# Patient Record
Sex: Male | Born: 1950 | Race: White | Hispanic: No | Marital: Single | Smoking: Never smoker
Health system: Southern US, Community
[De-identification: ages and names within clinical notes are randomized; demographics above are authoritative.]

## PROBLEM LIST (undated history)

## (undated) DIAGNOSIS — E7800 Pure hypercholesterolemia, unspecified: Secondary | ICD-10-CM

## (undated) DIAGNOSIS — K645 Perianal venous thrombosis: Secondary | ICD-10-CM

## (undated) DIAGNOSIS — Z8781 Personal history of (healed) traumatic fracture: Secondary | ICD-10-CM

## (undated) DIAGNOSIS — M810 Age-related osteoporosis without current pathological fracture: Secondary | ICD-10-CM

## (undated) DIAGNOSIS — D126 Benign neoplasm of colon, unspecified: Secondary | ICD-10-CM

## (undated) DIAGNOSIS — N200 Calculus of kidney: Secondary | ICD-10-CM

## (undated) DIAGNOSIS — R011 Cardiac murmur, unspecified: Secondary | ICD-10-CM

## (undated) DIAGNOSIS — H269 Unspecified cataract: Secondary | ICD-10-CM

## (undated) DIAGNOSIS — I509 Heart failure, unspecified: Secondary | ICD-10-CM

## (undated) DIAGNOSIS — I350 Nonrheumatic aortic (valve) stenosis: Secondary | ICD-10-CM

## (undated) DIAGNOSIS — I4510 Unspecified right bundle-branch block: Secondary | ICD-10-CM

## (undated) DIAGNOSIS — E785 Hyperlipidemia, unspecified: Secondary | ICD-10-CM

## (undated) DIAGNOSIS — J40 Bronchitis, not specified as acute or chronic: Secondary | ICD-10-CM

## (undated) DIAGNOSIS — N400 Enlarged prostate without lower urinary tract symptoms: Secondary | ICD-10-CM

## (undated) DIAGNOSIS — K635 Polyp of colon: Secondary | ICD-10-CM

## (undated) DIAGNOSIS — H903 Sensorineural hearing loss, bilateral: Secondary | ICD-10-CM

## (undated) DIAGNOSIS — E559 Vitamin D deficiency, unspecified: Secondary | ICD-10-CM

## (undated) HISTORY — DX: Unspecified cataract: H26.9

## (undated) HISTORY — PX: POLYPECTOMY: SHX149

## (undated) HISTORY — DX: Perianal venous thrombosis: K64.5

## (undated) HISTORY — DX: Nonrheumatic aortic (valve) stenosis: I35.0

## (undated) HISTORY — DX: Pure hypercholesterolemia, unspecified: E78.00

## (undated) HISTORY — DX: Benign neoplasm of colon, unspecified: D12.6

## (undated) HISTORY — DX: Vitamin D deficiency, unspecified: E55.9

## (undated) HISTORY — DX: Hyperlipidemia, unspecified: E78.5

## (undated) HISTORY — PX: TESTICLE SURGERY: SHX794

## (undated) HISTORY — DX: Benign prostatic hyperplasia without lower urinary tract symptoms: N40.0

## (undated) HISTORY — PX: HERNIA REPAIR: SHX51

## (undated) HISTORY — DX: Unspecified right bundle-branch block: I45.10

## (undated) HISTORY — DX: Sensorineural hearing loss, bilateral: H90.3

## (undated) HISTORY — DX: Polyp of colon: K63.5

## (undated) HISTORY — PX: CARDIAC CATHETERIZATION: SHX172

## (undated) HISTORY — DX: Personal history of (healed) traumatic fracture: Z87.81

## (undated) HISTORY — PX: COLONOSCOPY: SHX174

---

## 1989-01-24 DIAGNOSIS — M8580 Other specified disorders of bone density and structure, unspecified site: Secondary | ICD-10-CM | POA: Insufficient documentation

## 2004-03-25 HISTORY — PX: COLON SURGERY: SHX602

## 2012-01-28 ENCOUNTER — Emergency Department (HOSPITAL_BASED_OUTPATIENT_CLINIC_OR_DEPARTMENT_OTHER)
Admission: EM | Admit: 2012-01-28 | Discharge: 2012-01-28 | Disposition: A | Discharge status: Home / Self Care | Payer: PRIVATE HEALTH INSURANCE | Attending: Emergency Medicine | Admitting: Emergency Medicine

## 2012-01-28 ENCOUNTER — Emergency Department (HOSPITAL_BASED_OUTPATIENT_CLINIC_OR_DEPARTMENT_OTHER): Payer: PRIVATE HEALTH INSURANCE

## 2012-01-28 ENCOUNTER — Encounter (HOSPITAL_BASED_OUTPATIENT_CLINIC_OR_DEPARTMENT_OTHER): Payer: Self-pay | Admitting: Emergency Medicine

## 2012-01-28 DIAGNOSIS — Y9389 Activity, other specified: Secondary | ICD-10-CM | POA: Insufficient documentation

## 2012-01-28 DIAGNOSIS — Z87442 Personal history of urinary calculi: Secondary | ICD-10-CM | POA: Insufficient documentation

## 2012-01-28 DIAGNOSIS — Z79899 Other long term (current) drug therapy: Secondary | ICD-10-CM | POA: Insufficient documentation

## 2012-01-28 DIAGNOSIS — Y929 Unspecified place or not applicable: Secondary | ICD-10-CM | POA: Insufficient documentation

## 2012-01-28 DIAGNOSIS — X500XXA Overexertion from strenuous movement or load, initial encounter: Secondary | ICD-10-CM | POA: Insufficient documentation

## 2012-01-28 DIAGNOSIS — IMO0001 Reserved for inherently not codable concepts without codable children: Secondary | ICD-10-CM | POA: Insufficient documentation

## 2012-01-28 DIAGNOSIS — M7918 Myalgia, other site: Secondary | ICD-10-CM

## 2012-01-28 HISTORY — DX: Age-related osteoporosis without current pathological fracture: M81.0

## 2012-01-28 HISTORY — DX: Bronchitis, not specified as acute or chronic: J40

## 2012-01-28 HISTORY — DX: Calculus of kidney: N20.0

## 2012-01-28 LAB — BASIC METABOLIC PANEL
BUN: 16 mg/dL (ref 6–23)
CO2: 30 mEq/L (ref 19–32)
Calcium: 9.4 mg/dL (ref 8.4–10.5)
Chloride: 100 mEq/L (ref 96–112)
Creatinine, Ser: 0.8 mg/dL (ref 0.50–1.35)
GFR calc Af Amer: >90 mL/min (ref >90)
GFR calc non Af Amer: >90 mL/min (ref >90)
Glucose, Bld: 108 mg/dL — ABNORMAL HIGH (ref 70–99)
Potassium: 3.9 mEq/L (ref 3.5–5.1)
Sodium: 138 mEq/L (ref 135–145)

## 2012-01-28 LAB — URINALYSIS, ROUTINE W REFLEX MICROSCOPIC
Bilirubin Urine: NEGATIVE
Glucose, UA: NEGATIVE mg/dL
Hgb urine dipstick: NEGATIVE
Ketones, ur: NEGATIVE mg/dL
Leukocytes, UA: NEGATIVE
Nitrite: NEGATIVE
Protein, ur: NEGATIVE mg/dL
Specific Gravity, Urine: 1.009 (ref 1.005–1.030)
Urobilinogen, UA: 0.2 mg/dL (ref 0.0–1.0)
pH: 6.5 (ref 5.0–8.0)

## 2012-01-28 LAB — CBC WITH DIFFERENTIAL/PLATELET
Basophils Absolute: 0 10*3/uL (ref 0.0–0.1)
Basophils Relative: 0 % (ref 0–1)
Eosinophils Absolute: 0.2 10*3/uL (ref 0.0–0.7)
Eosinophils Relative: 3 % (ref 0–5)
HCT: 38.9 % — ABNORMAL LOW (ref 39.0–52.0)
Hemoglobin: 13.6 g/dL (ref 13.0–17.0)
Lymphocytes Relative: 21 % (ref 12–46)
Lymphs Abs: 1.8 10*3/uL (ref 0.7–4.0)
MCH: 31.3 pg (ref 26.0–34.0)
MCHC: 35 g/dL (ref 30.0–36.0)
MCV: 89.6 fL (ref 78.0–100.0)
Monocytes Absolute: 0.8 10*3/uL (ref 0.1–1.0)
Monocytes Relative: 9 % (ref 3–12)
Neutro Abs: 5.6 10*3/uL (ref 1.7–7.7)
Neutrophils Relative %: 67 % (ref 43–77)
Platelets: 152 10*3/uL (ref 150–400)
RBC: 4.34 MIL/uL (ref 4.22–5.81)
RDW: 13 % (ref 11.5–15.5)
WBC: 8.4 10*3/uL (ref 4.0–10.5)

## 2012-01-28 MED ORDER — HYDROMORPHONE HCL PF 1 MG/ML IJ SOLN
1.0000 mg | Freq: Once | INTRAMUSCULAR | Status: AC
  Administered 2012-01-28: 1 mg via INTRAVENOUS
  Filled 2012-01-28: qty 1

## 2012-01-28 MED ORDER — ONDANSETRON HCL 4 MG/2ML IJ SOLN
4.0000 mg | Freq: Once | INTRAMUSCULAR | Status: AC
  Administered 2012-01-28: 4 mg via INTRAVENOUS
  Filled 2012-01-28: qty 2

## 2012-01-28 MED ORDER — SODIUM CHLORIDE 0.9 % IV BOLUS (SEPSIS)
1000.0000 mL | Freq: Once | INTRAVENOUS | Status: AC
  Administered 2012-01-28: 1000 mL via INTRAVENOUS

## 2012-01-28 MED ORDER — HYDROCODONE-ACETAMINOPHEN 5-500 MG PO TABS: 1.0000 | ORAL_TABLET | Freq: Four times a day (QID) | ORAL | Status: DC | PRN

## 2012-01-28 MED ORDER — CYCLOBENZAPRINE HCL 10 MG PO TABS: 10.0000 mg | ORAL_TABLET | Freq: Two times a day (BID) | ORAL | Status: DC | PRN

## 2012-01-28 NOTE — ED Notes (Signed)
Patient transported to CT 

## 2012-01-28 NOTE — ED Notes (Signed)
MD at bedside. 

## 2012-01-28 NOTE — ED Notes (Signed)
Right flank pain since last night.  Got a little better for a while and then has flared up again.  Hx of kidney stones.  Sts this feels the same.  Has urology appt tomorrow but could not wait.

## 2012-01-28 NOTE — ED Provider Notes (Addendum)
History     CSN: [id number]  Arrival date & time 01/28/12  1625   First MD Initiated Contact with Patient 01/28/12 1640      Chief Complaint  Patient presents with  . Flank Pain    (Consider location/radiation/quality/duration/timing/severity/associated sxs/prior treatment) HPI Comments: Pain is a 7/10  Patient is a 61 y.o. male presenting with flank pain. The history is provided by the patient.  Flank Pain This is a recurrent problem. Episode onset: 10 days ago got some pain when picking up some logs but then severe pain in the right flank last nigth. Episode frequency: intermittent. The problem has not changed since onset.Pertinent negatives include no abdominal pain and no shortness of breath. Associated symptoms comments: No fever, dysuria, change in urine color, or N/V. Nothing aggravates the symptoms. Nothing relieves the symptoms. He has tried nothing for the symptoms. The treatment provided no relief.    Past Medical History  Diagnosis Date  . Kidney stone   . Osteoporosis   . Bronchitis     Past Surgical History  Procedure Date  . Colon surgery   . Hernia repair   . Testicle surgery     No family history on file.  History  Substance Use Topics  . Smoking status: Never Smoker   . Smokeless tobacco: Never Used  . Alcohol Use: Yes     Comment: Moderately      Review of Systems  Constitutional: Negative for fever.  Respiratory: Negative for shortness of breath.   Gastrointestinal: Negative for nausea, vomiting, abdominal pain, diarrhea and constipation.  Genitourinary: Positive for flank pain. Negative for dysuria and testicular pain.  All other systems reviewed and are negative.    Allergies  Review of patient's allergies indicates no known allergies.  Home Medications   Current Outpatient Rx  Name  Route  Sig  Dispense  Refill  . FINASTERIDE 5 MG PO TABS   Oral   Take 1.25 mg by mouth daily.         [name] HYDROCHLOROTHIAZIDE 25 MG PO TABS  Oral   Take 25 mg by mouth daily.           BP 129/73  Pulse 64  Temp 98.1 F (36.7 C) (Oral)  Resp 20  Ht 5\' 9"  (1.753 m)  Wt 156 lb (70.761 kg)  BMI 23.04 kg/m2  SpO2 100%  Physical Exam  Nursing note and vitals reviewed. Constitutional: He is oriented to person, place, and time. He appears well-developed and well-nourished. No distress.  HENT:  Head: Normocephalic and atraumatic.  Mouth/Throat: Oropharynx is clear and moist.  Eyes: Conjunctivae normal and EOM are normal. Pupils are equal, round, and reactive to light.  Neck: Normal range of motion. Neck supple.  Cardiovascular: Normal rate, regular rhythm and intact distal pulses.   No murmur heard. Pulmonary/Chest: Effort normal and breath sounds normal. No respiratory distress. He has no wheezes. He has no rales.  Abdominal: Soft. He exhibits no distension. There is no tenderness. There is CVA tenderness. There is no rebound and no guarding.       Mild right flank pain  Musculoskeletal: Normal range of motion. He exhibits no edema and no tenderness.  Neurological: He is alert and oriented to person, place, and time.  Skin: Skin is warm and dry. No rash noted. No erythema.  Psychiatric: He has a normal mood and affect. His behavior is normal.    ED Course  Procedures (including critical care time)  Labs Reviewed  CBC WITH DIFFERENTIAL - Abnormal; Notable for the following:    HCT 38.9 (*)     All other components within normal limits  BASIC METABOLIC PANEL - Abnormal; Notable for the following:    Glucose, Bld 108 (*)     All other components within normal limits  URINALYSIS, ROUTINE W REFLEX MICROSCOPIC   Ct Abdomen Pelvis Wo Contrast  01/28/2012  *RADIOLOGY REPORT*  Clinical Data: The right sided flank pain.  History of kidney stones.  CT ABDOMEN AND PELVIS WITHOUT CONTRAST  Technique:  Multidetector CT imaging of the abdomen and pelvis was performed following the standard protocol without intravenous contrast.   Comparison: None.  Findings: The lung bases demonstrate bibasilar atelectasis and possible right basilar infiltrate.  No pulmonary nodules or pleural effusion.  The unenhanced appearance of the liver is grossly normal.  No biliary dilatation.  The spleen is normal in size.  No focal lesions.  The pancreas is grossly normal.  No common bile duct dilatation.  The adrenal glands are normal.  There is an upper pole left renal cyst and a lower pole renal calculus.  The right kidney appears normal.  No hydronephrosis.  No obstructing ureteral calculi or bladder calculi.  The stomach, duodenum, small bowel and colon are grossly normal without oral contrast.  There are surgical changes involving the cecum.  No acute inflammatory changes or obvious mass lesion.  The aorta is normal in caliber.  No obvious mesenteric or retroperitoneal mass or adenopathy.  The bladder is normal.  The midline prostate gland calcification is noted.  Cannot exclude the possibility of a calculus in the posterior/prostatic urethra.  Mass-like area in the left lower inguinal canal may represent a varicocele.  Clinical correlation suggested.  The bony structures are unremarkable.  IMPRESSION:  1.  Lower pole left renal calculi but no obstructing ureteral calculus. 2.  Simple appearing left renal cyst. 3.  Central prostate calcification.  Cannot exclude the possibility of a calculus in the prostatic urethra. 4.  Mass in the lower left inguinal canal may be a combination of varicocele and fluid around the spermatic cord   Original Report Authenticated By: Rudie Meyer, M.D.      1. Musculoskeletal pain       MDM   Pt with symptoms consistent with kidney stone however did state 10days ago he was helping lift some logs and has some pain in his right back at that time, but sharp stabbing pain in the right flank started yesterday.  Denies infectious sx, or GI symptoms.  Low concern for diverticulitis and no risk factors or history suggestive of  AAA.  No hx suggestive of GU source (discharge) and otherwise pt is healthy.  Prior hx of stones with retrieval in 95. Will hydrate, treat pain and ensure no infection with UA, CBC, CMP and will get stone study to further eval.   5:50 PM Labs are normal without signs of a kidney stone. On reevaluation of the patient he is feeling better and with further discussion he states that he was splitting wood with a sledgehammer the day prior to the pain starting. Feel this is most likely musculoskeletal and he was treated with supportive care.        Gwyneth Sprout, MD 01/28/12 1751  Gwyneth Sprout, MD 01/28/12 1753

## 2012-09-01 ENCOUNTER — Telehealth: Payer: Self-pay

## 2012-09-01 NOTE — Telephone Encounter (Signed)
Pt states he never received heart monitor in the mail. Please advise

## 2012-09-03 NOTE — Telephone Encounter (Signed)
This is the wrong patient for the monitor the brother needs the monitor. I spoke with the brother Jerry Foster and he is aware that he will receive the monitor in the mail.

## 2012-09-21 ENCOUNTER — Encounter: Payer: PRIVATE HEALTH INSURANCE | Admitting: Surgery

## 2014-06-15 DIAGNOSIS — I4510 Unspecified right bundle-branch block: Secondary | ICD-10-CM | POA: Insufficient documentation

## 2014-06-15 DIAGNOSIS — M810 Age-related osteoporosis without current pathological fracture: Secondary | ICD-10-CM | POA: Insufficient documentation

## 2014-06-15 DIAGNOSIS — E7800 Pure hypercholesterolemia, unspecified: Secondary | ICD-10-CM | POA: Insufficient documentation

## 2014-06-15 DIAGNOSIS — Z8781 Personal history of (healed) traumatic fracture: Secondary | ICD-10-CM | POA: Insufficient documentation

## 2014-06-15 DIAGNOSIS — K635 Polyp of colon: Secondary | ICD-10-CM | POA: Insufficient documentation

## 2014-06-15 DIAGNOSIS — N400 Enlarged prostate without lower urinary tract symptoms: Secondary | ICD-10-CM | POA: Insufficient documentation

## 2014-11-24 DIAGNOSIS — E559 Vitamin D deficiency, unspecified: Secondary | ICD-10-CM | POA: Insufficient documentation

## 2015-06-29 DIAGNOSIS — R011 Cardiac murmur, unspecified: Secondary | ICD-10-CM | POA: Insufficient documentation

## 2016-07-31 ENCOUNTER — Encounter: Payer: Self-pay | Admitting: Family Medicine

## 2017-02-04 ENCOUNTER — Telehealth: Payer: Self-pay | Admitting: Gastroenterology

## 2017-02-04 NOTE — Telephone Encounter (Signed)
Received referral for patient to have next colon at office. Patient last colon 2012 in [facility]. Patient wanting to switch due to moving to [place], and not requesting certain doctor. Previous colon report placed on DOD for 11.1.18; Dr.Armbruster's desk for review.

## 2017-02-06 ENCOUNTER — Encounter: Payer: Self-pay | Admitting: Gastroenterology

## 2017-02-06 NOTE — Telephone Encounter (Signed)
Dr. Havery Moros reviewed records and has accepted patient. Ok to schedule Direct Colon. Colonoscopy scheduled.

## 2017-03-12 ENCOUNTER — Other Ambulatory Visit: Payer: Self-pay

## 2017-03-12 ENCOUNTER — Ambulatory Visit (AMBULATORY_SURGERY_CENTER): Payer: Self-pay | Admitting: *Deleted

## 2017-03-12 VITALS — Ht 69.5 in | Wt 162.0 lb

## 2017-03-12 DIAGNOSIS — Z860100 Personal history of colon polyps, unspecified: Secondary | ICD-10-CM

## 2017-03-12 NOTE — Progress Notes (Signed)
No egg or soy allergy known to patient  No issues with past sedation with any surgeries  or procedures, no intubation problems  No diet pills per patient No home 02 use per patient  No blood thinners per patient  Pt denies issues with constipation  No A fib or A flutter  EMMI video sent to pt's e mail pt declined   

## 2017-03-31 ENCOUNTER — Other Ambulatory Visit: Payer: Self-pay

## 2017-03-31 ENCOUNTER — Encounter: Payer: Self-pay | Admitting: Gastroenterology

## 2017-03-31 ENCOUNTER — Ambulatory Visit (AMBULATORY_SURGERY_CENTER): Payer: Medicare Other | Admitting: Gastroenterology

## 2017-03-31 VITALS — BP 106/68 | HR 63 | Temp 97.3°F | Resp 11 | Ht 69.0 in | Wt 162.0 lb

## 2017-03-31 DIAGNOSIS — D123 Benign neoplasm of transverse colon: Secondary | ICD-10-CM | POA: Diagnosis not present

## 2017-03-31 DIAGNOSIS — Z86010 Personal history of colonic polyps: Secondary | ICD-10-CM | POA: Diagnosis not present

## 2017-03-31 DIAGNOSIS — D122 Benign neoplasm of ascending colon: Secondary | ICD-10-CM | POA: Diagnosis not present

## 2017-03-31 MED ORDER — SODIUM CHLORIDE 0.9 % IV SOLN: 500.0000 mL | Freq: Once | INTRAVENOUS | Status: DC

## 2017-03-31 NOTE — Op Note (Signed)
[facility] Patient Name: Jerry Foster Procedure Date: 03/31/2017 1:12 PM MRN: [id number] Endoscopist: Remo Lipps P. Armbruster MD, MD Age: 67 Referring MD:  Date of Birth: 1951-01-27 Gender: Male Account #: [id number] Procedure:                Colonoscopy Indications:              High risk colon cancer surveillance: Personal                            history of colonic polyps (large advanced right                            sided villous adenoma s/p surgical resection Medicines:                Monitored Anesthesia Care Procedure:                Pre-Anesthesia Assessment:                           - Prior to the procedure, a History and Physical                            was performed, and patient medications and                            allergies were reviewed. The patient's tolerance of                            previous anesthesia was also reviewed. The risks                            and benefits of the procedure and the sedation                            options and risks were discussed with the patient.                            All questions were answered, and informed consent                            was obtained. Prior Anticoagulants: The patient has                            taken no previous anticoagulant or antiplatelet                            agents. ASA Grade Assessment: II - A patient with                            mild systemic disease. After reviewing the risks                            and benefits, the patient was deemed in  satisfactory condition to undergo the procedure.                           After obtaining informed consent, the colonoscope                            was passed under direct vision. Throughout the                            procedure, the patient's blood pressure, pulse, and                            oxygen saturations were monitored continuously. The                            Colonoscope was  introduced through the anus and                            advanced to the the ileocolonic anastomosis. The                            colonoscopy was technically difficult and complex                            due to a tortuous colon. The patient tolerated the                            procedure well. The quality of the bowel                            preparation was adequate. The rectum, ileocolonic                            anastomosis were photographed. Scope In: 1:24:21 PM Scope Out: 1:57:12 PM Scope Withdrawal Time: 0 hours 26 minutes 52 seconds  Total Procedure Duration: 0 hours 32 minutes 51 seconds  Findings:                 The perianal and digital rectal examinations were                            normal.                           There was evidence of a prior end-to-side                            ileo-colonic anastomosis in the ascending colon.                            This was patent and was characterized by healthy                            appearing mucosa.  A 3 mm polyp was found in the ascending colon. The                            polyp was sessile. The polyp was removed with a                            cold snare. Resection and retrieval were complete.                           Two sessile polyps were found in the distal                            transverse colon. The polyps were 3 to 5 mm in                            size. The largest was located behind a fold and was                            technically challenging to grasp. These polyps were                            removed with a cold snare. Resection and retrieval                            were complete.                           The colon was tortuous.                           Internal hemorrhoids were found during                            retroflexion. The hemorrhoids were moderate.                           The exam was otherwise without abnormality. Complications:             No immediate complications. Estimated blood loss:                            Minimal. Estimated Blood Loss:     Estimated blood loss was minimal. Estimated blood                            loss was minimal. Impression:               - Patent end-to-side ileo-colonic anastomosis,                            characterized by healthy appearing mucosa.                           - One 3 mm polyp in the ascending colon, removed  with a cold snare. Resected and retrieved.                           - Two 3 to 5 mm polyps in the transverse colon,                            removed with a cold snare. Resected and retrieved.                           - Tortuous colon.                           - Internal hemorrhoids.                           - The examination was otherwise normal. Recommendation:           - Patient has a contact number available for                            emergencies. The signs and symptoms of potential                            delayed complications were discussed with the                            patient. Return to normal activities tomorrow.                            Written discharge instructions were provided to the                            patient.                           - Resume previous diet.                           - Continue present medications.                           - Await pathology results.                           - Repeat colonoscopy is recommended for                            surveillance. The colonoscopy date will be                            determined after pathology results from today's                            exam become available for review.                           - No ibuprofen, naproxen,  or other non-steroidal                            anti-inflammatory drugs for 2 weeks after polyp                            removal. Remo Lipps P. Armbruster MD, MD 03/31/2017 2:02:34 PM This report has been  signed electronically.

## 2017-03-31 NOTE — Progress Notes (Signed)
To PACU, VSS. Report to [email or web address] 

## 2017-03-31 NOTE — Progress Notes (Signed)
Pt's states no medical or surgical changes since previsit or office visit. 

## 2017-03-31 NOTE — Patient Instructions (Signed)
Information given on polyps and hemorrhoids.   YOU HAD AN ENDOSCOPIC PROCEDURE TODAY AT [facility]:   Refer to the procedure report that was given to you for any specific questions about what was found during the examination.  If the procedure report does not answer your questions, please call your gastroenterologist to clarify.  If you requested that your care partner not be given the details of your procedure findings, then the procedure report has been included in a sealed envelope for you to review at your convenience later.  YOU SHOULD EXPECT: Some feelings of bloating in the abdomen. Passage of more gas than usual.  Walking can help get rid of the air that was put into your GI tract during the procedure and reduce the bloating. If you had a lower endoscopy (such as a colonoscopy or flexible sigmoidoscopy) you may notice spotting of blood in your stool or on the toilet paper. If you underwent a bowel prep for your procedure, you may not have a normal bowel movement for a few days.  Please Note:  You might notice some irritation and congestion in your nose or some drainage.  This is from the oxygen used during your procedure.  There is no need for concern and it should clear up in a day or so.  SYMPTOMS TO REPORT IMMEDIATELY:   Following lower endoscopy (colonoscopy or flexible sigmoidoscopy):  Excessive amounts of blood in the stool  Significant tenderness or worsening of abdominal pains  Swelling of the abdomen that is new, acute  Fever of 100F or higher   Following upper endoscopy (EGD)  Vomiting of blood or coffee ground material  New chest pain or pain under the shoulder blades  Painful or persistently difficult swallowing  New shortness of breath  Fever of 100F or higher  Black, tarry-looking stools  For urgent or emergent issues, a gastroenterologist can be reached at any hour by calling [phone].   DIET:  We do recommend a small meal at first, but  then you may proceed to your regular diet.  Drink plenty of fluids but you should avoid alcoholic beverages for 24 hours.  ACTIVITY:  You should plan to take it easy for the rest of today and you should NOT DRIVE or use heavy machinery until tomorrow (because of the sedation medicines used during the test).    FOLLOW UP: Our staff will call the number listed on your records the next business day following your procedure to check on you and address any questions or concerns that you may have regarding the information given to you following your procedure. If we do not reach you, we will leave a message.  However, if you are feeling well and you are not experiencing any problems, there is no need to return our call.  We will assume that you have returned to your regular daily activities without incident.  If any biopsies were taken you will be contacted by phone or by letter within the next 1-3 weeks.  Please call us at [phone] if you have not heard about the biopsies in 3 weeks.    SIGNATURES/CONFIDENTIALITY: You and/or your care partner have signed paperwork which will be entered into your electronic medical record.  These signatures attest to the fact that that the information above on your After Visit Summary has been reviewed and is understood.  Full responsibility of the confidentiality of this discharge information lies with you and/or your care-partner.

## 2017-03-31 NOTE — Progress Notes (Signed)
Called to room to assist during endoscopic procedure.  Patient ID and intended procedure confirmed with present staff. Received instructions for my participation in the procedure from the performing physician.  

## 2017-04-01 ENCOUNTER — Telehealth: Payer: Self-pay | Admitting: *Deleted

## 2017-04-01 NOTE — Telephone Encounter (Signed)
  Follow up Call-  Call back number 03/31/2017  Post procedure Call Back phone  # [phone]  Permission to leave phone message Yes  Some recent data might be hidden     Patient questions:  Do you have a fever, pain , or abdominal swelling? No. Pain Score  0 *  Have you tolerated food without any problems? Yes.    Have you been able to return to your normal activities? Yes.    Do you have any questions about your discharge instructions: Diet   No. Medications  No. Follow up visit  No.  Do you have questions or concerns about your Care? No.  Actions: * If pain score is 4 or above: No action needed, pain <4.

## 2017-04-01 NOTE — Telephone Encounter (Signed)
  Follow up Call-  Call back number 03/31/2017  Post procedure Call Back phone  # [phone]  Permission to leave phone message Yes  Some recent data might be hidden     Patient questions:  Do you have a fever, pain , or abdominal swelling? No. Pain Score  0 *  Have you tolerated food without any problems? Yes.    Have you been able to return to your normal activities? No.  Do you have any questions about your discharge instructions: Diet   No. Medications  No. Follow up visit  No.  Do you have questions or concerns about your Care? No.  Actions: * If pain score is 4 or above: No action needed, pain <4.

## 2017-04-04 ENCOUNTER — Encounter: Payer: Self-pay | Admitting: Gastroenterology

## 2018-01-08 DIAGNOSIS — H903 Sensorineural hearing loss, bilateral: Secondary | ICD-10-CM | POA: Insufficient documentation

## 2018-07-28 ENCOUNTER — Other Ambulatory Visit: Payer: Self-pay

## 2018-07-28 ENCOUNTER — Emergency Department (HOSPITAL_COMMUNITY)
Admission: EM | Admit: 2018-07-28 | Discharge: 2018-07-28 | Disposition: A | Discharge status: Home / Self Care | Payer: Medicare Other | Attending: Emergency Medicine | Admitting: Emergency Medicine

## 2018-07-28 ENCOUNTER — Encounter (HOSPITAL_COMMUNITY): Payer: Self-pay

## 2018-07-28 DIAGNOSIS — S8992XA Unspecified injury of left lower leg, initial encounter: Secondary | ICD-10-CM | POA: Diagnosis present

## 2018-07-28 DIAGNOSIS — Y999 Unspecified external cause status: Secondary | ICD-10-CM | POA: Diagnosis not present

## 2018-07-28 DIAGNOSIS — S80211A Abrasion, right knee, initial encounter: Secondary | ICD-10-CM | POA: Insufficient documentation

## 2018-07-28 DIAGNOSIS — Z79899 Other long term (current) drug therapy: Secondary | ICD-10-CM | POA: Insufficient documentation

## 2018-07-28 DIAGNOSIS — Y929 Unspecified place or not applicable: Secondary | ICD-10-CM | POA: Diagnosis not present

## 2018-07-28 DIAGNOSIS — S81812A Laceration without foreign body, left lower leg, initial encounter: Secondary | ICD-10-CM | POA: Diagnosis not present

## 2018-07-28 DIAGNOSIS — Y9301 Activity, walking, marching and hiking: Secondary | ICD-10-CM | POA: Diagnosis not present

## 2018-07-28 DIAGNOSIS — S80212A Abrasion, left knee, initial encounter: Secondary | ICD-10-CM | POA: Insufficient documentation

## 2018-07-28 DIAGNOSIS — W010XXA Fall on same level from slipping, tripping and stumbling without subsequent striking against object, initial encounter: Secondary | ICD-10-CM | POA: Insufficient documentation

## 2018-07-28 DIAGNOSIS — T07XXXA Unspecified multiple injuries, initial encounter: Secondary | ICD-10-CM

## 2018-07-28 MED ORDER — LIDOCAINE HCL (PF) 1 % IJ SOLN
5.0000 mL | Freq: Once | INTRAMUSCULAR | Status: AC
  Administered 2018-07-28: 5 mL
  Filled 2018-07-28: qty 30

## 2018-07-28 MED ORDER — BACITRACIN ZINC 500 UNIT/GM EX OINT
TOPICAL_OINTMENT | CUTANEOUS | Status: AC
  Administered 2018-07-28: 1
  Filled 2018-07-28: qty 0.9

## 2018-07-28 NOTE — Discharge Instructions (Addendum)
Suture removal in 10 days

## 2018-07-28 NOTE — ED Provider Notes (Signed)
[facility] DEPT Provider Note   CSN: [id number] Arrival date & time: 07/28/18  1800    History   Chief Complaint Chief Complaint  Patient presents with  . Laceration    HPI Jerry Foster is a 68 y.o. male.     Patient was walking across the top of a retaining wall when he slipped. He has an abrasion to the right knee, along with an abrasion to the left fknee and shin. Laceration present  Mid shin. Tetanus up to date.  The history is provided by the patient.  Laceration  Location:  Leg Length:  4 cm Depth:  Cutaneous Quality: jagged   Bleeding: controlled   Time since incident:  3 hours Laceration mechanism:  Fall Pain details:    Quality:  Dull   Severity:  Mild Foreign body present:  No foreign bodies Tetanus status:  Up to date   Past Medical History:  Diagnosis Date  . Aortic stenosis   . Bronchitis   . Cataract    bilateral   . Hyperlipidemia   . Kidney stone   . Osteoporosis   . Thrombosed hemorrhoids    hsitory of  . Tubular adenoma of colon     There are no active problems to display for this patient.   Past Surgical History:  Procedure Laterality Date  . COLON SURGERY  2006   1 foot ascending colon removed   . COLONOSCOPY    . HERNIA REPAIR    . POLYPECTOMY    . TESTICLE SURGERY          Home Medications    Prior to Admission medications   Medication Sig Start Date End Date Taking? Authorizing Provider  calcium-vitamin D 250-100 MG-UNIT per tablet Take 2 tablets by mouth 2 (two) times daily.    [provider]  Cholecalciferol (VITAMIN D3) 1000 units CAPS Take by mouth.    [provider]  finasteride (PROSCAR) 5 MG tablet Take 1.25 mg by mouth daily.    [provider]  Multiple Vitamin (MULTIVITAMIN) tablet Take 1 tablet by mouth daily.    [provider]  pravastatin (PRAVACHOL) 40 MG tablet Take 40 mg by mouth. 06/18/16   [provider]    Family History  Family History  Problem Relation Age of Onset  . Colon cancer Neg Hx   . Colon polyps Neg Hx   . Esophageal cancer Neg Hx   . Rectal cancer Neg Hx   . Stomach cancer Neg Hx     Social History Social History   Tobacco Use  . Smoking status: Never Smoker  . Smokeless tobacco: Never Used  Substance Use Topics  . Alcohol use: Yes    Comment: Moderately  . Drug use: No     Allergies   Terbinafine hcl   Review of Systems Review of Systems  All other systems reviewed and are negative.    Physical Exam Updated Vital Signs BP 134/76 (BP Location: Left Arm)   Pulse 75   Temp 99 F (37.2 C) (Oral)   Resp 14   Ht 5' 9.5" (1.765 m)   Wt 73 kg   SpO2 98%   BMI 23.43 kg/m   Physical Exam Vitals signs and nursing note reviewed.  Constitutional:      General: He is not in acute distress. HENT:     Head: Atraumatic.  Eyes:     Conjunctiva/sclera: Conjunctivae normal.  Cardiovascular:     Rate and Rhythm:  Normal rate.  Pulmonary:     Effort: Pulmonary effort is normal.  Musculoskeletal: Normal range of motion.        General: Signs of injury present.  Skin:    General: Skin is warm and dry.  Neurological:     Mental Status: He is alert and oriented to person, place, and time.  Psychiatric:        Mood and Affect: Mood normal.      ED Treatments / Results  Labs (all labs ordered are listed, but only abnormal results are displayed) Labs Reviewed - No data to display  EKG None  Radiology No results found.  Procedures .Marland KitchenLaceration Repair Date/Time: 07/28/2018 8:17 PM Performed by: Etta Quill, NP Authorized by: Etta Quill, NP   Consent:    Consent obtained:  Verbal   Consent given by:  Patient   Risks discussed:  Infection and poor cosmetic result Anesthesia (see MAR for exact dosages):    Anesthesia method:  Local infiltration   Local anesthetic:  Lidocaine 1% w/o epi Laceration details:    Location:  Leg   Leg location:  L upper leg    Length (cm):  4 Repair type:    Repair type:  Simple Pre-procedure details:    Preparation:  Patient was prepped and draped in usual sterile fashion Exploration:    Hemostasis achieved with:  Direct pressure   Wound exploration: entire depth of wound probed and visualized     Contaminated: no   Treatment:    Area cleansed with:  Betadine and saline   Amount of cleaning:  Standard   Irrigation solution:  Sterile saline   Irrigation method:  Syringe Skin repair:    Repair method:  Sutures   Suture size:  4-0   Suture material:  Prolene   Suture technique:  Simple interrupted   Number of sutures:  5 Post-procedure details:    Dressing:  Non-adherent dressing   Patient tolerance of procedure:  Tolerated well, no immediate complications   (including critical care time)  Medications Ordered in ED Medications  lidocaine (PF) (XYLOCAINE) 1 % injection 5 mL (has no administration in time range)     Initial Impression / Assessment and Plan / ED Course  I have reviewed the triage vital signs and the nursing notes.  Pertinent labs & imaging results that were available during my care of the patient were reviewed by me and considered in my medical decision making (see chart for details).        Tetanus UTD. Laceration occurred < 12 hours prior to repair. Discussed laceration care with pt and answered questions. Pt to f-u for suture removal in 10 days and wound check sooner should there be signs of dehiscence or infection. Pt is hemodynamically stable with no complaints prior to dc.    Final Clinical Impressions(s) / ED Diagnoses   Final diagnoses:  Laceration of left lower extremity, initial encounter  Abrasions of multiple sites    ED Discharge Orders    None       Etta Quill, NP 07/28/18 [id number]    Pattricia Boss, MD 07/29/18 1221

## 2018-07-28 NOTE — ED Notes (Signed)
Patient has a laceration on left lower leg.

## 2018-07-28 NOTE — ED Triage Notes (Signed)
Pt states he was walking on top of retaining wall, which has steps. Pt states he wasn't paying attention and tripped, cutting his left lower leg. Pt wrapped leg PTA, but states there is approximately a 3 inch lac on left lower leg.

## 2018-07-28 NOTE — ED Notes (Signed)
Bed: WTR5 Expected date:  Expected time:  Means of arrival:  Comments: 

## 2018-12-07 ENCOUNTER — Other Ambulatory Visit: Payer: Self-pay

## 2018-12-07 DIAGNOSIS — Z20822 Contact with and (suspected) exposure to covid-19: Secondary | ICD-10-CM

## 2018-12-08 LAB — NOVEL CORONAVIRUS, NAA

## 2018-12-09 ENCOUNTER — Other Ambulatory Visit: Payer: Self-pay

## 2018-12-09 DIAGNOSIS — Z20822 Contact with and (suspected) exposure to covid-19: Secondary | ICD-10-CM

## 2018-12-10 LAB — NOVEL CORONAVIRUS, NAA: SARS-CoV-2, NAA: NOT DETECTED

## 2019-04-15 ENCOUNTER — Ambulatory Visit: Payer: Medicare Other | Attending: Internal Medicine

## 2019-04-15 DIAGNOSIS — Z23 Encounter for immunization: Secondary | ICD-10-CM

## 2019-04-15 NOTE — Progress Notes (Signed)
   Covid-19 Vaccination Clinic  Name:  Aaidan Render    MRN: [id number] DOB: 1950-11-15  04/15/2019  Mr. Berrien was observed post Covid-19 immunization for 15 minutes without incidence. He was provided with Vaccine Information Sheet and instruction to access the V-Safe system.   Mr. Aldridge was instructed to call 911 with any severe reactions post vaccine: [name] Difficulty breathing  . Swelling of your face and throat  . A fast heartbeat  . A bad rash all over your body  . Dizziness and weakness    Immunizations Administered    Name Date Dose VIS Date Route   Pfizer COVID-19 Vaccine 04/15/2019  4:32 PM 0.3 mL 03/05/2019 Intramuscular   Manufacturer: [place]   Lot: [id number]   [place]: [id number]

## 2019-05-07 ENCOUNTER — Ambulatory Visit: Payer: Medicare Other | Attending: Internal Medicine

## 2019-05-07 DIAGNOSIS — Z23 Encounter for immunization: Secondary | ICD-10-CM | POA: Insufficient documentation

## 2019-05-07 NOTE — Progress Notes (Signed)
   Covid-19 Vaccination Clinic  Name:  Jerry Foster    MRN: [id number] DOB: April 24, 1950  05/07/2019  [name] was observed post Covid-19 immunization for 15 minutes without incidence. He was provided with Vaccine Information Sheet and instruction to access the V-Safe system.   [name] was instructed to call 911 with any severe reactions post vaccine: [name] Difficulty breathing  . Swelling of your face and throat  . A fast heartbeat  . A bad rash all over your body  . Dizziness and weakness    Immunizations Administered    Name Date Dose VIS Date Route   Pfizer COVID-19 Vaccine 05/07/2019  8:33 AM 0.3 mL 03/05/2019 Intramuscular   Manufacturer: [place]   Lot: [id number]   [place]: [id number]

## 2019-05-14 ENCOUNTER — Telehealth: Payer: Self-pay

## 2019-05-14 NOTE — Telephone Encounter (Signed)
NOTES ON FILE FROM [place] [phone], SENT REFERRAL TO [facility]

## 2019-05-14 NOTE — Telephone Encounter (Signed)
NOTES ON [place] [phone], SENT REFERRAL TO SCHEDULING

## 2019-05-28 ENCOUNTER — Telehealth: Payer: Self-pay | Admitting: Cardiovascular Disease

## 2019-05-28 NOTE — Telephone Encounter (Signed)
Cd of echo dropped off by patient. Placed in Dr. Antionette Char box. 05/28/19 vlm

## 2019-06-01 ENCOUNTER — Other Ambulatory Visit: Payer: Self-pay

## 2019-06-01 ENCOUNTER — Ambulatory Visit
Admission: RE | Admit: 2019-06-01 | Discharge: 2019-06-01 | Disposition: A | Discharge status: Home / Self Care | Payer: Self-pay | Source: Ambulatory Visit | Attending: Cardiovascular Disease | Admitting: Cardiovascular Disease

## 2019-06-01 DIAGNOSIS — I350 Nonrheumatic aortic (valve) stenosis: Secondary | ICD-10-CM

## 2019-06-04 ENCOUNTER — Other Ambulatory Visit: Payer: Self-pay

## 2019-06-04 ENCOUNTER — Encounter: Payer: Self-pay | Admitting: Cardiovascular Disease

## 2019-06-04 ENCOUNTER — Ambulatory Visit: Payer: Medicare Other | Admitting: Cardiovascular Disease

## 2019-06-04 VITALS — BP 114/80 | HR 68 | Ht 69.5 in | Wt 156.2 lb

## 2019-06-04 DIAGNOSIS — I350 Nonrheumatic aortic (valve) stenosis: Secondary | ICD-10-CM

## 2019-06-04 NOTE — Progress Notes (Signed)
Cardiology Office Note:    Date:  06/04/2019   ID:  Jerry Foster, DOB [birth year], MRN [id number]  PCP:  Jefm Petty, MD  Cardiologist:  No primary care provider on file.  Electrophysiologist:  None   Referring MD: Jefm Petty, MD   Chief Complaint  Patient presents with  . Dizziness    History of Present Illness:    Jerry Foster is a 69 y.o. male referred by Dr Jeralene Huff for second opinion regarding aortic stenosis. He was recently evaluated in the [facility] system by Dr Andria Frames who recommended aortic valve surgery or TAVR over the next 6-12 months.  He follows regularly with Dr Jeralene Huff. He first remembers being told of a murmur about 10-12 years ago and has had periodic echo studies demonstrating aortic stenosis.  In 2018, an echocardiogram showed an LVEF of 65 to 70% with pseudonormal LV filling and mild LVH.  He was felt to have moderate aortic stenosis with peak and mean transvalvular gradients of 41 and 23 mmHg, respectively.  Aortic valve area calculated to 1.0 cm and his dimensionless index was 0.37.  He had a follow-up echocardiogram in 2020, again showing normal LV systolic function with mild LVH.  The LVEF was estimated at 55 to 60% and peak and mean transaortic gradients were 71 and 43 mmHg, respectively with a dimensionless index of 0.24.  The patient has previously been asymptomatic.  However, he recently had an episode of exertional near syncope when he went jogging with leg weights on.  Symptoms resolved slowly with rest.  He did not have any chest pain or pressure.  He did not have dyspnea with exertion.  However, he notes over the last 6 to 12 months he has developed some exercise intolerance and is not able to do his much hard work as he could in the past.  He denies heart palpitations, orthopnea, or PND.  Past Medical History:  Diagnosis Date  . Aortic stenosis   . Benign prostatic hyperplasia   . Bronchitis   . Cataract    bilateral   . Colon polyps     . History of fracture of vertebral column   . Hyperlipidemia   . Kidney stone   . Nonrheumatic aortic valve stenosis   . Osteoporosis   . Osteoporosis   . Pure hypercholesterolemia   . RBBB   . Sensorineural hearing loss of both ears   . Thrombosed hemorrhoids    hsitory of  . Tubular adenoma of colon   . Vitamin D deficiency     Past Surgical History:  Procedure Laterality Date  . COLON SURGERY  2006   1 foot ascending colon removed   . COLONOSCOPY    . HERNIA REPAIR    . POLYPECTOMY    . TESTICLE SURGERY      Current Medications: Current Meds  Medication Sig  . calcium-vitamin D 250-100 MG-UNIT per tablet Take 2 tablets by mouth 2 (two) times daily.  . Cholecalciferol (VITAMIN D3) 1000 units CAPS Take by mouth.  . Magnesium 200 MG TABS Take 1 tablet by mouth daily.  . Multiple Vitamin (MULTIVITAMIN) tablet Take 1 tablet by mouth daily.  . Omega-3 1000 MG CAPS Take 1 g by mouth daily.  . pravastatin (PRAVACHOL) 40 MG tablet Take 40 mg by mouth.   Current Facility-Administered Medications for the 06/04/19 encounter (Office Visit) with Sherren Mocha, MD  Medication  . 0.9 %  sodium chloride infusion     Allergies:   Terbinafine  hcl   Social History   Socioeconomic History  . Marital status: Single    Spouse name: Not on file  . Number of children: Not on file  . Years of education: Not on file  . Highest education level: Not on file  Occupational History  . Not on file  Tobacco Use  . Smoking status: Never Smoker  . Smokeless tobacco: Never Used  Substance and Sexual Activity  . Alcohol use: Yes    Comment: Moderately  . Drug use: No  . Sexual activity: Not on file  Other Topics Concern  . Not on file  Social History Narrative  . Not on file   Social Determinants of Health   Financial Resource Strain:   . Difficulty of Paying Living Expenses:   Food Insecurity:   . Worried About Charity fundraiser in the Last Year:   . Arboriculturist in the  Last Year:   Transportation Needs:   . Film/video editor (Medical):   [name] Lack of Transportation (Non-Medical):   Physical Activity:   . Days of Exercise per Week:   . Minutes of Exercise per Session:   Stress:   . Feeling of Stress :   Social Connections:   . Frequency of Communication with Friends and Family:   . Frequency of Social Gatherings with Friends and Family:   . Attends Religious Services:   . Active Member of Clubs or Organizations:   . Attends Archivist Meetings:   [name] Marital Status:      Family History: The patient's family history is negative for Colon cancer, Colon polyps, Esophageal cancer, Rectal cancer, and Stomach cancer.  His mother died at 9, no autopsy, chest pain prior to her death  ROS:   Please see the history of present illness.    All other systems reviewed and are negative.  EKGs/Labs/Other Studies Reviewed:    The following studies were reviewed today: 2D echocardiogram is reviewed with findings outlined in the HPI.  I personally reviewed the images which demonstrate a calcified restricted aortic valve.  I am not able to tell whether the valve is bicuspid or tricuspid.  EKG:  EKG is ordered today.  The ekg ordered today demonstrates normal sinus rhythm with right bundle branch block  Recent Labs: No results found for requested labs within last 8760 hours.  Recent Lipid Panel No results found for: CHOL, TRIG, HDL, CHOLHDL, VLDL, LDLCALC, LDLDIRECT  Physical Exam:    VS:  BP 114/80   Pulse 68   Ht 5' 9.5" (1.765 m)   Wt 156 lb 3.2 oz (70.9 kg)   SpO2 97%   BMI 22.74 kg/m     Wt Readings from Last 3 Encounters:  06/04/19 156 lb 3.2 oz (70.9 kg)  07/28/18 161 lb (73 kg)  03/31/17 162 lb (73.5 kg)     GEN:  Well nourished, well developed in no acute distress HEENT: Normal NECK: No JVD; No carotid bruits LYMPHATICS: No lymphadenopathy CARDIAC: RRR, 3/6 harsh late peaking systolic murmur with absent A2 at the right upper  sternal border, no diastolic murmur RESPIRATORY:  Clear to auscultation without rales, wheezing or rhonchi  ABDOMEN: Soft, non-tender, non-distended MUSCULOSKELETAL:  No edema; No deformity  SKIN: Warm and dry NEUROLOGIC:  Alert and oriented x 3 PSYCHIATRIC:  Normal affect   STS RIsk - Isolated AVR: Risk of Mortality: 0.790% Renal Failure: 0.884% Permanent Stroke: 1.284% Prolonged Ventilation: 2.531% DSW Infection: 0.047% Reoperation: 3.622% Morbidity  or Mortality: 5.431% Short Length of Stay: 61.839% Long Length of Stay: 1.999%  ASSESSMENT:    1. Nonrheumatic aortic valve stenosis    PLAN:    In order of problems listed above:  1. The patient has severe, stage D1 aortic stenosis with recent episode of exertional lightheadedness/near syncope. I have reviewed the natural history of aortic stenosis with the patient today. We have discussed the limitations of medical therapy and the poor prognosis associated with symptomatic aortic stenosis. We have reviewed potential treatment options, including palliative medical therapy, conventional surgical aortic valve replacement, and transcatheter aortic valve replacement. We discussed treatment options in the context of the patient's specific comorbid medical conditions.  His exam and echo are both consistent with severe aortic stenosis.  The presence of symptoms are clearly an indication to move forward with further evaluation and consideration of aortic valve replacement.  I have recommended right and left heart catheterization as well as CT angiography studies of both the heart and the chest, abdomen, and pelvis. I have reviewed the risks, indications, and alternatives to cardiac catheterization, possible angioplasty, and stenting with the patient. Risks include but are not limited to bleeding, infection, vascular injury, stroke, myocardial infection, arrhythmia, kidney injury, radiation-related injury in the case of prolonged fluoroscopy  use, emergency cardiac surgery, and death. The patient understands the risks of serious complication is 1-2 in [id number] with diagnostic cardiac cath and 1-2% or less with angioplasty/stenting.  After his studies are completed, I will refer him to cardiac surgery as part of a multidisciplinary approach to his care.  We had specific discussion about TAVR in a low risk population today.  I demonstrated the TAVR procedural animation and explained the typical benefit and expected recovery.  We contrasted this with conventional surgery.  Considering this patient's relatively young age and very good health, surgical aortic valve replacement might offer him benefit of lower pacemaker risk in the setting of his right bundle branch block and better valve longevity with lower risk of paravalvular regurgitation and valve degeneration.  The patient is specifically interested in a minimally invasive surgical approach if possible.   Medication Adjustments/Labs and Tests Ordered: Current medicines are reviewed at length with the patient today.  Concerns regarding medicines are outlined above.  Orders Placed This Encounter  Procedures  . Basic metabolic panel  . CBC with Differential/Platelet  . EKG 12-Lead   No orders of the defined types were placed in this encounter.   Patient Instructions  COVID SCREENING INFORMATION (3/15): You are scheduled for your COVID screening on: 06/07/2019 at 8:10AM. [facility] (old [facility]) [address] Stay in the RIGHT lane and proceed under the brick awning and tell them you are there for pre-procedure testing Do NOT bring any pets with you to the testing site  CATHETERIZATION INSTRUCTIONS (3/16): You are scheduled for a Cardiac Catheterization on: Tuesday, June 08, 2019.  1. Please arrive at the [facility] (Main Entrance A) at [facility]: [place], [place] [address] at: 9:30AM.  (This time is two hours before your  procedure to ensure your preparation). Free valet parking service is available. You are allowed ONE visitor in the waiting room during your procedure. Both you and your visitor must wear masks. Special note: Every effort is made to have your procedure done on time. Please understand that emergencies sometimes delay scheduled procedures.  2. Diet: Do not eat solid foods after midnight.  You may have clear liquids  until 5am upon the day of the procedure.  3. Labs: TODAY! CBC, BMET  4. Medication instructions in preparation for your procedure:  1) TAKE ASPIRIN 81 mg the morning of your procedure  2) Otherwise, you may take your medications as directed  5. Plan for one night stay--bring personal belongings. 6. Bring a current list of your medications and current insurance cards. 7. You MUST have a responsible person to drive you home. 8. Someone MUST be with you the first 24 hours after you arrive home or your discharge will be delayed. 9. Please wear clothes that are easy to get on and off and wear slip-on shoes.  Thank you for allowing [place] to care for you!   -- [facility] Invasive Cardiovascular services    Signed, Sherren Mocha, MD  06/04/2019 12:42 PM    [place]

## 2019-06-04 NOTE — H&P (View-Only) (Signed)
Cardiology Office Note:    Date:  06/04/2019   ID:  Jerry Foster, DOB [birth year], MRN [id number]  PCP:  Jefm Petty, MD  Cardiologist:  No primary care provider on file.  Electrophysiologist:  None   Referring MD: Jefm Petty, MD   Chief Complaint  Patient presents with  . Dizziness    History of Present Illness:    Jerry Foster is a 69 y.o. male referred by Dr Jeralene Huff for second opinion regarding aortic stenosis. He was recently evaluated in the [facility] system by Dr Andria Frames who recommended aortic valve surgery or TAVR over the next 6-12 months.  He follows regularly with Dr Jeralene Huff. He first remembers being told of a murmur about 10-12 years ago and has had periodic echo studies demonstrating aortic stenosis.  In 2018, an echocardiogram showed an LVEF of 65 to 70% with pseudonormal LV filling and mild LVH.  He was felt to have moderate aortic stenosis with peak and mean transvalvular gradients of 41 and 23 mmHg, respectively.  Aortic valve area calculated to 1.0 cm and his dimensionless index was 0.37.  He had a follow-up echocardiogram in 2020, again showing normal LV systolic function with mild LVH.  The LVEF was estimated at 55 to 60% and peak and mean transaortic gradients were 71 and 43 mmHg, respectively with a dimensionless index of 0.24.  The patient has previously been asymptomatic.  However, he recently had an episode of exertional near syncope when he went jogging with leg weights on.  Symptoms resolved slowly with rest.  He did not have any chest pain or pressure.  He did not have dyspnea with exertion.  However, he notes over the last 6 to 12 months he has developed some exercise intolerance and is not able to do his much hard work as he could in the past.  He denies heart palpitations, orthopnea, or PND.  Past Medical History:  Diagnosis Date  . Aortic stenosis   . Benign prostatic hyperplasia   . Bronchitis   . Cataract    bilateral   . Colon polyps     . History of fracture of vertebral column   . Hyperlipidemia   . Kidney stone   . Nonrheumatic aortic valve stenosis   . Osteoporosis   . Osteoporosis   . Pure hypercholesterolemia   . RBBB   . Sensorineural hearing loss of both ears   . Thrombosed hemorrhoids    hsitory of  . Tubular adenoma of colon   . Vitamin D deficiency     Past Surgical History:  Procedure Laterality Date  . COLON SURGERY  2006   1 foot ascending colon removed   . COLONOSCOPY    . HERNIA REPAIR    . POLYPECTOMY    . TESTICLE SURGERY      Current Medications: Current Meds  Medication Sig  . calcium-vitamin D 250-100 MG-UNIT per tablet Take 2 tablets by mouth 2 (two) times daily.  . Cholecalciferol (VITAMIN D3) 1000 units CAPS Take by mouth.  . Magnesium 200 MG TABS Take 1 tablet by mouth daily.  . Multiple Vitamin (MULTIVITAMIN) tablet Take 1 tablet by mouth daily.  . Omega-3 1000 MG CAPS Take 1 g by mouth daily.  . pravastatin (PRAVACHOL) 40 MG tablet Take 40 mg by mouth.   Current Facility-Administered Medications for the 06/04/19 encounter (Office Visit) with Sherren Mocha, MD  Medication  . 0.9 %  sodium chloride infusion     Allergies:   Terbinafine  hcl   Social History   Socioeconomic History  . Marital status: Single    Spouse name: Not on file  . Number of children: Not on file  . Years of education: Not on file  . Highest education level: Not on file  Occupational History  . Not on file  Tobacco Use  . Smoking status: Never Smoker  . Smokeless tobacco: Never Used  Substance and Sexual Activity  . Alcohol use: Yes    Comment: Moderately  . Drug use: No  . Sexual activity: Not on file  Other Topics Concern  . Not on file  Social History Narrative  . Not on file   Social Determinants of Health   Financial Resource Strain:   . Difficulty of Paying Living Expenses:   Food Insecurity:   . Worried About Charity fundraiser in the Last Year:   . Arboriculturist in the  Last Year:   Transportation Needs:   . Film/video editor (Medical):   [name] Lack of Transportation (Non-Medical):   Physical Activity:   . Days of Exercise per Week:   . Minutes of Exercise per Session:   Stress:   . Feeling of Stress :   Social Connections:   . Frequency of Communication with Friends and Family:   . Frequency of Social Gatherings with Friends and Family:   . Attends Religious Services:   . Active Member of Clubs or Organizations:   . Attends Archivist Meetings:   [name] Marital Status:      Family History: The patient's family history is negative for Colon cancer, Colon polyps, Esophageal cancer, Rectal cancer, and Stomach cancer.  His mother died at 3, no autopsy, chest pain prior to her death  ROS:   Please see the history of present illness.    All other systems reviewed and are negative.  EKGs/Labs/Other Studies Reviewed:    The following studies were reviewed today: 2D echocardiogram is reviewed with findings outlined in the HPI.  I personally reviewed the images which demonstrate a calcified restricted aortic valve.  I am not able to tell whether the valve is bicuspid or tricuspid.  EKG:  EKG is ordered today.  The ekg ordered today demonstrates normal sinus rhythm with right bundle branch block  Recent Labs: No results found for requested labs within last 8760 hours.  Recent Lipid Panel No results found for: CHOL, TRIG, HDL, CHOLHDL, VLDL, LDLCALC, LDLDIRECT  Physical Exam:    VS:  BP 114/80   Pulse 68   Ht 5' 9.5" (1.765 m)   Wt 156 lb 3.2 oz (70.9 kg)   SpO2 97%   BMI 22.74 kg/m     Wt Readings from Last 3 Encounters:  06/04/19 156 lb 3.2 oz (70.9 kg)  07/28/18 161 lb (73 kg)  03/31/17 162 lb (73.5 kg)     GEN:  Well nourished, well developed in no acute distress HEENT: Normal NECK: No JVD; No carotid bruits LYMPHATICS: No lymphadenopathy CARDIAC: RRR, 3/6 harsh late peaking systolic murmur with absent A2 at the right upper  sternal border, no diastolic murmur RESPIRATORY:  Clear to auscultation without rales, wheezing or rhonchi  ABDOMEN: Soft, non-tender, non-distended MUSCULOSKELETAL:  No edema; No deformity  SKIN: Warm and dry NEUROLOGIC:  Alert and oriented x 3 PSYCHIATRIC:  Normal affect   STS RIsk - Isolated AVR: Risk of Mortality: 0.790% Renal Failure: 0.884% Permanent Stroke: 1.284% Prolonged Ventilation: 2.531% DSW Infection: 0.047% Reoperation: 3.622% Morbidity  or Mortality: 5.431% Short Length of Stay: 61.839% Long Length of Stay: 1.999%  ASSESSMENT:    1. Nonrheumatic aortic valve stenosis    PLAN:    In order of problems listed above:  1. The patient has severe, stage D1 aortic stenosis with recent episode of exertional lightheadedness/near syncope. I have reviewed the natural history of aortic stenosis with the patient today. We have discussed the limitations of medical therapy and the poor prognosis associated with symptomatic aortic stenosis. We have reviewed potential treatment options, including palliative medical therapy, conventional surgical aortic valve replacement, and transcatheter aortic valve replacement. We discussed treatment options in the context of the patient's specific comorbid medical conditions.  His exam and echo are both consistent with severe aortic stenosis.  The presence of symptoms are clearly an indication to move forward with further evaluation and consideration of aortic valve replacement.  I have recommended right and left heart catheterization as well as CT angiography studies of both the heart and the chest, abdomen, and pelvis. I have reviewed the risks, indications, and alternatives to cardiac catheterization, possible angioplasty, and stenting with the patient. Risks include but are not limited to bleeding, infection, vascular injury, stroke, myocardial infection, arrhythmia, kidney injury, radiation-related injury in the case of prolonged fluoroscopy  use, emergency cardiac surgery, and death. The patient understands the risks of serious complication is 1-2 in [id number] with diagnostic cardiac cath and 1-2% or less with angioplasty/stenting.  After his studies are completed, I will refer him to cardiac surgery as part of a multidisciplinary approach to his care.  We had specific discussion about TAVR in a low risk population today.  I demonstrated the TAVR procedural animation and explained the typical benefit and expected recovery.  We contrasted this with conventional surgery.  Considering this patient's relatively young age and very good health, surgical aortic valve replacement might offer him benefit of lower pacemaker risk in the setting of his right bundle branch block and better valve longevity with lower risk of paravalvular regurgitation and valve degeneration.  The patient is specifically interested in a minimally invasive surgical approach if possible.   Medication Adjustments/Labs and Tests Ordered: Current medicines are reviewed at length with the patient today.  Concerns regarding medicines are outlined above.  Orders Placed This Encounter  Procedures  . Basic metabolic panel  . CBC with Differential/Platelet  . EKG 12-Lead   No orders of the defined types were placed in this encounter.   Patient Instructions  COVID SCREENING INFORMATION (3/15): You are scheduled for your COVID screening on: 06/07/2019 at 8:10AM. Rance Muir (old [facility]) [address] Stay in the RIGHT lane and proceed under the brick awning and tell them you are there for pre-procedure testing Do NOT bring any pets with you to the testing site  CATHETERIZATION INSTRUCTIONS (3/16): You are scheduled for a Cardiac Catheterization on: Tuesday, June 08, 2019.  1. Please arrive at the [facility] (Main Entrance A) at [facility]: [place], [place] [address] at: 9:30AM.  (This time is two hours before your  procedure to ensure your preparation). Free valet parking service is available. You are allowed ONE visitor in the waiting room during your procedure. Both you and your visitor must wear masks. Special note: Every effort is made to have your procedure done on time. Please understand that emergencies sometimes delay scheduled procedures.  2. Diet: Do not eat solid foods after midnight.  You may have clear liquids  until 5am upon the day of the procedure.  3. Labs: TODAY! CBC, BMET  4. Medication instructions in preparation for your procedure:  1) TAKE ASPIRIN 81 mg the morning of your procedure  2) Otherwise, you may take your medications as directed  5. Plan for one night stay--bring personal belongings. 6. Bring a current list of your medications and current insurance cards. 7. You MUST have a responsible person to drive you home. 8. Someone MUST be with you the first 24 hours after you arrive home or your discharge will be delayed. 9. Please wear clothes that are easy to get on and off and wear slip-on shoes.  Thank you for allowing [place] to care for you!   -- [facility] Invasive Cardiovascular services    Signed, Sherren Mocha, MD  06/04/2019 12:42 PM    [place]

## 2019-06-04 NOTE — Addendum Note (Signed)
Addended by: Harland German A on: 06/04/2019 01:27 PM   Modules accepted: Orders

## 2019-06-04 NOTE — Patient Instructions (Addendum)
COVID SCREENING INFORMATION (3/15): You are scheduled for your COVID screening on: 06/07/2019 at 8:10AM. Rance Muir (old [facility]) [address] Stay in the RIGHT lane and proceed under the brick awning and tell them you are there for pre-procedure testing Do NOT bring any pets with you to the testing site  CATHETERIZATION INSTRUCTIONS (3/16): You are scheduled for a Cardiac Catheterization on: Tuesday, June 08, 2019.  1. Please arrive at the [facility] (Main Entrance A) at [facility]: [place], [place] [address] at: 9:30AM.  (This time is two hours before your procedure to ensure your preparation). Free valet parking service is available. You are allowed ONE visitor in the waiting room during your procedure. Both you and your visitor must wear masks. Special note: Every effort is made to have your procedure done on time. Please understand that emergencies sometimes delay scheduled procedures.  2. Diet: Do not eat solid foods after midnight.  You may have clear liquids until 5am upon the day of the procedure.  3. Labs: TODAY! CBC, BMET  4. Medication instructions in preparation for your procedure:  1) TAKE ASPIRIN 81 mg the morning of your procedure  2) Otherwise, you may take your medications as directed  5. Plan for one night stay--bring personal belongings. 6. Bring a current list of your medications and current insurance cards. 7. You MUST have a responsible person to drive you home. 8. Someone MUST be with you the first 24 hours after you arrive home or your discharge will be delayed. 9. Please wear clothes that are easy to get on and off and wear slip-on shoes.  Thank you for allowing [place] to care for you!   -- [place] Invasive Cardiovascular services

## 2019-06-05 LAB — CBC WITH DIFFERENTIAL/PLATELET
Basophils Absolute: 0 10*3/uL (ref 0.0–0.2)
Basos: 1 %
EOS (ABSOLUTE): 0.1 10*3/uL (ref 0.0–0.4)
Eos: 1 %
Hematocrit: 45.7 % (ref 37.5–51.0)
Hemoglobin: 15.1 g/dL (ref 13.0–17.7)
Immature Grans (Abs): 0 10*3/uL (ref 0.0–0.1)
Immature Granulocytes: 0 %
Lymphocytes Absolute: 2.4 10*3/uL (ref 0.7–3.1)
Lymphs: 39 %
MCH: 30.5 pg (ref 26.6–33.0)
MCHC: 33 g/dL (ref 31.5–35.7)
MCV: 92 fL (ref 79–97)
Monocytes Absolute: 0.5 10*3/uL (ref 0.1–0.9)
Monocytes: 7 %
Neutrophils Absolute: 3.3 10*3/uL (ref 1.4–7.0)
Neutrophils: 52 %
Platelets: 135 10*3/uL — ABNORMAL LOW (ref 150–450)
RBC: 4.95 x10E6/uL (ref 4.14–5.80)
RDW: 13 % (ref 11.6–15.4)
WBC: 6.3 10*3/uL (ref 3.4–10.8)

## 2019-06-05 LAB — BASIC METABOLIC PANEL
BUN/Creatinine Ratio: 29 — ABNORMAL HIGH (ref 10–24)
BUN: 23 mg/dL (ref 8–27)
CO2: 23 mmol/L (ref 20–29)
Calcium: 9.4 mg/dL (ref 8.6–10.2)
Chloride: 102 mmol/L (ref 96–106)
Creatinine, Ser: 0.79 mg/dL (ref 0.76–1.27)
GFR calc Af Amer: 105 mL/min/{1.73_m2} (ref >59)
GFR calc non Af Amer: 91 mL/min/{1.73_m2} (ref >59)
Glucose: 94 mg/dL (ref 65–99)
Potassium: 4 mmol/L (ref 3.5–5.2)
Sodium: 139 mmol/L (ref 134–144)

## 2019-06-07 ENCOUNTER — Other Ambulatory Visit (HOSPITAL_COMMUNITY)
Admission: RE | Admit: 2019-06-07 | Discharge: 2019-06-07 | Disposition: A | Discharge status: Home / Self Care | Payer: Medicare Other | Source: Ambulatory Visit | Attending: Cardiovascular Disease | Admitting: Cardiovascular Disease

## 2019-06-07 ENCOUNTER — Telehealth: Payer: Self-pay | Admitting: *Deleted

## 2019-06-07 DIAGNOSIS — Z01812 Encounter for preprocedural laboratory examination: Secondary | ICD-10-CM | POA: Diagnosis present

## 2019-06-07 DIAGNOSIS — Z20822 Contact with and (suspected) exposure to covid-19: Secondary | ICD-10-CM | POA: Insufficient documentation

## 2019-06-07 LAB — SARS CORONAVIRUS 2 (TAT 6-24 HRS): SARS Coronavirus 2: NEGATIVE

## 2019-06-07 NOTE — Telephone Encounter (Addendum)
Pt contacted pre-catheterization scheduled at [facility] for: Tuesday June 08, 2019 11:30 AM Verified arrival time and place: [facility]) at: 9:30 AM   No solid food after midnight prior to cath, clear liquids until 5 AM day of procedure. Contrast allergy: no  AM meds can be  taken pre-cath with sip of water including: ASA 81 mg   Confirmed patient has responsible adult to drive home post procedure and observe 24 hours after arriving home: yes  Currently, due to Covid-19 pandemic, only one person will be allowed with patient. Must be the same person for patient's entire stay and will be required to wear a mask. They will be asked to wait in the waiting room for the duration of the patient's stay.  Patients are required to wear a mask when they enter the hospital.      COVID-19 Pre-Screening Questions:  . In the past 7 to 10 days have you had a cough,  shortness of breath, headache, congestion, fever (100 or greater) body aches, chills, sore throat, or sudden loss of taste or sense of smell? no . Have you been around anyone with known Covid 19 in the past 7-10 days? no . Have you been around anyone who is awaiting Covid 19 test results in the past 7 to 10 days? no . Have you been around anyone who has been exposed to Covid 19, or has mentioned symptoms of Covid 19 within the past 7 to 10 days? no   I reviewed procedure/mask/visitor instructions, COVID-19 screening questions, he verbalized understanding, thanked me for call.

## 2019-06-08 ENCOUNTER — Telehealth: Payer: Self-pay | Admitting: *Deleted

## 2019-06-08 ENCOUNTER — Encounter (HOSPITAL_COMMUNITY)
Admission: RE | Disposition: A | Discharge status: Home / Self Care | Payer: Self-pay | Source: Ambulatory Visit | Attending: Cardiovascular Disease

## 2019-06-08 ENCOUNTER — Ambulatory Visit (HOSPITAL_COMMUNITY)
Admission: RE | Admit: 2019-06-08 | Discharge: 2019-06-08 | Disposition: A | Discharge status: Home / Self Care | Payer: Medicare Other | Source: Ambulatory Visit | Attending: Cardiovascular Disease | Admitting: Cardiovascular Disease

## 2019-06-08 ENCOUNTER — Other Ambulatory Visit: Payer: Self-pay

## 2019-06-08 DIAGNOSIS — I2510 Atherosclerotic heart disease of native coronary artery without angina pectoris: Secondary | ICD-10-CM | POA: Diagnosis not present

## 2019-06-08 DIAGNOSIS — I350 Nonrheumatic aortic (valve) stenosis: Secondary | ICD-10-CM | POA: Diagnosis present

## 2019-06-08 DIAGNOSIS — N400 Enlarged prostate without lower urinary tract symptoms: Secondary | ICD-10-CM | POA: Insufficient documentation

## 2019-06-08 DIAGNOSIS — E785 Hyperlipidemia, unspecified: Secondary | ICD-10-CM | POA: Insufficient documentation

## 2019-06-08 DIAGNOSIS — Z79899 Other long term (current) drug therapy: Secondary | ICD-10-CM | POA: Insufficient documentation

## 2019-06-08 DIAGNOSIS — E7800 Pure hypercholesterolemia, unspecified: Secondary | ICD-10-CM | POA: Insufficient documentation

## 2019-06-08 HISTORY — PX: RIGHT HEART CATH AND CORONARY ANGIOGRAPHY: CATH118264

## 2019-06-08 SURGERY — RIGHT HEART CATH AND CORONARY ANGIOGRAPHY
Anesthesia: LOCAL

## 2019-06-08 MED ORDER — FENTANYL CITRATE (PF) 100 MCG/2ML IJ SOLN
INTRAMUSCULAR | Status: AC
  Filled 2019-06-08: qty 2

## 2019-06-08 MED ORDER — VERAPAMIL HCL 2.5 MG/ML IV SOLN
INTRAVENOUS | Status: AC
  Filled 2019-06-08: qty 2

## 2019-06-08 MED ORDER — LIDOCAINE HCL (PF) 1 % IJ SOLN
INTRAMUSCULAR | Status: AC
  Filled 2019-06-08: qty 30

## 2019-06-08 MED ORDER — HEPARIN (PORCINE) IN NACL 1000-0.9 UT/500ML-% IV SOLN
INTRAVENOUS | Status: AC
  Filled 2019-06-08: qty 1000

## 2019-06-08 MED ORDER — MIDAZOLAM HCL 2 MG/2ML IJ SOLN
INTRAMUSCULAR | Status: AC
  Filled 2019-06-08: qty 2

## 2019-06-08 MED ORDER — LIDOCAINE HCL (PF) 1 % IJ SOLN
INTRAMUSCULAR | Status: DC | PRN
  Administered 2019-06-08 (×2): 2 mL

## 2019-06-08 MED ORDER — SODIUM CHLORIDE 0.9% FLUSH: 3.0000 mL | Freq: Two times a day (BID) | INTRAVENOUS | Status: DC

## 2019-06-08 MED ORDER — SODIUM CHLORIDE 0.9 % WEIGHT BASED INFUSION
3.0000 mL/kg/h | INTRAVENOUS | Status: AC
  Administered 2019-06-08: 3 mL/kg/h via INTRAVENOUS

## 2019-06-08 MED ORDER — SODIUM CHLORIDE 0.9 % IV SOLN: 250.0000 mL | INTRAVENOUS | Status: DC | PRN

## 2019-06-08 MED ORDER — MIDAZOLAM HCL 2 MG/2ML IJ SOLN
INTRAMUSCULAR | Status: DC | PRN
  Administered 2019-06-08: 2 mg via INTRAVENOUS

## 2019-06-08 MED ORDER — HEPARIN SODIUM (PORCINE) 1000 UNIT/ML IJ SOLN
INTRAMUSCULAR | Status: AC
  Filled 2019-06-08: qty 1

## 2019-06-08 MED ORDER — ASPIRIN 81 MG PO CHEW: 81.0000 mg | CHEWABLE_TABLET | ORAL | Status: DC

## 2019-06-08 MED ORDER — FENTANYL CITRATE (PF) 100 MCG/2ML IJ SOLN
INTRAMUSCULAR | Status: DC | PRN
  Administered 2019-06-08: 25 ug via INTRAVENOUS

## 2019-06-08 MED ORDER — HEPARIN (PORCINE) IN NACL 1000-0.9 UT/500ML-% IV SOLN
INTRAVENOUS | Status: DC | PRN
  Administered 2019-06-08 (×2): 500 mL

## 2019-06-08 MED ORDER — VERAPAMIL HCL 2.5 MG/ML IV SOLN
INTRAVENOUS | Status: DC | PRN
  Administered 2019-06-08: 10 mL via INTRA_ARTERIAL

## 2019-06-08 MED ORDER — SODIUM CHLORIDE 0.9% FLUSH: 3.0000 mL | INTRAVENOUS | Status: DC | PRN

## 2019-06-08 MED ORDER — IOHEXOL 350 MG/ML SOLN
INTRAVENOUS | Status: DC | PRN
  Administered 2019-06-08: 30 mL

## 2019-06-08 MED ORDER — HEPARIN SODIUM (PORCINE) 1000 UNIT/ML IJ SOLN
INTRAMUSCULAR | Status: DC | PRN
  Administered 2019-06-08: 3500 [IU] via INTRAVENOUS

## 2019-06-08 MED ORDER — SODIUM CHLORIDE 0.9 % WEIGHT BASED INFUSION: 1.0000 mL/kg/h | INTRAVENOUS | Status: DC

## 2019-06-08 SURGICAL SUPPLY — 11 items

## 2019-06-08 NOTE — Research (Signed)
PHDE Informed Consent   Subject Name: Jerry Foster  Subject met inclusion and exclusion criteria.  The informed consent form, study requirements and expectations were reviewed with the subject and questions and concerns were addressed prior to the signing of the consent form.  The subject verbalized understanding of the trail requirements.  The subject agreed to participate in the PHDE trial and signed the informed consent.  The informed consent was obtained prior to performance of any protocol-specific procedures for the subject.  A copy of the signed informed consent was given to the subject and a copy was placed in the subject's medical record.  [name]. 06/08/2019, 10:57 AM

## 2019-06-08 NOTE — Progress Notes (Signed)
Discharge instructions given to friend Sonia Side. Sonia Side verbalized understanding of instruction.

## 2019-06-08 NOTE — Interval H&P Note (Signed)
History and Physical Interval Note:  06/08/2019 12:00 PM  Jerry Foster  has presented today for surgery, with the diagnosis of aortic stenosis.  The various methods of treatment have been discussed with the patient and family. After consideration of risks, benefits and other options for treatment, the patient has consented to  Procedure(s): RIGHT/LEFT HEART CATH AND CORONARY ANGIOGRAPHY (N/A) as a surgical intervention.  The patient's history has been reviewed, patient examined, no change in status, stable for surgery.  I have reviewed the patient's chart and labs.  Questions were answered to the patient's satisfaction.     Sherren Mocha

## 2019-06-08 NOTE — Telephone Encounter (Signed)
error 

## 2019-06-08 NOTE — Discharge Instructions (Signed)
Drink plenty of fluid for 48 hours and keep wrist elevated at heart level for 24 hours  Radial Site Care   This sheet gives you information about how to care for yourself after your procedure. Your health care provider may also give you more specific instructions. If you have problems or questions, contact your health care provider. What can I expect after the procedure? After the procedure, it is common to have:  Bruising and tenderness at the catheter insertion area. Follow these instructions at home: Medicines  Take over-the-counter and prescription medicines only as told by your health care provider. Insertion site care 1. Follow instructions from your health care provider about how to take care of your insertion site. Make sure you: ? Wash your hands with soap and water before you change your bandage (dressing). If soap and water are not available, use hand sanitizer. ? remove your dressing as told by your health care provider. In 24 hours 2. Check your insertion site every day for signs of infection. Check for: ? Redness, swelling, or pain. ? Fluid or blood. ? Pus or a bad smell. ? Warmth. 3. Do not take baths, swim, or use a hot tub until your health care provider approves. 4. You may shower 24-48 hours after the procedure, or as directed by your health care provider. ? Remove the dressing and gently wash the site with plain soap and water. ? Pat the area dry with a clean towel. ? Do not rub the site. That could cause bleeding. 5. Do not apply powder or lotion to the site. Activity   1. For 24 hours after the procedure, or as directed by your health care provider: ? Do not flex or bend the affected arm. ? Do not push or pull heavy objects with the affected arm. ? Do not drive yourself home from the hospital or clinic. You may drive 24 hours after the procedure unless your health care provider tells you not to. ? Do not operate machinery or power tools. 2. Do not lift  anything that is heavier than 10 lb (4.5 kg), or the limit that you are told, until your health care provider says that it is safe. For 4 days 3. Ask your health care provider when it is okay to: ? Return to work or school. ? Resume usual physical activities or sports. ? Resume sexual activity. General instructions  If the catheter site starts to bleed, raise your arm and put firm pressure on the site. If the bleeding does not stop, get help right away. This is a medical emergency.  If you went home on the same day as your procedure, a responsible adult should be with you for the first 24 hours after you arrive home.  Keep all follow-up visits as told by your health care provider. This is important. Contact a health care provider if:  You have a fever.  You have redness, swelling, or yellow drainage around your insertion site. Get help right away if:  You have unusual pain at the radial site.  The catheter insertion area swells very fast.  The insertion area is bleeding, and the bleeding does not stop when you hold steady pressure on the area.  Your arm or hand becomes pale, cool, tingly, or numb. These symptoms may represent a serious problem that is an emergency. Do not wait to see if the symptoms will go away. Get medical help right away. Call your local emergency services (911 in the [place] Do not   drive yourself to the hospital. Summary  After the procedure, it is common to have bruising and tenderness at the site.  Follow instructions from your health care provider about how to take care of your radial site wound. Check the wound every day for signs of infection.  Do not lift anything that is heavier than 10 lb (4.5 kg), or the limit that you are told, until your health care provider says that it is safe. This information is not intended to replace advice given to you by your health care provider. Make sure you discuss any questions you have with your health care  provider. Document Revised: 04/16/2017 Document Reviewed: 04/16/2017 Elsevier Patient Education  2020 [employer] Inc[employer]  

## 2019-06-09 LAB — POCT I-STAT 7, (LYTES, BLD GAS, ICA,H+H)
Bicarbonate: 25.8 mmol/L (ref 20.0–28.0)
Calcium, Ion: 1.28 mmol/L (ref 1.15–1.40)
HCT: 43 % (ref 39.0–52.0)
Hemoglobin: 14.6 g/dL (ref 13.0–17.0)
O2 Saturation: 99 %
Potassium: 3.6 mmol/L (ref 3.5–5.1)
Sodium: 140 mmol/L (ref 135–145)
TCO2: 27 mmol/L (ref 22–32)
pCO2 arterial: 45.2 mmHg (ref 32.0–48.0)
pH, Arterial: 7.365 (ref 7.350–7.450)
pO2, Arterial: 144 mmHg — ABNORMAL HIGH (ref 83.0–108.0)

## 2019-06-09 LAB — POCT I-STAT EG7
Bicarbonate: 26.6 mmol/L (ref 20.0–28.0)
Bicarbonate: 26.6 mmol/L (ref 20.0–28.0)
Calcium, Ion: 1.25 mmol/L (ref 1.15–1.40)
Calcium, Ion: 1.26 mmol/L (ref 1.15–1.40)
HCT: 43 % (ref 39.0–52.0)
HCT: 43 % (ref 39.0–52.0)
Hemoglobin: 14.6 g/dL (ref 13.0–17.0)
Hemoglobin: 14.6 g/dL (ref 13.0–17.0)
O2 Saturation: 82 %
O2 Saturation: 82 %
Potassium: 3.5 mmol/L (ref 3.5–5.1)
Potassium: 3.5 mmol/L (ref 3.5–5.1)
Sodium: 141 mmol/L (ref 135–145)
Sodium: 141 mmol/L (ref 135–145)
TCO2: 28 mmol/L (ref 22–32)
TCO2: 28 mmol/L (ref 22–32)
pCO2, Ven: 50.1 mmHg (ref 44.0–60.0)
pCO2, Ven: 50.6 mmHg (ref 44.0–60.0)
pH, Ven: 7.329 (ref 7.250–7.430)
pH, Ven: 7.333 (ref 7.250–7.430)
pO2, Ven: 50 mmHg — ABNORMAL HIGH (ref 32.0–45.0)
pO2, Ven: 50 mmHg — ABNORMAL HIGH (ref 32.0–45.0)

## 2019-06-11 ENCOUNTER — Ambulatory Visit (HOSPITAL_COMMUNITY)
Admission: RE | Admit: 2019-06-11 | Discharge: 2019-06-11 | Disposition: A | Discharge status: Home / Self Care | Payer: Medicare Other | Source: Ambulatory Visit | Attending: Cardiovascular Disease | Admitting: Cardiovascular Disease

## 2019-06-11 ENCOUNTER — Other Ambulatory Visit: Payer: Self-pay

## 2019-06-11 DIAGNOSIS — I350 Nonrheumatic aortic (valve) stenosis: Secondary | ICD-10-CM | POA: Insufficient documentation

## 2019-06-11 MED ORDER — IOHEXOL 350 MG/ML SOLN
100.0000 mL | Freq: Once | INTRAVENOUS | Status: AC | PRN
  Administered 2019-06-11: 100 mL via INTRAVENOUS

## 2019-06-21 ENCOUNTER — Institutional Professional Consult (permissible substitution): Payer: Medicare Other | Admitting: Thoracic Surgery (Cardiothoracic Vascular Surgery)

## 2019-06-21 ENCOUNTER — Other Ambulatory Visit: Payer: Self-pay | Admitting: *Deleted

## 2019-06-21 ENCOUNTER — Other Ambulatory Visit: Payer: Self-pay

## 2019-06-21 ENCOUNTER — Encounter: Payer: Self-pay | Admitting: Thoracic Surgery (Cardiothoracic Vascular Surgery)

## 2019-06-21 ENCOUNTER — Encounter: Payer: Self-pay | Admitting: *Deleted

## 2019-06-21 VITALS — BP 133/89 | HR 67 | Temp 97.7°F | Resp 16 | Ht 69.5 in | Wt 159.0 lb

## 2019-06-21 DIAGNOSIS — I350 Nonrheumatic aortic (valve) stenosis: Secondary | ICD-10-CM

## 2019-06-21 NOTE — Patient Instructions (Addendum)
Avoid strenuous exertion between now and the time of surgery  Continue taking all current medications without change through the day before surgery.  Make sure to bring all of your medications with you when you come for your Pre-Admission Testing appointment at [facility] Short-Stay Department.  Have nothing to eat or drink after midnight the night before surgery.  On the morning of surgery do not take any medications.  At your appointment for Pre-Admission Testing at the [facility] Short-Stay Department you will be asked to sign permission forms for your upcoming surgery.  By definition your signature on these forms implies that you and/or your designee provide full informed consent for your planned surgical procedure(s), that alternative treatment options have been discussed, that you understand and accept any and all potential risks, and that you have some understanding of what to expect for your post-operative convalescence.  For any major cardiac surgical procedure potential operative risks include but are not limited to at least some risk of death, stroke or other neurologic complication, myocardial infarction, congestive heart failure, respiratory failure, renal failure, bleeding requiring blood transfusion and/or reexploration, irregular heart rhythm, heart block or bradycardia requiring permanent pacemaker, pneumonia, pericardial effusion, pleural effusion, wound infection, pulmonary embolus or other thromboembolic complication, chronic pain, or other complications related to the specific procedure(s) performed.  Please call to schedule a follow-up appointment in our office prior to surgery if you have any unresolved questions about your planned surgical procedure, the associated risks, alternative treatment options, and/or expectations for your post-operative recovery.

## 2019-06-21 NOTE — Progress Notes (Signed)
HEART AND [place] SURGERY CONSULTATION REPORT  Referring Provider is Sherren Mocha, MD PCP is Jefm Petty, MD  Chief Complaint  Patient presents with  . Aortic Stenosis    TAVR EVAL and review all required studies and procedures     HPI:  Patient is a 69 year old previously healthy male who has been referred for surgical consultation to discuss treatment options for management of severe symptomatic aortic stenosis.  Patient states that he was first noted to have a murmur on physical exam more than 10 years ago.  He denies any known history of rheumatic fever during childhood.  He was diagnosed with aortic stenosis and has been followed by his primary care physician with intermittent echocardiograms.  Transthoracic echocardiogram performed in 2018 reportedly revealed normal left ventricular systolic function with ejection fraction estimated 65 to 70% and moderate aortic stenosis with peak and mean transvalvular gradient estimated 41 and 23 mmHg respectively, aortic valve area calculated 1.0 cm, and DVI reported 0.37.  Follow-up echocardiogram performed in 2020 revealed significant progression of disease with peak and mean transvalvular gradients estimated 71 and 43 mmHg respectively and DVI reported only 0.24.  Up until recently the patient has remained asymptomatic.  However, the patient recently developed a near syncopal episode while exercising.  He was initially evaluated at [facility] where he was told he would likely need surgery within the next 6 months to 2 years.  He was referred to Dr. Burt Knack for a second opinion and underwent diagnostic cardiac catheterization on June 08, 2019 which revealed mild nonobstructive coronary artery disease.  Transvalvular gradient across aortic valve not assessed.  Right heart pressures were normal.  Treatment options were discussed and the patient underwent CT angiography to  evaluate the feasibility of transcatheter aortic valve replacement.  The patient was referred for surgical consultation.  Patient is single and lives locally in [place].  He does not have children.  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  He exercises on a daily basis.  He has a brother who lives in [place] but no other family close by.  He is accompanied by a close friend for his office consultation visit today.  He states that up until recently he has remained very active physically and not experienced any sort of symptoms.  However, he now states that he has noted a tendency to fatigue more easily.  He suffered a near syncopal episode while exercising recently while jogging with light weights around his ankles.  He states that it took him quite a while to recover.  Since then he has had a couple other mild dizzy spells and atypical sharp pains across his chest that seem to come and go sporadically.  He denies any significant shortness of breath either with activity or rest.  He has not had PND, orthopnea, or lower extremity edema.  Past Medical History:  Diagnosis Date  . Aortic stenosis   . Benign prostatic hyperplasia   . Bronchitis   . Cataract    bilateral   . Colon polyps   . History of fracture of vertebral column   . Hyperlipidemia   . Kidney stone   . Nonrheumatic aortic valve stenosis   . Osteoporosis   . Osteoporosis   . Pure hypercholesterolemia   . RBBB   . Sensorineural hearing loss of both ears   . Thrombosed hemorrhoids    hsitory of  . Tubular adenoma of colon   .  Vitamin D deficiency     Past Surgical History:  Procedure Laterality Date  . COLON SURGERY  2006   1 foot ascending colon removed   . COLONOSCOPY    . HERNIA REPAIR    . POLYPECTOMY    . RIGHT HEART CATH AND CORONARY ANGIOGRAPHY N/A 06/08/2019   Procedure: RIGHT HEART CATH AND CORONARY ANGIOGRAPHY;  Surgeon: Sherren Mocha, MD;  Location: [facility];  Service:  Cardiovascular;  Laterality: N/A;  . TESTICLE SURGERY      Family History  Problem Relation Age of Onset  . Colon cancer Neg Hx   . Colon polyps Neg Hx   . Esophageal cancer Neg Hx   . Rectal cancer Neg Hx   . Stomach cancer Neg Hx     Social History   Socioeconomic History  . Marital status: Single    Spouse name: Not on file  . Number of children: Not on file  . Years of education: Not on file  . Highest education level: Not on file  Occupational History  . Not on file  Tobacco Use  . Smoking status: Never Smoker  . Smokeless tobacco: Never Used  Substance and Sexual Activity  . Alcohol use: Yes    Comment: Moderately  . Drug use: No  . Sexual activity: Not on file  Other Topics Concern  . Not on file  Social History Narrative  . Not on file   Social Determinants of Health   Financial Resource Strain:   . Difficulty of Paying Living Expenses:   Food Insecurity:   . Worried About Charity fundraiser in the Last Year:   . Arboriculturist in the Last Year:   Transportation Needs:   . Film/video editor (Medical):   [name] Lack of Transportation (Non-Medical):   Physical Activity:   . Days of Exercise per Week:   . Minutes of Exercise per Session:   Stress:   . Feeling of Stress :   Social Connections:   . Frequency of Communication with Friends and Family:   . Frequency of Social Gatherings with Friends and Family:   . Attends Religious Services:   . Active Member of Clubs or Organizations:   . Attends Archivist Meetings:   [name] Marital Status:   Intimate Partner Violence:   . Fear of Current or Ex-Partner:   . Emotionally Abused:   [name] Physically Abused:   . Sexually Abused:     Current Outpatient Medications  Medication Sig Dispense Refill  . acetaminophen (TYLENOL) 500 MG tablet Take 500 mg by mouth every 8 (eight) hours as needed for moderate pain or headache.    . Calcium Citrate-Vitamin D (CITRACAL + D PO) Take 2 tablets by mouth in the  morning and at bedtime.    . Cholecalciferol (DIALYVITE VITAMIN D 5000) 125 MCG (5000 UT) capsule Take 5,000 Units by mouth daily.    [name] ibuprofen (ADVIL) 200 MG tablet Take 400 mg by mouth every 6 (six) hours as needed for moderate pain.    . Magnesium 400 MG CAPS Take 400 mg by mouth daily.    . Omega-3 1400 MG CAPS Take 1,400 mg by mouth daily.    . pravastatin (PRAVACHOL) 40 MG tablet Take 40 mg by mouth.    . fluorouracil (EFUDEX) 5 % cream Apply 1 application topically 2 (two) times daily.     Current Facility-Administered Medications  Medication Dose Route Frequency Provider Last Rate Last Admin  .  0.9 %  sodium chloride infusion  500 mL Intravenous Once Armbruster, Carlota Raspberry, MD        Allergies  Allergen Reactions  . Terbinafine Hcl Other (See Comments)    Elevated LFT      Review of Systems:   General:  normal appetite, decreased energy, no weight gain, no weight loss, no fever  Cardiac:  no chest pain with exertion, no chest pain at rest, no SOB with exertion, no resting SOB, no PND, no orthopnea, + palpitations, no arrhythmia, no atrial fibrillation, no LE edema, + dizzy spells, near syncope  Respiratory:  no shortness of breath, no home oxygen, no productive cough, no dry cough, no bronchitis, no wheezing, no hemoptysis, no asthma, no pain with inspiration or cough, no sleep apnea, no CPAP at night  GI:   no difficulty swallowing, no reflux, no frequent heartburn, no hiatal hernia, no abdominal pain, no constipation, no diarrhea, no hematochezia, no hematemesis, no melena  GU:   no dysuria,  no frequency, no urinary tract infection, no hematuria, no enlarged prostate, no kidney stones, no kidney disease  Vascular:  no pain suggestive of claudication, no pain in feet, no leg cramps, no varicose veins, no DVT, no non-healing foot ulcer  Neuro:   no stroke, no TIA's, no seizures, no headaches, no temporary blindness one eye,  no slurred speech, no peripheral neuropathy, no  chronic pain, no instability of gait, no memory/cognitive dysfunction  Musculoskeletal: no arthritis, no joint swelling, no myalgias, no difficulty walking, normal mobility   Skin:   no rash, no itching, no skin infections, no pressure sores or ulcerations  Psych:   no anxiety, no depression, no nervousness, no unusual recent stress  Eyes:   no blurry vision, no floaters, no recent vision changes, does not wear glasses or contacts  ENT:   no hearing loss, no loose or painful teeth, no dentures, last saw dentist within the past year  Hematologic:  no easy bruising, no abnormal bleeding, no clotting disorder, no frequent epistaxis  Endocrine:  no diabetes, does not check CBG's at home           Physical Exam:   BP 133/89 (BP Location: Right Arm, Patient Position: Sitting, Cuff Size: Normal)   Pulse 67   Temp 97.7 F (36.5 C)   Resp 16   Ht 5' 9.5" (1.765 m)   Wt 159 lb (72.1 kg)   SpO2 98% Comment: REA  BMI 23.14 kg/m   General:  Thin,  well-appearing  HEENT:  Unremarkable   Neck:   no JVD, no bruits, no adenopathy   Chest:   clear to auscultation, symmetrical breath sounds, no wheezes, no rhonchi   CV:   RRR, grade III/VI crescendo/decrescendo murmur heard best at RSB,  no diastolic murmur  Abdomen:  soft, non-tender, no masses   Extremities:  warm, well-perfused, pulses palpabld, no LE edema  Rectal/GU  Deferred  Neuro:   Grossly non-focal and symmetrical throughout  Skin:   Clean and dry, no rashes, no breakdown   Diagnostic Tests:  TRANSTHORACIC ECHOCARDIOGRAM  Report from transthoracic echocardiogram performed at [facility] on January 28, 2019 is reviewed.  By report there was normal left ventricular systolic function with ejection fraction estimated 55 to 60%.  There was severe aortic stenosis with peak velocity across aortic valve measured 4.2 m/s corresponding to mean transvalvular gradient estimated 43 mmHg and aortic valve area calculated only  0.68 cm.  DVI was reported  0.24.     RIGHT HEART CATH AND CORONARY ANGIOGRAPHY  Conclusion  1. Mild nonobstructive CAD without any high-grade or flow-limiting stenoses 2. Bulky aortic valve calcification with restricted leaflet mobility 3. Normal right heart pressures/preserved cardiac output  Plan: CTA studies, cardiac surgical evaluation for consideration of aortic valve replacement  Indications  Severe aortic stenosis [I35.0 (ICD-10-CM)]  Procedural Details  Technical Details INDICATION: Severe symptomatic aortic stenosis (preoperative assessment)  PROCEDURAL DETAILS: There was an indwelling IV in a right antecubital vein. Using normal sterile technique, the IV was changed out for a 5 Fr brachial sheath over a 0.018 inch wire. The right wrist was then prepped, draped, and anesthetized with 1% lidocaine. Using the modified Seldinger technique a 5/6 French Slender sheath was placed in the right radial artery. Intra-arterial verapamil was administered through the radial artery sheath. IV heparin was administered after a JR4 catheter was advanced into the central aorta. A Swan-Ganz catheter was used for the right heart catheterization. Standard protocol was followed for recording of right heart pressures and sampling of oxygen saturations. Fick cardiac output was calculated. Standard Judkins catheters were used for selective coronary angiography. There were no immediate procedural complications. The patient was transferred to the post catheterization recovery area for further monitoring.    Estimated blood loss <50 mL.   During this procedure medications were administered to achieve and maintain moderate conscious sedation while the patient's heart rate, blood pressure, and oxygen saturation were continuously monitored and I was present face-to-face 100% of this time.  Medications (Filter: Administrations occurring from 06/08/19 1242 to 06/08/19 1331) Heparin (Porcine) in NaCl 1000-0.9  UT/500ML-% SOLN (mL) Total volume:  1,000 mL Date/Time  Rate/Dose/Volume Action  06/08/19 1248  500 mL Given  1248  500 mL Given    midazolam (VERSED) injection (mg) Total dose:  2 mg Date/Time  Rate/Dose/Volume Action  06/08/19 1305  2 mg Given    fentaNYL (SUBLIMAZE) injection (mcg) Total dose:  25 mcg Date/Time  Rate/Dose/Volume Action  06/08/19 1305  25 mcg Given    lidocaine (PF) (XYLOCAINE) 1 % injection (mL) Total volume:  4 mL Date/Time  Rate/Dose/Volume Action  06/08/19 1306  2 mL Given  1307  2 mL Given    Radial Cocktail/Verapamil only (mL) Total volume:  10 mL Date/Time  Rate/Dose/Volume Action  06/08/19 1307  10 mL Given    heparin injection (Units) Total dose:  3,500 Units Date/Time  Rate/Dose/Volume Action  06/08/19 1313  3,500 Units Given    iohexol (OMNIPAQUE) 350 MG/ML injection (mL) Total volume:  30 mL Date/Time  Rate/Dose/Volume Action  06/08/19 1323  30 mL Given    Sedation Time  Sedation Time Physician-1: 14 minutes 50 seconds  Contrast  Medication Name Total Dose  iohexol (OMNIPAQUE) 350 MG/ML injection 30 mL    Radiation/Fluoro  Fluoro time: 3 (min) DAP: 8114 (mGycm2) Cumulative Air Kerma: 148 (mGy)  Coronary Findings  Diagnostic Dominance: Right Left Anterior Descending  Mid LAD lesion 25% stenosed  Mid LAD lesion is 25% stenosed.  First Diagonal Branch  Large first diagonal without significant stenosis  Left Circumflex  The vessel exhibits minimal luminal irregularities. The circumflex supplies a large area of myocardium. The OM branches are all patent without significant stenoses.  Right Coronary Artery  Ost RCA lesion 30% stenosed  Ost RCA lesion is 30% stenosed. mild nonobstructive ostial RCA stenosis  Intervention  No interventions have been documented. Left Heart  Aortic Valve There is severe aortic valve stenosis. The  aortic valve is calcified. There is restricted aortic valve motion.  Coronary  Diagrams  Diagnostic Dominance: Right  Intervention  Implants   No implant documentation for this case.  Syngo Images  Show images for CARDIAC CATHETERIZATION  Images on Long Term Storage  Show images for Gatling, Jaamal "[name]"   Link to Procedure Log  Procedure Log    Hemo Data   Most Recent Value  Fick Cardiac Output 7.21 L/min  Fick Cardiac Output Index 3.84 (L/min)/BSA  RA A Wave 10 mmHg  RA V Wave 4 mmHg  RA Mean 2 mmHg  RV Systolic Pressure 32 mmHg  RV Diastolic Pressure 0 mmHg  RV EDP 6 mmHg  PA Systolic Pressure 23 mmHg  PA Diastolic Pressure 7 mmHg  PA Mean 14 mmHg  PW A Wave 22 mmHg  PW V Wave 18 mmHg  PW Mean 13 mmHg  AO Systolic Pressure 123456 mmHg  AO Diastolic Pressure 70 mmHg  AO Mean 90 mmHg  QP/QS 1  TPVR Index 3.65 HRUI  TSVR Index 23.48 HRUI  PVR SVR Ratio 0.01  TPVR/TSVR Ratio 0.16    Cardiac TAVR CT  TECHNIQUE: The patient was scanned on a [employer]. A 120 kV retrospective scan was triggered in the descending thoracic aorta at 111 HU's. Gantry rotation speed was 250 msecs and collimation was .6 mm. No beta blockade or nitro were given. The 3D data set was reconstructed in 5% intervals of the R-R cycle. Systolic and diastolic phases were analyzed on a dedicated work station using MPR, MIP and VRT modes. The patient received 80 cc of contrast.  FINDINGS: Aortic Valve: Bicuspid aortic valve with severely calcified leaflets and severely restricted leaflet opening, significant calcifications are extending into the LVOT. Aortic valve calcium score 3747.  Aorta: Normal size, minimal plaque, no calcifications and no dissection.  Sinotubular Junction: 25.3 x 24.4 mm  Ascending Thoracic Aorta: 33.2 x 32.4 mm  Aortic Arch: 25.2 x 24.5 mm  Descending Thoracic Aorta: 25.9 x 23.2 mm  Sinus of Valsalva Measurements: 33.7 x 28.6 mm  Coronary Artery Height above Annulus:  Left Main: 13.4 mm  Right Coronary: 19.3  mm  Virtual Basal Annulus Measurements:  Maximum/Minimum Diameter: 27.2 x 22.7 mm  Mean Diameter: 25.0 mm  Perimeter: 80.2 mm  Area: 491 mm2  IMPRESSION: 1. Bicuspid aortic valve with severely calcified leaflets and severely restricted leaflet opening, significant calcifications are extending into the LVOT. Aortic valve calcium score 3747.  2. Sufficient coronary to annulus distance.  3. No thrombus in the left atrial appendage.   Electronically Signed   By: Ena Dawley   On: 06/19/2019 01:10    CT ANGIOGRAPHY CHEST, ABDOMEN AND PELVIS  TECHNIQUE: Multidetector CT imaging through the chest, abdomen and pelvis was performed using the standard protocol during bolus administration of intravenous contrast. Multiplanar reconstructed images and MIPs were obtained and reviewed to evaluate the vascular anatomy.  CONTRAST:  184mL OMNIPAQUE IOHEXOL 350 MG/ML SOLN  COMPARISON:  01/28/2012 CT abdomen/pelvis.  FINDINGS: CTA CHEST FINDINGS  Cardiovascular: Top-normal heart size. No significant pericardial effusion/thickening. Coarse calcification and diffuse thickening of the aortic valve. Minimally atherosclerotic nonaneurysmal thoracic aorta. Aortic arch branch vessels are patent. Normal caliber pulmonary arteries. No central pulmonary emboli.  Mediastinum/Nodes: No discrete thyroid nodules. Mildly patulous thoracic esophagus with small esophageal fluid level. No pathologically enlarged axillary, mediastinal or hilar lymph nodes.  Lungs/Pleura: No pneumothorax. No pleural effusion. Small curvilinear parenchymal band at the peripheral right lung base  compatible with mild scarring. Apical left upper lobe 4 mm solid pulmonary nodule (series 16/image 19). No acute consolidative airspace disease, lung masses or additional significant pulmonary nodules.  Musculoskeletal: No aggressive appearing focal osseous lesions. Mild thoracic spondylosis.  CTA  ABDOMEN AND PELVIS FINDINGS  Hepatobiliary: Normal liver with no liver mass. Normal gallbladder with no radiopaque cholelithiasis. No biliary ductal dilatation.  Pancreas: Normal, with no mass or duct dilation.  Spleen: Normal size. No mass.  Adrenals/Urinary Tract: Normal adrenals. No hydronephrosis. Nonobstructing 4 mm lower left renal stone. Simple left renal cysts, largest 1.9 cm in the upper left kidney. Cyst no additional contour deforming renal lesions. Normal bladder.  Stomach/Bowel: Normal non-distended stomach. Stable postsurgical changes in the ileocecal region. No dilated or thick-walled small bowel loops. Appendix not discretely visualized. No large bowel wall thickening, diverticulosis or acute pericolonic fat stranding.  Vascular/Lymphatic: Minimally atherosclerotic nonaneurysmal abdominal aorta. Patent renal and splenic veins. No pathologically enlarged lymph nodes in the abdomen or pelvis.  Reproductive: Normal size prostate with nonspecific internal prostatic calcifications.  Other: No pneumoperitoneum, ascites or focal fluid collection.  Musculoskeletal: No aggressive appearing focal osseous lesions. Mild lumbar spondylosis.  VASCULAR MEASUREMENTS PERTINENT TO TAVR:  AORTA:  Minimal Aortic Diameter-14.1 x 13.5 mm  Severity of Aortic Calcification-mild  RIGHT PELVIS:  Right Common Iliac Artery -  Minimal Diameter-8.7 x 8.7 mm  Tortuosity-mild  Calcification-mild  Right External Iliac Artery -  Minimal Diameter-8.6 x 8.4 mm  Tortuosity-moderate  Calcification-none  Right Common Femoral Artery -  Minimal Diameter-8.1 x 8.0 mm  Tortuosity-mild  Calcification-mild  LEFT PELVIS:  Left Common Iliac Artery -  Minimal Diameter-9.7 x 9.6 mm  Tortuosity-mild  Calcification-mild  Left External Iliac Artery -  Minimal Diameter-8.7 x 8.6 mm  Tortuosity-moderate  Calcification-none  Left Common  Femoral Artery -  Minimal Diameter-9.0 x 7.5 mm  Tortuosity-mild  Calcification-mild  Review of the MIP images confirms the above findings.  IMPRESSION: 1. Vascular findings and measurements pertinent to potential TAVR procedure, as detailed. 2. Coarse calcification and diffuse thickening of the aortic valve, compatible with the reported clinical history of severe aortic stenosis. 3. Apical left upper lobe 4 mm solid pulmonary nodule. No follow-up needed if patient is low-risk. Non-contrast chest CT can be considered in 12 months if patient is high-risk. This recommendation follows the consensus statement: Guidelines for Management of Incidental Pulmonary Nodules Detected on CT Images: From the Fleischner Society 2017; Radiology 2017; 284:228-243. 4. Mildly patulous thoracic esophagus with small esophageal fluid level, suggesting esophageal dysmotility and/or gastroesophageal reflux. 5. Nonobstructing left nephrolithiasis. 6.  Aortic Atherosclerosis (ICD10-I70.0).   Electronically Signed   By: Ilona Sorrel M.D.   On: 06/11/2019 12:02    Impression:  Patient has bicuspid aortic valve with stage D severe symptomatic aortic stenosis.  Patient describes recent onset of symptoms of increased fatigue as well as exercise-induced dizzy spells and a single near syncopal event.  He is otherwise remarkably healthy and quite active physically.  I have personally reviewed the patient's recent transthoracic echocardiogram, diagnostic cardiac catheterization, and CT angiograms.  Patient has what appears to be a bicuspid aortic valve which is heavily calcified including asymmetric calcification of the leaflets.  Peak velocity across the aortic valve was measured 4.2 m/s corresponding to mean transvalvular gradient estimated 42 mmHg.  Left ventricular systolic function remains normal.  Diagnostic cardiac catheterization is notable for the absence of significant coronary artery disease  and reveals normal right heart pressures.  I agree the  patient needs aortic valve replacement.  Cardiac gated CT angiogram of the heart reveals findings consistent with bicuspid aortic valve with heavy and asymmetric leaflet calcification which would likely significantly increase risk of paravalvular leak.  The patient also has baseline right bundle branch block on EKG.  Risks associated with conventional surgery would be expected to be very low and the patient appears to be a good candidate for minimally invasive approach for surgery.  Under the circumstances I agree conventional surgery would likely be the best approach.   Plan:  The patient and his friend were counseled at length regarding treatment alternatives for the management of severe symptomatic aortic stenosis. Alternative approaches such as conventional aortic valve replacement, transcatheter aortic valve replacement, and continued medical therapy without intervention were compared and contrasted at length.  The risks associated with conventional surgical aortic valve replacement were discussed in detail, as were alternative surgical approaches, prosthetic valve choices, and expectations for the patient's post-operative convalescence.  Issues specific to transcatheter aortic valve replacement were discussed including the typical convalescence following TAVR and how it differs from conventional surgery, questions about long term valve durability, the potential for paravalvular leak, possible increased risk of need for permanent pacemaker placement, the suitability of the patient's surgical anatomy, and other potential technical complications related to the procedure itself.  The natural history of aortic stenosis and long-term prognosis without surgical intervention was discussed.  All treatment options were discussed in the context of the patient's own specific clinical presentation, past medical history, long-term prognosis and life expectancy.   All questions have been addressed.  The patient requests to proceed with conventional surgical aortic valve replacement via right minithoracotomy approach.  We plan to proceed with surgery on July 22, 2019 patient will return to our office for follow-up prior to surgery on July 19, 2019.    I spent in excess of 90 minutes during the conduct of this office consultation and >50% of this time involved direct face-to-face encounter with the patient for counseling and/or coordination of their care.     Valentina Gu. Roxy Manns, MD 06/21/2019 1:05 PM

## 2019-07-18 NOTE — Pre-Procedure Instructions (Signed)
J[place]  07/18/2019      [facility] ([place]), [place] - [address] [place] ([place]) [place] [address] Phone: [phone] Fax: [place] [phone] [place], [place] - [place] [facility] [place] [address] [place] [address] Phone: [phone] Fax: [phone]    Your procedure is scheduled on April 29  Report to [name] Entrance A at 5:30 A.M.  Call this number if you have problems the morning of surgery:  [phone]   Remember:  Do not eat or drink after midnight.      Take these medicines the morning of surgery with A SIP OF WATER :              Tylenol if needed              Pravastatin (pravachol)       7 days prior to surgery STOP taking any Aspirin (unless otherwise instructed by your surgeon), Aleve, Naproxen, Ibuprofen, Motrin, Advil, Goody's, BC's, all herbal medications, fish oil, and all vitamins.    Do not wear jewelry.  Do not wear lotions, powders, or cologne, or deodorant.  Do not shave 48 hours prior to surgery.  Men may shave face and neck.  Do not bring valuables to the hospital.  [name] is not responsible for any belongings or valuables.  Contacts, dentures or bridgework may not be worn into surgery.  Leave your suitcase in the car.  After surgery it may be brought to your room.  For patients admitted to the hospital, discharge time will be determined by your treatment team.  Patients discharged the day of surgery will not be allowed to drive home.    Special instructions:   [name]  Before surgery, you can play an important role. Because skin is not sterile, your skin needs to be as free of germs as possible. You can reduce the number of germs on your skin by washing with CHG (chlorahexidine gluconate) Soap before surgery.  CHG is an antiseptic cleaner which kills germs and bonds with the skin  to continue killing germs even after washing.    Oral Hygiene is also important to reduce your risk of infection.  Remember - BRUSH YOUR TEETH THE MORNING OF SURGERY WITH YOUR REGULAR TOOTHPASTE  Please do not use if you have an allergy to CHG or antibacterial soaps. If your skin becomes reddened/irritated stop using the CHG.  Do not shave (including legs and underarms) for at least 48 hours prior to first CHG shower. It is OK to shave your face.  Please follow these instructions carefully.   1. Shower the NIGHT BEFORE SURGERY and the MORNING OF SURGERY with CHG.   2. If you chose to wash your hair, wash your hair first as usual with your normal shampoo.  3. After you shampoo, rinse your hair and body thoroughly to remove the shampoo.  4. Use CHG as you would any other liquid soap. You can apply CHG directly to the skin and wash gently with a scrungie or a clean washcloth.   5. Apply the CHG Soap to your body ONLY FROM THE NECK DOWN.  Do not use on open wounds or open sores. Avoid contact with your eyes, ears, mouth and genitals (private parts). Wash Face and genitals (private parts)  with your normal soap.  6. Wash thoroughly, paying special attention  to the area where your surgery will be performed.  7. Thoroughly rinse your body with warm water from the neck down.  8. DO NOT shower/wash with your normal soap after using and rinsing off the CHG Soap.  9. Pat yourself dry with a CLEAN TOWEL.  10. Wear CLEAN PAJAMAS to bed the night before surgery, wear comfortable clothes the morning of surgery  11. Place CLEAN SHEETS on your bed the night of your first shower and DO NOT SLEEP WITH PETS.    Day of Surgery:  Do not apply any deodorants/lotions.  Please wear clean clothes to the hospital/surgery center.   Remember to brush your teeth WITH YOUR REGULAR TOOTHPASTE.    Please read over the following fact sheets that you were given. Coughing and Deep Breathing, MRSA Information  and Surgical Site Infection Prevention

## 2019-07-19 ENCOUNTER — Ambulatory Visit: Payer: Medicare Other | Admitting: Thoracic Surgery (Cardiothoracic Vascular Surgery)

## 2019-07-19 ENCOUNTER — Ambulatory Visit (HOSPITAL_COMMUNITY)
Admission: RE | Admit: 2019-07-19 | Discharge: 2019-07-19 | Disposition: A | Discharge status: Home / Self Care | Payer: Medicare Other | Source: Ambulatory Visit | Attending: Thoracic Surgery (Cardiothoracic Vascular Surgery) | Admitting: Thoracic Surgery (Cardiothoracic Vascular Surgery)

## 2019-07-19 ENCOUNTER — Other Ambulatory Visit (HOSPITAL_COMMUNITY)
Admission: RE | Admit: 2019-07-19 | Discharge: 2019-07-19 | Disposition: A | Discharge status: Home / Self Care | Payer: Medicare Other | Source: Ambulatory Visit

## 2019-07-19 ENCOUNTER — Encounter (HOSPITAL_COMMUNITY): Payer: Self-pay

## 2019-07-19 ENCOUNTER — Other Ambulatory Visit: Payer: Self-pay

## 2019-07-19 ENCOUNTER — Encounter (HOSPITAL_COMMUNITY)
Admission: RE | Admit: 2019-07-19 | Discharge: 2019-07-19 | Disposition: A | Discharge status: Home / Self Care | Payer: Medicare Other | Source: Ambulatory Visit | Attending: Thoracic Surgery (Cardiothoracic Vascular Surgery) | Admitting: Thoracic Surgery (Cardiothoracic Vascular Surgery)

## 2019-07-19 ENCOUNTER — Encounter: Payer: Self-pay | Admitting: Thoracic Surgery (Cardiothoracic Vascular Surgery)

## 2019-07-19 VITALS — BP 119/65 | HR 69 | Temp 97.3°F | Resp 16 | Ht 69.0 in | Wt 158.0 lb

## 2019-07-19 DIAGNOSIS — E785 Hyperlipidemia, unspecified: Secondary | ICD-10-CM | POA: Diagnosis not present

## 2019-07-19 DIAGNOSIS — Z20822 Contact with and (suspected) exposure to covid-19: Secondary | ICD-10-CM | POA: Insufficient documentation

## 2019-07-19 DIAGNOSIS — Z01818 Encounter for other preprocedural examination: Secondary | ICD-10-CM | POA: Diagnosis present

## 2019-07-19 DIAGNOSIS — I350 Nonrheumatic aortic (valve) stenosis: Secondary | ICD-10-CM

## 2019-07-19 DIAGNOSIS — I4510 Unspecified right bundle-branch block: Secondary | ICD-10-CM | POA: Diagnosis not present

## 2019-07-19 DIAGNOSIS — I6523 Occlusion and stenosis of bilateral carotid arteries: Secondary | ICD-10-CM | POA: Diagnosis not present

## 2019-07-19 DIAGNOSIS — R9431 Abnormal electrocardiogram [ECG] [EKG]: Secondary | ICD-10-CM | POA: Diagnosis not present

## 2019-07-19 HISTORY — DX: Heart failure, unspecified: I50.9

## 2019-07-19 HISTORY — DX: Cardiac murmur, unspecified: R01.1

## 2019-07-19 LAB — URINALYSIS, ROUTINE W REFLEX MICROSCOPIC
Bilirubin Urine: NEGATIVE
Glucose, UA: NEGATIVE mg/dL
Hgb urine dipstick: NEGATIVE
Ketones, ur: NEGATIVE mg/dL
Leukocytes,Ua: NEGATIVE
Nitrite: NEGATIVE
Protein, ur: NEGATIVE mg/dL
Specific Gravity, Urine: 1.006 (ref 1.005–1.030)
pH: 8 (ref 5.0–8.0)

## 2019-07-19 LAB — PROTIME-INR
INR: 1 (ref 0.8–1.2)
Prothrombin Time: 13.1 seconds (ref 11.4–15.2)

## 2019-07-19 LAB — COMPREHENSIVE METABOLIC PANEL
ALT: 26 U/L (ref 0–44)
AST: 29 U/L (ref 15–41)
Albumin: 4 g/dL (ref 3.5–5.0)
Alkaline Phosphatase: 54 U/L (ref 38–126)
Anion gap: 11 (ref 5–15)
BUN: 17 mg/dL (ref 8–23)
CO2: 22 mmol/L (ref 22–32)
Calcium: 9.7 mg/dL (ref 8.9–10.3)
Chloride: 105 mmol/L (ref 98–111)
Creatinine, Ser: 0.75 mg/dL (ref 0.61–1.24)
GFR calc Af Amer: >60 mL/min (ref >60)
GFR calc non Af Amer: >60 mL/min (ref >60)
Glucose, Bld: 111 mg/dL — ABNORMAL HIGH (ref 70–99)
Potassium: 3.8 mmol/L (ref 3.5–5.1)
Sodium: 138 mmol/L (ref 135–145)
Total Bilirubin: 0.7 mg/dL (ref 0.3–1.2)
Total Protein: 7.1 g/dL (ref 6.5–8.1)

## 2019-07-19 LAB — CBC
HCT: 47.1 % (ref 39.0–52.0)
Hemoglobin: 16.1 g/dL (ref 13.0–17.0)
MCH: 30.7 pg (ref 26.0–34.0)
MCHC: 34.2 g/dL (ref 30.0–36.0)
MCV: 89.7 fL (ref 80.0–100.0)
Platelets: 134 10*3/uL — ABNORMAL LOW (ref 150–400)
RBC: 5.25 MIL/uL (ref 4.22–5.81)
RDW: 12.4 % (ref 11.5–15.5)
WBC: 5.2 10*3/uL (ref 4.0–10.5)
nRBC: 0 % (ref 0.0–0.2)

## 2019-07-19 LAB — TYPE AND SCREEN
ABO/RH(D): O POS
Antibody Screen: NEGATIVE

## 2019-07-19 LAB — BLOOD GAS, ARTERIAL
Acid-Base Excess: 1.1 mmol/L (ref 0.0–2.0)
Bicarbonate: 25.1 mmol/L (ref 20.0–28.0)
Drawn by: 421801
FIO2: 21
O2 Saturation: 98.5 %
Patient temperature: 37
pCO2 arterial: 39.4 mmHg (ref 32.0–48.0)
pH, Arterial: 7.42 (ref 7.350–7.450)
pO2, Arterial: 126 mmHg — ABNORMAL HIGH (ref 83.0–108.0)

## 2019-07-19 LAB — HEMOGLOBIN A1C
Hgb A1c MFr Bld: 5.7 % — ABNORMAL HIGH (ref 4.8–5.6)
Mean Plasma Glucose: 116.89 mg/dL

## 2019-07-19 LAB — ABO/RH: ABO/RH(D): O POS

## 2019-07-19 LAB — SARS CORONAVIRUS 2 (TAT 6-24 HRS): SARS Coronavirus 2: NEGATIVE

## 2019-07-19 LAB — SURGICAL PCR SCREEN
MRSA, PCR: NEGATIVE
Staphylococcus aureus: NEGATIVE

## 2019-07-19 LAB — APTT: aPTT: 28 seconds (ref 24–36)

## 2019-07-19 NOTE — Progress Notes (Signed)
Pre avr has been completed.   Preliminary results in CV Proc.   Jerry Foster 07/19/2019 10:31 AM

## 2019-07-19 NOTE — Progress Notes (Signed)
PCP - Jefm Petty @ [place] - Dr. Burt Knack    Chest x-ray - 07/19/19 EKG - 07/19/19 Stress Test -  ECHO - 3/21 Cardiac Cath - 3/21  Sleep Study - na   Blood Thinner Instructions:  Aspirin Instructions ; na   COVID TEST- 07/19/19   Anesthesia review:   Patient denies shortness of breath, fever, cough and chest pain at PAT appointment   All instructions explained to the patient, with a verbal understanding of the material. Patient agrees to go over the instructions while at home for a better understanding. Patient also instructed to self quarantine after being tested for COVID-19. The opportunity to ask questions was provided.

## 2019-07-19 NOTE — Progress Notes (Signed)
[address]       [place], [address]             [place] OFFICE NOTE  Referring Provider is Sherren Mocha, MD PCP is Jefm Petty, MD   HPI:  Patient is a 69 year old male with bicuspid aortic valve who returns to the office for follow-up of severe symptomatic aortic stenosis.  He was initially seen in consultation on June 21, 2019 at which time we made tentative plans to proceed with surgery on July 22, 2019.  He reports no new problems or complaints over the last few weeks and is eager to proceed with surgery as previously planned.   Current Outpatient Medications  Medication Sig Dispense Refill  . acetaminophen (TYLENOL) 500 MG tablet Take 500 mg by mouth every 8 (eight) hours as needed for moderate pain or headache.    . Calcium Citrate-Vitamin D (CITRACAL + D PO) Take 2 tablets by mouth in the morning and at bedtime.    . Cholecalciferol (DIALYVITE VITAMIN D 5000) 125 MCG (5000 UT) capsule Take 5,000 Units by mouth daily.    [name] ibuprofen (ADVIL) 200 MG tablet Take 400 mg by mouth every 6 (six) hours as needed for moderate pain.    . Magnesium 400 MG CAPS Take 400 mg by mouth daily.    . Omega-3 1400 MG CAPS Take 1,400 mg by mouth daily.    . pravastatin (PRAVACHOL) 40 MG tablet Take 40 mg by mouth.     Current Facility-Administered Medications  Medication Dose Route Frequency Provider Last Rate Last Admin  . 0.9 %  sodium chloride infusion  500 mL Intravenous Once Armbruster, Carlota Raspberry, MD          Physical Exam:   BP 119/65 (BP Location: Left Arm, Patient Position: Sitting, Cuff Size: Normal)   Pulse 69   Temp (!) 97.3 F (36.3 C)   Resp 16   Ht 5\' 9"  (1.753 m)   Wt 158 lb (71.7 kg)   SpO2 94%   BMI 23.33 kg/m   General:  Well-appearing  Chest:   Clear to auscultation  CV:   Regular rate and rhythm with systolic murmur  Incisions:  n/a  Abdomen:  Soft nontender  Extremities:  Warm and  well-perfused  Diagnostic Tests:  n/a   Impression:  Patient has bicuspid aortic valve with stage D severe symptomatic aortic stenosis.  Patient describes recent onset of symptoms of increased fatigue as well as exercise-induced dizzy spells and a single near syncopal event.  He is otherwise remarkably healthy and quite active physically.  I have personally reviewed the patient's recent transthoracic echocardiogram, diagnostic cardiac catheterization, and CT angiograms.  Patient has what appears to be a bicuspid aortic valve which is heavily calcified including asymmetric calcification of the leaflets.  Peak velocity across the aortic valve was measured 4.2 m/s corresponding to mean transvalvular gradient estimated 42 mmHg.  Left ventricular systolic function remains normal.  Diagnostic cardiac catheterization is notable for the absence of significant coronary artery disease and reveals normal right heart pressures.  I agree the patient needs aortic valve replacement.  Cardiac gated CT angiogram of the heart reveals findings consistent with bicuspid aortic valve with heavy and asymmetric leaflet calcification which would likely significantly increase risk of paravalvular leak.  The patient also has baseline right bundle branch block on EKG.  Risks associated with conventional surgery would be expected to be very  low and the patient appears to be a good candidate for minimally invasive approach for surgery.  Under the circumstances I agree conventional surgery would likely be the best approach.    Plan:  The patient was again counseled at length regarding treatment alternatives for the management of severe symptomatic aortic stenosis. Alternative approaches such as conventional aortic valve replacement, transcatheter aortic valve replacement, and continued medical therapy without intervention were compared and contrasted at length.  The risks associated with conventional surgical aortic valve  replacement were discussed in detail, as were alternative surgical approaches, prosthetic valve choices, and expectations for the patient's post-operative convalescence.  The patient requests to proceed with conventional surgical aortic valve replacement via right minithoracotomy approach.  We plan to proceed with surgery on July 22, 2019 as previously planned.  The patient understands and accepts all potential associated risks of surgery including but not limited to risk of death, stroke, myocardial infarction, congestive heart failure, respiratory failure, renal failure, pneumonia, bleeding requiring blood transfusion and or reexploration, arrhythmia, heart block or bradycardia requiring permanent pacemaker, aortic dissection or other major vascular complication, pleural effusions or other delayed complications related to continued congestive heart failure, and other late complications related to valve replacement including structural valve deterioration and failure, thrombosis, endocarditis, or paravalvular leak.  Specific risks potentially related to the minimally-invasive approach were discussed at length, including but not limited to risk of conversion to full or partial sternotomy, aortic dissection or other major vascular complication, unilateral acute lung injury or pulmonary edema, phrenic nerve dysfunction or paralysis, rib fracture, chronic pain, lung hernia, or lymphocele.     I spent in excess of 15 minutes during the conduct of this office consultation and >50% of this time involved direct face-to-face encounter with the patient for counseling and/or coordination of their care.    Valentina Gu. Roxy Manns, MD 07/19/2019 1:04 PM

## 2019-07-22 MED ORDER — MILRINONE LACTATE IN DEXTROSE 20-5 MG/100ML-% IV SOLN
0.3000 ug/kg/min | INTRAVENOUS | Status: DC
  Filled 2019-07-22: qty 100

## 2019-07-22 MED ORDER — DEXMEDETOMIDINE HCL IN NACL 400 MCG/100ML IV SOLN
0.1000 ug/kg/h | INTRAVENOUS | Status: AC
  Administered 2019-07-23: 10:00:00 .5 ug/kg/h via INTRAVENOUS
  Filled 2019-07-22: qty 100

## 2019-07-22 MED ORDER — PHENYLEPHRINE HCL-NACL 20-0.9 MG/250ML-% IV SOLN
30.0000 ug/min | INTRAVENOUS | Status: AC
  Administered 2019-07-23: 50 ug/min via INTRAVENOUS
  Filled 2019-07-22: qty 250

## 2019-07-22 MED ORDER — NITROGLYCERIN IN D5W 200-5 MCG/ML-% IV SOLN
2.0000 ug/min | INTRAVENOUS | Status: DC
  Filled 2019-07-22: qty 250

## 2019-07-22 MED ORDER — TRANEXAMIC ACID (OHS) BOLUS VIA INFUSION
15.0000 mg/kg | INTRAVENOUS | Status: AC
  Administered 2019-07-23: 1075.5 mg via INTRAVENOUS
  Filled 2019-07-22: qty 1076

## 2019-07-22 MED ORDER — VANCOMYCIN HCL 1000 MG IV SOLR
INTRAVENOUS | Status: DC
  Filled 2019-07-22: qty 1000

## 2019-07-22 MED ORDER — MANNITOL 20 % IV SOLN
INTRAVENOUS | Status: DC
  Filled 2019-07-22: qty 13

## 2019-07-22 MED ORDER — EPINEPHRINE HCL 5 MG/250ML IV SOLN IN NS
0.0000 ug/min | INTRAVENOUS | Status: DC
  Filled 2019-07-22: qty 250

## 2019-07-22 MED ORDER — TRANEXAMIC ACID (OHS) PUMP PRIME SOLUTION
2.0000 mg/kg | INTRAVENOUS | Status: DC
  Filled 2019-07-22: qty 1.43

## 2019-07-22 MED ORDER — INSULIN REGULAR(HUMAN) IN NACL 100-0.9 UT/100ML-% IV SOLN
INTRAVENOUS | Status: AC
  Administered 2019-07-23: 1.1 [IU]/h via INTRAVENOUS
  Filled 2019-07-22: qty 100

## 2019-07-22 MED ORDER — POTASSIUM CHLORIDE 2 MEQ/ML IV SOLN
80.0000 meq | INTRAVENOUS | Status: DC
  Filled 2019-07-22: qty 40

## 2019-07-22 MED ORDER — SODIUM CHLORIDE 0.9 % IV SOLN
1.5000 g | INTRAVENOUS | Status: AC
  Administered 2019-07-23: 08:00:00 1.5 g via INTRAVENOUS
  Filled 2019-07-22: qty 1.5

## 2019-07-22 MED ORDER — NOREPINEPHRINE 4 MG/250ML-% IV SOLN
0.0000 ug/min | INTRAVENOUS | Status: DC
  Filled 2019-07-22: qty 250

## 2019-07-22 MED ORDER — SODIUM CHLORIDE 0.9 % IV SOLN
INTRAVENOUS | Status: DC
  Filled 2019-07-22: qty 30

## 2019-07-22 MED ORDER — SODIUM CHLORIDE 0.9 % IV SOLN
750.0000 mg | INTRAVENOUS | Status: AC
  Administered 2019-07-23: 750 mg via INTRAVENOUS
  Filled 2019-07-22: qty 750

## 2019-07-22 MED ORDER — PLASMA-LYTE 148 IV SOLN
INTRAVENOUS | Status: DC
  Filled 2019-07-22: qty 2.5

## 2019-07-22 MED ORDER — TRANEXAMIC ACID 1000 MG/10ML IV SOLN
1.5000 mg/kg/h | INTRAVENOUS | Status: AC
  Administered 2019-07-23: 1.5 mg/kg/h via INTRAVENOUS
  Filled 2019-07-22: qty 25

## 2019-07-22 MED ORDER — VANCOMYCIN HCL 1250 MG/250ML IV SOLN
1250.0000 mg | INTRAVENOUS | Status: AC
  Administered 2019-07-23: 1250 mg via INTRAVENOUS
  Filled 2019-07-22: qty 250

## 2019-07-22 NOTE — H&P (Signed)
[address]       ,[place] [address]             [phone]          CARDIOTHORACIC SURGERY HISTORY AND PHYSICAL EXAM  Referring Provider is Sherren Mocha, MD PCP is Jefm Petty, MD  Chief Complaint  Patient presents with  . Aortic Stenosis    TAVR EVAL and review all required studies and procedures     HPI:  Patient is a 69 year old previously healthy male who has been referred for surgical consultation to discuss treatment options for management of severe symptomatic aortic stenosis.  Patient states that he was first noted to have a murmur on physical exam more than 10 years ago.  He denies any known history of rheumatic fever during childhood.  He was diagnosed with aortic stenosis and has been followed by his primary care physician with intermittent echocardiograms.  Transthoracic echocardiogram performed in 2018 reportedly revealed normal left ventricular systolic function with ejection fraction estimated 65 to 70% and moderate aortic stenosis with peak and mean transvalvular gradient estimated 41 and 23 mmHg respectively, aortic valve area calculated 1.0 cm, and DVI reported 0.37.  Follow-up echocardiogram performed in 2020 revealed significant progression of disease with peak and mean transvalvular gradients estimated 71 and 43 mmHg respectively and DVI reported only 0.24.  Up until recently the patient has remained asymptomatic.  However, the patient recently developed a near syncopal episode while exercising.  He was initially evaluated at [facility] where he was told he would likely need surgery within the next 6 months to 2 years.  He was referred to Dr. Burt Knack for a second opinion and underwent diagnostic cardiac catheterization on June 08, 2019 which revealed mild nonobstructive coronary artery disease.  Transvalvular gradient across aortic valve not assessed.  Right heart pressures were normal.  Treatment options were discussed and  the patient underwent CT angiography to evaluate the feasibility of transcatheter aortic valve replacement.  The patient was referred for surgical consultation.  Patient is single and lives locally in [place].  He does not have children.  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  He exercises on a daily basis.  He has a brother who lives in [place] but no other family close by.  He is accompanied by a close friend for his office consultation visit today.  He states that up until recently he has remained very active physically and not experienced any sort of symptoms.  However, he now states that he has noted a tendency to fatigue more easily.  He suffered a near syncopal episode while exercising recently while jogging with light weights around his ankles.  He states that it took him quite a while to recover.  Since then he has had a couple other mild dizzy spells and atypical sharp pains across his chest that seem to come and go sporadically.  He denies any significant shortness of breath either with activity or rest.  He has not had PND, orthopnea, or lower extremity edema.  Patient was initially seen in consultation on June 21, 2019 at which time we made tentative plans to proceed with surgery on July 22, 2019.  He reports no new problems or complaints over the last few weeks and is eager to proceed with surgery as previously planned.   Past Medical History:  Diagnosis Date  . Aortic stenosis   . Benign prostatic hyperplasia   . Bronchitis   .  Cataract    bilateral   . CHF (congestive heart failure) ([place])   . Colon polyps   . Heart murmur   . History of fracture of vertebral column   . Hyperlipidemia   . Kidney stone   . Nonrheumatic aortic valve stenosis   . Osteoporosis   . Osteoporosis   . Pure hypercholesterolemia   . RBBB   . Sensorineural hearing loss of both ears   . Thrombosed hemorrhoids    hsitory of  . Tubular adenoma of colon   . Vitamin D deficiency       Past Surgical History:  Procedure Laterality Date  . COLON SURGERY  2006   1 foot ascending colon removed   . COLONOSCOPY    . HERNIA REPAIR    . POLYPECTOMY    . RIGHT HEART CATH AND CORONARY ANGIOGRAPHY N/A 06/08/2019   Procedure: RIGHT HEART CATH AND CORONARY ANGIOGRAPHY;  Surgeon: Sherren Mocha, MD;  Location: [facility];  Service: Cardiovascular;  Laterality: N/A;  . TESTICLE SURGERY      Family History  Problem Relation Age of Onset  . Colon cancer Neg Hx   . Colon polyps Neg Hx   . Esophageal cancer Neg Hx   . Rectal cancer Neg Hx   . Stomach cancer Neg Hx     Social History Social History   Tobacco Use  . Smoking status: Never Smoker  . Smokeless tobacco: Never Used  Substance Use Topics  . Alcohol use: Yes    Comment: Moderately, 2 bottles wine per week  . Drug use: No    Prior to Admission medications   Medication Sig Start Date End Date Taking? Authorizing Provider  acetaminophen (TYLENOL) 500 MG tablet Take 500 mg by mouth every 8 (eight) hours as needed for moderate pain or headache.   Yes [provider]  Calcium Citrate-Vitamin D (CITRACAL + D PO) Take 2 tablets by mouth in the morning and at bedtime.   Yes [provider]  Cholecalciferol (DIALYVITE VITAMIN D 5000) 125 MCG (5000 UT) capsule Take 5,000 Units by mouth daily.   Yes [provider]  ibuprofen (ADVIL) 200 MG tablet Take 400 mg by mouth every 6 (six) hours as needed for moderate pain.   Yes [provider]  Magnesium 400 MG CAPS Take 400 mg by mouth daily.   Yes [provider]  Omega-3 1400 MG CAPS Take 1,400 mg by mouth daily.   Yes [provider]  pravastatin (PRAVACHOL) 40 MG tablet Take 40 mg by mouth. 06/18/16  Yes [provider]    Allergies  Allergen Reactions  . Terbinafine Hcl Other (See Comments)    Elevated LFT Lamisil        Review of Systems:              General:                       normal appetite, decreased energy, no weight gain, no weight loss, no fever             Cardiac:                       no chest pain with exertion, no chest pain at rest, no SOB with exertion, no resting SOB, no PND, no orthopnea, + palpitations, no arrhythmia, no atrial fibrillation, no LE edema, + dizzy spells, near syncope  Respiratory:                 no shortness of breath, no home oxygen, no productive cough, no dry cough, no bronchitis, no wheezing, no hemoptysis, no asthma, no pain with inspiration or cough, no sleep apnea, no CPAP at night             GI:                               no difficulty swallowing, no reflux, no frequent heartburn, no hiatal hernia, no abdominal pain, no constipation, no diarrhea, no hematochezia, no hematemesis, no melena             GU:                              no dysuria,  no frequency, no urinary tract infection, no hematuria, no enlarged prostate, no kidney stones, no kidney disease             Vascular:                     no pain suggestive of claudication, no pain in feet, no leg cramps, no varicose veins, no DVT, no non-healing foot ulcer             Neuro:                         no stroke, no TIA's, no seizures, no headaches, no temporary blindness one eye,  no slurred speech, no peripheral neuropathy, no chronic pain, no instability of gait, no memory/cognitive dysfunction             Musculoskeletal:         no arthritis, no joint swelling, no myalgias, no difficulty walking, normal mobility              Skin:                            no rash, no itching, no skin infections, no pressure sores or ulcerations             Psych:                         no anxiety, no depression, no nervousness, no unusual recent stress             Eyes:                           no blurry vision, no floaters, no recent vision changes, does not wear glasses or contacts             ENT:                            no hearing loss, no loose or painful teeth, no  dentures, last saw dentist within the past year             Hematologic:               no easy bruising, no abnormal bleeding, no clotting disorder, no frequent epistaxis             Endocrine:  no diabetes, does not check CBG's at home                                                       Physical Exam:              BP 133/89 (BP Location: Right Arm, Patient Position: Sitting, Cuff Size: Normal)   Pulse 67   Temp 97.7 F (36.5 C)   Resp 16   Ht 5' 9.5" (1.765 m)   Wt 159 lb (72.1 kg)   SpO2 98% Comment: REA  BMI 23.14 kg/m              General:                      Thin,  well-appearing             HEENT:                       Unremarkable              Neck:                           no JVD, no bruits, no adenopathy              Chest:                          clear to auscultation, symmetrical breath sounds, no wheezes, no rhonchi              CV:                              RRR, grade III/VI crescendo/decrescendo murmur heard best at RSB,  no diastolic murmur             Abdomen:                    soft, non-tender, no masses              Extremities:                 warm, well-perfused, pulses palpabld, no LE edema             Rectal/GU                   Deferred             Neuro:                         Grossly non-focal and symmetrical throughout             Skin:                            Clean and dry, no rashes, no breakdown   Diagnostic Tests:  TRANSTHORACIC ECHOCARDIOGRAM  Report from transthoracic echocardiogram performed at [facility] on January 28, 2019 is reviewed.  By report there was normal left ventricular systolic function with ejection fraction estimated 55 to 60%.  There was severe aortic stenosis with peak velocity across aortic  valve measured 4.2 m/s corresponding to mean transvalvular gradient estimated 43 mmHg and aortic valve area calculated only 0.68 cm.  DVI was reported 0.24.  RIGHT HEART CATH AND  CORONARY ANGIOGRAPHY  Conclusion  1. Mild nonobstructive CAD without any high-grade or flow-limiting stenoses 2. Bulky aortic valve calcification with restricted leaflet mobility 3. Normal right heart pressures/preserved cardiac output  Plan: CTA studies, cardiac surgical evaluation for consideration of aortic valve replacement  Indications  Severe aortic stenosis [I35.0 (ICD-10-CM)]  Procedural Details  Technical Details INDICATION: Severe symptomatic aortic stenosis (preoperative assessment)  PROCEDURAL DETAILS: There was an indwelling IV in a right antecubital vein. Using normal sterile technique, the IV was changed out for a 5 Fr brachial sheath over a 0.018 inch wire. The right wrist was then prepped, draped, and anesthetized with 1% lidocaine. Using the modified Seldinger technique a 5/6 French Slender sheath was placed in the right radial artery. Intra-arterial verapamil was administered through the radial artery sheath. IV heparin was administered after a JR4 catheter was advanced into the central aorta. A Swan-Ganz catheter was used for the right heart catheterization. Standard protocol was followed for recording of right heart pressures and sampling of oxygen saturations. Fick cardiac output was calculated. Standard Judkins catheters were used for selective coronary angiography. There were no immediate procedural complications. The patient was transferred to the post catheterization recovery area for further monitoring.    Estimated blood loss <50 mL.   During this procedure medications were administered to achieve and maintain moderate conscious sedation while the patient's heart rate, blood pressure, and oxygen saturation were continuously monitored and I was present face-to-face 100% of this time.  Medications (Filter: Administrations occurring from 06/08/19 1242 to 06/08/19 1331) Heparin (Porcine) in NaCl 1000-0.9 UT/500ML-% SOLN (mL) Total volume:  1,000 mL Date/Time   Rate/Dose/Volume Action  06/08/19 1248  500 mL Given  1248  500 mL Given    midazolam (VERSED) injection (mg) Total dose:  2 mg Date/Time  Rate/Dose/Volume Action  06/08/19 1305  2 mg Given    fentaNYL (SUBLIMAZE) injection (mcg) Total dose:  25 mcg Date/Time  Rate/Dose/Volume Action  06/08/19 1305  25 mcg Given    lidocaine (PF) (XYLOCAINE) 1 % injection (mL) Total volume:  4 mL Date/Time  Rate/Dose/Volume Action  06/08/19 1306  2 mL Given  1307  2 mL Given    Radial Cocktail/Verapamil only (mL) Total volume:  10 mL Date/Time  Rate/Dose/Volume Action  06/08/19 1307  10 mL Given    heparin injection (Units) Total dose:  3,500 Units Date/Time  Rate/Dose/Volume Action  06/08/19 1313  3,500 Units Given    iohexol (OMNIPAQUE) 350 MG/ML injection (mL) Total volume:  30 mL Date/Time  Rate/Dose/Volume Action  06/08/19 1323  30 mL Given    Sedation Time  Sedation Time Physician-1: 14 minutes 50 seconds  Contrast  Medication Name Total Dose  iohexol (OMNIPAQUE) 350 MG/ML injection 30 mL    Radiation/Fluoro  Fluoro time: 3 (min) DAP: 8114 (mGycm2) Cumulative Air Kerma: 148 (mGy)  Coronary Findings  Diagnostic Dominance: Right Left Anterior Descending  Mid LAD lesion 25% stenosed  Mid LAD lesion is 25% stenosed.  First Diagonal Branch  Large first diagonal without significant stenosis  Left Circumflex  The vessel exhibits minimal luminal irregularities. The circumflex supplies a large area of myocardium. The OM branches are all patent without significant stenoses.  Right Coronary Artery  Ost RCA lesion 30% stenosed  Ost RCA lesion is 30% stenosed. mild nonobstructive ostial  RCA stenosis  Intervention  No interventions have been documented. Left Heart  Aortic Valve There is severe aortic valve stenosis. The aortic valve is calcified. There is restricted aortic valve motion.  Coronary Diagrams  Diagnostic Dominance:  Right  Intervention  Implants      No implant documentation for this case.  Syngo Images  Show images for CARDIAC CATHETERIZATION  Images on Long Term Storage  Show images for Radell, Racer "[name]"   Link to Procedure Log  Procedure Log    Hemo Data   Most Recent Value  Fick Cardiac Output 7.21 L/min  Fick Cardiac Output Index 3.84 (L/min)/BSA  RA A Wave 10 mmHg  RA V Wave 4 mmHg  RA Mean 2 mmHg  RV Systolic Pressure 32 mmHg  RV Diastolic Pressure 0 mmHg  RV EDP 6 mmHg  PA Systolic Pressure 23 mmHg  PA Diastolic Pressure 7 mmHg  PA Mean 14 mmHg  PW A Wave 22 mmHg  PW V Wave 18 mmHg  PW Mean 13 mmHg  AO Systolic Pressure 123456 mmHg  AO Diastolic Pressure 70 mmHg  AO Mean 90 mmHg  QP/QS 1  TPVR Index 3.65 HRUI  TSVR Index 23.48 HRUI  PVR SVR Ratio 0.01  TPVR/TSVR Ratio 0.16   Cardiac TAVR CT  TECHNIQUE: The patient was scanned on a [employer]. A 120 kV retrospective scan was triggered in the descending thoracic aorta at 111 HU's. Gantry rotation speed was 250 msecs and collimation was .6 mm. No beta blockade or nitro were given. The 3D data set was reconstructed in 5% intervals of the R-R cycle. Systolic and diastolic phases were analyzed on a dedicated work station using MPR, MIP and VRT modes. The patient received 80 cc of contrast.  FINDINGS: Aortic Valve: Bicuspid aortic valve with severely calcified leaflets and severely restricted leaflet opening, significant calcifications are extending into the LVOT. Aortic valve calcium score 3747.  Aorta: Normal size, minimal plaque, no calcifications and no dissection.  Sinotubular Junction: 25.3 x 24.4 mm  Ascending Thoracic Aorta: 33.2 x 32.4 mm  Aortic Arch: 25.2 x 24.5 mm  Descending Thoracic Aorta: 25.9 x 23.2 mm  Sinus of Valsalva Measurements: 33.7 x 28.6 mm  Coronary Artery Height above Annulus:  Left Main: 13.4 mm  Right Coronary: 19.3 mm  Virtual Basal  Annulus Measurements:  Maximum/Minimum Diameter: 27.2 x 22.7 mm  Mean Diameter: 25.0 mm  Perimeter: 80.2 mm  Area: 491 mm2  IMPRESSION: 1. Bicuspid aortic valve with severely calcified leaflets and severely restricted leaflet opening, significant calcifications are extending into the LVOT. Aortic valve calcium score 3747.  2. Sufficient coronary to annulus distance.  3. No thrombus in the left atrial appendage.   Electronically Signed By: Ena Dawley On: 06/19/2019 01:10    CT ANGIOGRAPHY CHEST, ABDOMEN AND PELVIS  TECHNIQUE: Multidetector CT imaging through the chest, abdomen and pelvis was performed using the standard protocol during bolus administration of intravenous contrast. Multiplanar reconstructed images and MIPs were obtained and reviewed to evaluate the vascular anatomy.  CONTRAST: 140mL OMNIPAQUE IOHEXOL 350 MG/ML SOLN  COMPARISON: 01/28/2012 CT abdomen/pelvis.  FINDINGS: CTA CHEST FINDINGS  Cardiovascular: Top-normal heart size. No significant pericardial effusion/thickening. Coarse calcification and diffuse thickening of the aortic valve. Minimally atherosclerotic nonaneurysmal thoracic aorta. Aortic arch branch vessels are patent. Normal caliber pulmonary arteries. No central pulmonary emboli.  Mediastinum/Nodes: No discrete thyroid nodules. Mildly patulous thoracic esophagus with small esophageal fluid level. No pathologically enlarged axillary, mediastinal or hilar lymph nodes.  Lungs/Pleura: No pneumothorax. No pleural effusion. Small curvilinear parenchymal band at the peripheral right lung base compatible with mild scarring. Apical left upper lobe 4 mm solid pulmonary nodule (series 16/image 19). No acute consolidative airspace disease, lung masses or additional significant pulmonary nodules.  Musculoskeletal: No aggressive appearing focal osseous lesions. Mild thoracic spondylosis.  CTA ABDOMEN AND  PELVIS FINDINGS  Hepatobiliary: Normal liver with no liver mass. Normal gallbladder with no radiopaque cholelithiasis. No biliary ductal dilatation.  Pancreas: Normal, with no mass or duct dilation.  Spleen: Normal size. No mass.  Adrenals/Urinary Tract: Normal adrenals. No hydronephrosis. Nonobstructing 4 mm lower left renal stone. Simple left renal cysts, largest 1.9 cm in the upper left kidney. Cyst no additional contour deforming renal lesions. Normal bladder.  Stomach/Bowel: Normal non-distended stomach. Stable postsurgical changes in the ileocecal region. No dilated or thick-walled small bowel loops. Appendix not discretely visualized. No large bowel wall thickening, diverticulosis or acute pericolonic fat stranding.  Vascular/Lymphatic: Minimally atherosclerotic nonaneurysmal abdominal aorta. Patent renal and splenic veins. No pathologically enlarged lymph nodes in the abdomen or pelvis.  Reproductive: Normal size prostate with nonspecific internal prostatic calcifications.  Other: No pneumoperitoneum, ascites or focal fluid collection.  Musculoskeletal: No aggressive appearing focal osseous lesions. Mild lumbar spondylosis.  VASCULAR MEASUREMENTS PERTINENT TO TAVR:  AORTA:  Minimal Aortic Diameter-14.1 x 13.5 mm  Severity of Aortic Calcification-mild  RIGHT PELVIS:  Right Common Iliac Artery -  Minimal Diameter-8.7 x 8.7 mm  Tortuosity-mild  Calcification-mild  Right External Iliac Artery -  Minimal Diameter-8.6 x 8.4 mm  Tortuosity-moderate  Calcification-none  Right Common Femoral Artery -  Minimal Diameter-8.1 x 8.0 mm  Tortuosity-mild  Calcification-mild  LEFT PELVIS:  Left Common Iliac Artery -  Minimal Diameter-9.7 x 9.6 mm  Tortuosity-mild  Calcification-mild  Left External Iliac Artery -  Minimal Diameter-8.7 x 8.6 mm  Tortuosity-moderate  Calcification-none  Left Common Femoral  Artery -  Minimal Diameter-9.0 x 7.5 mm  Tortuosity-mild  Calcification-mild  Review of the MIP images confirms the above findings.  IMPRESSION: 1. Vascular findings and measurements pertinent to potential TAVR procedure, as detailed. 2. Coarse calcification and diffuse thickening of the aortic valve, compatible with the reported clinical history of severe aortic stenosis. 3. Apical left upper lobe 4 mm solid pulmonary nodule. No follow-up needed if patient is low-risk. Non-contrast chest CT can be considered in 12 months if patient is high-risk. This recommendation follows the consensus statement: Guidelines for Management of Incidental Pulmonary Nodules Detected on CT Images: From the Fleischner Society 2017; Radiology 2017; 284:228-243. 4. Mildly patulous thoracic esophagus with small esophageal fluid level, suggesting esophageal dysmotility and/or gastroesophageal reflux. 5. Nonobstructing left nephrolithiasis. 6. Aortic Atherosclerosis (ICD10-I70.0).   Electronically Signed By: Ilona Sorrel M.D. On: 06/11/2019 12:02    Impression:  Patient has bicuspid aortic valve with stage D severe symptomatic aortic stenosis. Patient describes recent onset of symptoms of increased fatigue as well as exercise-induced dizzy spells and a single near syncopal event. He is otherwise remarkably healthy and quite active physically.  I have personally reviewed the patient's recent transthoracic echocardiogram, diagnostic cardiac catheterization, and CT angiograms. Patient has what appears to be a bicuspid aortic valve which is heavily calcified including asymmetric calcification of the leaflets. Peak velocity across the aortic valve was measured 4.2 m/s corresponding to mean transvalvular gradient estimated 42 mmHg. Left ventricular systolic function remains normal. Diagnostic cardiac catheterization is notable for the absence of significant coronary artery disease and  reveals normal right heart  pressures.  I agree the patient needs aortic valve replacement. Cardiac gated CT angiogram of the heart reveals findings consistent with bicuspid aortic valve with heavy and asymmetric leaflet calcification which would likely significantly increase risk of paravalvular leak. The patient also has baseline right bundle branch block on EKG. Risks associated with conventional surgery would be expected to be very low and the patient appears to be a good candidate for minimally invasive approach for surgery. Under the circumstances I agree conventional surgery would likely be the best approach.    Plan:  The patientwas againcounseled at length regarding treatment alternatives for the management of severe symptomatic aortic stenosis. Alternative approaches such as conventional aortic valve replacement, transcatheter aortic valve replacement, and continued medical therapy without intervention were compared and contrasted at length. The risks associated with conventional surgical aortic valve replacement were discussed in detail, as were alternative surgical approaches, prosthetic valve choices, andexpectations for the patient's post-operative convalescence. The patient requests to proceed with conventional surgical aortic valve replacement via right minithoracotomy approach. We plan to proceed with surgery on July 22, 2019 as previously planned.  The patient understands and accepts all potential associated risks of surgery including but not limited to risk of death, stroke, myocardial infarction, congestive heart failure, respiratory failure, renal failure, pneumonia, bleeding requiring blood transfusion and or reexploration, arrhythmia, heart block or bradycardia requiring permanent pacemaker, aortic dissection or other major vascular complication, pleural effusions or other delayed complications related to continued congestive heart failure, and other late  complications related to valve replacement including structural valve deterioration and failure, thrombosis, endocarditis, or paravalvular leak.  Specific risks potentially related to the minimally-invasive approach were discussed at length, including but not limited to risk of conversion to full or partial sternotomy, aortic dissection or other major vascular complication, unilateral acute lung injury or pulmonary edema, phrenic nerve dysfunction or paralysis, rib fracture, chronic pain, lung hernia, or lymphocele.      Valentina Gu. Roxy Manns, MD 07/19/2019 1:04 PM

## 2019-07-23 ENCOUNTER — Inpatient Hospital Stay (HOSPITAL_COMMUNITY): Payer: Medicare Other

## 2019-07-23 ENCOUNTER — Inpatient Hospital Stay (HOSPITAL_COMMUNITY): Payer: Medicare Other | Admitting: Anesthesiology

## 2019-07-23 ENCOUNTER — Inpatient Hospital Stay (HOSPITAL_COMMUNITY): Payer: Medicare Other | Admitting: Certified Registered"

## 2019-07-23 ENCOUNTER — Encounter (HOSPITAL_COMMUNITY): Payer: Self-pay | Admitting: Thoracic Surgery (Cardiothoracic Vascular Surgery)

## 2019-07-23 ENCOUNTER — Other Ambulatory Visit: Payer: Self-pay

## 2019-07-23 ENCOUNTER — Inpatient Hospital Stay (HOSPITAL_COMMUNITY)
Admission: RE | Admit: 2019-07-23 | Discharge: 2019-08-01 | DRG: 220 | Disposition: A | Discharge status: Home / Self Care | Payer: Medicare Other | Attending: Thoracic Surgery (Cardiothoracic Vascular Surgery) | Admitting: Thoracic Surgery (Cardiothoracic Vascular Surgery)

## 2019-07-23 ENCOUNTER — Encounter (HOSPITAL_COMMUNITY)
Admission: RE | Disposition: A | Discharge status: Home / Self Care | Payer: Self-pay | Source: Home / Self Care | Attending: Thoracic Surgery (Cardiothoracic Vascular Surgery)

## 2019-07-23 DIAGNOSIS — X509XXA Other and unspecified overexertion or strenuous movements or postures, initial encounter: Secondary | ICD-10-CM | POA: Diagnosis not present

## 2019-07-23 DIAGNOSIS — M810 Age-related osteoporosis without current pathological fracture: Secondary | ICD-10-CM | POA: Diagnosis present

## 2019-07-23 DIAGNOSIS — I509 Heart failure, unspecified: Secondary | ICD-10-CM | POA: Diagnosis present

## 2019-07-23 DIAGNOSIS — S0181XA Laceration without foreign body of other part of head, initial encounter: Secondary | ICD-10-CM | POA: Diagnosis not present

## 2019-07-23 DIAGNOSIS — Q231 Congenital insufficiency of aortic valve: Principal | ICD-10-CM

## 2019-07-23 DIAGNOSIS — Z978 Presence of other specified devices: Secondary | ICD-10-CM

## 2019-07-23 DIAGNOSIS — I4819 Other persistent atrial fibrillation: Secondary | ICD-10-CM | POA: Diagnosis present

## 2019-07-23 DIAGNOSIS — Y92230 Patient room in hospital as the place of occurrence of the external cause: Secondary | ICD-10-CM | POA: Diagnosis not present

## 2019-07-23 DIAGNOSIS — E785 Hyperlipidemia, unspecified: Secondary | ICD-10-CM | POA: Diagnosis present

## 2019-07-23 DIAGNOSIS — I4891 Unspecified atrial fibrillation: Secondary | ICD-10-CM | POA: Diagnosis not present

## 2019-07-23 DIAGNOSIS — Z888 Allergy status to other drugs, medicaments and biological substances status: Secondary | ICD-10-CM

## 2019-07-23 DIAGNOSIS — I442 Atrioventricular block, complete: Secondary | ICD-10-CM | POA: Diagnosis present

## 2019-07-23 DIAGNOSIS — Z953 Presence of xenogenic heart valve: Secondary | ICD-10-CM

## 2019-07-23 DIAGNOSIS — R338 Other retention of urine: Secondary | ICD-10-CM | POA: Diagnosis not present

## 2019-07-23 DIAGNOSIS — E559 Vitamin D deficiency, unspecified: Secondary | ICD-10-CM | POA: Diagnosis present

## 2019-07-23 DIAGNOSIS — N401 Enlarged prostate with lower urinary tract symptoms: Secondary | ICD-10-CM | POA: Diagnosis not present

## 2019-07-23 DIAGNOSIS — Z87442 Personal history of urinary calculi: Secondary | ICD-10-CM | POA: Diagnosis not present

## 2019-07-23 DIAGNOSIS — D62 Acute posthemorrhagic anemia: Secondary | ICD-10-CM | POA: Diagnosis not present

## 2019-07-23 DIAGNOSIS — Z952 Presence of prosthetic heart valve: Secondary | ICD-10-CM

## 2019-07-23 DIAGNOSIS — I2510 Atherosclerotic heart disease of native coronary artery without angina pectoris: Secondary | ICD-10-CM | POA: Diagnosis present

## 2019-07-23 DIAGNOSIS — H903 Sensorineural hearing loss, bilateral: Secondary | ICD-10-CM | POA: Diagnosis present

## 2019-07-23 DIAGNOSIS — S01112A Laceration without foreign body of left eyelid and periocular area, initial encounter: Secondary | ICD-10-CM | POA: Diagnosis not present

## 2019-07-23 DIAGNOSIS — Z79899 Other long term (current) drug therapy: Secondary | ICD-10-CM

## 2019-07-23 DIAGNOSIS — I4510 Unspecified right bundle-branch block: Secondary | ICD-10-CM | POA: Diagnosis present

## 2019-07-23 DIAGNOSIS — I495 Sick sinus syndrome: Secondary | ICD-10-CM | POA: Diagnosis present

## 2019-07-23 DIAGNOSIS — Z09 Encounter for follow-up examination after completed treatment for conditions other than malignant neoplasm: Secondary | ICD-10-CM

## 2019-07-23 DIAGNOSIS — D6959 Other secondary thrombocytopenia: Secondary | ICD-10-CM | POA: Diagnosis not present

## 2019-07-23 DIAGNOSIS — I350 Nonrheumatic aortic (valve) stenosis: Secondary | ICD-10-CM | POA: Diagnosis present

## 2019-07-23 DIAGNOSIS — Z959 Presence of cardiac and vascular implant and graft, unspecified: Secondary | ICD-10-CM

## 2019-07-23 DIAGNOSIS — W19XXXA Unspecified fall, initial encounter: Secondary | ICD-10-CM

## 2019-07-23 DIAGNOSIS — W1830XA Fall on same level, unspecified, initial encounter: Secondary | ICD-10-CM | POA: Diagnosis not present

## 2019-07-23 DIAGNOSIS — J90 Pleural effusion, not elsewhere classified: Secondary | ICD-10-CM

## 2019-07-23 DIAGNOSIS — I4892 Unspecified atrial flutter: Secondary | ICD-10-CM | POA: Diagnosis not present

## 2019-07-23 DIAGNOSIS — S12500A Unspecified displaced fracture of sixth cervical vertebra, initial encounter for closed fracture: Secondary | ICD-10-CM | POA: Diagnosis present

## 2019-07-23 DIAGNOSIS — Z9289 Personal history of other medical treatment: Secondary | ICD-10-CM

## 2019-07-23 DIAGNOSIS — J9811 Atelectasis: Secondary | ICD-10-CM

## 2019-07-23 DIAGNOSIS — I441 Atrioventricular block, second degree: Secondary | ICD-10-CM | POA: Diagnosis not present

## 2019-07-23 HISTORY — DX: Presence of xenogenic heart valve: Z95.3

## 2019-07-23 HISTORY — PX: AORTIC VALVE REPLACEMENT: SHX41

## 2019-07-23 HISTORY — PX: TEE WITHOUT CARDIOVERSION: SHX5443

## 2019-07-23 LAB — CBC
HCT: 41.6 % (ref 39.0–52.0)
HCT: 38.8 % — ABNORMAL LOW (ref 39.0–52.0)
Hemoglobin: 13.8 g/dL (ref 13.0–17.0)
Hemoglobin: 13 g/dL (ref 13.0–17.0)
MCH: 30.7 pg (ref 26.0–34.0)
MCH: 31 pg (ref 26.0–34.0)
MCHC: 33.2 g/dL (ref 30.0–36.0)
MCHC: 33.5 g/dL (ref 30.0–36.0)
MCV: 92.7 fL (ref 80.0–100.0)
MCV: 92.4 fL (ref 80.0–100.0)
Platelets: 100 10*3/uL — ABNORMAL LOW (ref 150–400)
Platelets: 74 10*3/uL — ABNORMAL LOW (ref 150–400)
RBC: 4.49 MIL/uL (ref 4.22–5.81)
RBC: 4.2 MIL/uL — ABNORMAL LOW (ref 4.22–5.81)
RDW: 12.6 % (ref 11.5–15.5)
RDW: 13 % (ref 11.5–15.5)
WBC: 16.3 10*3/uL — ABNORMAL HIGH (ref 4.0–10.5)
WBC: 12.3 10*3/uL — ABNORMAL HIGH (ref 4.0–10.5)
nRBC: 0 % (ref 0.0–0.2)
nRBC: 0 % (ref 0.0–0.2)

## 2019-07-23 LAB — POCT I-STAT 7, (LYTES, BLD GAS, ICA,H+H)
Acid-Base Excess: 1 mmol/L (ref 0.0–2.0)
Acid-Base Excess: 0 mmol/L (ref 0.0–2.0)
Acid-base deficit: 1 mmol/L (ref 0.0–2.0)
Acid-base deficit: 1 mmol/L (ref 0.0–2.0)
Acid-base deficit: 2 mmol/L (ref 0.0–2.0)
Acid-base deficit: 4 mmol/L — ABNORMAL HIGH (ref 0.0–2.0)
Acid-base deficit: 6 mmol/L — ABNORMAL HIGH (ref 0.0–2.0)
Acid-base deficit: 5 mmol/L — ABNORMAL HIGH (ref 0.0–2.0)
Acid-base deficit: 2 mmol/L (ref 0.0–2.0)
Acid-base deficit: 5 mmol/L — ABNORMAL HIGH (ref 0.0–2.0)
Bicarbonate: 26.4 mmol/L (ref 20.0–28.0)
Bicarbonate: 24.7 mmol/L (ref 20.0–28.0)
Bicarbonate: 25.3 mmol/L (ref 20.0–28.0)
Bicarbonate: 24.8 mmol/L (ref 20.0–28.0)
Bicarbonate: 23.9 mmol/L (ref 20.0–28.0)
Bicarbonate: 25.1 mmol/L (ref 20.0–28.0)
Bicarbonate: 25.2 mmol/L (ref 20.0–28.0)
Bicarbonate: 20.4 mmol/L (ref 20.0–28.0)
Bicarbonate: 22.9 mmol/L (ref 20.0–28.0)
Bicarbonate: 22.8 mmol/L (ref 20.0–28.0)
Calcium, Ion: 1.27 mmol/L (ref 1.15–1.40)
Calcium, Ion: 1.03 mmol/L — ABNORMAL LOW (ref 1.15–1.40)
Calcium, Ion: 1.11 mmol/L — ABNORMAL LOW (ref 1.15–1.40)
Calcium, Ion: 1.11 mmol/L — ABNORMAL LOW (ref 1.15–1.40)
Calcium, Ion: 1.16 mmol/L (ref 1.15–1.40)
Calcium, Ion: 1.2 mmol/L (ref 1.15–1.40)
Calcium, Ion: 1.22 mmol/L (ref 1.15–1.40)
Calcium, Ion: 1.13 mmol/L — ABNORMAL LOW (ref 1.15–1.40)
Calcium, Ion: 1.2 mmol/L (ref 1.15–1.40)
Calcium, Ion: 1.18 mmol/L (ref 1.15–1.40)
HCT: 37 % — ABNORMAL LOW (ref 39.0–52.0)
HCT: 31 % — ABNORMAL LOW (ref 39.0–52.0)
HCT: 30 % — ABNORMAL LOW (ref 39.0–52.0)
HCT: 29 % — ABNORMAL LOW (ref 39.0–52.0)
HCT: 31 % — ABNORMAL LOW (ref 39.0–52.0)
HCT: 40 % (ref 39.0–52.0)
HCT: 41 % (ref 39.0–52.0)
HCT: 38 % — ABNORMAL LOW (ref 39.0–52.0)
HCT: 35 % — ABNORMAL LOW (ref 39.0–52.0)
HCT: 37 % — ABNORMAL LOW (ref 39.0–52.0)
Hemoglobin: 12.6 g/dL — ABNORMAL LOW (ref 13.0–17.0)
Hemoglobin: 10.5 g/dL — ABNORMAL LOW (ref 13.0–17.0)
Hemoglobin: 10.2 g/dL — ABNORMAL LOW (ref 13.0–17.0)
Hemoglobin: 9.9 g/dL — ABNORMAL LOW (ref 13.0–17.0)
Hemoglobin: 10.5 g/dL — ABNORMAL LOW (ref 13.0–17.0)
Hemoglobin: 13.6 g/dL (ref 13.0–17.0)
Hemoglobin: 13.9 g/dL (ref 13.0–17.0)
Hemoglobin: 12.9 g/dL — ABNORMAL LOW (ref 13.0–17.0)
Hemoglobin: 11.9 g/dL — ABNORMAL LOW (ref 13.0–17.0)
Hemoglobin: 12.6 g/dL — ABNORMAL LOW (ref 13.0–17.0)
O2 Saturation: 100 %
O2 Saturation: 100 %
O2 Saturation: 100 %
O2 Saturation: 100 %
O2 Saturation: 94 %
O2 Saturation: 94 %
O2 Saturation: 95 %
O2 Saturation: 99 %
O2 Saturation: 99 %
O2 Saturation: 98 %
Patient temperature: 36.9
Patient temperature: 36.9
Patient temperature: 36.7
Patient temperature: 37
Patient temperature: 37.2
Potassium: 3.7 mmol/L (ref 3.5–5.1)
Potassium: 4.1 mmol/L (ref 3.5–5.1)
Potassium: 4.8 mmol/L (ref 3.5–5.1)
Potassium: 4.4 mmol/L (ref 3.5–5.1)
Potassium: 3.9 mmol/L (ref 3.5–5.1)
Potassium: 4.1 mmol/L (ref 3.5–5.1)
Potassium: 4.3 mmol/L (ref 3.5–5.1)
Potassium: 4.1 mmol/L (ref 3.5–5.1)
Potassium: 3.8 mmol/L (ref 3.5–5.1)
Potassium: 3.9 mmol/L (ref 3.5–5.1)
Sodium: 140 mmol/L (ref 135–145)
Sodium: 135 mmol/L (ref 135–145)
Sodium: 136 mmol/L (ref 135–145)
Sodium: 138 mmol/L (ref 135–145)
Sodium: 140 mmol/L (ref 135–145)
Sodium: 141 mmol/L (ref 135–145)
Sodium: 142 mmol/L (ref 135–145)
Sodium: 140 mmol/L (ref 135–145)
Sodium: 140 mmol/L (ref 135–145)
Sodium: 142 mmol/L (ref 135–145)
TCO2: 28 mmol/L (ref 22–32)
TCO2: 26 mmol/L (ref 22–32)
TCO2: 27 mmol/L (ref 22–32)
TCO2: 26 mmol/L (ref 22–32)
TCO2: 25 mmol/L (ref 22–32)
TCO2: 27 mmol/L (ref 22–32)
TCO2: 28 mmol/L (ref 22–32)
TCO2: 22 mmol/L (ref 22–32)
TCO2: 24 mmol/L (ref 22–32)
TCO2: 24 mmol/L (ref 22–32)
pCO2 arterial: 52.3 mmHg — ABNORMAL HIGH (ref 32.0–48.0)
pCO2 arterial: 47.1 mmHg (ref 32.0–48.0)
pCO2 arterial: 39.7 mmHg (ref 32.0–48.0)
pCO2 arterial: 39.5 mmHg (ref 32.0–48.0)
pCO2 arterial: 43.1 mmHg (ref 32.0–48.0)
pCO2 arterial: 64.1 mmHg — ABNORMAL HIGH (ref 32.0–48.0)
pCO2 arterial: 76.1 mmHg (ref 32.0–48.0)
pCO2 arterial: 36.6 mmHg (ref 32.0–48.0)
pCO2 arterial: 36.5 mmHg (ref 32.0–48.0)
pCO2 arterial: 50.9 mmHg — ABNORMAL HIGH (ref 32.0–48.0)
pH, Arterial: 7.31 — ABNORMAL LOW (ref 7.350–7.450)
pH, Arterial: 7.328 — ABNORMAL LOW (ref 7.350–7.450)
pH, Arterial: 7.412 (ref 7.350–7.450)
pH, Arterial: 7.405 (ref 7.350–7.450)
pH, Arterial: 7.352 (ref 7.350–7.450)
pH, Arterial: 7.128 — CL (ref 7.350–7.450)
pH, Arterial: 7.2 — ABNORMAL LOW (ref 7.350–7.450)
pH, Arterial: 7.353 (ref 7.350–7.450)
pH, Arterial: 7.405 (ref 7.350–7.450)
pH, Arterial: 7.261 — ABNORMAL LOW (ref 7.350–7.450)
pO2, Arterial: 539 mmHg — ABNORMAL HIGH (ref 83.0–108.0)
pO2, Arterial: 424 mmHg — ABNORMAL HIGH (ref 83.0–108.0)
pO2, Arterial: 355 mmHg — ABNORMAL HIGH (ref 83.0–108.0)
pO2, Arterial: 348 mmHg — ABNORMAL HIGH (ref 83.0–108.0)
pO2, Arterial: 74 mmHg — ABNORMAL LOW (ref 83.0–108.0)
pO2, Arterial: 93 mmHg (ref 83.0–108.0)
pO2, Arterial: 95 mmHg (ref 83.0–108.0)
pO2, Arterial: 159 mmHg — ABNORMAL HIGH (ref 83.0–108.0)
pO2, Arterial: 145 mmHg — ABNORMAL HIGH (ref 83.0–108.0)
pO2, Arterial: 116 mmHg — ABNORMAL HIGH (ref 83.0–108.0)

## 2019-07-23 LAB — POCT I-STAT, CHEM 8
BUN: 18 mg/dL (ref 8–23)
BUN: 15 mg/dL (ref 8–23)
BUN: 16 mg/dL (ref 8–23)
BUN: 15 mg/dL (ref 8–23)
BUN: 15 mg/dL (ref 8–23)
BUN: 15 mg/dL (ref 8–23)
Calcium, Ion: 1.33 mmol/L (ref 1.15–1.40)
Calcium, Ion: 1.12 mmol/L — ABNORMAL LOW (ref 1.15–1.40)
Calcium, Ion: 1.29 mmol/L (ref 1.15–1.40)
Calcium, Ion: 1.17 mmol/L (ref 1.15–1.40)
Calcium, Ion: 1.12 mmol/L — ABNORMAL LOW (ref 1.15–1.40)
Calcium, Ion: 1.18 mmol/L (ref 1.15–1.40)
Chloride: 104 mmol/L (ref 98–111)
Chloride: 102 mmol/L (ref 98–111)
Chloride: 103 mmol/L (ref 98–111)
Chloride: 103 mmol/L (ref 98–111)
Chloride: 103 mmol/L (ref 98–111)
Chloride: 104 mmol/L (ref 98–111)
Creatinine, Ser: 0.5 mg/dL — ABNORMAL LOW (ref 0.61–1.24)
Creatinine, Ser: 0.5 mg/dL — ABNORMAL LOW (ref 0.61–1.24)
Creatinine, Ser: 0.5 mg/dL — ABNORMAL LOW (ref 0.61–1.24)
Creatinine, Ser: 0.5 mg/dL — ABNORMAL LOW (ref 0.61–1.24)
Creatinine, Ser: 0.5 mg/dL — ABNORMAL LOW (ref 0.61–1.24)
Creatinine, Ser: 0.6 mg/dL — ABNORMAL LOW (ref 0.61–1.24)
Glucose, Bld: 98 mg/dL (ref 70–99)
Glucose, Bld: 106 mg/dL — ABNORMAL HIGH (ref 70–99)
Glucose, Bld: 108 mg/dL — ABNORMAL HIGH (ref 70–99)
Glucose, Bld: 127 mg/dL — ABNORMAL HIGH (ref 70–99)
Glucose, Bld: 133 mg/dL — ABNORMAL HIGH (ref 70–99)
Glucose, Bld: 115 mg/dL — ABNORMAL HIGH (ref 70–99)
HCT: 37 % — ABNORMAL LOW (ref 39.0–52.0)
HCT: 30 % — ABNORMAL LOW (ref 39.0–52.0)
HCT: 37 % — ABNORMAL LOW (ref 39.0–52.0)
HCT: 29 % — ABNORMAL LOW (ref 39.0–52.0)
HCT: 30 % — ABNORMAL LOW (ref 39.0–52.0)
HCT: 30 % — ABNORMAL LOW (ref 39.0–52.0)
Hemoglobin: 12.6 g/dL — ABNORMAL LOW (ref 13.0–17.0)
Hemoglobin: 10.2 g/dL — ABNORMAL LOW (ref 13.0–17.0)
Hemoglobin: 12.6 g/dL — ABNORMAL LOW (ref 13.0–17.0)
Hemoglobin: 9.9 g/dL — ABNORMAL LOW (ref 13.0–17.0)
Hemoglobin: 10.2 g/dL — ABNORMAL LOW (ref 13.0–17.0)
Hemoglobin: 10.2 g/dL — ABNORMAL LOW (ref 13.0–17.0)
Potassium: 3.8 mmol/L (ref 3.5–5.1)
Potassium: 4.1 mmol/L (ref 3.5–5.1)
Potassium: 4.3 mmol/L (ref 3.5–5.1)
Potassium: 4.5 mmol/L (ref 3.5–5.1)
Potassium: 4.3 mmol/L (ref 3.5–5.1)
Potassium: 4 mmol/L (ref 3.5–5.1)
Sodium: 140 mmol/L (ref 135–145)
Sodium: 138 mmol/L (ref 135–145)
Sodium: 139 mmol/L (ref 135–145)
Sodium: 138 mmol/L (ref 135–145)
Sodium: 138 mmol/L (ref 135–145)
Sodium: 140 mmol/L (ref 135–145)
TCO2: 27 mmol/L (ref 22–32)
TCO2: 25 mmol/L (ref 22–32)
TCO2: 26 mmol/L (ref 22–32)
TCO2: 27 mmol/L (ref 22–32)
TCO2: 26 mmol/L (ref 22–32)
TCO2: 25 mmol/L (ref 22–32)

## 2019-07-23 LAB — GLUCOSE, CAPILLARY
Glucose-Capillary: 104 mg/dL — ABNORMAL HIGH (ref 70–99)
Glucose-Capillary: 105 mg/dL — ABNORMAL HIGH (ref 70–99)
Glucose-Capillary: 138 mg/dL — ABNORMAL HIGH (ref 70–99)
Glucose-Capillary: 84 mg/dL (ref 70–99)
Glucose-Capillary: 96 mg/dL (ref 70–99)
Glucose-Capillary: 97 mg/dL (ref 70–99)
Glucose-Capillary: 97 mg/dL (ref 70–99)

## 2019-07-23 LAB — ECHO INTRAOPERATIVE TEE
Height: 69.5 in
Weight: 2528 oz

## 2019-07-23 LAB — HEMOGLOBIN AND HEMATOCRIT, BLOOD
HCT: 31.1 % — ABNORMAL LOW (ref 39.0–52.0)
Hemoglobin: 10.6 g/dL — ABNORMAL LOW (ref 13.0–17.0)

## 2019-07-23 LAB — PROTIME-INR
INR: 1.4 — ABNORMAL HIGH (ref 0.8–1.2)
Prothrombin Time: 16.4 seconds — ABNORMAL HIGH (ref 11.4–15.2)

## 2019-07-23 LAB — APTT: aPTT: 33 seconds (ref 24–36)

## 2019-07-23 LAB — PLATELET COUNT: Platelets: 94 10*3/uL — ABNORMAL LOW (ref 150–400)

## 2019-07-23 SURGERY — REPLACEMENT, AORTIC VALVE, MINIMALLY INVASIVE
Anesthesia: General | Site: Chest

## 2019-07-23 MED ORDER — SODIUM BICARBONATE 8.4 % IV SOLN
50.0000 meq | Freq: Once | INTRAVENOUS | Status: AC
  Administered 2019-07-23: 50 meq via INTRAVENOUS

## 2019-07-23 MED ORDER — ASPIRIN 81 MG PO CHEW: 324.0000 mg | CHEWABLE_TABLET | Freq: Every day | ORAL | Status: DC

## 2019-07-23 MED ORDER — CHLORHEXIDINE GLUCONATE 4 % EX LIQD: 30.0000 mL | CUTANEOUS | Status: DC

## 2019-07-23 MED ORDER — CHLORHEXIDINE GLUCONATE 0.12 % MT SOLN
15.0000 mL | OROMUCOSAL | Status: AC
  Administered 2019-07-23: 15 mL via OROMUCOSAL

## 2019-07-23 MED ORDER — METOPROLOL TARTRATE 5 MG/5ML IV SOLN
2.5000 mg | INTRAVENOUS | Status: DC | PRN
  Administered 2019-07-26 – 2019-07-28 (×3): 5 mg via INTRAVENOUS
  Filled 2019-07-23 (×3): qty 5

## 2019-07-23 MED ORDER — CHLORHEXIDINE GLUCONATE 0.12 % MT SOLN: 15.0000 mL | Freq: Once | OROMUCOSAL | Status: AC

## 2019-07-23 MED ORDER — BISACODYL 5 MG PO TBEC
10.0000 mg | DELAYED_RELEASE_TABLET | Freq: Every day | ORAL | Status: DC
  Administered 2019-07-25 – 2019-07-31 (×7): 10 mg via ORAL
  Filled 2019-07-23 (×9): qty 2

## 2019-07-23 MED ORDER — MORPHINE SULFATE (PF) 2 MG/ML IV SOLN
1.0000 mg | INTRAVENOUS | Status: DC | PRN
  Administered 2019-07-25 (×3): 2 mg via INTRAVENOUS
  Filled 2019-07-23 (×3): qty 1

## 2019-07-23 MED ORDER — ASPIRIN EC 325 MG PO TBEC
325.0000 mg | DELAYED_RELEASE_TABLET | Freq: Every day | ORAL | Status: DC
  Administered 2019-07-25 – 2019-07-26 (×2): 325 mg via ORAL
  Filled 2019-07-23 (×3): qty 1

## 2019-07-23 MED ORDER — PHENYLEPHRINE HCL-NACL 20-0.9 MG/250ML-% IV SOLN
0.0000 ug/min | INTRAVENOUS | Status: DC
  Administered 2019-07-23: 15:00:00 40 ug/min via INTRAVENOUS

## 2019-07-23 MED ORDER — FENTANYL CITRATE (PF) 250 MCG/5ML IJ SOLN
INTRAMUSCULAR | Status: AC
  Filled 2019-07-23: qty 20

## 2019-07-23 MED ORDER — FAMOTIDINE IN NACL 20-0.9 MG/50ML-% IV SOLN
20.0000 mg | Freq: Two times a day (BID) | INTRAVENOUS | Status: AC
  Administered 2019-07-23 (×2): 20 mg via INTRAVENOUS
  Filled 2019-07-23 (×2): qty 50

## 2019-07-23 MED ORDER — LACTATED RINGERS IV SOLN: INTRAVENOUS | Status: DC

## 2019-07-23 MED ORDER — CHLORHEXIDINE GLUCONATE 0.12 % MT SOLN
OROMUCOSAL | Status: AC
  Administered 2019-07-23: 06:00:00 15 mL via OROMUCOSAL
  Filled 2019-07-23: qty 15

## 2019-07-23 MED ORDER — BISACODYL 10 MG RE SUPP: 10.0000 mg | Freq: Every day | RECTAL | Status: DC

## 2019-07-23 MED ORDER — PLASMA-LYTE 148 IV SOLN
INTRAVENOUS | Status: DC | PRN
  Administered 2019-07-23: 10:00:00 500 mL

## 2019-07-23 MED ORDER — PROTAMINE SULFATE 10 MG/ML IV SOLN
INTRAVENOUS | Status: DC | PRN
  Administered 2019-07-23: 250 mg via INTRAVENOUS

## 2019-07-23 MED ORDER — SODIUM CHLORIDE 0.9 % IV SOLN
250.0000 mL | INTRAVENOUS | Status: DC
  Administered 2019-07-29: 250 mL via INTRAVENOUS

## 2019-07-23 MED ORDER — DOCUSATE SODIUM 100 MG PO CAPS
200.0000 mg | ORAL_CAPSULE | Freq: Every day | ORAL | Status: DC
  Administered 2019-07-25 – 2019-07-29 (×5): 200 mg via ORAL
  Filled 2019-07-23 (×6): qty 2

## 2019-07-23 MED ORDER — PROPOFOL 10 MG/ML IV BOLUS
INTRAVENOUS | Status: AC
  Filled 2019-07-23: qty 20

## 2019-07-23 MED ORDER — EPHEDRINE SULFATE-NACL 50-0.9 MG/10ML-% IV SOSY
PREFILLED_SYRINGE | INTRAVENOUS | Status: DC | PRN
  Administered 2019-07-23: 5 mg via INTRAVENOUS
  Administered 2019-07-23: 10 mg via INTRAVENOUS
  Administered 2019-07-23: 5 mg via INTRAVENOUS

## 2019-07-23 MED ORDER — SUGAMMADEX SODIUM 200 MG/2ML IV SOLN
INTRAVENOUS | Status: DC | PRN
  Administered 2019-07-23: 200 mg via INTRAVENOUS

## 2019-07-23 MED ORDER — OXYCODONE HCL 5 MG PO TABS
5.0000 mg | ORAL_TABLET | ORAL | Status: DC | PRN
  Administered 2019-07-24: 04:00:00 10 mg via ORAL
  Administered 2019-07-29: 5 mg via ORAL
  Administered 2019-07-30: 01:00:00 10 mg via ORAL
  Filled 2019-07-23: qty 2
  Filled 2019-07-23: qty 1
  Filled 2019-07-23: qty 2
  Filled 2019-07-23: qty 1

## 2019-07-23 MED ORDER — SODIUM CHLORIDE 0.9 % IV SOLN: INTRAVENOUS | Status: DC

## 2019-07-23 MED ORDER — VANCOMYCIN HCL IN DEXTROSE 1-5 GM/200ML-% IV SOLN
1000.0000 mg | Freq: Once | INTRAVENOUS | Status: AC
  Administered 2019-07-23: 1000 mg via INTRAVENOUS
  Filled 2019-07-23: qty 200

## 2019-07-23 MED ORDER — FENTANYL CITRATE (PF) 250 MCG/5ML IJ SOLN
INTRAMUSCULAR | Status: DC | PRN
  Administered 2019-07-23 (×5): 100 ug via INTRAVENOUS
  Administered 2019-07-23: 150 ug via INTRAVENOUS
  Administered 2019-07-23: 100 ug via INTRAVENOUS
  Administered 2019-07-23 (×3): 50 ug via INTRAVENOUS
  Administered 2019-07-23: 100 ug via INTRAVENOUS

## 2019-07-23 MED ORDER — INSULIN REGULAR(HUMAN) IN NACL 100-0.9 UT/100ML-% IV SOLN
INTRAVENOUS | Status: DC
  Administered 2019-07-23: 0.5 [IU]/h via INTRAVENOUS

## 2019-07-23 MED ORDER — SODIUM CHLORIDE 0.9 % IV SOLN
INTRAVENOUS | Status: DC | PRN
Start: 2019-07-23 — End: 2019-07-23

## 2019-07-23 MED ORDER — ACETAMINOPHEN 650 MG RE SUPP
650.0000 mg | Freq: Once | RECTAL | Status: AC
  Administered 2019-07-23: 16:00:00 650 mg via RECTAL

## 2019-07-23 MED ORDER — LACTATED RINGERS IV SOLN: INTRAVENOUS | Status: DC | PRN

## 2019-07-23 MED ORDER — 0.9 % SODIUM CHLORIDE (POUR BTL) OPTIME
TOPICAL | Status: DC | PRN
  Administered 2019-07-23: 10:00:00 5000 mL

## 2019-07-23 MED ORDER — PROPOFOL 10 MG/ML IV BOLUS
INTRAVENOUS | Status: DC | PRN
  Administered 2019-07-23: 50 mg via INTRAVENOUS

## 2019-07-23 MED ORDER — LIDOCAINE 2% (20 MG/ML) 5 ML SYRINGE
INTRAMUSCULAR | Status: DC | PRN
  Administered 2019-07-23: 100 mg via INTRAVENOUS

## 2019-07-23 MED ORDER — ONDANSETRON HCL 4 MG/2ML IJ SOLN
INTRAMUSCULAR | Status: AC
  Filled 2019-07-23: qty 2

## 2019-07-23 MED ORDER — HEPARIN SODIUM (PORCINE) 1000 UNIT/ML IJ SOLN
INTRAMUSCULAR | Status: DC | PRN
  Administered 2019-07-23: 25000 [IU] via INTRAVENOUS

## 2019-07-23 MED ORDER — METOPROLOL TARTRATE 12.5 MG HALF TABLET
ORAL_TABLET | ORAL | Status: AC
  Administered 2019-07-23: 06:00:00 12.5 mg via ORAL
  Filled 2019-07-23: qty 1

## 2019-07-23 MED ORDER — CHLORHEXIDINE GLUCONATE CLOTH 2 % EX PADS
6.0000 | MEDICATED_PAD | Freq: Every day | CUTANEOUS | Status: DC
  Administered 2019-07-25 – 2019-07-29 (×5): 6 via TOPICAL

## 2019-07-23 MED ORDER — ONDANSETRON HCL 4 MG/2ML IJ SOLN
4.0000 mg | Freq: Four times a day (QID) | INTRAMUSCULAR | Status: DC | PRN
  Administered 2019-07-24 – 2019-07-29 (×2): 4 mg via INTRAVENOUS
  Filled 2019-07-23 (×2): qty 2

## 2019-07-23 MED ORDER — MIDAZOLAM HCL 5 MG/5ML IJ SOLN
INTRAMUSCULAR | Status: DC | PRN
  Administered 2019-07-23 (×4): 2 mg via INTRAVENOUS

## 2019-07-23 MED ORDER — BUPIVACAINE LIPOSOME 1.3 % IJ SUSP
INTRAMUSCULAR | Status: DC | PRN
  Administered 2019-07-23: 50 mL

## 2019-07-23 MED ORDER — LACTATED RINGERS IV SOLN: 500.0000 mL | Freq: Once | INTRAVENOUS | Status: DC | PRN

## 2019-07-23 MED ORDER — ONDANSETRON HCL 4 MG/2ML IJ SOLN
INTRAMUSCULAR | Status: DC | PRN
  Administered 2019-07-23: 4 mg via INTRAVENOUS

## 2019-07-23 MED ORDER — METOPROLOL TARTRATE 12.5 MG HALF TABLET: 12.5000 mg | ORAL_TABLET | Freq: Once | ORAL | Status: AC

## 2019-07-23 MED ORDER — POTASSIUM CHLORIDE 10 MEQ/50ML IV SOLN: 10.0000 meq | INTRAVENOUS | Status: AC

## 2019-07-23 MED ORDER — MAGNESIUM SULFATE 4 GM/100ML IV SOLN
4.0000 g | Freq: Once | INTRAVENOUS | Status: AC
  Administered 2019-07-23: 4 g via INTRAVENOUS
  Filled 2019-07-23: qty 100

## 2019-07-23 MED ORDER — BUPIVACAINE HCL (PF) 0.5 % IJ SOLN
INTRAMUSCULAR | Status: AC
  Filled 2019-07-23: qty 30

## 2019-07-23 MED ORDER — ALBUMIN HUMAN 5 % IV SOLN
250.0000 mL | INTRAVENOUS | Status: AC | PRN
  Administered 2019-07-23: 12.5 g via INTRAVENOUS
  Filled 2019-07-23: qty 250

## 2019-07-23 MED ORDER — SODIUM CHLORIDE 0.9% FLUSH: 3.0000 mL | INTRAVENOUS | Status: DC | PRN

## 2019-07-23 MED ORDER — ORAL CARE MOUTH RINSE
15.0000 mL | OROMUCOSAL | Status: DC
  Administered 2019-07-23 – 2019-07-24 (×8): 15 mL via OROMUCOSAL

## 2019-07-23 MED ORDER — ACETAMINOPHEN 500 MG PO TABS
1000.0000 mg | ORAL_TABLET | Freq: Four times a day (QID) | ORAL | Status: AC
  Administered 2019-07-23 – 2019-07-28 (×16): 1000 mg via ORAL
  Filled 2019-07-23 (×14): qty 2

## 2019-07-23 MED ORDER — NITROGLYCERIN IN D5W 200-5 MCG/ML-% IV SOLN: 0.0000 ug/min | INTRAVENOUS | Status: DC

## 2019-07-23 MED ORDER — LACTATED RINGERS IV SOLN
INTRAVENOUS | Status: DC | PRN
Start: 2019-07-23 — End: 2019-07-23

## 2019-07-23 MED ORDER — DEXMEDETOMIDINE HCL IN NACL 400 MCG/100ML IV SOLN
0.0000 ug/kg/h | INTRAVENOUS | Status: DC
  Administered 2019-07-23: 0.1 ug/kg/h via INTRAVENOUS

## 2019-07-23 MED ORDER — SODIUM CHLORIDE 0.9 % IV SOLN
1.5000 g | Freq: Two times a day (BID) | INTRAVENOUS | Status: AC
  Administered 2019-07-23 – 2019-07-25 (×4): 1.5 g via INTRAVENOUS
  Filled 2019-07-23 (×4): qty 1.5

## 2019-07-23 MED ORDER — DEXTROSE 50 % IV SOLN: 0.0000 mL | INTRAVENOUS | Status: DC | PRN

## 2019-07-23 MED ORDER — TRAMADOL HCL 50 MG PO TABS
50.0000 mg | ORAL_TABLET | ORAL | Status: DC | PRN
  Administered 2019-07-24 – 2019-08-01 (×10): 100 mg via ORAL
  Filled 2019-07-23 (×11): qty 2

## 2019-07-23 MED ORDER — EPHEDRINE 5 MG/ML INJ
INTRAVENOUS | Status: AC
  Filled 2019-07-23: qty 10

## 2019-07-23 MED ORDER — MIDAZOLAM HCL 2 MG/2ML IJ SOLN: 2.0000 mg | INTRAMUSCULAR | Status: DC | PRN

## 2019-07-23 MED ORDER — SODIUM CHLORIDE 0.45 % IV SOLN: INTRAVENOUS | Status: DC | PRN

## 2019-07-23 MED ORDER — PANTOPRAZOLE SODIUM 40 MG PO TBEC
40.0000 mg | DELAYED_RELEASE_TABLET | Freq: Every day | ORAL | Status: DC
  Administered 2019-07-25 – 2019-08-01 (×8): 40 mg via ORAL
  Filled 2019-07-23 (×8): qty 1

## 2019-07-23 MED ORDER — ACETAMINOPHEN 160 MG/5ML PO SOLN: 650.0000 mg | Freq: Once | ORAL | Status: AC

## 2019-07-23 MED ORDER — VANCOMYCIN HCL 1000 MG IV SOLR
INTRAVENOUS | Status: DC | PRN
  Administered 2019-07-23: 1000 mL

## 2019-07-23 MED ORDER — LIDOCAINE 2% (20 MG/ML) 5 ML SYRINGE
INTRAMUSCULAR | Status: AC
  Filled 2019-07-23: qty 5

## 2019-07-23 MED ORDER — ROCURONIUM BROMIDE 10 MG/ML (PF) SYRINGE
PREFILLED_SYRINGE | INTRAVENOUS | Status: DC | PRN
  Administered 2019-07-23: 50 mg via INTRAVENOUS
  Administered 2019-07-23: 100 mg via INTRAVENOUS
  Administered 2019-07-23: 50 mg via INTRAVENOUS

## 2019-07-23 MED ORDER — FENTANYL CITRATE (PF) 250 MCG/5ML IJ SOLN
INTRAMUSCULAR | Status: AC
  Filled 2019-07-23: qty 5

## 2019-07-23 MED ORDER — ACETAMINOPHEN 160 MG/5ML PO SOLN: 1000.0000 mg | Freq: Four times a day (QID) | ORAL | Status: DC

## 2019-07-23 MED ORDER — ROCURONIUM BROMIDE 10 MG/ML (PF) SYRINGE
PREFILLED_SYRINGE | INTRAVENOUS | Status: AC
  Filled 2019-07-23: qty 10

## 2019-07-23 MED ORDER — PHENYLEPHRINE 40 MCG/ML (10ML) SYRINGE FOR IV PUSH (FOR BLOOD PRESSURE SUPPORT)
PREFILLED_SYRINGE | INTRAVENOUS | Status: DC | PRN
  Administered 2019-07-23 (×5): 80 ug via INTRAVENOUS

## 2019-07-23 MED ORDER — CHLORHEXIDINE GLUCONATE 0.12% ORAL RINSE (MEDLINE KIT)
15.0000 mL | Freq: Two times a day (BID) | OROMUCOSAL | Status: DC
  Administered 2019-07-23 – 2019-07-27 (×3): 15 mL via OROMUCOSAL

## 2019-07-23 MED ORDER — PROPOFOL 10 MG/ML IV BOLUS
INTRAVENOUS | Status: DC | PRN
  Administered 2019-07-23: 120 mg via INTRAVENOUS

## 2019-07-23 MED ORDER — MIDAZOLAM HCL (PF) 10 MG/2ML IJ SOLN
INTRAMUSCULAR | Status: AC
  Filled 2019-07-23: qty 2

## 2019-07-23 MED ORDER — SODIUM CHLORIDE 0.9% FLUSH
3.0000 mL | Freq: Two times a day (BID) | INTRAVENOUS | Status: DC
  Administered 2019-07-24 – 2019-07-30 (×8): 3 mL via INTRAVENOUS

## 2019-07-23 SURGICAL SUPPLY — 116 items
ADAPTER CARDIO PERF ANTE/RETRO (ADAPTER) ×3 IMPLANT
BAG DECANTER FOR FLEXI CONT (MISCELLANEOUS) ×6 IMPLANT
BLADE CLIPPER SURG (BLADE) ×3 IMPLANT
BLADE STERNUM SYSTEM 6 (BLADE) ×3 IMPLANT
BLADE SURG 11 STRL SS (BLADE) ×3 IMPLANT
CANISTER SUCT 3000ML PPV (MISCELLANEOUS) ×3 IMPLANT
CANNULA FEM VENOUS REMOTE 22FR (CANNULA) ×3 IMPLANT
CANNULA FEMORAL ART 14 SM (MISCELLANEOUS) ×3 IMPLANT
CANNULA GUNDRY RCSP 15FR (MISCELLANEOUS) ×3 IMPLANT
CANNULA OPTISITE PERFUSION 16F (CANNULA) IMPLANT
CANNULA OPTISITE PERFUSION 18F (CANNULA) ×3 IMPLANT
CANNULA SUMP PERICARDIAL (CANNULA) ×3 IMPLANT
CATH CPB KIT OWEN (MISCELLANEOUS) IMPLANT
CATH ENDOVENT PULMONARY (CATHETERS) IMPLANT
CATH HEART VENT LEFT (CATHETERS) ×2 IMPLANT
CELLS DAT CNTRL 66122 CELL SVR (MISCELLANEOUS) ×2 IMPLANT
CNTNR URN SCR LID CUP LEK RST (MISCELLANEOUS) ×2 IMPLANT
CONN ST 1/4X3/8  BEN (MISCELLANEOUS) ×2
CONN ST 1/4X3/8 BEN (MISCELLANEOUS) ×4 IMPLANT
CONNECTOR 1/2X3/8X1/2 3 WAY (MISCELLANEOUS) ×1
CONNECTOR 1/2X3/8X1/2 3WAY (MISCELLANEOUS) ×2 IMPLANT
CONT SPEC 4OZ STRL OR WHT (MISCELLANEOUS) ×1
COVER BACK TABLE 24X17X13 BIG (DRAPES) ×3 IMPLANT
COVER PROBE W GEL 5X96 (DRAPES) ×3 IMPLANT
DERMABOND ADVANCED (GAUZE/BANDAGES/DRESSINGS) ×2
DERMABOND ADVANCED .7 DNX12 (GAUZE/BANDAGES/DRESSINGS) ×4 IMPLANT
DEVICE CLOSURE PERCLS PRGLD 6F (VASCULAR PRODUCTS) ×6 IMPLANT
DEVICE SUT CK QUICK LOAD MINI (Prosthesis & Implant Heart) ×3 IMPLANT
DEVICE TROCAR PUNCTURE CLOSURE (ENDOMECHANICALS) ×3 IMPLANT
DRAIN CHANNEL 32F RND 10.7 FF (WOUND CARE) ×6 IMPLANT
DRAPE CV SPLIT W-CLR ANES SCRN (DRAPES) ×3 IMPLANT
DRAPE INCISE IOBAN 66X45 STRL (DRAPES) ×3 IMPLANT
DRAPE PERI GROIN 82X75IN TIB (DRAPES) ×3 IMPLANT
DRAPE SLUSH/WARMER DISC (DRAPES) ×3 IMPLANT
DRSG AQUACEL AG ADV 3.5X 4 (GAUZE/BANDAGES/DRESSINGS) ×3 IMPLANT
DRSG AQUACEL AG ADV 3.5X 6 (GAUZE/BANDAGES/DRESSINGS) ×3 IMPLANT
ELECT BLADE 4.0 EZ CLEAN MEGAD (MISCELLANEOUS)
ELECT BLADE 6.5 EXT (BLADE) ×3 IMPLANT
ELECT REM PT RETURN 9FT ADLT (ELECTROSURGICAL) ×6
ELECTRODE BLDE 4.0 EZ CLN MEGD (MISCELLANEOUS) IMPLANT
ELECTRODE REM PT RTRN 9FT ADLT (ELECTROSURGICAL) ×4 IMPLANT
FELT TEFLON 1X6 (MISCELLANEOUS) ×3 IMPLANT
FEMORAL VENOUS CANN RAP (CANNULA) IMPLANT
FIBERTAPE STERNAL CLSR 2 36IN (SUTURE) ×3 IMPLANT
FIBERTAPE STERNAL CLSR 2X36 (SUTURE) ×3 IMPLANT
GAUZE SPONGE 4X4 12PLY STRL (GAUZE/BANDAGES/DRESSINGS) ×3 IMPLANT
GAUZE SPONGE 4X4 16PLY XRAY LF (GAUZE/BANDAGES/DRESSINGS) ×3 IMPLANT
GLOVE BIO SURGEON STRL SZ 6.5 (GLOVE) ×12 IMPLANT
GLOVE BIOGEL PI IND STRL 6.5 (GLOVE) ×4 IMPLANT
GLOVE BIOGEL PI IND STRL 7.0 (GLOVE) ×2 IMPLANT
GLOVE BIOGEL PI IND STRL 7.5 (GLOVE) ×4 IMPLANT
GLOVE BIOGEL PI INDICATOR 6.5 (GLOVE) ×2
GLOVE BIOGEL PI INDICATOR 7.0 (GLOVE) ×1
GLOVE BIOGEL PI INDICATOR 7.5 (GLOVE) ×2
GLOVE ORTHO TXT STRL SZ7.5 (GLOVE) ×6 IMPLANT
GLOVE SURG SS PI 6.5 STRL IVOR (GLOVE) ×3 IMPLANT
GLOVE SURG SS PI 7.5 STRL IVOR (GLOVE) ×3 IMPLANT
GOWN STRL REUS W/ TWL LRG LVL3 (GOWN DISPOSABLE) ×18 IMPLANT
GOWN STRL REUS W/TWL LRG LVL3 (GOWN DISPOSABLE) ×9
GRASPER SUT TROCAR 14GX15 (MISCELLANEOUS) ×3 IMPLANT
KIT BASIN OR (CUSTOM PROCEDURE TRAY) ×3 IMPLANT
KIT CATH SUCT 8FR (CATHETERS) IMPLANT
KIT DILATOR VASC 18G NDL (KITS) ×3 IMPLANT
KIT DRAINAGE VACCUM ASSIST (KITS) ×3 IMPLANT
KIT SUCTION CATH 14FR (SUCTIONS) ×3 IMPLANT
KIT SUT CK MINI COMBO 4X17 (Prosthesis & Implant Heart) ×3 IMPLANT
KIT TURNOVER KIT B (KITS) ×3 IMPLANT
LEAD PACING MYOCARDI (MISCELLANEOUS) ×6 IMPLANT
LINE VENT (MISCELLANEOUS) ×3 IMPLANT
MARKER SKIN DUAL TIP RULER LAB (MISCELLANEOUS) ×3 IMPLANT
NEEDLE AORTIC ROOT 14G 7F (CATHETERS) ×3 IMPLANT
NS IRRIG 1000ML POUR BTL (IV SOLUTION) ×15 IMPLANT
PACK OPEN HEART (CUSTOM PROCEDURE TRAY) ×3 IMPLANT
PAD ARMBOARD 7.5X6 YLW CONV (MISCELLANEOUS) ×6 IMPLANT
PAD ELECT DEFIB RADIOL ZOLL (MISCELLANEOUS) ×3 IMPLANT
PERCLOSE PROGLIDE 6F (VASCULAR PRODUCTS) ×9
POSITIONER HEAD DONUT 9IN (MISCELLANEOUS) ×3 IMPLANT
RTRCTR WOUND ALEXIS 18CM MED (MISCELLANEOUS) ×3
SET CANNULATION TOURNIQUET (MISCELLANEOUS) ×3 IMPLANT
SET CARDIOPLEGIA MPS 5001102 (MISCELLANEOUS) ×3 IMPLANT
SET IRRIG TUBING LAPAROSCOPIC (IRRIGATION / IRRIGATOR) ×3 IMPLANT
SET MICROPUNCTURE 5F STIFF (MISCELLANEOUS) ×3 IMPLANT
SHEATH PINNACLE 8F 10CM (SHEATH) ×9 IMPLANT
SOL ANTI FOG 6CC (MISCELLANEOUS) ×2 IMPLANT
SOLUTION ANTI FOG 6CC (MISCELLANEOUS) ×1
SUT BONE WAX W31G (SUTURE) ×3 IMPLANT
SUT ETHIBON 2 0 V 52N 30 (SUTURE) ×6 IMPLANT
SUT ETHIBON EXCEL 2-0 V-5 (SUTURE) ×6 IMPLANT
SUT ETHIBOND V-5 VALVE (SUTURE) ×6 IMPLANT
SUT ETHIBOND X763 2 0 SH 1 (SUTURE) ×6 IMPLANT
SUT GORETEX CV 4 TH 22 36 (SUTURE) IMPLANT
SUT GORETEX CV4 TH-18 (SUTURE) IMPLANT
SUT MNCRL AB 3-0 PS2 18 (SUTURE) ×3 IMPLANT
SUT PROLENE 3 0 SH1 36 (SUTURE) ×21 IMPLANT
SUT PROLENE 4 0 RB 1 (SUTURE) ×12
SUT PROLENE 4-0 RB1 .5 CRCL 36 (SUTURE) ×24 IMPLANT
SUT PROLENE 6 0 C 1 30 (SUTURE) ×3 IMPLANT
SUT SILK  1 MH (SUTURE) ×2
SUT SILK 1 MH (SUTURE) ×4 IMPLANT
SUT TEM PAC WIRE 2 0 SH (SUTURE) IMPLANT
SYR 10ML LL (SYRINGE) ×3 IMPLANT
SYSTEM SAHARA CHEST DRAIN ATS (WOUND CARE) ×3 IMPLANT
TAPE CLOTH SURG 4X10 WHT LF (GAUZE/BANDAGES/DRESSINGS) ×3 IMPLANT
TAPE PAPER 2X10 WHT MICROPORE (GAUZE/BANDAGES/DRESSINGS) ×3 IMPLANT
TOWEL GREEN STERILE (TOWEL DISPOSABLE) ×6 IMPLANT
TOWEL GREEN STERILE FF (TOWEL DISPOSABLE) ×3 IMPLANT
TRAY FOLEY SLVR 16FR TEMP STAT (SET/KITS/TRAYS/PACK) ×3 IMPLANT
TROCAR XCEL BLADELESS 5X75MML (TROCAR) IMPLANT
TROCAR XCEL NON-BLD 11X100MML (ENDOMECHANICALS) ×9 IMPLANT
TUBE CONNECTING 20X1/4 (TUBING) ×3 IMPLANT
TUBE SUCT INTRACARD DLP 20F (MISCELLANEOUS) ×3 IMPLANT
UNDERPAD 30X30 (UNDERPADS AND DIAPERS) ×3 IMPLANT
VALVE AORTIC SZ21 INSP/RESIL (Valve) ×3 IMPLANT
VENT LEFT HEART 12002 (CATHETERS) ×3
WATER STERILE IRR 1000ML POUR (IV SOLUTION) ×6 IMPLANT
WIRE EMERALD 3MM-J .035X150CM (WIRE) ×3 IMPLANT

## 2019-07-23 NOTE — Anesthesia Procedure Notes (Signed)
Central Venous Catheter Insertion Performed by: Duane Boston, MD, anesthesiologist Start/End4/30/2021 6:47 AM, 07/23/2019 7:58 AM Patient location: Pre-op. Preanesthetic checklist: patient identified, IV checked, site marked, risks and benefits discussed, surgical consent, monitors and equipment checked, pre-op evaluation, timeout performed and anesthesia consent Position: Trendelenburg Lidocaine 1% used for infiltration and patient sedated Hand hygiene performed , maximum sterile barriers used  and Seldinger technique used Catheter size: 8.5 Fr Total catheter length 8. PA cath was placed.Sheath introducer Swan type:thermodilution PA Cath depth:50 Procedure performed using ultrasound guided technique. Ultrasound Notes:anatomy identified, needle tip was noted to be adjacent to the nerve/plexus identified, no ultrasound evidence of intravascular and/or intraneural injection and image(s) printed for medical record Attempts: 1 Following insertion, line sutured and dressing applied. Post procedure assessment: free fluid flow, blood return through all ports and no air  Patient tolerated the procedure well with no immediate complications.

## 2019-07-23 NOTE — Anesthesia Procedure Notes (Signed)
Procedure Name: Intubation Date/Time: 07/23/2019 7:58 AM Performed by: Amadeo Garnet, CRNA Pre-anesthesia Checklist: Emergency Drugs available, Patient identified, Suction available and Patient being monitored Patient Re-evaluated:Patient Re-evaluated prior to induction Oxygen Delivery Method: Circle system utilized Preoxygenation: Pre-oxygenation with 100% oxygen Induction Type: IV induction Ventilation: Mask ventilation without difficulty Laryngoscope Size: Mac and 4 Grade View: Grade I Tube type: Oral Endobronchial tube: Left, Double lumen EBT and EBT position confirmed by fiberoptic bronchoscope and 37 Fr Number of attempts: 1 Airway Equipment and Method: Stylet and Fiberoptic brochoscope Placement Confirmation: ETT inserted through vocal cords under direct vision and breath sounds checked- equal and bilateral Tube secured with: Tape Dental Injury: Teeth and Oropharynx as per pre-operative assessment

## 2019-07-23 NOTE — Op Note (Addendum)
CARDIOTHORACIC SURGERY OPERATIVE NOTE  Date of Procedure:  07/23/2019  Preoperative Diagnosis: Severe Aortic Stenosis   Postoperative Diagnosis: Same   Procedure:    Minimally Invasive Aortic Valve Replacement Right Anterior Mini-thoracotomy Edwards Inspiris Resilia Stented Bovine Pericardial Tissue Valve (size 48mm, ref #[id number], serial [id number])   Surgeon: Valentina Gu. Roxy Manns, MD  Assistant: Enid Cutter, PA-C  Anesthesia: Duane Boston, MD  Operative Findings:  [place] type 0 bicuspid aortic valve  Severe aortic stenosis  Normal LV systolic function  Moderate-severe LV hypertrophy           BRIEF CLINICAL NOTE AND INDICATIONS FOR SURGERY  Patient is a 69 year old previously healthy male who has been referred for surgical consultation to discuss treatment options for management of severe symptomatic aortic stenosis.  Patient states that he was first noted to have a murmur on physical exam more than 10 years ago. He denies any known history of rheumatic fever during childhood. He was diagnosed with aortic stenosis and has been followed by his primary care physician with intermittent echocardiograms. Transthoracic echocardiogram performed in 2018 reportedly revealed normal left ventricular systolic function with ejection fraction estimated 65 to 70% and moderate aortic stenosis with peak and mean transvalvular gradient estimated 41 and 23 mmHg respectively, aortic valve area calculated 1.0 cm, and DVI reported 0.37. Follow-up echocardiogram performed in 2020 revealed significant progression of disease with peak and mean transvalvular gradients estimated 71 and 43 mmHg respectively and DVI reported only 0.24. Up until recently the patient has remained asymptomatic. However, the patient recently developed a near syncopal episode while exercising. He was initially evaluated at [facility] where he was told he would likely need surgery within the next 6 months  to 2 years. He was referred to Dr. Burt Knack for a second opinion and underwent diagnostic cardiac catheterization on June 08, 2019 which revealed mild nonobstructive coronary artery disease. Transvalvular gradient across aortic valve not assessed. Right heart pressures were normal. Treatment options were discussed and the patient underwent CT angiography to evaluate the feasibility of transcatheter aortic valve replacement. The patient was referred for surgical consultation.  The patient has been seen in consultation and counseled at length regarding the indications, risks and potential benefits of surgery.  All questions have been answered, and the patient provides full informed consent for the operation as described.    DETAILS OF THE OPERATIVE PROCEDURE  Preparation:  The patient is brought to the operating room on the above mentioned date and central monitoring was established by the anesthesia team including placement of Swan-Ganz catheter through the left internal jugular vein.  A radial arterial line is placed. The patient is placed in the supine position on the operating table.  Intravenous antibiotics are administered. General endotracheal anesthesia is induced uneventfully. The patient is initially intubated using a dual lumen endotracheal tube.  A Foley catheter is placed.  Baseline transesophageal echocardiogram was performed.  Findings were notable for bicuspid aortic valve with severe aortic stenosis.  There was normal left ventricular systolic function with moderate to severe left ventricular hypertrophy.  There was mild central mitral regurgitation.  There was mild aortic insufficiency.  The patient is placed in the supine position with their neck gently extended and turned to the left.   The patient's right neck, chest, abdomen, both groins, and both lower extremities are prepared and draped in a sterile manner. A time out procedure is performed.   Percutaneous Vascular  Access:  Percutaneous arterial and venous access were obtained on the  right side.  Using ultrasound guidance the right common femoral vein was cannulated using the Seldinger technique and a pair of Perclose vascular closure devises were placed at opposing 30 degree angles, after which time an 8 French sheath inserted.  The right common femoral artery was cannulated using a micropuncture wire and sheath.  A pair of Perclose vascular closure devices were placed at opposing 30 degree angles in the femoral artery, and a 8 French sheath inserted.  The right internal jugular vein was cannulated  using ultrasound guidance and an 8 French sheath inserted.     Surgical Approach:  A right miniature anteriorthoracotomy incision is performed. The incision is placed immediately over the 3rd intercostal space.  The muscle fibers of the pectoralis major muscle are split longitudinally and the right pleural space is entered through the 3rd intercostal space. The 3rd costal cartilage is divided at it's insertion onto the sternum.  The right internal mammary artery and veins are ligated and divided.  A soft tissue retractor is placed.  Two 11 mm ports are placed through separate stab incisions inferiorly. The right pleural space is insufflated continuously with carbon dioxide gas through the posterior port during the remainder of the operation.     Extracorporeal Cardiopulmonary Bypass and Myocardial Protection:  The patient was heparinized systemically.  The right common femoral vein is cannulated through the venous sheath and a guidewire advanced into the right atrium using TEE guidance.  The femoral vein cannulated using a 22 Fr long femoral venous cannula.  The right common femoral artery is cannulated through the arterial sheath and a guidewire advanced into the descending thoracic aorta using TEE guidance.  Femoral artery is cannulated with a 18 French femoral arterial cannula.  The right internal jugular vein is  cannulated through the venous sheath and a guidewire advanced into the right atrium.  The internal jugular vein is cannulated using a 14 French pediatric femoral venous cannula.   Adequate heparinization is verified.   Cardiopulmonary bypass was begun.  Vacuum assist venous drainage is utilized. An incision is made in the pericardium over the ascending aorta and extended in both directions. Silk traction sutures are placed to retract the pericardium.  Venous drainage and exposure are notably excellent. A retrograde cardioplegia cannula is placed through the right atrium into the coronary sinus using transesophageal echocardiogram guidance.  A left ventricular vent is placed through the right superior pulmonary vein.  An antegrade cardioplegia cannula is placed in the ascending aorta.    The patient is cooled to 32C systemic temperature.  The aortic cross clamp is applied and cold blood cardioplegia is delivered initially in an antegrade fashion through the aortic root using modified [place] cold blood cardioplegia ([place] blood cardioplegia protocol).   Supplemental cardioplegia is given retrograde through the coronary sinus catheter. The initial cardioplegic arrest is rapid with early diastolic arrest.  Myocardial protection was felt to be excellent.   Aortic Valve Replacement:  An oblique transverse aortotomy incision was performed.  The aortic valve was inspected and notable for Sievers type 0 bicuspid aortic valve.  There is no raphae.  The coronary arteries are located 100 degrees opposed from each other.  The valve is severely calcified and there is severe aortic stenosis.  The aortic valve leaflets were excised sharply and the aortic annulus decalcified.  Decalcification was notably tedious and time-consuming.  Some calcification extends beneath the aortic valve annulus extending into the intravalvular fibrosa.  There is also extensive calcification beneath the  right coronary artery ostium.   The aortic annulus was sized to accept a 21 mm prosthesis.  The aortic root and left ventricle were irrigated with copious cold saline solution.  Aortic valve replacement was performed using interrupted horizontal mattress 2-0 Ethibond pledgeted sutures with pledgets in the subannular position.  An Edwards Inspiris Resilia stented pericardial tissue valve (size 21 mm, ref # [id number], serial # [id number]) was implanted uneventfully. The valve seated appropriately with adequate space beneath the left main and right coronary artery.   Procedure Completion:  The aortotomy was closed using a 2-layer closure of running 4-0 Prolene suture.  One final dose of warm retrograde "reanimation dose" cardioplegia was administered retrograde through the coronary sinus catheter while all air was evacuated through the aortic root.  The aortic cross clamp was removed after a total cross clamp time of 100 minutes.  Epicardial pacing wires are fixed to the right ventricular outflow tract and to the right atrial appendage. The patient is rewarmed to 37C temperature.  The pericardial sac was drained using a 28 French Bard drain placed through the anterior port incision.   The patient is weaned and disconnected from cardiopulmonary bypass.  The patient's rhythm at separation from bypass was AV paced.  The patient was weaned from bypass without any inotropic support. Total cardiopulmonary bypass time for the operation was 15 minutes.  Followup transesophageal echocardiogram performed after separation from bypass revealed a well-seated bioprosthetic tissue valve in the aortic position with no perivalvular leak.  Left ventricular function was unchanged from preoperatively.    Protamine was administered to reverse the anticoagulation.  The femoral arterial and venous cannulae were removed uneventfully and all Perclose sutures secured.  The right internal jugular venous cannula is removed and manual pressure held on the right groin and  neck for 30 minutes.  Single lung ventilation was begun. The aortotomy closure was inspected for hemostasis. The right pleural space is irrigated with saline solution and inspected for hemostasis.   A mixture of Exparel liposomal bupivacaine (20 mL) and 0.5% bupivacaine (30 mL) is utilized to create an intercostal nerve block for postoperative analgesia.  The mixture is injected under direct vision into the intercostal neurovascular bundles posteriorly to cover the second through the sixth intercostal nerve roots.  Portions of the solution are also injected into the intercostal neurovascular bundles immediately surrounding the surgical incision and immediately adjacent to the chest tube exit sites.    The right pleural space was drained using a 28 French Bard drain placed through the posterior port incision. The 3rd costal cartilage is reattached to the sternum using fiber tape cerclage.  The miniature thoracotomy incision was closed in multiple layers in routine fashion.    The post-bypass portion of the operation was notable for stable rhythm and hemodynamics.   No blood products were administered during the operation.   Disposition:  The patient tolerated the procedure well. There were no intraoperative complications. All sponge instrument and needle counts are verified correct at completion of the operation.   The patient was extubated in the operating room and transported to the surgical intensive care unit in stable condition.  He remained somnolent and was eventually reintubated using a single lumen endotracheal tube by Dr. Tobias Alexander in the intensive care unit due to persistent somnolence with hypoventilation.     Valentina Gu. Roxy Manns MD 07/23/2019 1:25 PM

## 2019-07-23 NOTE — Interval H&P Note (Signed)
History and Physical Interval Note:  07/23/2019 6:07 AM  Jerry Foster  has presented today for surgery, with the diagnosis of AS.  The various methods of treatment have been discussed with the patient and family. After consideration of risks, benefits and other options for treatment, the patient has consented to  Procedure(s): MINIMALLY INVASIVE AORTIC VALVE REPLACEMENT (AVR) (N/A) TRANSESOPHAGEAL ECHOCARDIOGRAM (TEE) (N/A) as a surgical intervention.  The patient's history has been reviewed, patient examined, no change in status, stable for surgery.  I have reviewed the patient's chart and labs.  Questions were answered to the patient's satisfaction.     Rexene Alberts

## 2019-07-23 NOTE — Anesthesia Procedure Notes (Signed)
Arterial Line Insertion Start/End4/30/2021 6:40 AM, 07/23/2019 6:45 AM Performed by: Amadeo Garnet, CRNA, CRNA  Patient location: Pre-op. Preanesthetic checklist: patient identified, IV checked, site marked, risks and benefits discussed, surgical consent, monitors and equipment checked, pre-op evaluation, timeout performed and anesthesia consent Lidocaine 1% used for infiltration Left, radial was placed Catheter size: 20 G Hand hygiene performed  and maximum sterile barriers used   Attempts: 1 Procedure performed without using ultrasound guided technique. Following insertion, Biopatch and dressing applied. Post procedure assessment: normal  Patient tolerated the procedure well with no immediate complications.

## 2019-07-23 NOTE — Procedures (Signed)
Extubation Procedure Note  Patient Details:   Name: Jerry Foster DOB: [birth year] MRN: [id number]   Airway Documentation:    Vent end date: 07/23/19 Vent end time: 1755   Evaluation  O2 sats: stable throughout Complications: No apparent complications Patient did tolerate procedure well. Bilateral Breath Sounds: Clear, Diminished   Yes, pt could speak post extubation.  Pt extubated to 4 l/m [place] per protocol.  Earney Navy 07/23/2019, 6:08 PM

## 2019-07-23 NOTE — Brief Op Note (Addendum)
07/23/2019  12:02 PM  PATIENT:  Jerry Foster  69 y.o. male  PRE-OPERATIVE DIAGNOSIS:  AORTIC STENOSIS  POST-OPERATIVE DIAGNOSIS:  AORTIC STENOSIS, BICUSPID AORTIC VALVE  PROCEDURE:   MINIMALLY INVASIVE AORTIC VALVE REPLACEMENT (AVR) using Edwards INSPIRIS Resilia 21 MM Aortic Valve. (N/A) TRANSESOPHAGEAL ECHOCARDIOGRAM (TEE)   SURGEON:  Rexene Alberts, MD - Primary  PHYSICIAN ASSISTANT: [place]  ANESTHESIA:   general  EBL:  Per anesthesia and perfusion records   BLOOD ADMINISTERED:none  DRAINS: mediastinal and right pleural drains   LOCAL MEDICATIONS USED:  NONE  SPECIMEN:  Source of Specimen:  Aortic valve leaflets  DISPOSITION OF SPECIMEN:  PATHOLOGY  COUNTS:  YES  DICTATION: .Dragon Dictation  PLAN OF CARE: Admit to inpatient   PATIENT DISPOSITION:  ICU - intubated and hemodynamically stable.   Delay start of Pharmacological VTE agent (>24hrs) due to surgical blood loss or risk of bleeding: yes

## 2019-07-23 NOTE — Anesthesia Procedure Notes (Signed)
Procedure Name: Intubation Date/Time: 07/23/2019 3:16 PM Performed by: Amadeo Garnet, CRNA Pre-anesthesia Checklist: Patient identified, Emergency Drugs available, Suction available and Patient being monitored Patient Re-evaluated:Patient Re-evaluated prior to induction Oxygen Delivery Method: Ambu bag Preoxygenation: Pre-oxygenation with 100% oxygen Induction Type: IV induction Ventilation: Mask ventilation without difficulty Laryngoscope Size: Glidescope and 4 Grade View: Grade I Tube type: Oral Tube size: 8.0 mm Number of attempts: 1 Airway Equipment and Method: Stylet and Video-laryngoscopy Placement Confirmation: ETT inserted through vocal cords under direct vision,  positive ETCO2 and breath sounds checked- equal and bilateral Secured at: 22 cm Tube secured with: Tape Dental Injury: Teeth and Oropharynx as per pre-operative assessment

## 2019-07-23 NOTE — Progress Notes (Signed)
  Echocardiogram Echocardiogram Transesophageal has been performed.  Bobbye Charleston 07/23/2019, 8:49 AM

## 2019-07-23 NOTE — Anesthesia Preprocedure Evaluation (Addendum)
Anesthesia Evaluation  Patient identified by MRN, date of birth, ID band Patient awake    Reviewed: Allergy & Precautions, NPO status , Patient's Chart, lab work & pertinent test results  History of Anesthesia Complications Negative for: history of anesthetic complications  Airway Mallampati: II  TM Distance: >3 FB Neck ROM: Full    Dental no notable dental hx. (+) Dental Advisory Given   Pulmonary neg pulmonary ROS,    Pulmonary exam normal        Cardiovascular +CHF  Normal cardiovascular exam+ Valvular Problems/Murmurs AS   RIGHT HEART CATH AND CORONARY ANGIOGRAPHY Conclusion  1. Mild nonobstructive CAD without any high-grade or flow-limiting stenoses 2. Bulky aortic valve calcification with restricted leaflet mobility 3. Normal right heart pressures/preserved cardiac output  Plan: CTA studies, cardiac surgical evaluation for consideration of aortic valve replacement     Neuro/Psych negative neurological ROS     GI/Hepatic negative GI ROS, Neg liver ROS,   Endo/Other  negative endocrine ROS  Renal/GU negative Renal ROS     Musculoskeletal negative musculoskeletal ROS (+)   Abdominal   Peds  Hematology negative hematology ROS (+)   Anesthesia Other Findings Day of surgery medications reviewed with the patient.  Reproductive/Obstetrics                            Anesthesia Physical Anesthesia Plan  ASA: III  Anesthesia Plan: General   Post-op Pain Management:    Induction: Intravenous  PONV Risk Score and Plan: 3 and Ondansetron, Dexamethasone and Midazolam  Airway Management Planned: Oral ETT  Additional Equipment: Arterial line, PA Cath, 3D TEE and Ultrasound Guidance Line Placement  Intra-op Plan:   Post-operative Plan: Possible Post-op intubation/ventilation  Informed Consent: I have reviewed the patients History and Physical, chart, labs and discussed the  procedure including the risks, benefits and alternatives for the proposed anesthesia with the patient or authorized representative who has indicated his/her understanding and acceptance.     Dental advisory given  Plan Discussed with: Anesthesiologist and CRNA  Anesthesia Plan Comments:        Anesthesia Quick Evaluation

## 2019-07-23 NOTE — Transfer of Care (Signed)
Immediate Anesthesia Transfer of Care Note  Patient: Jerry Foster  Procedure(s) Performed: MINIMALLY INVASIVE AORTIC VALVE REPLACEMENT (AVR) using Edwards INSPIRIS Resilia 21 MM Aortic Valve. (N/A Chest) TRANSESOPHAGEAL ECHOCARDIOGRAM (TEE) (N/A )  Patient Location: SICU  Anesthesia Type:General  Level of Consciousness: drowsy  Airway & Oxygen Therapy: Patient Spontanous Breathing and Patient connected to face mask oxygen  Post-op Assessment: Report given to RN and Post -op Vital signs reviewed and stable  Post vital signs: Reviewed and stable  Last Vitals:  Vitals Value Taken Time  BP    Temp    Pulse    Resp    SpO2      Last Pain:  Vitals:   07/23/19 0616  TempSrc:   PainSc: 0-No pain      Patients Stated Pain Goal: 3 ([id number])  Complications: No apparent anesthesia complications

## 2019-07-23 NOTE — Anesthesia Postprocedure Evaluation (Signed)
Anesthesia Post Note  Patient: Jerry Foster  Procedure(s) Performed: MINIMALLY INVASIVE AORTIC VALVE REPLACEMENT (AVR) using Edwards INSPIRIS Resilia 21 MM Aortic Valve. (N/A Chest) TRANSESOPHAGEAL ECHOCARDIOGRAM (TEE) (N/A )     Patient location during evaluation: SICU Anesthesia Type: General Level of consciousness: sedated Pain management: pain level controlled Vital Signs Assessment: post-procedure vital signs reviewed and stable Respiratory status: patient re-intubated Cardiovascular status: stable Postop Assessment: no apparent nausea or vomiting Anesthetic complications: no Comments: Attempted trial of extubation, patient too somnolent.  Re-intubated without incident.     Last Vitals:  Vitals:   07/23/19 1545 07/23/19 1600  BP:    Pulse: 80 80  Resp: 18 18  Temp: 36.7 C 36.6 C  SpO2: 100% 100%    Last Pain:  Vitals:   07/23/19 1600  TempSrc: Core  PainSc:                  Mendell Bontempo,Manasseh DANIEL

## 2019-07-24 ENCOUNTER — Inpatient Hospital Stay (HOSPITAL_COMMUNITY): Payer: Medicare Other

## 2019-07-24 LAB — BASIC METABOLIC PANEL
Anion gap: 6 (ref 5–15)
Anion gap: 7 (ref 5–15)
BUN: 15 mg/dL (ref 8–23)
BUN: 16 mg/dL (ref 8–23)
CO2: 21 mmol/L — ABNORMAL LOW (ref 22–32)
CO2: 21 mmol/L — ABNORMAL LOW (ref 22–32)
Calcium: 7.6 mg/dL — ABNORMAL LOW (ref 8.9–10.3)
Calcium: 7.9 mg/dL — ABNORMAL LOW (ref 8.9–10.3)
Chloride: 111 mmol/L (ref 98–111)
Chloride: 111 mmol/L (ref 98–111)
Creatinine, Ser: 0.72 mg/dL (ref 0.61–1.24)
Creatinine, Ser: 0.72 mg/dL (ref 0.61–1.24)
GFR calc Af Amer: >60 mL/min (ref >60)
GFR calc Af Amer: >60 mL/min (ref >60)
GFR calc non Af Amer: >60 mL/min (ref >60)
GFR calc non Af Amer: >60 mL/min (ref >60)
Glucose, Bld: 167 mg/dL — ABNORMAL HIGH (ref 70–99)
Glucose, Bld: 145 mg/dL — ABNORMAL HIGH (ref 70–99)
Potassium: 3.8 mmol/L (ref 3.5–5.1)
Potassium: 3.7 mmol/L (ref 3.5–5.1)
Sodium: 138 mmol/L (ref 135–145)
Sodium: 139 mmol/L (ref 135–145)

## 2019-07-24 LAB — CBC
HCT: 38.5 % — ABNORMAL LOW (ref 39.0–52.0)
Hemoglobin: 12.9 g/dL — ABNORMAL LOW (ref 13.0–17.0)
MCH: 31.2 pg (ref 26.0–34.0)
MCHC: 33.5 g/dL (ref 30.0–36.0)
MCV: 93 fL (ref 80.0–100.0)
Platelets: 74 10*3/uL — ABNORMAL LOW (ref 150–400)
RBC: 4.14 MIL/uL — ABNORMAL LOW (ref 4.22–5.81)
RDW: 13.2 % (ref 11.5–15.5)
WBC: 13.5 10*3/uL — ABNORMAL HIGH (ref 4.0–10.5)
nRBC: 0 % (ref 0.0–0.2)

## 2019-07-24 LAB — GLUCOSE, CAPILLARY
Glucose-Capillary: 122 mg/dL — ABNORMAL HIGH (ref 70–99)
Glucose-Capillary: 114 mg/dL — ABNORMAL HIGH (ref 70–99)
Glucose-Capillary: 151 mg/dL — ABNORMAL HIGH (ref 70–99)
Glucose-Capillary: 125 mg/dL — ABNORMAL HIGH (ref 70–99)
Glucose-Capillary: 155 mg/dL — ABNORMAL HIGH (ref 70–99)
Glucose-Capillary: 154 mg/dL — ABNORMAL HIGH (ref 70–99)

## 2019-07-24 LAB — POCT I-STAT 7, (LYTES, BLD GAS, ICA,H+H)
Acid-base deficit: 3 mmol/L — ABNORMAL HIGH (ref 0.0–2.0)
Bicarbonate: 22 mmol/L (ref 20.0–28.0)
Calcium, Ion: 1.2 mmol/L (ref 1.15–1.40)
HCT: 37 % — ABNORMAL LOW (ref 39.0–52.0)
Hemoglobin: 12.6 g/dL — ABNORMAL LOW (ref 13.0–17.0)
O2 Saturation: 98 %
Patient temperature: 37.2
Potassium: 3.7 mmol/L (ref 3.5–5.1)
Sodium: 141 mmol/L (ref 135–145)
TCO2: 23 mmol/L (ref 22–32)
pCO2 arterial: 38.6 mmHg (ref 32.0–48.0)
pH, Arterial: 7.364 (ref 7.350–7.450)
pO2, Arterial: 101 mmHg (ref 83.0–108.0)

## 2019-07-24 LAB — MAGNESIUM
Magnesium: 2 mg/dL (ref 1.7–2.4)
Magnesium: 2 mg/dL (ref 1.7–2.4)

## 2019-07-24 MED ORDER — INSULIN ASPART 100 UNIT/ML ~~LOC~~ SOLN
0.0000 [IU] | SUBCUTANEOUS | Status: DC
  Administered 2019-07-24 – 2019-07-25 (×3): 2 [IU] via SUBCUTANEOUS

## 2019-07-24 MED ORDER — LIP MEDEX EX OINT
TOPICAL_OINTMENT | CUTANEOUS | Status: DC | PRN
  Filled 2019-07-24: qty 7

## 2019-07-24 MED ORDER — AMIODARONE IV BOLUS ONLY 150 MG/100ML
150.0000 mg | Freq: Once | INTRAVENOUS | Status: AC
  Administered 2019-07-24: 150 mg via INTRAVENOUS
  Filled 2019-07-24: qty 100

## 2019-07-24 MED ORDER — PRAVASTATIN SODIUM 40 MG PO TABS
40.0000 mg | ORAL_TABLET | Freq: Every day | ORAL | Status: DC
  Administered 2019-07-26 – 2019-08-01 (×7): 40 mg via ORAL
  Filled 2019-07-24 (×7): qty 1

## 2019-07-24 MED ORDER — AMIODARONE HCL IN DEXTROSE 360-4.14 MG/200ML-% IV SOLN
60.0000 mg/h | INTRAVENOUS | Status: AC
  Administered 2019-07-24 (×2): 60 mg/h via INTRAVENOUS
  Filled 2019-07-24 (×2): qty 200

## 2019-07-24 MED ORDER — POTASSIUM CHLORIDE 10 MEQ/50ML IV SOLN
10.0000 meq | INTRAVENOUS | Status: AC
  Administered 2019-07-24 (×3): 10 meq via INTRAVENOUS
  Filled 2019-07-24 (×3): qty 50

## 2019-07-24 MED ORDER — AMIODARONE HCL IN DEXTROSE 360-4.14 MG/200ML-% IV SOLN
30.0000 mg/h | INTRAVENOUS | Status: DC
  Administered 2019-07-24 – 2019-07-28 (×6): 30 mg/h via INTRAVENOUS
  Filled 2019-07-24 (×4): qty 200
  Filled 2019-07-24: qty 400
  Filled 2019-07-24: qty 200

## 2019-07-24 MED ORDER — ENOXAPARIN SODIUM 40 MG/0.4ML ~~LOC~~ SOLN
40.0000 mg | Freq: Every day | SUBCUTANEOUS | Status: DC
Start: 2019-07-24 — End: 2019-07-24

## 2019-07-24 MED ORDER — INSULIN ASPART 100 UNIT/ML ~~LOC~~ SOLN: 0.0000 [IU] | SUBCUTANEOUS | Status: DC

## 2019-07-24 MED ORDER — ENOXAPARIN SODIUM 30 MG/0.3ML ~~LOC~~ SOLN
30.0000 mg | Freq: Every day | SUBCUTANEOUS | Status: DC
  Administered 2019-07-25 – 2019-07-28 (×4): 30 mg via SUBCUTANEOUS
  Filled 2019-07-24 (×4): qty 0.3

## 2019-07-24 NOTE — Progress Notes (Signed)
      [address]       [place] [address]             [phone]                 1 Day Post-Op Procedure(s) (LRB): MINIMALLY INVASIVE AORTIC VALVE REPLACEMENT (AVR) using Edwards INSPIRIS Resilia 21 MM Aortic Valve. (N/A) TRANSESOPHAGEAL ECHOCARDIOGRAM (TEE) (N/A)   Events: Long sinus pause this am afib as well  _______________________________________________________________ Vitals: BP 112/64   Pulse 81   Temp 98.4 F (36.9 C)   Resp (!) 21   Ht 5\' 9"  (1.753 m)   Wt 78.4 kg   SpO2 99%   BMI 25.52 kg/m   - Neuro: alert NAd  - Cardiovascular: afib  Drips: none.   PAP: (18-36)/(7-29) 25/8 CO:  [2.6 L/min-6.8 L/min] 6.8 L/min CI:  [1.4 L/min/m2-3.7 L/min/m2] 3.7 L/min/m2  - Pulm: EWOB, clear, on RA ABG    Component Value Date/Time   PHART 7.364 07/24/2019 0327   PCO2ART 38.6 07/24/2019 0327   PO2ART 101 07/24/2019 0327   HCO3 22.0 07/24/2019 0327   TCO2 23 07/24/2019 0327   ACIDBASEDEF 3.0 (H) 07/24/2019 0327   O2SAT 98.0 07/24/2019 0327    - Abd: ND - Extremity: trace edema  .Intake/Output      04/30 0701 - 05/01 0700 05/01 0701 - 05/02 0700   P.O. 180 120   I.V. (mL/kg) 4239.1 (54.1) 31.1 (0.4)   Blood 550    IV Piggyback 780.5    Total Intake(mL/kg) 5749.6 (73.3) 151.1 (1.9)   Urine (mL/kg/hr) 2725 (1.4)    Blood 900    Chest Tube 310    Total Output 3935    Net +1814.6 +151.1           _______________________________________________________________ Labs: CBC Latest Ref Rng & Units 07/24/2019 07/24/2019 07/23/2019  WBC 4.0 - 10.5 K/uL - 13.5(H) 12.3(H)  Hemoglobin 13.0 - 17.0 g/dL 12.6(L) 12.9(L) 13.0  Hematocrit 39.0 - 52.0 % 37.0(L) 38.5(L) 38.8(L)  Platelets 150 - 400 K/uL - 74(L) 74(L)   CMP Latest Ref Rng & Units 07/24/2019 07/24/2019 07/23/2019  Glucose 70 - 99 mg/dL - 145(H) 167(H)  BUN 8 - 23 mg/dL - 16 15  Creatinine 0.61 - 1.24 mg/dL - 0.72 0.72  Sodium 135 - 145 mmol/L 141 139 138  Potassium 3.5 - 5.1 mmol/L 3.7  3.7 3.8  Chloride 98 - 111 mmol/L - 111 111  CO2 22 - 32 mmol/L - 21(L) 21(L)  Calcium 8.9 - 10.3 mg/dL - 7.9(L) 7.6(L)  Total Protein 6.5 - 8.1 g/dL - - -  Total Bilirubin 0.3 - 1.2 mg/dL - - -  Alkaline Phos 38 - 126 U/L - - -  AST 15 - 41 U/L - - -  ALT 0 - 44 U/L - - -    CXR: PV congestion  _______________________________________________________________  Assessment and Plan: POD 1 s/p mini AVR  Neuro: pain controlled. Dizziness, likely due to HR CV: starting amio gtt.  Pacer rate increased to 70 Pulm: will keep CT Renal: good uop GI: on diet Heme: stable ID: afebrile Endo: SSI Dispo: continue ICU care  Melodie Bouillon, MD 07/24/2019 10:55 AM

## 2019-07-24 NOTE — Plan of Care (Signed)
  Problem: Activity: Goal: Risk for activity intolerance will decrease Outcome: Progressing   Problem: Cardiac: Goal: Will achieve and/or maintain hemodynamic stability Outcome: Progressing   Problem: Respiratory: Goal: Respiratory status will improve Outcome: Progressing   Problem: Urinary Elimination: Goal: Ability to achieve and maintain adequate renal perfusion and functioning will improve Outcome: Progressing   

## 2019-07-25 ENCOUNTER — Inpatient Hospital Stay (HOSPITAL_COMMUNITY): Payer: Medicare Other

## 2019-07-25 DIAGNOSIS — I4891 Unspecified atrial fibrillation: Secondary | ICD-10-CM | POA: Diagnosis not present

## 2019-07-25 LAB — CBC
HCT: 39.1 % (ref 39.0–52.0)
Hemoglobin: 13 g/dL (ref 13.0–17.0)
MCH: 31.6 pg (ref 26.0–34.0)
MCHC: 33.2 g/dL (ref 30.0–36.0)
MCV: 95.1 fL (ref 80.0–100.0)
Platelets: 70 10*3/uL — ABNORMAL LOW (ref 150–400)
RBC: 4.11 MIL/uL — ABNORMAL LOW (ref 4.22–5.81)
RDW: 13.3 % (ref 11.5–15.5)
WBC: 16.1 10*3/uL — ABNORMAL HIGH (ref 4.0–10.5)
nRBC: 0 % (ref 0.0–0.2)

## 2019-07-25 LAB — BASIC METABOLIC PANEL
Anion gap: 12 (ref 5–15)
BUN: 26 mg/dL — ABNORMAL HIGH (ref 8–23)
CO2: 22 mmol/L (ref 22–32)
Calcium: 7.8 mg/dL — ABNORMAL LOW (ref 8.9–10.3)
Chloride: 97 mmol/L — ABNORMAL LOW (ref 98–111)
Creatinine, Ser: 0.74 mg/dL (ref 0.61–1.24)
GFR calc Af Amer: >60 mL/min (ref >60)
GFR calc non Af Amer: >60 mL/min (ref >60)
Glucose, Bld: 373 mg/dL — ABNORMAL HIGH (ref 70–99)
Potassium: 4 mmol/L (ref 3.5–5.1)
Sodium: 131 mmol/L — ABNORMAL LOW (ref 135–145)

## 2019-07-25 LAB — GLUCOSE, CAPILLARY
Glucose-Capillary: 120 mg/dL — ABNORMAL HIGH (ref 70–99)
Glucose-Capillary: 123 mg/dL — ABNORMAL HIGH (ref 70–99)
Glucose-Capillary: 115 mg/dL — ABNORMAL HIGH (ref 70–99)
Glucose-Capillary: 109 mg/dL — ABNORMAL HIGH (ref 70–99)
Glucose-Capillary: 91 mg/dL (ref 70–99)
Glucose-Capillary: 104 mg/dL — ABNORMAL HIGH (ref 70–99)

## 2019-07-25 LAB — MAGNESIUM: Magnesium: 1.8 mg/dL (ref 1.7–2.4)

## 2019-07-25 NOTE — Progress Notes (Signed)
Pt assisted to bsc and also to stand at bedside, unable to void. Bladder scanned for 136cc, encourage fluids and monitor.Ellamae Sia

## 2019-07-25 NOTE — Progress Notes (Signed)
Per shift report patient did  Not void all n ight. Pt was bladder scanned twice with volumes less than 250cc. Continue to monitor. Pt has urge to void ,void 50 cc, will bladder scan if no further void in an hour. Pt has not voided since foley removed at 1700 yesterday. Ellamae Sia

## 2019-07-25 NOTE — Progress Notes (Signed)
      [address]       [place] [address]             [phone]                 2 Days Post-Op Procedure(s) (LRB): MINIMALLY INVASIVE AORTIC VALVE REPLACEMENT (AVR) using Edwards INSPIRIS Resilia 21 MM Aortic Valve. (N/A) TRANSESOPHAGEAL ECHOCARDIOGRAM (TEE) (N/A)   Events: afib yesterday.  Heart block this am  _______________________________________________________________ Vitals: BP 120/84   Pulse 86   Temp 97.8 F (36.6 C) (Oral)   Resp (!) 24   Ht 5\' 9"  (1.753 m)   Wt 78.2 kg   SpO2 97%   BMI 25.46 kg/m   - Neuro: alert NAD  - Cardiovascular: pacer dependent, complete heart block.  Stable p waves, very slow junctional escape  Drips: none.      - PulmMarzetta Merino, clear, on RA ABG    Component Value Date/Time   PHART 7.364 07/24/2019 0327   PCO2ART 38.6 07/24/2019 0327   PO2ART 101 07/24/2019 0327   HCO3 22.0 07/24/2019 0327   TCO2 23 07/24/2019 0327   ACIDBASEDEF 3.0 (H) 07/24/2019 0327   O2SAT 98.0 07/24/2019 0327    - Abd: ND - Extremity: trace edema  .Intake/Output      05/01 0701 - 05/02 0700 05/02 0701 - 05/03 0700   P.O. 720 360   I.V. (mL/kg) 889.5 (11.4) 53.4 (0.7)   Blood     IV Piggyback 250 100.1   Total Intake(mL/kg) 1859.5 (23.8) 513.5 (6.6)   Urine (mL/kg/hr) 225 (0.1) 50 (0.2)   Blood     Chest Tube 520 0   Total Output 745 50   Net +1114.5 +463.5           _______________________________________________________________ Labs: CBC Latest Ref Rng & Units 07/25/2019 07/24/2019 07/24/2019  WBC 4.0 - 10.5 K/uL 16.1(H) - 13.5(H)  Hemoglobin 13.0 - 17.0 g/dL 13.0 12.6(L) 12.9(L)  Hematocrit 39.0 - 52.0 % 39.1 37.0(L) 38.5(L)  Platelets 150 - 400 K/uL 70(L) - 74(L)   CMP Latest Ref Rng & Units 07/25/2019 07/24/2019 07/24/2019  Glucose 70 - 99 mg/dL 373(H) - 145(H)  BUN 8 - 23 mg/dL 26(H) - 16  Creatinine 0.61 - 1.24 mg/dL 0.74 - 0.72  Sodium 135 - 145 mmol/L 131(L) 141 139  Potassium 3.5 - 5.1 mmol/L 4.0 3.7 3.7  Chloride  98 - 111 mmol/L 97(L) - 111  CO2 22 - 32 mmol/L 22 - 21(L)  Calcium 8.9 - 10.3 mg/dL 7.8(L) - 7.9(L)  Total Protein 6.5 - 8.1 g/dL - - -  Total Bilirubin 0.3 - 1.2 mg/dL - - -  Alkaline Phos 38 - 126 U/L - - -  AST 15 - 41 U/L - - -  ALT 0 - 44 U/L - - -    CXR: stable  _______________________________________________________________  Assessment and Plan: POD 2 s/p mini AVR  Neuro: pain controlled. Dizziness, likely due to HR CV: holding amio and BB for now.  Pacer dependent.   Pulm: will keep CT.  SS output, but > 500 Renal: good uop GI: on diet Heme: stable ID: afebrile Endo: SSI Dispo: continue ICU care for HR.  Melodie Bouillon, MD 07/25/2019 10:41 AM

## 2019-07-25 NOTE — Progress Notes (Signed)
Pt is still unable to void. Has not voided since foley dc yesterday at 1700(except for 50 cc). Bladder scanned for 713 cc and I/O cathed for 900 cc amber urine at 1630. Ellamae Sia

## 2019-07-25 NOTE — Progress Notes (Signed)
     [address]       [place] [address]             [phone]       No events.   Remains paced SS CT output  Cambelle Suchecki O Alvina Strother

## 2019-07-26 LAB — CBC
HCT: 41.6 % (ref 39.0–52.0)
Hemoglobin: 13.8 g/dL (ref 13.0–17.0)
MCH: 30.8 pg (ref 26.0–34.0)
MCHC: 33.2 g/dL (ref 30.0–36.0)
MCV: 92.9 fL (ref 80.0–100.0)
Platelets: 75 10*3/uL — ABNORMAL LOW (ref 150–400)
RBC: 4.48 MIL/uL (ref 4.22–5.81)
RDW: 13 % (ref 11.5–15.5)
WBC: 12.1 10*3/uL — ABNORMAL HIGH (ref 4.0–10.5)
nRBC: 0 % (ref 0.0–0.2)

## 2019-07-26 LAB — GLUCOSE, CAPILLARY
Glucose-Capillary: 109 mg/dL — ABNORMAL HIGH (ref 70–99)
Glucose-Capillary: 136 mg/dL — ABNORMAL HIGH (ref 70–99)
Glucose-Capillary: 74 mg/dL (ref 70–99)
Glucose-Capillary: 80 mg/dL (ref 70–99)
Glucose-Capillary: 89 mg/dL (ref 70–99)
Glucose-Capillary: 95 mg/dL (ref 70–99)

## 2019-07-26 LAB — BASIC METABOLIC PANEL
Anion gap: 7 (ref 5–15)
BUN: 23 mg/dL (ref 8–23)
CO2: 26 mmol/L (ref 22–32)
Calcium: 8.5 mg/dL — ABNORMAL LOW (ref 8.9–10.3)
Chloride: 102 mmol/L (ref 98–111)
Creatinine, Ser: 0.71 mg/dL (ref 0.61–1.24)
GFR calc Af Amer: >60 mL/min (ref >60)
GFR calc non Af Amer: >60 mL/min (ref >60)
Glucose, Bld: 118 mg/dL — ABNORMAL HIGH (ref 70–99)
Potassium: 4.3 mmol/L (ref 3.5–5.1)
Sodium: 135 mmol/L (ref 135–145)

## 2019-07-26 LAB — MAGNESIUM: Magnesium: 1.8 mg/dL (ref 1.7–2.4)

## 2019-07-26 LAB — SURGICAL PATHOLOGY

## 2019-07-26 MED ORDER — TAMSULOSIN HCL 0.4 MG PO CAPS
0.4000 mg | ORAL_CAPSULE | Freq: Every day | ORAL | Status: DC
  Administered 2019-07-26 – 2019-07-30 (×4): 0.4 mg via ORAL
  Filled 2019-07-26 (×7): qty 1

## 2019-07-26 MED ORDER — MAGNESIUM SULFATE 2 GM/50ML IV SOLN
2.0000 g | Freq: Once | INTRAVENOUS | Status: AC
  Administered 2019-07-26: 2 g via INTRAVENOUS
  Filled 2019-07-26: qty 50

## 2019-07-26 MED ORDER — FUROSEMIDE 10 MG/ML IJ SOLN
20.0000 mg | Freq: Two times a day (BID) | INTRAMUSCULAR | Status: AC
  Administered 2019-07-26 (×2): 20 mg via INTRAVENOUS
  Filled 2019-07-26 (×2): qty 2

## 2019-07-26 MED ORDER — AMIODARONE IV BOLUS ONLY 150 MG/100ML
150.0000 mg | Freq: Once | INTRAVENOUS | Status: AC
  Administered 2019-07-26: 150 mg via INTRAVENOUS
  Filled 2019-07-26: qty 100

## 2019-07-26 MED ORDER — AMIODARONE IV BOLUS ONLY 150 MG/100ML
150.0000 mg | Freq: Once | INTRAVENOUS | Status: AC
  Administered 2019-07-26: 150 mg via INTRAVENOUS

## 2019-07-26 MED ORDER — ~~LOC~~ CARDIAC SURGERY, PATIENT & FAMILY EDUCATION: Freq: Once | Status: AC

## 2019-07-26 MED FILL — Bupivacaine Liposome Inj 1.3% (13.3 MG/ML): INTRAMUSCULAR | Qty: 20 | Status: AC

## 2019-07-26 MED FILL — Lidocaine HCl Local Preservative Free (PF) Inj 2%: INTRAMUSCULAR | Qty: 15 | Status: AC

## 2019-07-26 MED FILL — Heparin Sodium (Porcine) Inj 1000 Unit/ML: INTRAMUSCULAR | Qty: 2500 | Status: AC

## 2019-07-26 MED FILL — Heparin Sodium (Porcine) Inj 1000 Unit/ML: INTRAMUSCULAR | Qty: 30 | Status: AC

## 2019-07-26 MED FILL — Potassium Chloride Inj 2 mEq/ML: INTRAVENOUS | Qty: 40 | Status: AC

## 2019-07-26 MED FILL — Vancomycin HCl For IV Soln 1 GM (Base Equivalent): INTRAVENOUS | Qty: 1000 | Status: AC

## 2019-07-26 NOTE — Addendum Note (Signed)
Addendum  created 07/26/19 0904 by Josephine Igo, CRNA   Order list changed

## 2019-07-26 NOTE — Plan of Care (Signed)
Pt alert, oriented, less restless than the night before. Pt has been able to rest throughout the night, has only complained of pain once thus far. Pt is able to move more comfortably and has less overall fatigue. Pt is on room air and pulls 1000 on IS. Heart rate goes back and forth from complete heart block to AFIB with heart rate in the 90s. Blood pressure is stable. Minimal CT output. Pt voices no complaints or concerns at this time. Call bell is within reach and bed is in lowest position. Problem: Education: Goal: Knowledge of General Education information will improve Description: Including pain rating scale, medication(s)/side effects and non-pharmacologic comfort measures 07/26/2019 0312 by Dewayne Hatch, RN Outcome: Progressing 07/26/2019 0246 by Dewayne Hatch, RN Outcome: Progressing   Problem: Clinical Measurements: Goal: Ability to maintain clinical measurements within normal limits will improve Outcome: Progressing Goal: Respiratory complications will improve Outcome: Progressing Goal: Cardiovascular complication will be avoided Outcome: Progressing   Problem: Activity: Goal: Risk for activity intolerance will decrease Outcome: Progressing   Problem: Coping: Goal: Level of anxiety will decrease Outcome: Progressing   Problem: Pain Managment: Goal: General experience of comfort will improve Outcome: Progressing

## 2019-07-26 NOTE — Plan of Care (Signed)

## 2019-07-26 NOTE — Progress Notes (Signed)
CT surgery  Feels better after chest tubes are out afib 118 - rebolus amiodarone voided after foley out

## 2019-07-26 NOTE — Progress Notes (Addendum)
[facility] DAILY ICU PROGRESS NOTE                   [address]            [place] [address]          [phone]   3 Days Post-Op Procedure(s) (LRB): MINIMALLY INVASIVE AORTIC VALVE REPLACEMENT (AVR) using Edwards INSPIRIS Resilia 21 MM Aortic Valve. (N/A) TRANSESOPHAGEAL ECHOCARDIOGRAM (TEE) (N/A)  Total Length of Stay:  LOS: 3 days   Subjective: Awake and alert, no new complaints.  Now in a-fib with controlled VR. Amiodarone resumed by IV infusion.  Remains on RA with acceptable O2 sats.   Objective: Vital signs in last 24 hours: Temp:  [97.7 F (36.5 C)-99.3 F (37.4 C)] 99.3 F (37.4 C) (05/03 0728) Pulse Rate:  [40-118] 49 (05/03 0700) Cardiac Rhythm: Atrial fibrillation (05/03 0800) Resp:  [9-32] 21 (05/03 1000) BP: (101-147)/(66-100) 118/75 (05/03 1000) SpO2:  [91 %-96 %] 95 % (05/03 0700) Weight:  [74.8 kg] 74.8 kg (05/03 0500)  Filed Weights   07/24/19 0600 07/25/19 0600 07/26/19 0500  Weight: 78.4 kg 78.2 kg 74.8 kg    Weight change: -3.4 kg     Intake/Output from previous day: 05/02 0701 - 05/03 0700 In: 1240.9 [P.O.:1020; I.V.:120.8; IV Piggyback:100.1] Out: 2000 [Urine:1600; Chest Tube:400]  Intake/Output this shift: Total I/O In: 213.4 [P.O.:120; I.V.:93.4] Out: 90 [Chest Tube:90]  Current Meds: Scheduled Meds: . acetaminophen  1,000 mg Oral Q6H  . aspirin EC  325 mg Oral Daily  . bisacodyl  10 mg Oral Daily   Or  . bisacodyl  10 mg Rectal Daily  . chlorhexidine gluconate (MEDLINE KIT)  15 mL Mouth Rinse BID  . Chlorhexidine Gluconate Cloth  6 each Topical Daily  . docusate sodium  200 mg Oral Daily  . enoxaparin (LOVENOX) injection  30 mg Subcutaneous QHS  . furosemide  20 mg Intravenous BID  . pantoprazole  40 mg Oral Daily  . pravastatin  40 mg Oral Daily  . sodium chloride flush  3 mL Intravenous Q12H   Continuous Infusions: . sodium chloride    . amiodarone 30 mg/hr (07/26/19 1000)  . lactated ringers    .  lactated ringers 10 mL/hr at 07/25/19 0000   PRN Meds:.lip balm, metoprolol tartrate, morphine injection, ondansetron (ZOFRAN) IV, oxyCODONE, sodium chloride flush, traMADol  General appearance: alert, cooperative and mild distress Neurologic: intact Heart: irregularly irregular rhythm Lungs: Breath sonuds clear, good sats on RA.  Extremities: Trace LE edema Wound: the right chest dressing is well approximated and dry.   Lab Results: CBC: Recent Labs    07/25/19 0435 07/26/19 0421  WBC 16.1* 12.1*  HGB 13.0 13.8  HCT 39.1 41.6  PLT 70* 75*   BMET:  Recent Labs    07/25/19 0435 07/26/19 0421  NA 131* 135  K 4.0 4.3  CL 97* 102  CO2 22 26  GLUCOSE 373* 118*  BUN 26* 23  CREATININE 0.74 0.71  CALCIUM 7.8* 8.5*    CMET: Lab Results  Component Value Date   WBC 12.1 (H) 07/26/2019   HGB 13.8 07/26/2019   HCT 41.6 07/26/2019   PLT 75 (L) 07/26/2019   GLUCOSE 118 (H) 07/26/2019   ALT 26 07/19/2019   AST 29 07/19/2019   NA 135 07/26/2019   K 4.3 07/26/2019   CL 102 07/26/2019   CREATININE 0.71 07/26/2019   BUN 23 07/26/2019   CO2 26 07/26/2019  INR 1.4 (H) 07/23/2019   HGBA1C 5.7 (H) 07/19/2019      PT/INR:  Recent Labs    07/23/19 1439  LABPROT 16.4*  INR 1.4*   Radiology: No results found.   Assessment/Plan: S/P Procedure(s) (LRB): MINIMALLY INVASIVE AORTIC VALVE REPLACEMENT (AVR) using Edwards INSPIRIS Resilia 21 MM Aortic Valve. (N/A) TRANSESOPHAGEAL ECHOCARDIOGRAM (TEE) (N/A)  -POD-3 MINI aortic valve replacement for severe AS with bicuspid aortic valve.  BP stable. Removing chest tubes today. Advance activity as tolerated.  -Post-op atrial fibrillation- rate control is reasonable and SBP is 120-189mHg. IV amiodarone load initiated this AM. K+ 4.3, Mg++ 1.8 (replacement ordered).  -BPH-required I&O cath x 1 this AM with 6515murine drained. Will add Flomax.  Volume Excess- Wt 3kg+ since surgery- diurese as BP allows.  -DVT PPX- Continue  Lovenox   MyAntony Odea PA-C [phone]/05/2019 1:12 PM   I have seen and examined the patient and agree with the assessment and plan as outlined.  ClRexene AlbertsMD 07/26/2019

## 2019-07-26 NOTE — Addendum Note (Signed)
Addendum  created 07/26/19 0825 by Josephine Igo, CRNA   Order list changed

## 2019-07-27 ENCOUNTER — Inpatient Hospital Stay (HOSPITAL_COMMUNITY): Payer: Medicare Other

## 2019-07-27 LAB — CBC
HCT: 39.7 % (ref 39.0–52.0)
Hemoglobin: 13.4 g/dL (ref 13.0–17.0)
MCH: 30.9 pg (ref 26.0–34.0)
MCHC: 33.8 g/dL (ref 30.0–36.0)
MCV: 91.7 fL (ref 80.0–100.0)
Platelets: 89 10*3/uL — ABNORMAL LOW (ref 150–400)
RBC: 4.33 MIL/uL (ref 4.22–5.81)
RDW: 12.8 % (ref 11.5–15.5)
WBC: 7.1 10*3/uL (ref 4.0–10.5)
nRBC: 0 % (ref 0.0–0.2)

## 2019-07-27 LAB — BASIC METABOLIC PANEL
Anion gap: 11 (ref 5–15)
BUN: 21 mg/dL (ref 8–23)
CO2: 28 mmol/L (ref 22–32)
Calcium: 8.4 mg/dL — ABNORMAL LOW (ref 8.9–10.3)
Chloride: 97 mmol/L — ABNORMAL LOW (ref 98–111)
Creatinine, Ser: 0.73 mg/dL (ref 0.61–1.24)
GFR calc Af Amer: >60 mL/min (ref >60)
GFR calc non Af Amer: >60 mL/min (ref >60)
Glucose, Bld: 123 mg/dL — ABNORMAL HIGH (ref 70–99)
Potassium: 3.8 mmol/L (ref 3.5–5.1)
Sodium: 136 mmol/L (ref 135–145)

## 2019-07-27 LAB — GLUCOSE, CAPILLARY
Glucose-Capillary: 104 mg/dL — ABNORMAL HIGH (ref 70–99)
Glucose-Capillary: 101 mg/dL — ABNORMAL HIGH (ref 70–99)
Glucose-Capillary: 125 mg/dL — ABNORMAL HIGH (ref 70–99)
Glucose-Capillary: 94 mg/dL (ref 70–99)
Glucose-Capillary: 128 mg/dL — ABNORMAL HIGH (ref 70–99)
Glucose-Capillary: 121 mg/dL — ABNORMAL HIGH (ref 70–99)

## 2019-07-27 LAB — MAGNESIUM: Magnesium: 1.8 mg/dL (ref 1.7–2.4)

## 2019-07-27 MED ORDER — WARFARIN - PHYSICIAN DOSING INPATIENT: Freq: Every day | Status: DC

## 2019-07-27 MED ORDER — AMIODARONE IV BOLUS ONLY 150 MG/100ML
150.0000 mg | Freq: Once | INTRAVENOUS | Status: AC
  Administered 2019-07-27: 150 mg via INTRAVENOUS

## 2019-07-27 MED ORDER — ASPIRIN EC 81 MG PO TBEC
81.0000 mg | DELAYED_RELEASE_TABLET | Freq: Every day | ORAL | Status: DC
  Administered 2019-07-27 – 2019-08-01 (×6): 81 mg via ORAL
  Filled 2019-07-27 (×6): qty 1

## 2019-07-27 MED ORDER — FUROSEMIDE 10 MG/ML IJ SOLN
20.0000 mg | Freq: Two times a day (BID) | INTRAMUSCULAR | Status: DC
  Administered 2019-07-27: 20 mg via INTRAVENOUS
  Filled 2019-07-27: qty 2

## 2019-07-27 MED ORDER — MAGNESIUM SULFATE 4 GM/100ML IV SOLN
4.0000 g | Freq: Once | INTRAVENOUS | Status: AC
  Administered 2019-07-27: 4 g via INTRAVENOUS
  Filled 2019-07-27: qty 100

## 2019-07-27 MED ORDER — POTASSIUM CHLORIDE 10 MEQ/100ML IV SOLN
10.0000 meq | INTRAVENOUS | Status: AC
  Administered 2019-07-27 (×6): 10 meq via INTRAVENOUS
  Filled 2019-07-27 (×6): qty 100

## 2019-07-27 MED ORDER — WARFARIN SODIUM 2.5 MG PO TABS
2.5000 mg | ORAL_TABLET | Freq: Every day | ORAL | Status: DC
  Administered 2019-07-27 – 2019-07-31 (×3): 2.5 mg via ORAL
  Filled 2019-07-27 (×4): qty 1

## 2019-07-27 NOTE — Discharge Instructions (Addendum)
Supplemental Discharge Instructions for  Pacemaker/Defibrillator Patients  Activity No heavy lifting or vigorous activity with your left/right arm for 6 to 8 weeks.  Do not raise your left/right arm above your head for one week.  Gradually raise your affected arm as drawn below.             08/02/2019                 08/03/2019                08/04/2019               08/05/2019 __  NO DRIVING until cleared to by surgeon  [place] the wound area clean and dry.  Do not get this area wet, no showers until cleared to at your pacemaker wound check visit - The tape/steri-strips on your wound will fall off; do not pull them off.  No bandage is needed on the site.  DO  NOT apply any creams, oils, or ointments to the wound area. - If you notice any drainage or discharge from the wound, any swelling or bruising at the site, or you develop a fever > 101? F after you are discharged home, call the office at once.  Special Instructions - You are still able to use cellular telephones; use the ear opposite the side where you have your pacemaker/defibrillator.  Avoid carrying your cellular phone near your device. - When traveling through airports, show security personnel your identification card to avoid being screened in the metal detectors.  Ask the security personnel to use the hand wand. - Avoid arc welding equipment, MRI testing (magnetic resonance imaging), TENS units (transcutaneous nerve stimulators).  Call the office for questions about other devices. - Avoid electrical appliances that are in poor condition or are not properly grounded. - Microwave ovens are safe to be near or to operate.     Information on my medicine - Coumadin   (Warfarin)  Why was Coumadin prescribed for you? Coumadin was prescribed for you because you have a blood clot or a medical condition that can cause an increased risk of forming blood clots. Blood clots can cause serious health problems by blocking the  flow of blood to the heart, lung, or brain. Coumadin can prevent harmful blood clots from forming. As a reminder your indication for Coumadin is:   Blood Clot Prevention After Heart Valve Surgery  What test will check on my response to Coumadin? While on Coumadin (warfarin) you will need to have an INR test regularly to ensure that your dose is keeping you in the desired range. The INR (international normalized ratio) number is calculated from the result of the laboratory test called prothrombin time (PT).  If an INR APPOINTMENT HAS NOT ALREADY BEEN MADE FOR YOU please schedule an appointment to have this lab work done by your health care provider within 7 days. Your INR goal is usually a number between:  2 to 3 or your provider may give you a more narrow range like 2-2.5.  Ask your health care provider during an office visit what your goal INR is.  What  do you need to  know  About  COUMADIN? Take Coumadin (warfarin) exactly as prescribed by your healthcare provider about the same time each day.  DO NOT stop taking without talking to the doctor who prescribed the medication.  Stopping without other blood clot prevention medication to take the place of Coumadin may  increase your risk of developing a new clot or stroke.  Get refills before you run out.  What do you do if you miss a dose? If you miss a dose, take it as soon as you remember on the same day then continue your regularly scheduled regimen the next day.  Do not take two doses of Coumadin at the same time.  Important Safety Information A possible side effect of Coumadin (Warfarin) is an increased risk of bleeding. You should call your healthcare provider right away if you experience any of the following: ? Bleeding from an injury or your nose that does not stop. ? Unusual colored urine (red or dark brown) or unusual colored stools (red or black). ? Unusual bruising for unknown reasons. ? A serious fall or if you hit your head (even if  there is no bleeding).  Some foods or medicines interact with Coumadin (warfarin) and might alter your response to warfarin. To help avoid this: ? Eat a balanced diet, maintaining a consistent amount of Vitamin K. ? Notify your provider about major diet changes you plan to make. ? Avoid alcohol or limit your intake to 1 drink for women and 2 drinks for men per day. (1 drink is 5 oz. wine, 12 oz. beer, or 1.5 oz. liquor.)  Make sure that ANY health care provider who prescribes medication for you knows that you are taking Coumadin (warfarin).  Also make sure the healthcare provider who is monitoring your Coumadin knows when you have started a new medication including herbals and non-prescription products.  Coumadin (Warfarin)  Major Drug Interactions  Increased Warfarin Effect Decreased Warfarin Effect  Alcohol (large quantities) Antibiotics (esp. Septra/Bactrim, Flagyl, Cipro) Amiodarone (Cordarone) Aspirin (ASA) Cimetidine (Tagamet) Megestrol (Megace) NSAIDs (ibuprofen, naproxen, etc.) Piroxicam (Feldene) Propafenone (Rythmol SR) Propranolol (Inderal) Isoniazid (INH) Posaconazole (Noxafil) Barbiturates (Phenobarbital) Carbamazepine (Tegretol) Chlordiazepoxide (Librium) Cholestyramine (Questran) Griseofulvin Oral Contraceptives Rifampin Sucralfate (Carafate) Vitamin K   Coumadin (Warfarin) Major Herbal Interactions  Increased Warfarin Effect Decreased Warfarin Effect  Garlic Ginseng Ginkgo biloba Coenzyme Q10 Green tea St. John's wort    Coumadin (Warfarin) FOOD Interactions  Eat a consistent number of servings per week of foods HIGH in Vitamin K (1 serving =  cup)  Collards (cooked, or boiled & drained) Kale (cooked, or boiled & drained) Mustard greens (cooked, or boiled & drained) Parsley *serving size only =  cup Spinach (cooked, or boiled & drained) Swiss chard (cooked, or boiled & drained) Turnip greens (cooked, or boiled & drained)  Eat a consistent  number of servings per week of foods MEDIUM-HIGH in Vitamin K (1 serving = 1 cup)  Asparagus (cooked, or boiled & drained) Broccoli (cooked, boiled & drained, or raw & chopped) Brussel sprouts (cooked, or boiled & drained) *serving size only =  cup Lettuce, raw (green leaf, endive, romaine) Spinach, raw Turnip greens, raw & chopped   These websites have more information on Coumadin (warfarin):  FailFactory.se; VeganReport.com.au;  Discharge Instructions:  1. You may shower, please wash incisions daily with soap and water and keep dry.  If you wish to cover wounds with dressing you may do so but please keep clean and change daily.  No tub baths or swimming until incisions have completely healed.  If your incisions become red or develop any drainage please call our office at [phone]  2. No Driving until cleared by Owen's office and you are no longer using narcotic pain medications  3. Monitor your weight daily.. Please use the same scale  and weigh at same time... If you gain 5-10 lbs in 48 hours with associated lower extremity swelling, please contact our office at [phone]  4. Fever of 101.5 for at least 24 hours with no source, please contact our office at [phone]  5. Activity- up as tolerated, please walk at least 3 times per day.  Avoid strenuous activity, no lifting, pushing, or pulling with your arms over 8-10 lbs for a minimum of 6 weeks  6. If any questions or concerns arise, please do not hesitate to contact our office at [phone]

## 2019-07-27 NOTE — Progress Notes (Signed)
      [address]       [place] [address]             [phone]      POD # 4 AVR  Just back from walk  BP (!) 83/66 (BP Location: Left Leg)   Pulse 95   Temp 98.3 F (36.8 C) (Oral)   Resp 20   Ht 5\' 9"  (1.753 m)   Wt 77.5 kg   SpO2 96%   BMI 25.23 kg/m   Intake/Output Summary (Last 24 hours) at 07/27/2019 1854 Last data filed at 07/27/2019 1400 Gross per 24 hour  Intake 705.17 ml  Output 2030 ml  Net -1324.83 ml   Continue current Rx  Nikala Walsworth C. Roxan Hockey, MD [facility] [phone]

## 2019-07-27 NOTE — Progress Notes (Addendum)
TCTS DAILY ICU PROGRESS NOTE                   [address]            [place] [address]          [phone]   4 Days Post-Op Procedure(s) (LRB): MINIMALLY INVASIVE AORTIC VALVE REPLACEMENT (AVR) using Edwards INSPIRIS Resilia 21 MM Aortic Valve. (N/A) TRANSESOPHAGEAL ECHOCARDIOGRAM (TEE) (N/A)  Total Length of Stay:  LOS: 4 days   Subjective: Up in the bedside chair, walked in the hall this morning.  Says he feels poorly in general since developing atrial fibrillation. Denies pain or shortness of breath.   ?Wt increase 3kg from yesterday despite net negative I&O.   Objective: Vital signs in last 24 hours: Temp:  [98.2 F (36.8 C)-99 F (37.2 C)] 99 F (37.2 C) (05/04 0730) Pulse Rate:  [67-122] 122 (05/04 0700) Cardiac Rhythm: Atrial fibrillation (05/04 0758) Resp:  [13-31] 30 (05/04 0800) BP: (93-144)/(60-89) 123/77 (05/04 0800) SpO2:  [93 %-98 %] 97 % (05/04 0800) Weight:  [77.5 kg] 77.5 kg (05/04 0500)  Filed Weights   07/25/19 0600 07/26/19 0500 07/27/19 0500  Weight: 78.2 kg 74.8 kg 77.5 kg    Weight change: 2.7 kg     Intake/Output from previous day: 05/03 0701 - 05/04 0700 In: 701 [P.O.:120; I.V.:536.4; IV Piggyback:44.5] Out: 1350 [Urine:1250; Chest Tube:100]  Intake/Output this shift: Total I/O In: -  Out: 300 [Urine:300]  Current Meds: Scheduled Meds: . acetaminophen  1,000 mg Oral Q6H  . aspirin EC  81 mg Oral Daily  . bisacodyl  10 mg Oral Daily   Or  . bisacodyl  10 mg Rectal Daily  . chlorhexidine gluconate (MEDLINE KIT)  15 mL Mouth Rinse BID  . Chlorhexidine Gluconate Cloth  6 each Topical Daily  . docusate sodium  200 mg Oral Daily  . enoxaparin (LOVENOX) injection  30 mg Subcutaneous QHS  . furosemide  20 mg Intravenous BID  . pantoprazole  40 mg Oral Daily  . pravastatin  40 mg Oral Daily  . sodium chloride flush  3 mL Intravenous Q12H  . tamsulosin  0.4 mg Oral Daily  . warfarin  2.5 mg Oral q1600  . Warfarin  - Physician Dosing Inpatient   Does not apply q1600   Continuous Infusions: . sodium chloride    . amiodarone 30 mg/hr (07/27/19 0916)  . lactated ringers    . lactated ringers 10 mL/hr at 07/25/19 0000  . magnesium sulfate bolus IVPB 4 g (07/27/19 0907)  . potassium chloride 10 mEq (07/27/19 0857)   PRN Meds:.lip balm, metoprolol tartrate, ondansetron (ZOFRAN) IV, oxyCODONE, sodium chloride flush, traMADol  Physical Exam: General appearance: alert, cooperative and mild distress Neurologic: intact Heart: irregularly irregular rhythm, VR 100-110. Expected soft systolic murmur. Lungs: Breath sonuds clear, good sats on RA.  Extremities: Trace LE edema Wound: the right chest dressing is well approximated and dry.   Lab Results: CBC: Recent Labs    07/26/19 0421 07/27/19 0604  WBC 12.1* 7.1  HGB 13.8 13.4  HCT 41.6 39.7  PLT 75* 89*   BMET:  Recent Labs    07/26/19 0421 07/27/19 0604  NA 135 136  K 4.3 3.8  CL 102 97*  CO2 26 28  GLUCOSE 118* 123*  BUN 23 21  CREATININE 0.71 0.73  CALCIUM 8.5* 8.4*    CMET: Lab Results  Component Value Date   WBC 7.1 07/27/2019  HGB 13.4 07/27/2019   HCT 39.7 07/27/2019   PLT 89 (L) 07/27/2019   GLUCOSE 123 (H) 07/27/2019   ALT 26 07/19/2019   AST 29 07/19/2019   NA 136 07/27/2019   K 3.8 07/27/2019   CL 97 (L) 07/27/2019   CREATININE 0.73 07/27/2019   BUN 21 07/27/2019   CO2 28 07/27/2019   INR 1.4 (H) 07/23/2019   HGBA1C 5.7 (H) 07/19/2019      PT/INR: No results for input(s): LABPROT, INR in the last 72 hours. Radiology: [facility] Chest Port 1 View  Result Date: 07/27/2019 CLINICAL DATA:  Shortness of breath. EXAM: PORTABLE CHEST 1 VIEW COMPARISON:  Jul 25, 2019. FINDINGS: Stable cardiomediastinal silhouette. Right-sided chest tube has been removed. Minimal right apical pneumothorax is noted. Mild bibasilar subsegmental atelectasis is noted with minimal bilateral pleural effusions. Bony thorax is unremarkable. IMPRESSION:  Right-sided chest tube has been removed. Minimal right apical pneumothorax is noted. Mild bibasilar subsegmental atelectasis is noted with minimal bilateral pleural effusions. Electronically Signed   By: Marijo Conception M.D.   On: 07/27/2019 08:52     Assessment/Plan: S/P Procedure(s) (LRB): MINIMALLY INVASIVE AORTIC VALVE REPLACEMENT (AVR) using Edwards INSPIRIS Resilia 21 MM Aortic Valve. (N/A) TRANSESOPHAGEAL ECHOCARDIOGRAM (TEE) (N/A)  -POD-4 MINI aortic valve replacement for severe AS with bicuspid aortic valve.  BP stable. Removing chest tubes today. Advance activity as tolerated.  -Post-op atrial fibrillation- rate control is reasonable and SBP is 120-154mHg. IV amiodarone load initiated yesterday AM. Repeat IV bolus today. K+ 3.8, Mg++ 1.8 (replacement KCL and MgSO4 ordered).  Begin anticoagulation with Coumadin.   -BPH-required I&O cath x 1 yesterday, Flomax added.  Voided x 2 yesterday.  Volume Excess- question accuracy of Wt's, does not appear obviously volume overloaded. Will continue IV Lasix today.   -DVT PPX- Continue Lovenox   MAntony Odea PA-C [phone]/06/2019 9:55 AM    I have seen and examined the patient and agree with the assessment and plan as outlined.  CRexene Alberts MD 07/27/2019

## 2019-07-27 NOTE — Hospital Course (Addendum)
Patient underwent uncomplicated aortic valve replacement using a 21 mm Edwards Inspiris Resilia stented bovine pericardial tissue valve via right anterior minithoracotomy on July 23, 2019.  The patient's early postoperative recovery was notable for the development of persistent atrial fibrillation on the second postoperative day.  He remained in atrial fibrillation for several days but converted back to sinus rhythm on IV amiodarone.  Coumadin was started.  The amiodarone was converted to oral medication. He was transferred to 4E [facility] on the 5th post-op day. During the afternoon of POD-5 he had a syncopal episode while in the bathroom attempting to have a bowel movement. He apparently fell forward from the commode and was found by his nurse in a kneeling position with his face against the floor. He had a laceration that coursed along the lower edge of his left eye brow.  A rapid response was called.  The patient was found to be awake with appropriate mental status and neurologically intact. Bleeding was controlled with local pressure to the laceration. The patient was returned to his bed using a mechanical lift with strict care being given to immobilization of his neck during the process. He was in SR at this time but review of the cardiac monitor showed had had an episode of complete heart block near the time of his syncopal episode. A CT of the brain was negative for acute event. A CT of the neck demonstrated a small anterior inferior chip fracture off the vertebral body of C6. Neurosurgery was consulted continued stabilization of the neck with a soft cervical collar was recommended and ordered. Plastic surgery was consulted and closure of the laceration was accomplished at the bedside with local anesthesia. The cardiology EP service was consulted to assist in evaluating his cardiac rhythm management and to assess the need for a permanent pacemaker.  Later in the evening on post-op day 5, he  developed atrial flutter that was treated with additional amiodarone and IV metoprolol.  Following this, he had an episode of ventricular standstill observed by his nurse who initiated CPR.  He had return of consciousness within 1 minute of initiating CPR and was again in atrial fibrillation. He was evaluated by Dr. Orvan Seen at the bedside and the decision was made to transfer Mr. Francavilla back to the ICU. He remained stable. He was evaluated by the EP service and arrangements were made for placement of a permanent pacemaker on POD-6.  Following the procedure, the amiodarone and metoprolol were resumed and managed by the EP service. He was started back on coumadin. He wa transferred back to 4E [facility].

## 2019-07-27 NOTE — Progress Notes (Signed)
[id number] Came to walk with pt but HR at 130s. Pt still not feeling well. Walked this morning at 0500. Gave pt IS and he demonstrated about 1300 ml 5x. Encouraged 10x an hour since not walking. Briefly discussed CRP 2. Will readdress closer to discharge. Emotional support given as pt was very active prior to surgery and he is not feeling well now. Graylon Good RN BSN 07/27/2019 3:17 PM

## 2019-07-28 ENCOUNTER — Inpatient Hospital Stay (HOSPITAL_COMMUNITY): Payer: Medicare Other

## 2019-07-28 DIAGNOSIS — I442 Atrioventricular block, complete: Secondary | ICD-10-CM

## 2019-07-28 HISTORY — DX: Atrioventricular block, complete: I44.2

## 2019-07-28 LAB — BASIC METABOLIC PANEL
Anion gap: 10 (ref 5–15)
Anion gap: 11 (ref 5–15)
BUN: 15 mg/dL (ref 8–23)
BUN: 14 mg/dL (ref 8–23)
CO2: 25 mmol/L (ref 22–32)
CO2: 24 mmol/L (ref 22–32)
Calcium: 8.2 mg/dL — ABNORMAL LOW (ref 8.9–10.3)
Calcium: 8.4 mg/dL — ABNORMAL LOW (ref 8.9–10.3)
Chloride: 100 mmol/L (ref 98–111)
Chloride: 101 mmol/L (ref 98–111)
Creatinine, Ser: 0.63 mg/dL (ref 0.61–1.24)
Creatinine, Ser: 0.72 mg/dL (ref 0.61–1.24)
GFR calc Af Amer: >60 mL/min (ref >60)
GFR calc Af Amer: >60 mL/min (ref >60)
GFR calc non Af Amer: >60 mL/min (ref >60)
GFR calc non Af Amer: >60 mL/min (ref >60)
Glucose, Bld: 120 mg/dL — ABNORMAL HIGH (ref 70–99)
Glucose, Bld: 130 mg/dL — ABNORMAL HIGH (ref 70–99)
Potassium: 3.7 mmol/L (ref 3.5–5.1)
Potassium: 4 mmol/L (ref 3.5–5.1)
Sodium: 135 mmol/L (ref 135–145)
Sodium: 136 mmol/L (ref 135–145)

## 2019-07-28 LAB — GLUCOSE, CAPILLARY
Glucose-Capillary: 122 mg/dL — ABNORMAL HIGH (ref 70–99)
Glucose-Capillary: 123 mg/dL — ABNORMAL HIGH (ref 70–99)

## 2019-07-28 LAB — MAGNESIUM
Magnesium: 1.8 mg/dL (ref 1.7–2.4)
Magnesium: 1.8 mg/dL (ref 1.7–2.4)

## 2019-07-28 LAB — PROTIME-INR
INR: 1.1 (ref 0.8–1.2)
Prothrombin Time: 13.6 seconds (ref 11.4–15.2)

## 2019-07-28 MED ORDER — LIDOCAINE-EPINEPHRINE 1 %-1:100000 IJ SOLN
20.0000 mL | INTRAMUSCULAR | Status: DC
  Filled 2019-07-28: qty 20

## 2019-07-28 MED ORDER — AMIODARONE HCL 200 MG PO TABS: 200.0000 mg | ORAL_TABLET | Freq: Two times a day (BID) | ORAL | Status: DC

## 2019-07-28 MED ORDER — AMIODARONE HCL 200 MG PO TABS: 200.0000 mg | ORAL_TABLET | Freq: Every day | ORAL | Status: DC

## 2019-07-28 MED ORDER — POTASSIUM CHLORIDE CRYS ER 20 MEQ PO TBCR
40.0000 meq | EXTENDED_RELEASE_TABLET | Freq: Once | ORAL | Status: AC
  Administered 2019-07-28: 40 meq via ORAL
  Filled 2019-07-28: qty 2

## 2019-07-28 MED ORDER — LIDOCAINE HCL (PF) 2 % IJ SOLN
0.0000 mL | Freq: Once | INTRAMUSCULAR | Status: DC | PRN
  Filled 2019-07-28: qty 20

## 2019-07-28 MED ORDER — POTASSIUM CHLORIDE CRYS ER 20 MEQ PO TBCR: 20.0000 meq | EXTENDED_RELEASE_TABLET | ORAL | Status: DC

## 2019-07-28 MED ORDER — LIDOCAINE HCL (PF) 1 % IJ SOLN
INTRAMUSCULAR | Status: AC
  Filled 2019-07-28: qty 5

## 2019-07-28 MED ORDER — AMIODARONE IV BOLUS ONLY 150 MG/100ML
150.0000 mg | Freq: Once | INTRAVENOUS | Status: AC
  Administered 2019-07-28: 150 mg via INTRAVENOUS
  Filled 2019-07-28: qty 100

## 2019-07-28 MED ORDER — PENTAFLUOROPROP-TETRAFLUOROETH EX AERO
INHALATION_SPRAY | Freq: Once | CUTANEOUS | Status: DC
  Filled 2019-07-28: qty 116

## 2019-07-28 MED ORDER — FUROSEMIDE 10 MG/ML IJ SOLN
20.0000 mg | Freq: Once | INTRAMUSCULAR | Status: AC
  Administered 2019-07-28: 20 mg via INTRAVENOUS
  Filled 2019-07-28: qty 2

## 2019-07-28 MED ORDER — MAGNESIUM SULFATE 4 GM/100ML IV SOLN
4.0000 g | Freq: Once | INTRAVENOUS | Status: AC
  Administered 2019-07-28: 4 g via INTRAVENOUS
  Filled 2019-07-28: qty 100

## 2019-07-28 MED ORDER — POTASSIUM CHLORIDE 10 MEQ/100ML IV SOLN
10.0000 meq | INTRAVENOUS | Status: AC
  Administered 2019-07-28 (×5): 10 meq via INTRAVENOUS
  Filled 2019-07-28 (×5): qty 100

## 2019-07-28 MED ORDER — AMIODARONE HCL 200 MG PO TABS
200.0000 mg | ORAL_TABLET | Freq: Two times a day (BID) | ORAL | Status: DC
  Administered 2019-07-28: 200 mg via ORAL
  Filled 2019-07-28: qty 1

## 2019-07-28 NOTE — Progress Notes (Signed)
CARDIAC REHAB PHASE I   PRE:  Rate/Rhythm: 74 SR  BP:  Supine: 136/71  Sitting:   Standing:    SaO2: 96%RA  MODE:  Ambulation: 290 ft   POST:  Rate/Rhythm: 78 SR  BP:  Supine:   Sitting: 128/81  Standing:    SaO2: 95%RA 0934-1002 Pt walked 290 ft on RA with rolling walker and minimal asst. Gait steady. C/o lightheadedness so cut walk short. Second walk for today. To wheelchair for transfer. Vital signs stable.    Graylon Good, RN BSN  07/28/2019 9:59 AM

## 2019-07-28 NOTE — Significant Event (Addendum)
Event Note   Overview: Pt having symptomatic pauses, brief arrest requiring CPR, pacer not capturing  Initial Focused Assessment: Pt alert and oriented, in atrial flutter on monitor. Epicardial pacer set to VVI @ 60bpm with 33mA and sensitivity 2.0 mV. During pause, pt visibly uncomfortable "feels terrible". Per primary RN, pt suffered a fall d/t LOC d/t these pauses when pt went into Afib earlier today. Pt is post op mini AVR. Had received 150mg  bolus amio and 5mg  IV Lopressor this evening d/t Afib. Not on any other rhythm-affecting meds on initial review.  Event: Pacer spikes visible, but no capture. Increased mA to 22, got electric and mechanical capture. Set output to 61mA to ensure capture. Observed rhythm and pt response for 10 minutes, no issues, pt felt more comfortable.  After exiting the room, pt went into ventricular standstill (pacing spikes present, but no capture). I immediately went in the room, saw pt was asystolic,unresponsive, and ashen, and began CPR. Within <33min of compressions, pt's color returned and he moved his arms and verbalized "ow". Pulse confirmed, pt back in previous Afib rhythm with intermittent V-pace capture. Zoll pads applied as backup, Dr. Orvan Seen notified, ICU transfer orders placed. BMet + Mag labs ordered.  Dr. Orvan Seen came to bedside and modified pacer, re-setting the pins in the the connectors -- now set to DDD rate 92bpm, atrial output 54mA, vent output 51mA, atrial sensitivity 1.64mV, vent sensitivity 3.73mV. Pt tolerating well, pacer capturing successfully, mechanical capture confirmed.  Transferred pt without event to 2H02 with Rapid Response RN Shanon Brow and SWOT RN Chester Holstein and primary 4E RN Early Osmond.   Interventions: pacer troubleshooting, applied zoll pads, obtained 12-lead, transferred pt to [facility] ICU.   Event Summary: Arrived at [facility] (pacer not capturing), CPR initiated at 2218, ROSC at 2219.  Transferred pt to 2H02 at 2255.  Henreitta Leber,  RN 2-Heart

## 2019-07-28 NOTE — Progress Notes (Signed)
Preoperative Dx: Left eyebrow laceration s/p fall Postoperative Dx: Same  Procedure: Complex closure left eye brow laceration 5cm  Surgeon: Mingo Amber, MD Assistant: Verdie Shire, PA The advanced practice practitioner (APP) assisted throughout the case.  The APP was essential in retraction and counter traction when needed to make the case progress smoothly.  This retraction and assistance made it possible to see the tissue plans for the procedure.  The assistance was needed for blood control, tissue re-approximation and assisted with closure of the incision site.  Anesthesia: Lidocaine 1% with 1:100,000 epinepherine  Indication for Procedure: Left eyebrow laceration  Description of Procedure: Risks and complications were explained to the patient. Time out was called and all information was confirmed to be correct.  5 cc of Lidocaine 1% with epinepherine was injected in the subcutaneous area of the left eyebrow. After waiting several minutes for the lidocaine to take affect 20 cc of saline was used to irrigate the wound.  A 5-0 Monocryl was used to close the deep muscle and subcutaneous layers with simple interrupted stitches.  The skin edges were undermined and advanced and reapproximated with 5-0 Monocryl running closure.  The patient is to follow up in one week.  He tolerated the procedure well and there were no complications.  Would recommend BID application of ophthalmic bacitracin to the eye brow incision.

## 2019-07-28 NOTE — Progress Notes (Signed)
Orthopedic Tech Progress Note Patient Details:  Jerry Foster April 17, 1950 [id number]  Ortho Devices Type of Ortho Device: Soft collar Ortho Device/Splint Location: Neck Ortho Device/Splint Interventions: Ordered   Post Interventions Patient Tolerated: Unable to use device properly   Maxemiliano Riel E Jyl Chico 07/28/2019, 6:15 PM

## 2019-07-28 NOTE — Significant Event (Signed)
Rapid Response Event Note  Overview: Syncope - Bleeding from laceration  Initial Focused Assessment: Called urgently to bedside for a patient who had a syncopal episode while in the bathroom. Upon arrival, patient's eye glasses were embedded in his scalp around his eyebrow - there was a good bit of bleeding initially. Patient was awake, able to talk, move to move his legs and hands, denied chest/neck pain. No visual deficits.SBP were 70-80s but he was also laying on his side partially. HR 70s mostly -- re-attached epicardial pacing wires to VVI 60 - backup. No respiratory issues. CVTS PAs came to the bedside, they were able to carefully free the glasses and attend to the laceration. Afterwards we lifted the patient back to bed. BP was normal now.  Interventions: -- NO RRT INTERVENTIONS  Plan of Care: -- Patient to have CT HEAD and C-SPINE -- Plastic surgery to address further management of laceration  Event Summary:  Call Time 1402 Arrival Time 1404 End Time [place], [place]
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
## 2019-07-28 NOTE — Progress Notes (Addendum)
TCTS DAILY ICU PROGRESS NOTE                   [address]            [place] [address]          [phone]   5 Days Post-Op Procedure(s) (LRB): MINIMALLY INVASIVE AORTIC VALVE REPLACEMENT (AVR) using Edwards INSPIRIS Resilia 21 MM Aortic Valve. (N/A) TRANSESOPHAGEAL ECHOCARDIOGRAM (TEE) (N/A)  Total Length of Stay:  LOS: 5 days   Subjective: Up in the bedside chair, rested better last night.  AV paced this AM after converting from a-fib to SR with 2nd degree tpe AV block last night.  Amiodarone infusing.  Objective: Vital signs in last 24 hours: Temp:  [98.3 F (36.8 C)-99.4 F (37.4 C)] 99.3 F (37.4 C) (05/05 0721) Pulse Rate:  [44-123] 79 (05/05 0700) Cardiac Rhythm: Atrial fibrillation (05/05 0000) Resp:  [16-38] 22 (05/05 0700) BP: (83-141)/(62-101) 132/101 (05/05 0700) SpO2:  [95 %-99 %] 96 % (05/05 0700) Weight:  [75.2 kg] 75.2 kg (05/05 0600)  Filed Weights   07/26/19 0500 07/27/19 0500 07/28/19 0600  Weight: 74.8 kg 77.5 kg 75.2 kg    Weight change: -2.3 kg     Intake/Output from previous day: 05/04 0701 - 05/05 0700 In: 802.1 [P.O.:120; I.V.:538.8; IV Piggyback:143.2] Out: 2855 [Urine:2855]  Intake/Output this shift: No intake/output data recorded.  Current Meds: Scheduled Meds: . acetaminophen  1,000 mg Oral Q6H  . aspirin EC  81 mg Oral Daily  . bisacodyl  10 mg Oral Daily   Or  . bisacodyl  10 mg Rectal Daily  . chlorhexidine gluconate (MEDLINE KIT)  15 mL Mouth Rinse BID  . Chlorhexidine Gluconate Cloth  6 each Topical Daily  . docusate sodium  200 mg Oral Daily  . enoxaparin (LOVENOX) injection  30 mg Subcutaneous QHS  . furosemide  20 mg Intravenous Once  . pantoprazole  40 mg Oral Daily  . potassium chloride  40 mEq Oral Once  . pravastatin  40 mg Oral Daily  . sodium chloride flush  3 mL Intravenous Q12H  . tamsulosin  0.4 mg Oral Daily  . warfarin  2.5 mg Oral q1600  . Warfarin - Physician Dosing Inpatient    Does not apply q1600   Continuous Infusions: . sodium chloride    . amiodarone 30 mg/hr (07/28/19 0600)  . lactated ringers    . lactated ringers 10 mL/hr at 07/25/19 0000  . magnesium sulfate bolus IVPB    . potassium chloride     PRN Meds:.lip balm, metoprolol tartrate, ondansetron (ZOFRAN) IV, oxyCODONE, sodium chloride flush, traMADol  Physical Exam: General appearance:alert, cooperative and mild distress Neurologic:intact Heart:RRR, monitor shows NS at 76/min.  Pacer turned off but is still connected.  Lungs:Breath sonuds clear, good sats on RA. Extremities:Trace LE edema Wound:the right chest dressing is well approximated and dry.  Lab Results: CBC: Recent Labs    07/26/19 0421 07/27/19 0604  WBC 12.1* 7.1  HGB 13.8 13.4  HCT 41.6 39.7  PLT 75* 89*   BMET:  Recent Labs    07/27/19 0604 07/28/19 0332  NA 136 135  K 3.8 3.7  CL 97* 100  CO2 28 25  GLUCOSE 123* 120*  BUN 21 15  CREATININE 0.73 0.63  CALCIUM 8.4* 8.2*    CMET: Lab Results  Component Value Date   WBC 7.1 07/27/2019   HGB 13.4 07/27/2019   HCT 39.7 07/27/2019   PLT  89 (L) 07/27/2019   GLUCOSE 120 (H) 07/28/2019   ALT 26 07/19/2019   AST 29 07/19/2019   NA 135 07/28/2019   K 3.7 07/28/2019   CL 100 07/28/2019   CREATININE 0.63 07/28/2019   BUN 15 07/28/2019   CO2 25 07/28/2019   INR 1.1 07/28/2019   HGBA1C 5.7 (H) 07/19/2019      PT/INR:  Recent Labs    07/28/19 0332  LABPROT 13.6  INR 1.1   Radiology: No results found.   Assessment/Plan: S/P Procedure(s) (LRB): MINIMALLY INVASIVE AORTIC VALVE REPLACEMENT (AVR) using Edwards INSPIRIS Resilia 21 MM Aortic Valve. (N/A) TRANSESOPHAGEAL ECHOCARDIOGRAM (TEE) (N/A)  -POD-5 MINI aortic valve replacement for severe AS with bicuspid aortic valve. BP stable.  Did not walk much yesterday due to feeling weak and tired. Will advance activity today. Plan transfer to [facility].  -Post-op atrial fibrillation-  converted to second degree AVB last night with VR 40's-50 so AV pacing was resumed. He is in NSR this AM at 70-80/min. Will convert to oral amiodarone.  Continue anticoagulation with Coumadin.  INR 1.1 after 2.59m x 1 dose.   -BPH with post-op urinary retention- required I&O cath x 1,,now voiding with no difficulty.    -Volume Excess- question accuracy of Wt's but still having good response to Lasix and BUN only 15, continue diuresis.   -Thrombocytopenia- Plt count recovering  -DVT PPX- Continue Lovenox until INR therapeutic.      MAntony Odea PA-C [phone] [id number]/07/2019 7:37 AM   I have seen and examined the patient and agree with the assessment and plan as outlined.  CRexene Alberts MD 07/28/2019 8:01 AM

## 2019-07-28 NOTE — Progress Notes (Signed)
TCTS Evening Rounds  The patient experienced another bout of atrial fib this pm treated with beta-blocker which was an existing prn order. He then had long ventricular pause prompting brief CPR. 2-H ICU called to assist with temp pacer. Presently, he is in/out of atrial fib with occasional long pauses. He has not gotten amiodarone bolus tonight.  He is successfully A-V pacing when not in rapid atrial fibrillation.  Transferring to 2-H for closer monitoring. Hemodynamically stable and perfuses the afib beats as well as paced beats.  Vernia Teem Z. Orvan Seen, [place]

## 2019-07-28 NOTE — Progress Notes (Signed)
Patient central line removed as ordered. Pressure held and dressing applied. Will monitor patient.Jerry Foster, Bettina Gavia rN

## 2019-07-28 NOTE — Significant Event (Signed)
Rapid Response Event Note  Overview: Afib with occasional ventricular pauses/failure to capture  Initial Focused Assessment: I was notified by the nursing staff of brief CPR event and they requested assistance. Upon my arrival, Jerry Foster was arousable, oriented to person, place and situation and stated he did not "feel good". Skin is pink, warm and diaphoretic. Multiple RNs at the bedside including Jerry Foster from [facility] trouble shooting his temporary pacer with epicardial wires. Jerry Foster was previously notified and on his way to see patient. Jerry Foster was in Afib with brief episodes of ventricular standstill despite pacing. Jerry Foster increased the mA to 25 and manipulated the wires and connection to optimize pacing. Pt placed on external pacer as backup until Jerry Foster arrived. Jerry Foster arrived at bedside and manipulated the connection to improve his AV pacing. Pt was transferred to 2H02 via bed with temp pacer, cardiac monitoring and other medical devices without complication. Pt had brief episode of nausea on arrival to [facility].  Reportedly, prior to event, pt went into afib and was treated initially with Amio 150mg  and then later Lopressor 5mg .  2300- 98.4 F, HR 92 av paced, 137/75 (86), RR 19 and sats 96% on RA.    Interventions: -New PIV -Tx to [place] -Stat BMET, Mg   Event Summary: Call received 2225 Arrived 2228 Call ended 2325  Jerry Foster

## 2019-07-28 NOTE — Progress Notes (Signed)
Pt in a.fib rvr again HR 110-40s. BP 134/72. EKG completed. Paged Dr. Orvan Seen. Verbal orders for amio 150 mg bolus.

## 2019-07-28 NOTE — Consult Note (Signed)
[facility]  Reason for Consult: Left eyebrow laceration after a fall Referring Physician: Darylene Price, MD  Jerry Foster is an 69 y.o. male.  HPI: Patient is a 68 year old male who presented to [facility] for minimally invasive aortic valve replacement with Dr. Roxy Manns on 07/23/2019.  Today he was using the bathroom and upon standing passed out and this resulted in a left eyebrow laceration and a laceration below his left eye.  Prior to plastic surgery consultation, patient's lacerations were irrigated, local anesthetic was applied and patient's glasses were removed from where they were stuck to his skin flaps of his upper left eyebrow laceration.  Small laceration below the left eye was closed with Dermabond by Dr. Roxy Manns.  Upon evaluation today patient reports that he is feeling fine.  We were consulted to repair left eyebrow laceration.   Past Medical History:  Diagnosis Date  . Aortic stenosis   . Benign prostatic hyperplasia   . Bronchitis   . Cataract    bilateral   . CHF (congestive heart failure) ([place])   . Colon polyps   . Heart murmur   . History of fracture of vertebral column   . Hyperlipidemia   . Kidney stone   . Nonrheumatic aortic valve stenosis   . Osteoporosis   . Osteoporosis   . Pure hypercholesterolemia   . RBBB   . 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 Sensorineural hearing loss of both ears   . Thrombosed hemorrhoids    hsitory of  . Tubular adenoma of colon   . Vitamin D deficiency     Past Surgical History:  Procedure Laterality Date  . AORTIC VALVE REPLACEMENT N/A 07/23/2019   Procedure: MINIMALLY INVASIVE AORTIC VALVE REPLACEMENT (AVR) using Margaretha Sheffield Resilia 21 MM Aortic Valve.;  Surgeon: Rexene Alberts, MD;  Location: [place];  Service: Open Heart Surgery;  Laterality:  N/A;  . COLON SURGERY  2006   1 foot ascending colon removed   . COLONOSCOPY    . HERNIA REPAIR    . POLYPECTOMY    . RIGHT HEART CATH AND CORONARY ANGIOGRAPHY N/A 06/08/2019   Procedure: RIGHT HEART CATH AND CORONARY ANGIOGRAPHY;  Surgeon: Sherren Mocha, MD;  Location: [facility];  Service: Cardiovascular;  Laterality: N/A;  . TEE WITHOUT CARDIOVERSION N/A 07/23/2019   Procedure: TRANSESOPHAGEAL ECHOCARDIOGRAM (TEE);  Surgeon: Rexene Alberts, MD;  Location: [place];  Service: Open Heart Surgery;  Laterality: N/A;  . TESTICLE SURGERY      Family History  Problem Relation Age of Onset  . Colon cancer Neg Hx   . Colon polyps Neg Hx   . Esophageal cancer Neg Hx   . Rectal cancer Neg Hx   . Stomach cancer Neg Hx     Social History:  reports that he has never smoked. He has never used smokeless tobacco. He reports current alcohol use. He reports that he does not use drugs.  Allergies:  Allergies  Allergen Reactions  . Terbinafine Hcl Other (See Comments)    Elevated LFT Lamisil     Medications: I have reviewed the patient's current medications.  Results for orders placed or performed during the hospital encounter of 07/23/19 (from the past 48 hour(s))  Glucose, capillary     Status: None   Collection Time: 07/26/19  7:45 PM  Result Value Ref Range  Glucose-Capillary 80 70 - 99 mg/dL    Comment: Glucose reference range applies only to samples taken after fasting for at least 8 hours.  Glucose, capillary     Status: Abnormal   Collection Time: 07/26/19 11:48 PM  Result Value Ref Range   Glucose-Capillary 136 (H) 70 - 99 mg/dL    Comment: Glucose reference range applies only to samples taken after fasting for at least 8 hours.  Glucose, capillary     Status: Abnormal   Collection Time: 07/27/19  4:59 AM  Result Value Ref Range   Glucose-Capillary 104 (H) 70 - 99 mg/dL    Comment: Glucose reference range applies only to samples taken after fasting for at least 8  hours.  CBC     Status: Abnormal   Collection Time: 07/27/19  6:04 AM  Result Value Ref Range   WBC 7.1 4.0 - 10.5 K/uL   RBC 4.33 4.22 - 5.81 MIL/uL   Hemoglobin 13.4 13.0 - 17.0 g/dL   HCT 39.7 39.0 - 52.0 %   MCV 91.7 80.0 - 100.0 fL   MCH 30.9 26.0 - 34.0 pg   MCHC 33.8 30.0 - 36.0 g/dL   RDW 12.8 11.5 - 15.5 %   Platelets 89 (L) 150 - 400 K/uL    Comment: CONSISTENT WITH PREVIOUS RESULT Immature Platelet Fraction may be clinically indicated, consider ordering this additional test [id number] REPEATED TO VERIFY    nRBC 0.0 0.0 - 0.2 %    Comment: Performed at [facility], [place] [address] [place], [place] [id number]  Basic metabolic panel     Status: Abnormal   Collection Time: 07/27/19  6:04 AM  Result Value Ref Range   Sodium 136 135 - 145 mmol/L   Potassium 3.8 3.5 - 5.1 mmol/L   Chloride 97 (L) 98 - 111 mmol/L   CO2 28 22 - 32 mmol/L   Glucose, Bld 123 (H) 70 - 99 mg/dL    Comment: Glucose reference range applies only to samples taken after fasting for at least 8 hours.   BUN 21 8 - 23 mg/dL   Creatinine, Ser 0.73 0.61 - 1.24 mg/dL   Calcium 8.4 (L) 8.9 - 10.3 mg/dL   GFR calc non Af Amer >60 >60 mL/min   GFR calc Af Amer >60 >60 mL/min   Anion gap 11 5 - 15    Comment: Performed at [facility] [address], [place], [place] [address]  Magnesium     Status: None   Collection Time: 07/27/19  6:04 AM  Result Value Ref Range   Magnesium 1.8 1.7 - 2.4 mg/dL    Comment: Performed at [facility] [address], [place], [place] [address]  Glucose, capillary     Status: Abnormal   Collection Time: 07/27/19  7:28 AM  Result Value Ref Range   Glucose-Capillary 101 (H) 70 - 99 mg/dL    Comment: Glucose reference range applies only to samples taken after fasting for at least 8 hours.  Glucose, capillary     Status: Abnormal   Collection Time: 07/27/19 11:16 AM  Result Value Ref Range   Glucose-Capillary 125 (H) 70 - 99 mg/dL    Comment: Glucose  reference range applies only to samples taken after fasting for at least 8 hours.  Glucose, capillary     Status: None   Collection Time: 07/27/19  3:22 PM  Result Value Ref Range   Glucose-Capillary 94 70 - 99 mg/dL  Comment: Glucose reference range applies only to samples taken after fasting for at least 8 hours.  Glucose, capillary     Status: Abnormal   Collection Time: 07/27/19  7:23 PM  Result Value Ref Range   Glucose-Capillary 128 (H) 70 - 99 mg/dL    Comment: Glucose reference range applies only to samples taken after fasting for at least 8 hours.  Glucose, capillary     Status: Abnormal   Collection Time: 07/27/19 11:10 PM  Result Value Ref Range   Glucose-Capillary 121 (H) 70 - 99 mg/dL    Comment: Glucose reference range applies only to samples taken after fasting for at least 8 hours.  Protime-INR     Status: None   Collection Time: 07/28/19  3:32 AM  Result Value Ref Range   Prothrombin Time 13.6 11.4 - 15.2 seconds   INR 1.1 0.8 - 1.2    Comment: (NOTE) INR goal varies based on device and disease states. Performed at [facility], [place] [address], [place], [place] [id number]   Basic metabolic panel     Status: Abnormal   Collection Time: 07/28/19  3:32 AM  Result Value Ref Range   Sodium 135 135 - 145 mmol/L   Potassium 3.7 3.5 - 5.1 mmol/L   Chloride 100 98 - 111 mmol/L   CO2 25 22 - 32 mmol/L   Glucose, Bld 120 (H) 70 - 99 mg/dL    Comment: Glucose reference range applies only to samples taken after fasting for at least 8 hours.   BUN 15 8 - 23 mg/dL   Creatinine, Ser 0.63 0.61 - 1.24 mg/dL   Calcium 8.2 (L) 8.9 - 10.3 mg/dL   GFR calc non Af Amer >60 >60 mL/min   GFR calc Af Amer >60 >60 mL/min   Anion gap 10 5 - 15    Comment: Performed at [facility] [address], [place], [place] [address]  Magnesium     Status: None   Collection Time: 07/28/19  3:32 AM  Result Value Ref Range   Magnesium 1.8 1.7 - 2.4 mg/dL    Comment: Performed at  [facility] [address] [place], [place] [address]    CT HEAD WO CONTRAST  Result Date: 07/28/2019 CLINICAL DATA:  [place], anticoagulated, syncope EXAM: CT HEAD WITHOUT CONTRAST TECHNIQUE: Contiguous axial images were obtained from the base of the skull through the vertex without intravenous contrast. COMPARISON:  None. FINDINGS: Brain: No acute infarct or hemorrhage. Lateral ventricles and midline structures are unremarkable. No acute extra-axial fluid collections. No mass effect. Vascular: No hyperdense vessel or unexpected calcification. Skull: No acute displaced fractures. Sclerosis and calvarial thickening are seen within the right orbital roof and right frontal bone of the calvarium, most compatible with focal Paget disease. Sinuses/Orbits: Sinuses are clear. Other: None. IMPRESSION: 1. No acute intracranial process. 2. 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 No acute bony abnormalities. Electronically Signed   By: Randa Ngo M.D.   On: 07/28/2019 16:23   CT CERVICAL SPINE WO CONTRAST  Result Date: 07/28/2019 CLINICAL DATA:  [place], loss of consciousness, syncope EXAM: CT CERVICAL SPINE WITHOUT CONTRAST TECHNIQUE: Multidetector CT imaging of the cervical spine was performed without intravenous contrast. Multiplanar CT image reconstructions were also generated. COMPARISON:  None. FINDINGS: Alignment: Alignment is grossly anatomic. Skull base and vertebrae: There is a small displaced fracture through the anterior inferior margin of the C6 vertebral body, best seen on  sagittal reconstructed images consistent with hyperextension injury. Minimal distraction at the fracture site. No other acute displaced fractures. Soft tissues and spinal canal: No prevertebral fluid or swelling. No visible canal hematoma. Disc levels: There is mild diffuse spondylosis and facet hypertrophy. Changes are most pronounced at C3/C4, C5/C6, and C6/C7. There is bilateral neural foraminal  encroachment at these levels as result of circumferential disc osteophyte complex. Changes are slightly eccentric to the left. Upper chest: Trace right apical pneumothorax is seen, reported on prior chest x-ray. Other: Reconstructed images demonstrate no additional findings. IMPRESSION: 1. Avulsion fracture anterior inferior aspect of the C6 vertebral body as above, consistent with hyperextension injury. Minimal distraction of the fracture fragment. 2. Multilevel cervical spondylosis and facet hypertrophy as above, greatest at C3-4, C5-6, and C6-7. These results were called by telephone at the time of interpretation on 07/28/2019 at 4:37 pm to provider [employer] , who verbally acknowledged these results. Electronically Signed   By: Randa Ngo M.D.   On: 07/28/2019 16:38   DG Chest Port 1 View  Result Date: 07/27/2019 CLINICAL DATA:  Shortness of breath. EXAM: PORTABLE CHEST 1 VIEW COMPARISON:  Jul 25, 2019. FINDINGS: Stable cardiomediastinal silhouette. Right-sided chest tube has been removed. Minimal right apical pneumothorax is noted. Mild bibasilar subsegmental atelectasis is noted with minimal bilateral pleural effusions. Bony thorax is unremarkable. IMPRESSION: Right-sided chest tube has been removed. Minimal right apical pneumothorax is noted. Mild bibasilar subsegmental atelectasis is noted with minimal bilateral pleural effusions. Electronically Signed   By: Marijo Conception M.D.   On: 07/27/2019 08:52   Review of Systems  Constitutional: Negative.    Blood pressure 131/83, pulse 76, temperature 98.1 F (36.7 C), temperature source Oral, resp. rate 18, height 5\' 9"  (1.753 m), weight 75.2 kg, SpO2 96 %. Physical Exam  Constitutional: He appears well-developed and well-nourished. No distress.  HENT:  Head:    Eyes:    6 cm laceration noted above left eyebrow, wound bed is clean, no debris noted.  Muscle is exposed.  No purulence.  Some ecchymosis noted medially.  Vision intact.   Moderate discharge - serosanguineous.  Respiratory: Effort normal.  Skin: Skin is warm and dry. He is not diaphoretic. No erythema.  Psychiatric: He has a normal mood and affect. Judgment normal.    Assessment/Plan:  Left eyebrow laceration repair today with Dr. Claudia Desanctis.  Patient tolerated the procedure well.  See procedure note for full details.  Antibiotic ointment ordered for patient to apply daily.  Please call with any questions or concerns.  Patient to follow-up with in our office 1 week after discharge.   Carola Rhine Mylik Pro, PA-C 07/28/2019, 6:02 PM

## 2019-07-29 ENCOUNTER — Telehealth: Payer: Self-pay | Admitting: Cardiovascular Disease

## 2019-07-29 ENCOUNTER — Inpatient Hospital Stay (HOSPITAL_COMMUNITY): Payer: Medicare Other

## 2019-07-29 ENCOUNTER — Encounter (HOSPITAL_COMMUNITY): Payer: Self-pay | Admitting: Thoracic Surgery (Cardiothoracic Vascular Surgery)

## 2019-07-29 ENCOUNTER — Inpatient Hospital Stay (HOSPITAL_COMMUNITY)
Admission: RE | Disposition: A | Discharge status: Home / Self Care | Payer: Self-pay | Source: Home / Self Care | Attending: Thoracic Surgery (Cardiothoracic Vascular Surgery)

## 2019-07-29 DIAGNOSIS — I4819 Other persistent atrial fibrillation: Secondary | ICD-10-CM

## 2019-07-29 DIAGNOSIS — I495 Sick sinus syndrome: Secondary | ICD-10-CM

## 2019-07-29 DIAGNOSIS — I441 Atrioventricular block, second degree: Secondary | ICD-10-CM

## 2019-07-29 HISTORY — PX: PACEMAKER IMPLANT: EP1218

## 2019-07-29 LAB — BASIC METABOLIC PANEL
Anion gap: 6 (ref 5–15)
BUN: 15 mg/dL (ref 8–23)
CO2: 26 mmol/L (ref 22–32)
Calcium: 8.2 mg/dL — ABNORMAL LOW (ref 8.9–10.3)
Chloride: 102 mmol/L (ref 98–111)
Creatinine, Ser: 0.73 mg/dL (ref 0.61–1.24)
GFR calc Af Amer: >60 mL/min (ref >60)
GFR calc non Af Amer: >60 mL/min (ref >60)
Glucose, Bld: 125 mg/dL — ABNORMAL HIGH (ref 70–99)
Potassium: 4.3 mmol/L (ref 3.5–5.1)
Sodium: 134 mmol/L — ABNORMAL LOW (ref 135–145)

## 2019-07-29 LAB — MAGNESIUM: Magnesium: 2 mg/dL (ref 1.7–2.4)

## 2019-07-29 LAB — PROTIME-INR
INR: 1.1 (ref 0.8–1.2)
Prothrombin Time: 13.3 seconds (ref 11.4–15.2)

## 2019-07-29 LAB — CBC
HCT: 38.6 % — ABNORMAL LOW (ref 39.0–52.0)
Hemoglobin: 13.1 g/dL (ref 13.0–17.0)
MCH: 30.9 pg (ref 26.0–34.0)
MCHC: 33.9 g/dL (ref 30.0–36.0)
MCV: 91 fL (ref 80.0–100.0)
Platelets: 134 10*3/uL — ABNORMAL LOW (ref 150–400)
RBC: 4.24 MIL/uL (ref 4.22–5.81)
RDW: 12.9 % (ref 11.5–15.5)
WBC: 7.8 10*3/uL (ref 4.0–10.5)
nRBC: 0 % (ref 0.0–0.2)

## 2019-07-29 LAB — SURGICAL PCR SCREEN
MRSA, PCR: NEGATIVE
Staphylococcus aureus: NEGATIVE

## 2019-07-29 SURGERY — PACEMAKER IMPLANT

## 2019-07-29 MED ORDER — METOPROLOL TARTRATE 25 MG PO TABS
25.0000 mg | ORAL_TABLET | Freq: Four times a day (QID) | ORAL | Status: DC
  Administered 2019-07-29 – 2019-07-30 (×3): 25 mg via ORAL
  Filled 2019-07-29 (×3): qty 1

## 2019-07-29 MED ORDER — SODIUM CHLORIDE 0.9 % IV SOLN
INTRAVENOUS | Status: AC
  Filled 2019-07-29: qty 2

## 2019-07-29 MED ORDER — IOHEXOL 350 MG/ML SOLN
INTRAVENOUS | Status: DC | PRN
  Administered 2019-07-29: 16:00:00 15 mL

## 2019-07-29 MED ORDER — HYDROCODONE-ACETAMINOPHEN 5-325 MG PO TABS: 1.0000 | ORAL_TABLET | ORAL | Status: DC | PRN

## 2019-07-29 MED ORDER — ACETAMINOPHEN 325 MG PO TABS
325.0000 mg | ORAL_TABLET | ORAL | Status: DC | PRN
  Administered 2019-07-30 – 2019-07-31 (×3): 650 mg via ORAL
  Filled 2019-07-29 (×3): qty 2

## 2019-07-29 MED ORDER — HEPARIN (PORCINE) IN NACL 2-0.9 UNITS/ML
INTRAMUSCULAR | Status: AC | PRN
  Administered 2019-07-29: 500 mL

## 2019-07-29 MED ORDER — AMIODARONE IV BOLUS ONLY 150 MG/100ML: 150.0000 mg | Freq: Once | INTRAVENOUS | Status: DC

## 2019-07-29 MED ORDER — SODIUM CHLORIDE 0.9 % IV SOLN: INTRAVENOUS | Status: DC

## 2019-07-29 MED ORDER — CEFAZOLIN SODIUM-DEXTROSE 1-4 GM/50ML-% IV SOLN
1.0000 g | Freq: Four times a day (QID) | INTRAVENOUS | Status: AC
  Administered 2019-07-29 – 2019-07-30 (×3): 1 g via INTRAVENOUS
  Filled 2019-07-29 (×4): qty 50

## 2019-07-29 MED ORDER — CHLORHEXIDINE GLUCONATE 4 % EX LIQD
60.0000 mL | Freq: Once | CUTANEOUS | Status: AC
  Administered 2019-07-29: 4 via TOPICAL
  Filled 2019-07-29: qty 60

## 2019-07-29 MED ORDER — DOCUSATE SODIUM 100 MG PO CAPS
200.0000 mg | ORAL_CAPSULE | Freq: Two times a day (BID) | ORAL | Status: DC
  Administered 2019-07-29 – 2019-07-31 (×5): 200 mg via ORAL
  Filled 2019-07-29 (×6): qty 2

## 2019-07-29 MED ORDER — CHLORHEXIDINE GLUCONATE 4 % EX LIQD: 60.0000 mL | Freq: Once | CUTANEOUS | Status: DC

## 2019-07-29 MED ORDER — AMIODARONE HCL 200 MG PO TABS
400.0000 mg | ORAL_TABLET | Freq: Two times a day (BID) | ORAL | Status: DC
  Administered 2019-07-29 – 2019-08-01 (×6): 400 mg via ORAL
  Filled 2019-07-29 (×6): qty 2

## 2019-07-29 MED ORDER — LIDOCAINE HCL (PF) 1 % IJ SOLN
INTRAMUSCULAR | Status: AC
  Filled 2019-07-29: qty 30

## 2019-07-29 MED ORDER — METOPROLOL TARTRATE 25 MG PO TABS: 25.0000 mg | ORAL_TABLET | Freq: Two times a day (BID) | ORAL | Status: DC

## 2019-07-29 MED ORDER — MIDAZOLAM HCL 5 MG/5ML IJ SOLN
INTRAMUSCULAR | Status: AC
  Filled 2019-07-29: qty 5

## 2019-07-29 MED ORDER — SODIUM CHLORIDE 0.9 % IV SOLN
80.0000 mg | INTRAVENOUS | Status: AC
  Administered 2019-07-29: 80 mg
  Filled 2019-07-29: qty 2

## 2019-07-29 MED ORDER — CEFAZOLIN SODIUM-DEXTROSE 2-4 GM/100ML-% IV SOLN
INTRAVENOUS | Status: AC
  Filled 2019-07-29: qty 100

## 2019-07-29 MED ORDER — SODIUM CHLORIDE 0.9% FLUSH: 3.0000 mL | Freq: Two times a day (BID) | INTRAVENOUS | Status: DC

## 2019-07-29 MED ORDER — SODIUM CHLORIDE 0.9% FLUSH
3.0000 mL | Freq: Two times a day (BID) | INTRAVENOUS | Status: DC
  Administered 2019-07-29 – 2019-08-01 (×6): 3 mL via INTRAVENOUS

## 2019-07-29 MED ORDER — AMIODARONE IV BOLUS ONLY 150 MG/100ML
150.0000 mg | Freq: Once | INTRAVENOUS | Status: AC
  Administered 2019-07-29: 150 mg via INTRAVENOUS
  Filled 2019-07-29: qty 100

## 2019-07-29 MED ORDER — SODIUM CHLORIDE 0.9 % IV SOLN: 250.0000 mL | INTRAVENOUS | Status: DC

## 2019-07-29 MED ORDER — SODIUM CHLORIDE 0.9% FLUSH: 3.0000 mL | INTRAVENOUS | Status: DC | PRN

## 2019-07-29 MED ORDER — HEPARIN (PORCINE) IN NACL 1000-0.9 UT/500ML-% IV SOLN
INTRAVENOUS | Status: AC
  Filled 2019-07-29: qty 500

## 2019-07-29 MED ORDER — FENTANYL CITRATE (PF) 100 MCG/2ML IJ SOLN
INTRAMUSCULAR | Status: AC
  Filled 2019-07-29: qty 2

## 2019-07-29 MED ORDER — ONDANSETRON HCL 4 MG/2ML IJ SOLN
4.0000 mg | Freq: Four times a day (QID) | INTRAMUSCULAR | Status: DC | PRN
  Administered 2019-07-29: 4 mg via INTRAVENOUS
  Filled 2019-07-29: qty 2

## 2019-07-29 MED ORDER — MIDAZOLAM HCL 5 MG/5ML IJ SOLN
INTRAMUSCULAR | Status: DC | PRN
  Administered 2019-07-29: 2 mg via INTRAVENOUS

## 2019-07-29 MED ORDER — LIDOCAINE HCL (PF) 1 % IJ SOLN
INTRAMUSCULAR | Status: DC | PRN
  Administered 2019-07-29: 80 mL

## 2019-07-29 MED ORDER — CEFAZOLIN SODIUM-DEXTROSE 2-4 GM/100ML-% IV SOLN
2.0000 g | INTRAVENOUS | Status: AC
  Administered 2019-07-29: 2 g via INTRAVENOUS
  Filled 2019-07-29: qty 100

## 2019-07-29 MED ORDER — BACITRACIN-POLYMYXIN B 500-10000 UNIT/GM OP OINT
1.0000 "application " | TOPICAL_OINTMENT | Freq: Two times a day (BID) | OPHTHALMIC | Status: DC
  Administered 2019-07-29 – 2019-08-01 (×7): 1 via OPHTHALMIC
  Filled 2019-07-29: qty 3.5

## 2019-07-29 MED ORDER — LIDOCAINE HCL 1 % IJ SOLN
INTRAMUSCULAR | Status: AC
  Filled 2019-07-29: qty 20

## 2019-07-29 MED ORDER — AMIODARONE HCL 200 MG PO TABS: 200.0000 mg | ORAL_TABLET | Freq: Two times a day (BID) | ORAL | Status: DC

## 2019-07-29 MED ORDER — SODIUM CHLORIDE 0.9 % IV SOLN: 250.0000 mL | INTRAVENOUS | Status: DC | PRN

## 2019-07-29 SURGICAL SUPPLY — 9 items
CABLE SURGICAL S-101-97-12 (CABLE) ×3 IMPLANT
GUIDEWIRE ANGLED .035X150CM (WIRE) ×2 IMPLANT
KIT MICROPUNCTURE NIT STIFF (SHEATH) ×2 IMPLANT
LEAD TENDRIL MRI 46CM LPA1200M (Lead) ×2 IMPLANT
LEAD TENDRIL MRI 58CM LPA1200M (Lead) ×2 IMPLANT
PACEMAKER ASSURITY DR-RF (Pacemaker) ×2 IMPLANT
PAD PRO RADIOLUCENT 2001M-C (PAD) ×3 IMPLANT
SHEATH 8FR PRELUDE SNAP 13 (SHEATH) ×4 IMPLANT
TRAY PACEMAKER INSERTION (PACKS) ×3 IMPLANT

## 2019-07-29 NOTE — Progress Notes (Signed)
Approx 1330 pt assisted to toilet per request to have a BM without difficulty. Checked on pt and their progress.  Upon last check pt noted on floor facing door on knees and forearms.   His glasses were imbedded in skin above left eyebrow and laceration below eye. Pt alert, oriented, stated he did loose consciousness and aware he had fallen.   Reports he was on toilet straining to have BM then reported dizziness prior to falling.    PA notified, RR notified, charge nurse notified.   Glasses removed by PA x 2 & derma-bonded below eye laceration  Pt transferred via lift staff to bed per protocol. Pt then transferred to CT by this nurse and staff member.

## 2019-07-29 NOTE — H&P (View-Only) (Signed)
Cardiology Consultation:   Patient ID: Jerry Foster MRN: [id number]; DOB: October 08, 1950  Admit date: 07/23/2019 Date of Consult: 07/29/2019  Primary Care Provider: Jefm Petty, MD Primary Cardiologist:  Primary Electrophysiologist:     Patient Profile:   Jerry Foster is a 68 y.o. male with a hx of RBBB, VHD w/AS, HLD  who is being seen today for the evaluation of syncope, bradycardia at the request of Dr. Roxy Manns.  History of Present Illness:    Jerry Foster was admitted 4/30 for AVR, he underwent Minimally Invasive Aortic Valve Replacement, Right Anterior Mini-thoracotomyEdwards Inspiris Resilia Stented Bovine Pericardial Valve POD #2 he developed AFib that has become persistent, initially pacer dependent > rate controlled amiodarone on board Had reported intermittent second degree AVBlock POD #3 with rates 40's-50's amio > PO POD # 4 warfarin started Yesterday POD #5 after having a BM upon standing he fainted,  Associated with 6 second pause, his temp pacing wires were in place though not connected to temp pacer any longer Was placed back on VVI 60 back-up  He had recurrent AFib last night treated with IV lopressor following had 26 seconds of V standstill associated with syncope, he was at rest in bed  Plastics repaired L eyebrow laceration  Labs reviewed   Past Medical History:  Diagnosis Date  . Aortic stenosis   . Benign prostatic hyperplasia   . Bronchitis   . Cataract    bilateral   . CHF (congestive heart failure) ([place])   . Colon polyps   . Complete heart block ([place]) 07/28/2019  . Heart murmur   . History of fracture of vertebral column   . Hyperlipidemia   . Kidney stone   . Nonrheumatic aortic valve stenosis   . Osteoporosis   . Osteoporosis   . Pure hypercholesterolemia   . RBBB   . 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   21 mm Edwards Inspiris Resilia stented bovine pericardial tissue valve via right mini  thoracotomy approach  . Sensorineural hearing loss of both ears   . Thrombosed hemorrhoids    hsitory of  . Tubular adenoma of colon   . Vitamin D deficiency     Past Surgical History:  Procedure Laterality Date  . AORTIC VALVE REPLACEMENT N/A 07/23/2019   Procedure: MINIMALLY INVASIVE AORTIC VALVE REPLACEMENT (AVR) using Margaretha Sheffield Resilia 21 MM Aortic Valve.;  Surgeon: Rexene Alberts, MD;  Location: [place];  Service: Open Heart Surgery;  Laterality: N/A;  . COLON SURGERY  2006   1 foot ascending colon removed   . COLONOSCOPY    . HERNIA REPAIR    . POLYPECTOMY    . RIGHT HEART CATH AND CORONARY ANGIOGRAPHY N/A 06/08/2019   Procedure: RIGHT HEART CATH AND CORONARY ANGIOGRAPHY;  Surgeon: Sherren Mocha, MD;  Location: [facility];  Service: Cardiovascular;  Laterality: N/A;  . TEE WITHOUT CARDIOVERSION N/A 07/23/2019   Procedure: TRANSESOPHAGEAL ECHOCARDIOGRAM (TEE);  Surgeon: Rexene Alberts, MD;  Location: [facility];  Service: Open Heart Surgery;  Laterality: N/A;  . TESTICLE SURGERY       Home Medications:  Prior to Admission medications   Medication Sig Start Date End Date Taking? Authorizing Provider  acetaminophen (TYLENOL) 500 MG tablet Take 500 mg by mouth every 8 (eight) hours as needed for moderate pain or headache.   Yes [provider]  Calcium Citrate-Vitamin D (CITRACAL + D PO) Take 2 tablets by mouth in the morning and at bedtime.  Yes [provider]  Cholecalciferol (DIALYVITE VITAMIN D 5000) 125 MCG (5000 UT) capsule Take 5,000 Units by mouth daily.   Yes [provider]  ibuprofen (ADVIL) 200 MG tablet Take 400 mg by mouth every 6 (six) hours as needed for moderate pain.   Yes [provider]  Magnesium 400 MG CAPS Take 400 mg by mouth daily.   Yes [provider]  Omega-3 1400 MG CAPS Take 1,400 mg by mouth daily.   Yes [provider]  pravastatin (PRAVACHOL) 40 MG tablet Take 40 mg by mouth. 06/18/16   Yes [provider]    Inpatient Medications: Scheduled Meds: . amiodarone  200 mg Oral Daily  . aspirin EC  81 mg Oral Daily  . bisacodyl  10 mg Oral Daily   Or  . bisacodyl  10 mg Rectal Daily  . Chlorhexidine Gluconate Cloth  6 each Topical Daily  . docusate sodium  200 mg Oral Daily  . enoxaparin (LOVENOX) injection  30 mg Subcutaneous QHS  . lidocaine-EPINEPHrine  20 mL Intradermal STAT  . pantoprazole  40 mg Oral Daily  . pentafluoroprop-tetrafluoroeth   Topical Once  . pravastatin  40 mg Oral Daily  . sodium chloride flush  3 mL Intravenous Q12H  . tamsulosin  0.4 mg Oral Daily  . warfarin  2.5 mg Oral q1600  . Warfarin - Physician Dosing Inpatient   Does not apply q1600   Continuous Infusions: . sodium chloride    . lactated ringers     PRN Meds: lidocaine HCl (PF), lip balm, ondansetron (ZOFRAN) IV, oxyCODONE, sodium chloride flush, traMADol  Allergies:    Allergies  Allergen Reactions  . Terbinafine Hcl Other (See Comments)    Elevated LFT Lamisil     Social History:   Social History   Socioeconomic History  . Marital status: Single    Spouse name: Not on file  . Number of children: Not on file  . Years of education: Not on file  . Highest education level: Not on file  Occupational History  . Not on file  Tobacco Use  . Smoking status: Never Smoker  . Smokeless tobacco: Never Used  Substance and Sexual Activity  . Alcohol use: Yes    Comment: Moderately, 2 bottles wine per week  . Drug use: No  . Sexual activity: Not on file  Other Topics Concern  . Not on file  Social History Narrative  . Not on file   Social Determinants of Health   Financial Resource Strain:   . Difficulty of Paying Living Expenses:   Food Insecurity:   . Worried About Charity fundraiser in the Last Year:   . Arboriculturist in the Last Year:   Transportation Needs:   . Film/video editor (Medical):   [name] Lack of Transportation (Non-Medical):   Physical  Activity:   . Days of Exercise per Week:   . Minutes of Exercise per Session:   Stress:   . Feeling of Stress :   Social Connections:   . Frequency of Communication with Friends and Family:   . Frequency of Social Gatherings with Friends and Family:   . Attends Religious Services:   . Active Member of Clubs or Organizations:   . Attends Archivist Meetings:   [name] Marital Status:   Intimate Partner Violence:   . Fear of Current or Ex-Partner:   . Emotionally Abused:   [name] Physically Abused:   . Sexually  Abused:     Family History:   Family History  Problem Relation Age of Onset  . Colon cancer Neg Hx   . Colon polyps Neg Hx   . Esophageal cancer Neg Hx   . Rectal cancer Neg Hx   . Stomach cancer Neg Hx      ROS:  Please see the history of present illness.  All other ROS reviewed and negative.     Physical Exam/Data:   Vitals:   07/29/19 0600 07/29/19 0615 07/29/19 0630 07/29/19 0757  BP: 120/89 (!) 142/113 123/85   Pulse: 91 (!) 112 (!) 107   Resp: (!) 21 (!) 22 (!) 21   Temp:    98.3 F (36.8 C)  TempSrc:    Oral  SpO2: 99% 97% 98%   Weight:      Height:        Intake/Output Summary (Last 24 hours) at 07/29/2019 0801 Last data filed at 07/29/2019 0500 Gross per 24 hour  Intake --  Output 1100 ml  Net -1100 ml   Last 3 Weights 07/29/2019 07/28/2019 07/27/2019  Weight (lbs) 164 lb 7.4 oz 165 lb 12.6 oz 170 lb 13.7 oz  Weight (kg) 74.6 kg 75.2 kg 77.5 kg     Body mass index is 24.29 kg/m.  General:  Well nourished, well developed, in no acute distress (talking on the phone) HEENT: L periorbital laceration/ecchymosis, swelling Lymph: no adenopathy Neck: no JVD Endocrine:  No thryomegaly Vascular: No carotid bruits  Cardiac:  irreg-irreg; no murmurs, gallops or rubs Lungs:  CTA b/l (ant/lat auscultation), no wheezing, rhonchi or rales  Abd: soft, nontender Ext: no edema Musculoskeletal:  No deformities Skin: warm and dry  Neuro:  No gross focal  abnormalities noted Psych:  Normal affect   EKG:  The EKG was personally reviewed and demonstrates:   SB 57, 1st degree AVblock, RBBB  Last night  AFib 126bpm, RBBB  Telemetry:  Telemetry was personally reviewed and demonstrates:   AFib 110's  Last night had 26 seconds of V standstill lack of pacing capture Has had a number of shorter pauses 4-5 seconds, none since in ICU  Relevant CV Studies:  07/23/2019: TEE (interop) POST-OP IMPRESSIONS  Overall, there were no significant changes from pre-bypass.  - Aortic Valve: No regurgitation post repair. The gradient recorded across  the  prosthetic valve is within the expected range, measuring 9 cm/s. No  perivalvular  leak noted.   PRE-OP FINDINGS  Left Ventricle: The left ventricle has normal systolic function, with an  ejection fraction of 60-65%. The cavity size was normal. There is severely  increased left ventricular wall thickness. No evidence of left ventricular  regional wall motion  abnormalities.   Right Ventricle: The right ventricle has normal systolic function. The  cavity was normal. There is no increase in right ventricular wall  thickness.   Left Atrium: Left atrial size was normal in size. The left atrial  appendage is well visualized and there is no evidence of thrombus present.   Right Atrium: Right atrial size was normal in size. Right atrial pressure  is estimated at 10 mmHg.   Interatrial Septum: No atrial level shunt detected by color flow Doppler.   Pericardium: There is no evidence of pericardial effusion.   Mitral Valve: The mitral valve is normal in structure. Mild thickening of  the mitral valve leaflet. Mild calcification of the mitral valve leaflet.  Mitral valve regurgitation is mild by color flow Doppler. The MR jet  is  centrally-directed.   Tricuspid Valve: The tricuspid valve was normal in structure. Tricuspid  valve regurgitation is trivial by color flow Doppler. The jet is directed    centrally. The tricuspid valve is mildly thickened. The tricuspid valve is  mildly calcified.   Aortic Valve: The aortic valve is bicuspid There is severe thickening of  the aortic valve, There is severe calcifcation of the aortic valve and,  with severely decreased cusp excursion. Aortic valve regurgitation is mild  by color flow Doppler. The jet is  eccentric anteriorly directed. There is severe stenosis of the aortic  valve. There is Severe aortic annular calcification noted.   Pulmonic Valve: The pulmonic valve was normal in structure.  Pulmonic valve regurgitation is mild by color flow Doppler.    Aorta: The aortic root and ascending aorta are normal in size and  structure. The aortic arch was not well visualized. There is evidence of  layered immobile plaque in the descending aorta; Grade II, measuring 2-27mm  in size.   06/08/2019: R/LHC 1. Mild nonobstructive CAD without any high-grade or flow-limiting stenoses 2. Bulky aortic valve calcification with restricted leaflet mobility 3. Normal right heart pressures/preserved cardiac output  Laboratory Data:  High Sensitivity Troponin:  No results for input(s): TROPONINIHS in the last 720 hours.   Chemistry Recent Labs  Lab 07/28/19 0332 07/28/19 2254 07/29/19 0313  NA 135 136 134*  K 3.7 4.0 4.3  CL 100 101 102  CO2 25 24 26   GLUCOSE 120* 130* 125*  BUN 15 14 15   CREATININE 0.63 0.72 0.73  CALCIUM 8.2* 8.4* 8.2*  GFRNONAA >60 >60 >60  GFRAA >60 >60 >60  ANIONGAP 10 11 6     No results for input(s): PROT, ALBUMIN, AST, ALT, ALKPHOS, BILITOT in the last 168 hours. Hematology Recent Labs  Lab 07/26/19 0421 07/27/19 0604 07/29/19 0313  WBC 12.1* 7.1 7.8  RBC 4.48 4.33 4.24  HGB 13.8 13.4 13.1  HCT 41.6 39.7 38.6*  MCV 92.9 91.7 91.0  MCH 30.8 30.9 30.9  MCHC 33.2 33.8 33.9  RDW 13.0 12.8 12.9  PLT 75* 89* 134*   BNPNo results for input(s): BNP, PROBNP in the last 168 hours.  DDimer No results for input(s):  DDIMER in the last 168 hours.   Radiology/Studies:   CT HEAD WO CONTRAST  Result Date: 07/28/2019 CLINICAL DATA:  [place], anticoagulated, syncope EXAM: CT HEAD WITHOUT CONTRAST TECHNIQUE: Contiguous axial images were obtained from the base of the skull through the vertex without intravenous contrast. COMPARISON:  None. FINDINGS: Brain: No acute infarct or hemorrhage. Lateral ventricles and midline structures are unremarkable. No acute extra-axial fluid collections. No mass effect. Vascular: No hyperdense vessel or unexpected calcification. Skull: No acute displaced fractures. Sclerosis and calvarial thickening are seen within the right orbital roof and right frontal bone of the calvarium, most compatible with focal Paget disease. Sinuses/Orbits: Sinuses are clear. Other: None. IMPRESSION: 1. No acute intracranial process. 2. 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 No acute bony abnormalities. Electronically Signed   By: Randa Ngo M.D.   On: 07/28/2019 16:23   CT CERVICAL SPINE WO CONTRAST Result Date: 07/28/2019 CLINICAL DATA:  [place], loss of consciousness, syncope EXAM: CT CERVICAL SPINE WITHOUT CONTRAST TECHNIQUE: Multidetector CT imaging of the cervical spine was performed without intravenous contrast. Multiplanar CT image reconstructions were also generated. COMPARISON:  None. FINDINGS: Alignment: Alignment is grossly anatomic. Skull base and vertebrae: There is a small displaced fracture through the  anterior inferior margin of the C6 vertebral body, best seen on sagittal reconstructed images consistent with hyperextension injury. Minimal distraction at the fracture site. No other acute displaced fractures. Soft tissues and spinal canal: No prevertebral fluid or swelling. No visible canal hematoma. Disc levels: There is mild diffuse spondylosis and facet hypertrophy. Changes are most pronounced at C3/C4, C5/C6, and C6/C7. There is bilateral neural foraminal encroachment at  these levels as result of circumferential disc osteophyte complex. Changes are slightly eccentric to the left. Upper chest: Trace right apical pneumothorax is seen, reported on prior chest x-ray. Other: Reconstructed images demonstrate no additional findings. IMPRESSION: 1. Avulsion fracture anterior inferior aspect of the C6 vertebral body as above, consistent with hyperextension injury. Minimal distraction of the fracture fragment. 2. Multilevel cervical spondylosis and facet hypertrophy as above, greatest at C3-4, C5-6, and C6-7. These results were called by telephone at the time of interpretation on 07/28/2019 at 4:37 pm to provider [facility] , who verbally acknowledged these results. Electronically Signed   By: Randa Ngo M.D.   On: 07/28/2019 16:38   DG Chest Port 1 View Result Date: 07/27/2019 CLINICAL DATA:  Shortness of breath. EXAM: PORTABLE CHEST 1 VIEW COMPARISON:  Jul 25, 2019. FINDINGS: Stable cardiomediastinal silhouette. Right-sided chest tube has been removed. Minimal right apical pneumothorax is noted. Mild bibasilar subsegmental atelectasis is noted with minimal bilateral pleural effusions. Bony thorax is unremarkable. IMPRESSION: Right-sided chest tube has been removed. Minimal right apical pneumothorax is noted. Mild bibasilar subsegmental atelectasis is noted with minimal bilateral pleural effusions. Electronically Signed   By: Marijo Conception M.D.   On: 07/27/2019 08:52     Assessment and Plan:   1. Tachy-brady syndrome     Had baseline conduction system disease     Will need PPM     Preserved LVEF  Hold amio this AM given some difficulty with capture last night reported Hold DVT prophylaxis INR only 1.1, allow slow rise please given he will have fresh pacer pocket  Dr. Rayann Heman has seen and examined the patient, discussed rational for PPM, discussed procedure, potential risks, benefits.  He is agreeable to proceed  Pt with C collar on 2/2 Avulsion fracture anterior  inferior aspect of the C6 vertebral body as above, consistent with hyperextension injury. Minimal distraction of the fracture fragment.  Per RN Neurosurgery has seen the patient today, to keep C collar on, no plans for intervention/surgery, aware of plans for pacing and deferred MRI She has placed a call to them if they would like to get imaging needed done prior to implant      For questions or updates, please contact [facility] Please consult [email or web address] for contact info under     Signed, Baldwin Jamaica, PA-C  07/29/2019 8:01 AM   I have seen, examined the patient, and reviewed the above assessment and plan.  Changes to above are made where necessary.  On exam, iRRR.  Currently in afib with RVR. The patient has chronic conduction system disease with presyncope in the past.  Now documented to have transient complete heart block with prolonged RR intervals and syncope.    The patient has symptomatic bradycardia.  I would therefore recommend pacemaker implantation at this time.  Risks, benefits, alternatives to pacemaker implantation were discussed in detail with the patient today. The patient understands that the risks include but are not limited to bleeding, infection, pneumothorax, perforation, tamponade, vascular damage, renal failure, MI, stroke, death,  and lead dislodgement and wishes  to proceed. We will therefore schedule the procedure this afternoon.    Co Sign: [name], MD 07/29/2019 10:26 AM

## 2019-07-29 NOTE — Consult Note (Addendum)
Cardiology Consultation:   Patient ID: Jerry Foster MRN: [id number]; DOB: 10/15/1950  Admit date: 07/23/2019 Date of Consult: 07/29/2019  Primary Care Provider: Jefm Petty, MD Primary Cardiologist:  Primary Electrophysiologist:     Patient Profile:   Jerry Foster is a 69 y.o. male with a hx of RBBB, VHD w/AS, HLD  who is being seen today for the evaluation of syncope, bradycardia at the request of Dr. Roxy Manns.  History of Present Illness:    Mr. Koss was admitted 4/30 for AVR, he underwent Minimally Invasive Aortic Valve Replacement, Right Anterior Mini-thoracotomyEdwards Inspiris Resilia Stented Bovine Pericardial Valve POD #2 he developed AFib that has become persistent, initially pacer dependent > rate controlled amiodarone on board Had reported intermittent second degree AVBlock POD #3 with rates 40's-50's amio > PO POD # 4 warfarin started Yesterday POD #5 after having a BM upon standing he fainted,  Associated with 6 second pause, his temp pacing wires were in place though not connected to temp pacer any longer Was placed back on VVI 60 back-up  He had recurrent AFib last night treated with IV lopressor following had 26 seconds of V standstill associated with syncope, he was at rest in bed  Plastics repaired L eyebrow laceration  Labs reviewed   Past Medical History:  Diagnosis Date  . Aortic stenosis   . Benign prostatic hyperplasia   . Bronchitis   . Cataract    bilateral   . CHF (congestive heart failure) ([place])   . Colon polyps   . Complete heart block ([place]) 07/28/2019  . Heart murmur   . History of fracture of vertebral column   . Hyperlipidemia   . Kidney stone   . Nonrheumatic aortic valve stenosis   . Osteoporosis   . Osteoporosis   . Pure hypercholesterolemia   . RBBB   . 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   21 mm Edwards Inspiris Resilia stented bovine pericardial tissue valve via right mini  thoracotomy approach  . Sensorineural hearing loss of both ears   . Thrombosed hemorrhoids    hsitory of  . Tubular adenoma of colon   . Vitamin D deficiency     Past Surgical History:  Procedure Laterality Date  . AORTIC VALVE REPLACEMENT N/A 07/23/2019   Procedure: MINIMALLY INVASIVE AORTIC VALVE REPLACEMENT (AVR) using Margaretha Sheffield Resilia 21 MM Aortic Valve.;  Surgeon: Rexene Alberts, MD;  Location: [facility];  Service: Open Heart Surgery;  Laterality: N/A;  . COLON SURGERY  2006   1 foot ascending colon removed   . COLONOSCOPY    . HERNIA REPAIR    . POLYPECTOMY    . RIGHT HEART CATH AND CORONARY ANGIOGRAPHY N/A 06/08/2019   Procedure: RIGHT HEART CATH AND CORONARY ANGIOGRAPHY;  Surgeon: Sherren Mocha, MD;  Location: [facility];  Service: Cardiovascular;  Laterality: N/A;  . TEE WITHOUT CARDIOVERSION N/A 07/23/2019   Procedure: TRANSESOPHAGEAL ECHOCARDIOGRAM (TEE);  Surgeon: Rexene Alberts, MD;  Location: [facility];  Service: Open Heart Surgery;  Laterality: N/A;  . TESTICLE SURGERY       Home Medications:  Prior to Admission medications   Medication Sig Start Date End Date Taking? Authorizing Provider  acetaminophen (TYLENOL) 500 MG tablet Take 500 mg by mouth every 8 (eight) hours as needed for moderate pain or headache.   Yes [provider]  Calcium Citrate-Vitamin D (CITRACAL + D PO) Take 2 tablets by mouth in the morning and at bedtime.  Yes [provider]  Cholecalciferol (DIALYVITE VITAMIN D 5000) 125 MCG (5000 UT) capsule Take 5,000 Units by mouth daily.   Yes [provider]  ibuprofen (ADVIL) 200 MG tablet Take 400 mg by mouth every 6 (six) hours as needed for moderate pain.   Yes [provider]  Magnesium 400 MG CAPS Take 400 mg by mouth daily.   Yes [provider]  Omega-3 1400 MG CAPS Take 1,400 mg by mouth daily.   Yes [provider]  pravastatin (PRAVACHOL) 40 MG tablet Take 40 mg by mouth. 06/18/16   Yes [provider]    Inpatient Medications: Scheduled Meds: . amiodarone  200 mg Oral Daily  . aspirin EC  81 mg Oral Daily  . bisacodyl  10 mg Oral Daily   Or  . bisacodyl  10 mg Rectal Daily  . Chlorhexidine Gluconate Cloth  6 each Topical Daily  . docusate sodium  200 mg Oral Daily  . enoxaparin (LOVENOX) injection  30 mg Subcutaneous QHS  . lidocaine-EPINEPHrine  20 mL Intradermal STAT  . pantoprazole  40 mg Oral Daily  . pentafluoroprop-tetrafluoroeth   Topical Once  . pravastatin  40 mg Oral Daily  . sodium chloride flush  3 mL Intravenous Q12H  . tamsulosin  0.4 mg Oral Daily  . warfarin  2.5 mg Oral q1600  . Warfarin - Physician Dosing Inpatient   Does not apply q1600   Continuous Infusions: . sodium chloride    . lactated ringers     PRN Meds: lidocaine HCl (PF), lip balm, ondansetron (ZOFRAN) IV, oxyCODONE, sodium chloride flush, traMADol  Allergies:    Allergies  Allergen Reactions  . Terbinafine Hcl Other (See Comments)    Elevated LFT Lamisil     Social History:   Social History   Socioeconomic History  . Marital status: Single    Spouse name: Not on file  . Number of children: Not on file  . Years of education: Not on file  . Highest education level: Not on file  Occupational History  . Not on file  Tobacco Use  . Smoking status: Never Smoker  . Smokeless tobacco: Never Used  Substance and Sexual Activity  . Alcohol use: Yes    Comment: Moderately, 2 bottles wine per week  . Drug use: No  . Sexual activity: Not on file  Other Topics Concern  . Not on file  Social History Narrative  . Not on file   Social Determinants of Health   Financial Resource Strain:   . Difficulty of Paying Living Expenses:   Food Insecurity:   . Worried About Charity fundraiser in the Last Year:   . Arboriculturist in the Last Year:   Transportation Needs:   . Film/video editor (Medical):   [name] Lack of Transportation (Non-Medical):   Physical  Activity:   . Days of Exercise per Week:   . Minutes of Exercise per Session:   Stress:   . Feeling of Stress :   Social Connections:   . Frequency of Communication with Friends and Family:   . Frequency of Social Gatherings with Friends and Family:   . Attends Religious Services:   . Active Member of Clubs or Organizations:   . Attends Archivist Meetings:   [name] Marital Status:   Intimate Partner Violence:   . Fear of Current or Ex-Partner:   . Emotionally Abused:   [name] Physically Abused:   . Sexually  Abused:     Family History:   Family History  Problem Relation Age of Onset  . Colon cancer Neg Hx   . Colon polyps Neg Hx   . Esophageal cancer Neg Hx   . Rectal cancer Neg Hx   . Stomach cancer Neg Hx      ROS:  Please see the history of present illness.  All other ROS reviewed and negative.     Physical Exam/Data:   Vitals:   07/29/19 0600 07/29/19 0615 07/29/19 0630 07/29/19 0757  BP: 120/89 (!) 142/113 123/85   Pulse: 91 (!) 112 (!) 107   Resp: (!) 21 (!) 22 (!) 21   Temp:    98.3 F (36.8 C)  TempSrc:    Oral  SpO2: 99% 97% 98%   Weight:      Height:        Intake/Output Summary (Last 24 hours) at 07/29/2019 0801 Last data filed at 07/29/2019 0500 Gross per 24 hour  Intake --  Output 1100 ml  Net -1100 ml   Last 3 Weights 07/29/2019 07/28/2019 07/27/2019  Weight (lbs) 164 lb 7.4 oz 165 lb 12.6 oz 170 lb 13.7 oz  Weight (kg) 74.6 kg 75.2 kg 77.5 kg     Body mass index is 24.29 kg/m.  General:  Well nourished, well developed, in no acute distress (talking on the phone) HEENT: L periorbital laceration/ecchymosis, swelling Lymph: no adenopathy Neck: no JVD Endocrine:  No thryomegaly Vascular: No carotid bruits  Cardiac:  irreg-irreg; no murmurs, gallops or rubs Lungs:  CTA b/l (ant/lat auscultation), no wheezing, rhonchi or rales  Abd: soft, nontender Ext: no edema Musculoskeletal:  No deformities Skin: warm and dry  Neuro:  No gross focal  abnormalities noted Psych:  Normal affect   EKG:  The EKG was personally reviewed and demonstrates:   SB 57, 1st degree AVblock, RBBB  Last night  AFib 126bpm, RBBB  Telemetry:  Telemetry was personally reviewed and demonstrates:   AFib 110's  Last night had 26 seconds of V standstill lack of pacing capture Has had a number of shorter pauses 4-5 seconds, none since in ICU  Relevant CV Studies:  07/23/2019: TEE (interop) POST-OP IMPRESSIONS  Overall, there were no significant changes from pre-bypass.  - Aortic Valve: No regurgitation post repair. The gradient recorded across  the  prosthetic valve is within the expected range, measuring 9 cm/s. No  perivalvular  leak noted.   PRE-OP FINDINGS  Left Ventricle: The left ventricle has normal systolic function, with an  ejection fraction of 60-65%. The cavity size was normal. There is severely  increased left ventricular wall thickness. No evidence of left ventricular  regional wall motion  abnormalities.   Right Ventricle: The right ventricle has normal systolic function. The  cavity was normal. There is no increase in right ventricular wall  thickness.   Left Atrium: Left atrial size was normal in size. The left atrial  appendage is well visualized and there is no evidence of thrombus present.   Right Atrium: Right atrial size was normal in size. Right atrial pressure  is estimated at 10 mmHg.   Interatrial Septum: No atrial level shunt detected by color flow Doppler.   Pericardium: There is no evidence of pericardial effusion.   Mitral Valve: The mitral valve is normal in structure. Mild thickening of  the mitral valve leaflet. Mild calcification of the mitral valve leaflet.  Mitral valve regurgitation is mild by color flow Doppler. The MR jet  is  centrally-directed.   Tricuspid Valve: The tricuspid valve was normal in structure. Tricuspid  valve regurgitation is trivial by color flow Doppler. The jet is directed    centrally. The tricuspid valve is mildly thickened. The tricuspid valve is  mildly calcified.   Aortic Valve: The aortic valve is bicuspid There is severe thickening of  the aortic valve, There is severe calcifcation of the aortic valve and,  with severely decreased cusp excursion. Aortic valve regurgitation is mild  by color flow Doppler. The jet is  eccentric anteriorly directed. There is severe stenosis of the aortic  valve. There is Severe aortic annular calcification noted.   Pulmonic Valve: The pulmonic valve was normal in structure.  Pulmonic valve regurgitation is mild by color flow Doppler.    Aorta: The aortic root and ascending aorta are normal in size and  structure. The aortic arch was not well visualized. There is evidence of  layered immobile plaque in the descending aorta; Grade II, measuring 2-77mm  in size.   06/08/2019: R/LHC 1. Mild nonobstructive CAD without any high-grade or flow-limiting stenoses 2. Bulky aortic valve calcification with restricted leaflet mobility 3. Normal right heart pressures/preserved cardiac output  Laboratory Data:  High Sensitivity Troponin:  No results for input(s): TROPONINIHS in the last 720 hours.   Chemistry Recent Labs  Lab 07/28/19 0332 07/28/19 2254 07/29/19 0313  NA 135 136 134*  K 3.7 4.0 4.3  CL 100 101 102  CO2 25 24 26   GLUCOSE 120* 130* 125*  BUN 15 14 15   CREATININE 0.63 0.72 0.73  CALCIUM 8.2* 8.4* 8.2*  GFRNONAA >60 >60 >60  GFRAA >60 >60 >60  ANIONGAP 10 11 6     No results for input(s): PROT, ALBUMIN, AST, ALT, ALKPHOS, BILITOT in the last 168 hours. Hematology Recent Labs  Lab 07/26/19 0421 07/27/19 0604 07/29/19 0313  WBC 12.1* 7.1 7.8  RBC 4.48 4.33 4.24  HGB 13.8 13.4 13.1  HCT 41.6 39.7 38.6*  MCV 92.9 91.7 91.0  MCH 30.8 30.9 30.9  MCHC 33.2 33.8 33.9  RDW 13.0 12.8 12.9  PLT 75* 89* 134*   BNPNo results for input(s): BNP, PROBNP in the last 168 hours.  DDimer No results for input(s):  DDIMER in the last 168 hours.   Radiology/Studies:   CT HEAD WO CONTRAST  Result Date: 07/28/2019 CLINICAL DATA:  [place], anticoagulated, syncope EXAM: CT HEAD WITHOUT CONTRAST TECHNIQUE: Contiguous axial images were obtained from the base of the skull through the vertex without intravenous contrast. COMPARISON:  None. FINDINGS: Brain: No acute infarct or hemorrhage. Lateral ventricles and midline structures are unremarkable. No acute extra-axial fluid collections. No mass effect. Vascular: No hyperdense vessel or unexpected calcification. Skull: No acute displaced fractures. Sclerosis and calvarial thickening are seen within the right orbital roof and right frontal bone of the calvarium, most compatible with focal Paget disease. Sinuses/Orbits: Sinuses are clear. Other: None. IMPRESSION: 1. No acute intracranial process. 2. 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 No acute bony abnormalities. Electronically Signed   By: Randa Ngo M.D.   On: 07/28/2019 16:23   CT CERVICAL SPINE WO CONTRAST Result Date: 07/28/2019 CLINICAL DATA:  [place], loss of consciousness, syncope EXAM: CT CERVICAL SPINE WITHOUT CONTRAST TECHNIQUE: Multidetector CT imaging of the cervical spine was performed without intravenous contrast. Multiplanar CT image reconstructions were also generated. COMPARISON:  None. FINDINGS: Alignment: Alignment is grossly anatomic. Skull base and vertebrae: There is a small displaced fracture through the  anterior inferior margin of the C6 vertebral body, best seen on sagittal reconstructed images consistent with hyperextension injury. Minimal distraction at the fracture site. No other acute displaced fractures. Soft tissues and spinal canal: No prevertebral fluid or swelling. No visible canal hematoma. Disc levels: There is mild diffuse spondylosis and facet hypertrophy. Changes are most pronounced at C3/C4, C5/C6, and C6/C7. There is bilateral neural foraminal encroachment at  these levels as result of circumferential disc osteophyte complex. Changes are slightly eccentric to the left. Upper chest: Trace right apical pneumothorax is seen, reported on prior chest x-ray. Other: Reconstructed images demonstrate no additional findings. IMPRESSION: 1. Avulsion fracture anterior inferior aspect of the C6 vertebral body as above, consistent with hyperextension injury. Minimal distraction of the fracture fragment. 2. Multilevel cervical spondylosis and facet hypertrophy as above, greatest at C3-4, C5-6, and C6-7. These results were called by telephone at the time of interpretation on 07/28/2019 at 4:37 pm to provider [facility] , who verbally acknowledged these results. Electronically Signed   By: Randa Ngo M.D.   On: 07/28/2019 16:38   DG Chest Port 1 View Result Date: 07/27/2019 CLINICAL DATA:  Shortness of breath. EXAM: PORTABLE CHEST 1 VIEW COMPARISON:  Jul 25, 2019. FINDINGS: Stable cardiomediastinal silhouette. Right-sided chest tube has been removed. Minimal right apical pneumothorax is noted. Mild bibasilar subsegmental atelectasis is noted with minimal bilateral pleural effusions. Bony thorax is unremarkable. IMPRESSION: Right-sided chest tube has been removed. Minimal right apical pneumothorax is noted. Mild bibasilar subsegmental atelectasis is noted with minimal bilateral pleural effusions. Electronically Signed   By: Marijo Conception M.D.   On: 07/27/2019 08:52     Assessment and Plan:   1. Tachy-brady syndrome     Had baseline conduction system disease     Will need PPM     Preserved LVEF  Hold amio this AM given some difficulty with capture last night reported Hold DVT prophylaxis INR only 1.1, allow slow rise please given he will have fresh pacer pocket  Dr. Rayann Heman has seen and examined the patient, discussed rational for PPM, discussed procedure, potential risks, benefits.  He is agreeable to proceed  Pt with C collar on 2/2 Avulsion fracture anterior  inferior aspect of the C6 vertebral body as above, consistent with hyperextension injury. Minimal distraction of the fracture fragment.  Per RN Neurosurgery has seen the patient today, to keep C collar on, no plans for intervention/surgery, aware of plans for pacing and deferred MRI She has placed a call to them if they would like to get imaging needed done prior to implant      For questions or updates, please contact [facility] Please consult [email or web address] for contact info under     Signed, [place], PA-C  07/29/2019 8:01 AM   I have seen, examined the patient, and reviewed the above assessment and plan.  Changes to above are made where necessary.  On exam, iRRR.  Currently in afib with RVR. The patient has chronic conduction system disease with presyncope in the past.  Now documented to have transient complete heart block with prolonged RR intervals and syncope.    The patient has symptomatic bradycardia.  I would therefore recommend pacemaker implantation at this time.  Risks, benefits, alternatives to pacemaker implantation were discussed in detail with the patient today. The patient understands that the risks include but are not limited to bleeding, infection, pneumothorax, perforation, tamponade, vascular damage, renal failure, MI, stroke, death,  and lead dislodgement and wishes  to proceed. We will therefore schedule the procedure this afternoon.    Co Sign: [name], MD 07/29/2019 10:26 AM

## 2019-07-29 NOTE — Telephone Encounter (Signed)
Informed the patient's brother that the doctors at the hospital communicate with one another and work together very well. Reiterated to him that Dr. Rayann Heman is an expert EP and if he thinks the patient needs a pacemaker, that is the best decision for him.  He is scheduled for PPM at 1500 today. Jerry Foster was grateful for support.   To Dr. Burt Knack as Juluis Rainier on patient status.

## 2019-07-29 NOTE — Progress Notes (Signed)
      [address]       [place] [address]             [phone]        CARDIOTHORACIC SURGERY PROGRESS NOTE   R6 Days Post-Op Procedure(s) (LRB): MINIMALLY INVASIVE AORTIC VALVE REPLACEMENT (AVR) using Edwards INSPIRIS Resilia 21 MM Aortic Valve. (N/A) TRANSESOPHAGEAL ECHOCARDIOGRAM (TEE) (N/A)  Subjective: Another episode CHB last night that was quite prolonged after patient received IV push metoprolol for rapid Afib.  Bedside pacer was hooked up but apparently non-functional.  Patient currently in Afib w/ increased HR.  No complaints other than constipation.  Objective: Vital signs: BP Readings from Last 1 Encounters:  07/29/19 123/85   Pulse Readings from Last 1 Encounters:  07/29/19 (!) 107   Resp Readings from Last 1 Encounters:  07/29/19 (!) 21   Temp Readings from Last 1 Encounters:  07/29/19 98.3 F (36.8 C) (Oral)    Hemodynamics:    Physical Exam:  Rhythm:   Afib  Breath sounds: clear  Heart sounds:  irregular  Incisions:  Clean and dry  Abdomen:  Soft, non-distended, non-tender  Extremities:  Warm, well-perfused   Intake/Output from previous day: 05/05 0701 - 05/06 0700 In: 42.5 [I.V.:33.4; IV Piggyback:9.1] Out: 1100 [Urine:1100] Intake/Output this shift: No intake/output data recorded.  Lab Results:  CBC: Recent Labs    07/27/19 0604 07/29/19 0313  WBC 7.1 7.8  HGB 13.4 13.1  HCT 39.7 38.6*  PLT 89* 134*    BMET:  Recent Labs    07/28/19 2254 07/29/19 0313  NA 136 134*  K 4.0 4.3  CL 101 102  CO2 24 26  GLUCOSE 130* 125*  BUN 14 15  CREATININE 0.72 0.73  CALCIUM 8.4* 8.2*     PT/INR:   Recent Labs    07/29/19 0313  LABPROT 13.3  INR 1.1    CBG (last 3)  Recent Labs    07/27/19 2310 07/28/19 2223 07/28/19 2342  GLUCAP 121* 122* 123*    ABG    Component Value Date/Time   PHART 7.364 07/24/2019 0327   PCO2ART 38.6 07/24/2019 0327   PO2ART 101 07/24/2019 0327   HCO3 22.0 07/24/2019 0327    TCO2 23 07/24/2019 0327   ACIDBASEDEF 3.0 (H) 07/24/2019 0327   O2SAT 98.0 07/24/2019 0327    CXR: Clear w/ small bilateral pleural effusions and mild bibasilar ATX  Assessment/Plan: S/P Procedure(s) (LRB): MINIMALLY INVASIVE AORTIC VALVE REPLACEMENT (AVR) using Edwards INSPIRIS Resilia 21 MM Aortic Valve. (N/A) TRANSESOPHAGEAL ECHOCARDIOGRAM (TEE) (N/A)  Patient has recurrent paroxysmal atrial fibrillation w/ RVR with baseline NSR and RBBB but intermittent CHB w/ severe symptomatic bradycardia when he breaks out of Afib.  He suffered a syncopal episode yesterday afternoon associated with severe laceration of his forehead and minor hyperextension injury to C6.  He needs a permanent pacemaker.  Discussed w/ Dr. Rayann Heman from EP Service.  Tentatively for PPM placement later today.  Continue VVI backup for now.  Consider resuming IV amiodarone once PPM in place.   Await f/u from Neurosurgery team although patient continues to deny any discomfort in his neck.  Unfortunately Dr [place] Circle will not be able to have MRI of C-spine during this admission.   Rexene Alberts, MD 07/29/2019 8:03 AM

## 2019-07-29 NOTE — Progress Notes (Signed)
Polysporin ointment should be applied to eyebrow/browbone of left eye per PA Roddenberry's instructions.

## 2019-07-29 NOTE — Telephone Encounter (Signed)
    Pt's brother Gwyndolyn Saxon called, he said pt is in the hospital and the doctors said he needs a pacemaker put in. Pt is scheduled today at 3:30 pm. Gwyndolyn Saxon said he wasn't sure if Dr. Burt Knack knows about it and would like to know his thoughts about it  Please advise

## 2019-07-29 NOTE — Consult Note (Signed)
Reason for Consult:C6 anterior inferior chip fracture Referring Physician:  Dr. Roxy Manns cardiothoracic  Jermie Doren is an 69 y.o. male.  HPI:  70 year old gentleman who was straining to have a bowel movement and fell off the commode striking his forehead on the floor hyperextending his neck.  Was brought to the ER noted to have it dysrhythmia and CT scans of head neck revealed a small inferior anterior chip fracture off the vertebral body of C6 patient was denying any significant neck pain or any radicular symptoms no numbness and tingling arms or legs.  CT scan of his head was negative.  Past Medical History:  Diagnosis Date  . Aortic stenosis   . Benign prostatic hyperplasia   . Bronchitis   . Cataract    bilateral   . CHF (congestive heart failure) ([place])   . Colon polyps   . Complete heart block ([place]) 07/28/2019  . Heart murmur   . History of fracture of vertebral column   . Hyperlipidemia   . Kidney stone   . Nonrheumatic aortic valve stenosis   . Osteoporosis   . Osteoporosis   . Pure hypercholesterolemia   . RBBB   . 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 Sensorineural hearing loss of both ears   . Thrombosed hemorrhoids    hsitory of  . Tubular adenoma of colon   . Vitamin D deficiency     Past Surgical History:  Procedure Laterality Date  . AORTIC VALVE REPLACEMENT N/A 07/23/2019   Procedure: MINIMALLY INVASIVE AORTIC VALVE REPLACEMENT (AVR) using Margaretha Sheffield Resilia 21 MM Aortic Valve.;  Surgeon: Rexene Alberts, MD;  Location: [facility];  Service: Open Heart Surgery;  Laterality: N/A;  . COLON SURGERY  2006   1 foot ascending colon removed   . COLONOSCOPY    . HERNIA REPAIR    . POLYPECTOMY    . RIGHT HEART CATH AND CORONARY ANGIOGRAPHY N/A 06/08/2019   Procedure: RIGHT HEART CATH AND CORONARY ANGIOGRAPHY;   Surgeon: Sherren Mocha, MD;  Location: [facility];  Service: Cardiovascular;  Laterality: N/A;  . TEE WITHOUT CARDIOVERSION N/A 07/23/2019   Procedure: TRANSESOPHAGEAL ECHOCARDIOGRAM (TEE);  Surgeon: Rexene Alberts, MD;  Location: [facility];  Service: Open Heart Surgery;  Laterality: N/A;  . TESTICLE SURGERY      Family History  Problem Relation Age of Onset  . Colon cancer Neg Hx   . Colon polyps Neg Hx   . Esophageal cancer Neg Hx   . Rectal cancer Neg Hx   . Stomach cancer Neg Hx     Social History:  reports that he has never smoked. He has never used smokeless tobacco. He reports current alcohol use. He reports that he does not use drugs.  Allergies:  Allergies  Allergen Reactions  . Terbinafine Hcl Other (See Comments)    Elevated LFT Lamisil     Medications: I have reviewed the patient's current medications.  Results for orders placed or performed during the hospital encounter of 07/23/19 (from the past 48 hour(s))  Glucose, capillary     Status: Abnormal   Collection Time: 07/27/19 11:16 AM  Result Value Ref Range   Glucose-Capillary 125 (H) 70 - 99 mg/dL    Comment: Glucose reference range applies only to samples taken after fasting for at least 8 hours.  Glucose, capillary     Status:  None   Collection Time: 07/27/19  3:22 PM  Result Value Ref Range   Glucose-Capillary 94 70 - 99 mg/dL    Comment: Glucose reference range applies only to samples taken after fasting for at least 8 hours.  Glucose, capillary     Status: Abnormal   Collection Time: 07/27/19  7:23 PM  Result Value Ref Range   Glucose-Capillary 128 (H) 70 - 99 mg/dL    Comment: Glucose reference range applies only to samples taken after fasting for at least 8 hours.  Glucose, capillary     Status: Abnormal   Collection Time: 07/27/19 11:10 PM  Result Value Ref Range   Glucose-Capillary 121 (H) 70 - 99 mg/dL    Comment: Glucose reference range applies only to samples taken after fasting for at  least 8 hours.  Protime-INR     Status: None   Collection Time: 07/28/19  3:32 AM  Result Value Ref Range   Prothrombin Time 13.6 11.4 - 15.2 seconds   INR 1.1 0.8 - 1.2    Comment: (NOTE) INR goal varies based on device and disease states. Performed at [facility], [place] [address], [place], [place] [id number]   Basic metabolic panel     Status: Abnormal   Collection Time: 07/28/19  3:32 AM  Result Value Ref Range   Sodium 135 135 - 145 mmol/L   Potassium 3.7 3.5 - 5.1 mmol/L   Chloride 100 98 - 111 mmol/L   CO2 25 22 - 32 mmol/L   Glucose, Bld 120 (H) 70 - 99 mg/dL    Comment: Glucose reference range applies only to samples taken after fasting for at least 8 hours.   BUN 15 8 - 23 mg/dL   Creatinine, Ser 0.63 0.61 - 1.24 mg/dL   Calcium 8.2 (L) 8.9 - 10.3 mg/dL   GFR calc non Af Amer >60 >60 mL/min   GFR calc Af Amer >60 >60 mL/min   Anion gap 10 5 - 15    Comment: Performed at [facility] [address], [place], [place] [address]  Magnesium     Status: None   Collection Time: 07/28/19  3:32 AM  Result Value Ref Range   Magnesium 1.8 1.7 - 2.4 mg/dL    Comment: Performed at [facility] [address], [place], [place] [address]  Glucose, capillary     Status: Abnormal   Collection Time: 07/28/19 10:23 PM  Result Value Ref Range   Glucose-Capillary 122 (H) 70 - 99 mg/dL    Comment: Glucose reference range applies only to samples taken after fasting for at least 8 hours.  Basic metabolic panel     Status: Abnormal   Collection Time: 07/28/19 10:54 PM  Result Value Ref Range   Sodium 136 135 - 145 mmol/L   Potassium 4.0 3.5 - 5.1 mmol/L   Chloride 101 98 - 111 mmol/L   CO2 24 22 - 32 mmol/L   Glucose, Bld 130 (H) 70 - 99 mg/dL    Comment: Glucose reference range applies only to samples taken after fasting for at least 8 hours.   BUN 14 8 - 23 mg/dL   Creatinine, Ser 0.72 0.61 - 1.24 mg/dL   Calcium 8.4 (L) 8.9 - 10.3 mg/dL   GFR calc non Af Amer  >60 >60 mL/min   GFR calc Af Amer >60 >60 mL/min   Anion gap 11 5 - 15    Comment: Performed at [facility], [place] [address] [place], [place] [address]  Magnesium     Status: None   Collection Time: 07/28/19 10:54 PM  Result Value Ref Range   Magnesium 1.8 1.7 - 2.4 mg/dL    Comment: Performed at [facility], [place] [address], [place], [place] [address]  Glucose, capillary     Status: Abnormal   Collection Time: 07/28/19 11:42 PM  Result Value Ref Range   Glucose-Capillary 123 (H) 70 - 99 mg/dL    Comment: Glucose reference range applies only to samples taken after fasting for at least 8 hours.  Protime-INR     Status: None   Collection Time: 07/29/19  3:13 AM  Result Value Ref Range   Prothrombin Time 13.3 11.4 - 15.2 seconds   INR 1.1 0.8 - 1.2    Comment: (NOTE) INR goal varies based on device and disease states. Performed at [facility], [place] [address], [place], [place] [id number]   Basic metabolic panel     Status: Abnormal   Collection Time: 07/29/19  3:13 AM  Result Value Ref Range   Sodium 134 (L) 135 - 145 mmol/L   Potassium 4.3 3.5 - 5.1 mmol/L   Chloride 102 98 - 111 mmol/L   CO2 26 22 - 32 mmol/L   Glucose, Bld 125 (H) 70 - 99 mg/dL    Comment: Glucose reference range applies only to samples taken after fasting for at least 8 hours.   BUN 15 8 - 23 mg/dL   Creatinine, Ser 0.73 0.61 - 1.24 mg/dL   Calcium 8.2 (L) 8.9 - 10.3 mg/dL   GFR calc non Af Amer >60 >60 mL/min   GFR calc Af Amer >60 >60 mL/min   Anion gap 6 5 - 15    Comment: Performed at [facility] [address] [place], [place] [address]  Magnesium     Status: None   Collection Time: 07/29/19  3:13 AM  Result Value Ref Range   Magnesium 2.0 1.7 - 2.4 mg/dL    Comment: Performed at [facility] [address] [place], [place] [address]  CBC     Status: Abnormal   Collection Time: 07/29/19  3:13 AM  Result Value Ref Range   WBC 7.8 4.0 - 10.5 K/uL   RBC 4.24 4.22  - 5.81 MIL/uL   Hemoglobin 13.1 13.0 - 17.0 g/dL   HCT 38.6 (L) 39.0 - 52.0 %   MCV 91.0 80.0 - 100.0 fL   MCH 30.9 26.0 - 34.0 pg   MCHC 33.9 30.0 - 36.0 g/dL   RDW 12.9 11.5 - 15.5 %   Platelets 134 (L) 150 - 400 K/uL    Comment: REPEATED TO VERIFY   nRBC 0.0 0.0 - 0.2 %    Comment: Performed at [facility], [place] [address] [place],  [address]    DG Chest 2 View  Result Date: 07/29/2019 CLINICAL DATA:  Postoperative status. EXAM: CHEST - 2 VIEW COMPARISON:  Jul 27, 2019. FINDINGS: Stable cardiomediastinal silhouette. No pneumothorax is noted. Mild bibasilar subsegmental atelectasis is noted with small pleural effusions. Bony thorax is unremarkable. IMPRESSION: Mild bibasilar subsegmental atelectasis with small pleural effusions. Electronically Signed   By: Marijo Conception M.D.   On: 07/29/2019 08:04   CT HEAD WO CONTRAST  Result Date: 07/28/2019 CLINICAL DATA:  [place], anticoagulated, syncope EXAM: CT HEAD WITHOUT CONTRAST TECHNIQUE: Contiguous axial images were obtained from the base of  the skull through the vertex without intravenous contrast. COMPARISON:  None. FINDINGS: Brain: No acute infarct or hemorrhage. Lateral ventricles and midline structures are unremarkable. No acute extra-axial fluid collections. No mass effect. Vascular: No hyperdense vessel or unexpected calcification. Skull: No acute displaced fractures. Sclerosis and calvarial thickening are seen within the right orbital roof and right frontal bone of the calvarium, most compatible with focal Paget disease. Sinuses/Orbits: Sinuses are clear. Other: None. IMPRESSION: 1. No acute intracranial process. 2. 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 No acute bony abnormalities. Electronically Signed   By: Randa Ngo M.D.   On: 07/28/2019 16:23   CT CERVICAL SPINE WO CONTRAST  Result Date: 07/28/2019 CLINICAL DATA:  [place], loss of consciousness, syncope EXAM: CT CERVICAL SPINE WITHOUT CONTRAST  TECHNIQUE: Multidetector CT imaging of the cervical spine was performed without intravenous contrast. Multiplanar CT image reconstructions were also generated. COMPARISON:  None. FINDINGS: Alignment: Alignment is grossly anatomic. Skull base and vertebrae: There is a small displaced fracture through the anterior inferior margin of the C6 vertebral body, best seen on sagittal reconstructed images consistent with hyperextension injury. Minimal distraction at the fracture site. No other acute displaced fractures. Soft tissues and spinal canal: No prevertebral fluid or swelling. No visible canal hematoma. Disc levels: There is mild diffuse spondylosis and facet hypertrophy. Changes are most pronounced at C3/C4, C5/C6, and C6/C7. There is bilateral neural foraminal encroachment at these levels as result of circumferential disc osteophyte complex. Changes are slightly eccentric to the left. Upper chest: Trace right apical pneumothorax is seen, reported on prior chest x-ray. Other: Reconstructed images demonstrate no additional findings. IMPRESSION: 1. Avulsion fracture anterior inferior aspect of the C6 vertebral body as above, consistent with hyperextension injury. Minimal distraction of the fracture fragment. 2. Multilevel cervical spondylosis and facet hypertrophy as above, greatest at C3-4, C5-6, and C6-7. These results were called by telephone at the time of interpretation on 07/28/2019 at 4:37 pm to provider Edward Plainfield , who verbally acknowledged these results. Electronically Signed   By: Randa Ngo M.D.   On: 07/28/2019 16:38    Review of Systems  Musculoskeletal: Positive for neck stiffness.   Blood pressure 123/85, pulse (!) 107, temperature 98.3 F (36.8 C), temperature source Oral, resp. rate (!) 21, height 5\' 9"  (1.753 m), weight 74.6 kg, SpO2 98 %. Physical Exam  Constitutional: He is oriented to person, place, and time.  Neurological: He is alert and oriented to person, place, and time.  GCS eye subscore is 4. GCS verbal subscore is 5. GCS motor subscore is 6.  Patient is awake and alert pupils are equal cranial nerves are intact ecchymosis around the left eye strength 5/5 upper and lower extremities    Assessment/Plan:   69 year old gentleman with small anterior inferior chip fracture of C6 more than likely this is a stable fracture although these are sometimes associated with ligament injury.  I have recommended MRI scan however patient is currently being paced and therefore is unable to get an MRI scan if we can get an MRI we might feel the transition him off the cervical collar however should he require permanent pacing, continue cervical collar for now and we will follow-up as an outpatient with flexion-extension films to clear his neck.  Patient is neurologically intact I see no evidence of disc disruption posterior element involvement.  Kutter Schnepf P 07/29/2019, 8:16 AM

## 2019-07-29 NOTE — Interval H&P Note (Signed)
History and Physical Interval Note:  07/29/2019 10:28 AM  Jerry Foster  has presented today for surgery, with the diagnosis of heart block.  The various methods of treatment have been discussed with the patient and family. After consideration of risks, benefits and other options for treatment, the patient has consented to  Procedure(s): PACEMAKER IMPLANT (N/A) as a surgical intervention.  The patient's history has been reviewed, patient examined, no change in status, stable for surgery.  I have reviewed the patient's chart and labs.  Questions were answered to the patient's satisfaction.     [name]

## 2019-07-30 ENCOUNTER — Inpatient Hospital Stay (HOSPITAL_COMMUNITY): Payer: Medicare Other

## 2019-07-30 LAB — CBC
HCT: 35.7 % — ABNORMAL LOW (ref 39.0–52.0)
Hemoglobin: 12.1 g/dL — ABNORMAL LOW (ref 13.0–17.0)
MCH: 31.3 pg (ref 26.0–34.0)
MCHC: 33.9 g/dL (ref 30.0–36.0)
MCV: 92.5 fL (ref 80.0–100.0)
Platelets: 145 10*3/uL — ABNORMAL LOW (ref 150–400)
RBC: 3.86 MIL/uL — ABNORMAL LOW (ref 4.22–5.81)
RDW: 12.7 % (ref 11.5–15.5)
WBC: 7 10*3/uL (ref 4.0–10.5)
nRBC: 0 % (ref 0.0–0.2)

## 2019-07-30 LAB — BASIC METABOLIC PANEL
Anion gap: 9 (ref 5–15)
BUN: 23 mg/dL (ref 8–23)
CO2: 22 mmol/L (ref 22–32)
Calcium: 8 mg/dL — ABNORMAL LOW (ref 8.9–10.3)
Chloride: 101 mmol/L (ref 98–111)
Creatinine, Ser: 0.62 mg/dL (ref 0.61–1.24)
GFR calc Af Amer: >60 mL/min (ref >60)
GFR calc non Af Amer: >60 mL/min (ref >60)
Glucose, Bld: 111 mg/dL — ABNORMAL HIGH (ref 70–99)
Potassium: 4 mmol/L (ref 3.5–5.1)
Sodium: 132 mmol/L — ABNORMAL LOW (ref 135–145)

## 2019-07-30 LAB — PROTIME-INR
INR: 1.2 (ref 0.8–1.2)
Prothrombin Time: 14.7 seconds (ref 11.4–15.2)

## 2019-07-30 LAB — MAGNESIUM: Magnesium: 1.7 mg/dL (ref 1.7–2.4)

## 2019-07-30 MED ORDER — METOPROLOL TARTRATE 50 MG PO TABS
50.0000 mg | ORAL_TABLET | Freq: Two times a day (BID) | ORAL | Status: DC
  Administered 2019-07-30 – 2019-08-01 (×5): 50 mg via ORAL
  Filled 2019-07-30 (×5): qty 1

## 2019-07-30 MED ORDER — MAGNESIUM SULFATE 4 GM/100ML IV SOLN
4.0000 g | Freq: Once | INTRAVENOUS | Status: AC
  Administered 2019-07-30: 4 g via INTRAVENOUS
  Filled 2019-07-30: qty 100

## 2019-07-30 MED FILL — Fentanyl Citrate Preservative Free (PF) Inj 100 MCG/2ML: INTRAMUSCULAR | Qty: 2 | Status: AC

## 2019-07-30 MED FILL — Heparin Sod (Porcine)-NaCl IV Soln 1000 Unit/500ML-0.9%: INTRAVENOUS | Qty: 500 | Status: AC

## 2019-07-30 NOTE — Progress Notes (Signed)
[id number] Came to see pt to walk. Pt stated he had diarrhea earlier and wiped out now. Stated he will walk about 5pm with staff.  Encouraged IS. Pt knows to not get up by himself. Will follow up tomorrow. Graylon Good RN BSN 07/30/2019 2:55 PM

## 2019-07-30 NOTE — Plan of Care (Signed)
  Problem: Education: Goal: Knowledge of General Education information will improve Description Including pain rating scale, medication(s)/side effects and non-pharmacologic comfort measures Outcome: Progressing   Problem: Health Behavior/Discharge Planning: Goal: Ability to manage health-related needs will improve Outcome: Progressing   

## 2019-07-30 NOTE — Discharge Summary (Signed)
Physician Discharge Summary  Patient ID: Jerry Foster MRN: [id number] DOB/AGE: [birth year] 69 y.o.  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  Patient states that he was first noted to have a murmur on physical exam more than 10 years ago. He denies any known history of rheumatic fever during childhood. He was diagnosed with aortic stenosis and has been followed by his primary care physician with intermittent echocardiograms. Transthoracic echocardiogram performed in 2018 reportedly revealed normal left ventricular systolic function with ejection fraction estimated 65 to 70% and moderate aortic stenosis with peak and mean transvalvular gradient estimated 41 and 23 mmHg respectively, aortic valve area calculated 1.0 cm, and DVI reported 0.37. Follow-up echocardiogram performed in 2020 revealed significant progression of disease with peak and mean transvalvular gradients estimated 71 and 43 mmHg respectively and DVI reported only 0.24. Up until recently the patient has remained asymptomatic. However, the patient recently developed a near syncopal episode while exercising. He was initially evaluated at [facility] where he was told he would likely need surgery within the next 6 months to 2 years. He was referred to Dr.  Burt Knack for a second opinion and underwent diagnostic cardiac catheterization on June 08, 2019 which revealed mild nonobstructive coronary artery disease. Transvalvular gradient across aortic valve not assessed. Right heart pressures were normal. Treatment options were discussed and the patient underwent CT angiography to evaluate the feasibility of transcatheter aortic valve replacement. The patient was referred for surgical consultation.  Patient is single and lives locally in [place]. He does not have children. 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 He exercises on a daily basis. He has a brother who lives in [place] but no other family close by. He is accompanied by a close friend for his office consultation visit today. He states that up until recently he has remained very active physically and not experienced any sort of symptoms. However, he now states that he has noted a tendency to fatigue more easily. He suffered a near syncopal episode while exercising recently while jogging with light weights around his ankles. He states that it took him quite a while to recover. Since then he has had a couple other mild dizzy spells and atypical sharp pains across his chest that seem to come and go sporadically. He denies any significant shortness of breath either with activity or rest. He has not had PND, orthopnea, or lower extremity edema.  Patient was initially seen in consultation on June 21, 2019 at which time we made tentative plans to proceed with surgery on July 22, 2019. He reports no new problems or complaints over the last few weeks and is eager to proceed with surgery as previously planned.   Hospital Course: The patient was admitted electively and on 07/23/2019 he was taken the operating room where he underwent the below described procedure.  He tolerated it well and was taken to the  surgical intensive care unit in stable condition.  Postoperative hospital  course:  The patient has overall made steady progress.  He was weaned from the ventilator using standard protocols without difficulty.  All routine lines, monitors and drainage devices have been discontinued in the standard fashion.  He did develop postoperative atrial fibrillation as well as significant heart block. On 07/28/2019 he had a fall in the bathroom that was felt to be related to the heart block and sustained a significant laceration in the region of his left eyebrow requiring plastic surgery repair. His CT scan was also done at that time and showed an avulsion fracture the anterior inferior aspect of the C6 vertebral body. He was placed in a soft cervical immobilizer. CT scan of the head was unremarkable. Neurosurgery will follow up as an outpatient. He was seen in consultation by Dr. Saintclair Halsted. He is neurologically intact he was seen during the postoperative period by electrophysiology and ultimately it was determined that he would be best suited with permanent pacemaker which was performed by Dr. Rayann Heman.  He did develop some postoperative urinary retention but this was eventually stabilized with initiation of Flomax.  He did have some postoperative volume overload but responded well to Lasix.  He did have a postoperative thrombocytopenia but platelet count has recovered.  Most recent value is 145K.  He has a mild expected acute blood loss anemia which is stable.  Most recent hemoglobin hematocrit are 12.1/35.7 respectively.  Renal function is within normal limits with most recent BUN and creatinine 18/0.69 respectively.  He has been started on Coumadin.  He has had a slow titration and most recent INR on 08/01/2019 is 1.4.  He will be continued on oral Coumadin with INR to be checked in the near future(5/13). Patient is felt to be quite stable at this time for discharge.  Consults: cardiology (EP), neurosurgery, plastic surgery  Significant Diagnostic Studies:   TRANSTHORACIC ECHOCARDIOGRAM  Report  from transthoracic echocardiogram performed at [facility] on January 28, 2019 is reviewed. By report there was normal left ventricular systolic function with ejection fraction estimated 55 to 60%. There was severe aortic stenosis with peak velocity across aortic valve measured 4.2 m/s corresponding to mean transvalvular gradient estimated 43 mmHg and aortic valve area calculated only 0.68 cm. DVI was reported 0.24.  RIGHT HEART CATH AND CORONARY ANGIOGRAPHY  Conclusion  1. Mild nonobstructive CAD without any high-grade or flow-limiting stenoses  2. Bulky aortic valve calcification with restricted leaflet mobility  3. Normal right heart pressures/preserved cardiac output  Plan: CTA studies, cardiac surgical evaluation for consideration of aortic valve replacement  Indications  Severe aortic stenosis [I35.0 (ICD-10-CM)]  Procedural Details  Technical Details INDICATION: Severe symptomatic aortic stenosis (preoperative assessment)  PROCEDURAL DETAILS: There was an indwelling IV in a right antecubital vein. Using normal sterile technique, the IV was changed out for a 5 Fr brachial sheath over a 0.018 inch wire. The right wrist was then prepped, draped, and anesthetized with 1% lidocaine. Using the modified Seldinger technique a 5/6 French Slender sheath was placed in the right radial artery. Intra-arterial verapamil was administered through the radial artery sheath. IV heparin was administered after a JR4 catheter was advanced into the central aorta. A Swan-Ganz catheter was used for the right heart catheterization. Standard protocol was followed for recording of right heart pressures and sampling of oxygen saturations. Fick cardiac output was calculated. Standard Judkins catheters were used for selective coronary angiography. There were no immediate  procedural complications. The patient was transferred to the post catheterization recovery area for further  monitoring.    Estimated blood loss <50 mL.   During this procedure medications were administered to achieve and maintain moderate conscious sedation while the patient's heart rate, blood pressure, and oxygen saturation were continuously monitored and I was present face-to-face 100% of this time.  Medications  (Filter: Administrations occurring from 06/08/19 1242 to 06/08/19 1331)  Heparin (Porcine) in NaCl 1000-0.9 UT/500ML-% SOLN (mL)  Total volume: 1,000 mL  Date/Time  Rate/Dose/Volume Action  06/08/19 1248  500 mL Given  1248  500 mL Given  midazolam (VERSED) injection (mg)  Total dose: 2 mg  Date/Time  Rate/Dose/Volume Action  06/08/19 1305  2 mg Given  fentaNYL (SUBLIMAZE) injection (mcg)  Total dose: 25 mcg  Date/Time  Rate/Dose/Volume Action  06/08/19 1305  25 mcg Given  lidocaine (PF) (XYLOCAINE) 1 % injection (mL)  Total volume: 4 mL  Date/Time  Rate/Dose/Volume Action  06/08/19 1306  2 mL Given  1307  2 mL Given  Radial Cocktail/Verapamil only (mL)  Total volume: 10 mL  Date/Time  Rate/Dose/Volume Action  06/08/19 1307  10 mL Given  heparin injection (Units)  Total dose: 3,500 Units  Date/Time  Rate/Dose/Volume Action  06/08/19 1313  3,500 Units Given  iohexol (OMNIPAQUE) 350 MG/ML injection (mL)  Total volume: 30 mL  Date/Time  Rate/Dose/Volume Action  06/08/19 1323  30 mL Given  Sedation Time  Sedation Time Physician-1: 14 minutes 50 seconds  Contrast  Medication Name Total Dose  iohexol (OMNIPAQUE) 350 MG/ML injection 30 mL  Radiation/Fluoro  Fluoro time: 3 (min)  DAP: 8114 (mGycm2)  Cumulative Air Kerma: 148 (mGy)  Coronary Findings  Diagnostic  Dominance: Right  Left Anterior Descending  Mid LAD lesion 25% stenosed  Mid LAD lesion is 25% stenosed.  First Diagonal Branch  Large first diagonal without significant stenosis  Left Circumflex  The vessel exhibits minimal luminal irregularities. The circumflex supplies a large area of myocardium. The  OM branches are all patent without significant stenoses.  Right Coronary Artery  Ost RCA lesion 30% stenosed  Ost RCA lesion is 30% stenosed. mild nonobstructive ostial RCA stenosis  Intervention  No interventions have been documented.  Left Heart  Aortic Valve There is severe aortic valve stenosis. The aortic valve is calcified. There is restricted aortic valve motion.  Coronary Diagrams  Diagnostic  Dominance: Right   Intervention  Implants     No implant documentation for this case.  Syngo Images  Link to Procedure Log   Show images for CARDIAC CATHETERIZATION Procedure Log  Images on Long Term Storage    Show images for Kopke, N[place] "[name]"   Hemo Data   Most Recent Value  Fick Cardiac Output 7.21 L/min  Fick Cardiac Output Index 3.84 (L/min)/BSA  RA A Wave 10 mmHg  RA V Wave 4 mmHg  RA Mean 2 mmHg  RV Systolic Pressure 32 mmHg  RV Diastolic Pressure 0 mmHg  RV EDP 6 mmHg  PA Systolic Pressure 23 mmHg  PA Diastolic Pressure 7 mmHg  PA Mean 14 mmHg  PW A Wave 22 mmHg  PW V Wave 18 mmHg  PW Mean 13 mmHg  AO Systolic Pressure 123456 mmHg  AO Diastolic Pressure 70 mmHg  AO Mean 90 mmHg  QP/QS 1  TPVR Index 3.65 HRUI  TSVR Index 23.48 HRUI  PVR SVR Ratio 0.01  TPVR/TSVR Ratio 0.16  Cardiac TAVR CT  TECHNIQUE:  The patient  was scanned on a [employer]. A 120 kV  retrospective scan was triggered in the descending thoracic aorta at  111 HU's. Gantry rotation speed was 250 msecs and collimation was .6  mm. No beta blockade or nitro were given. The 3D data set was  reconstructed in 5% intervals of the R-R cycle. Systolic and  diastolic phases were analyzed on a dedicated work station using  MPR, MIP and VRT modes. The patient received 80 cc of contrast.  FINDINGS:  Aortic Valve: Bicuspid aortic valve with severely calcified leaflets  and severely restricted leaflet opening, significant calcifications  are extending into the LVOT. Aortic valve calcium score  3747.  Aorta: Normal size, minimal plaque, no calcifications and no  dissection.  Sinotubular Junction: 25.3 x 24.4 mm  Ascending Thoracic Aorta: 33.2 x 32.4 mm  Aortic Arch: 25.2 x 24.5 mm  Descending Thoracic Aorta: 25.9 x 23.2 mm  Sinus of Valsalva Measurements: 33.7 x 28.6 mm  Coronary Artery Height above Annulus:  Left Main: 13.4 mm  Right Coronary: 19.3 mm  Virtual Basal Annulus Measurements:  Maximum/Minimum Diameter: 27.2 x 22.7 mm  Mean Diameter: 25.0 mm  Perimeter: 80.2 mm  Area: 491 mm2  IMPRESSION:  1. Bicuspid aortic valve with severely calcified leaflets and  severely restricted leaflet opening, significant calcifications are  extending into the LVOT. Aortic valve calcium score 3747.  2. Sufficient coronary to annulus distance.  3. No thrombus in the left atrial appendage.  Electronically Signed  By: Ena Dawley  On: 06/19/2019 01:10  CT ANGIOGRAPHY CHEST, ABDOMEN AND PELVIS  TECHNIQUE:  Multidetector CT imaging through the chest, abdomen and pelvis was  performed using the standard protocol during bolus administration of  intravenous contrast. Multiplanar reconstructed images and MIPs were  obtained and reviewed to evaluate the vascular anatomy.  CONTRAST: 111mL OMNIPAQUE IOHEXOL 350 MG/ML SOLN  COMPARISON: 01/28/2012 CT abdomen/pelvis.  FINDINGS:  CTA CHEST FINDINGS  Cardiovascular: Top-normal heart size. No significant pericardial  effusion/thickening. Coarse calcification and diffuse thickening of  the aortic valve. Minimally atherosclerotic nonaneurysmal thoracic  aorta. Aortic arch branch vessels are patent. Normal caliber  pulmonary arteries. No central pulmonary emboli.  Mediastinum/Nodes: No discrete thyroid nodules. Mildly patulous  thoracic esophagus with small esophageal fluid level. No  pathologically enlarged axillary, mediastinal or hilar lymph nodes.  Lungs/Pleura: No pneumothorax. No pleural effusion. Small  curvilinear parenchymal band  at the peripheral right lung base  compatible with mild scarring. Apical left upper lobe 4 mm solid  pulmonary nodule (series 16/image 19). No acute consolidative  airspace disease, lung masses or additional significant pulmonary  nodules.  Musculoskeletal: No aggressive appearing focal osseous lesions. Mild  thoracic spondylosis.  CTA ABDOMEN AND PELVIS FINDINGS  Hepatobiliary: Normal liver with no liver mass. Normal gallbladder  with no radiopaque cholelithiasis. No biliary ductal dilatation.  Pancreas: Normal, with no mass or duct dilation.  Spleen: Normal size. No mass.  Adrenals/Urinary Tract: Normal adrenals. No hydronephrosis.  Nonobstructing 4 mm lower left renal stone. Simple left renal cysts,  largest 1.9 cm in the upper left kidney. Cyst no additional contour  deforming renal lesions. Normal bladder.  Stomach/Bowel: Normal non-distended stomach. Stable postsurgical  changes in the ileocecal region. No dilated or thick-walled small  bowel loops. Appendix not discretely visualized. No large bowel wall  thickening, diverticulosis or acute pericolonic fat stranding.  Vascular/Lymphatic: Minimally atherosclerotic nonaneurysmal  abdominal aorta. Patent renal and splenic veins. No pathologically  enlarged lymph nodes in the abdomen or pelvis.  Reproductive: Normal size prostate with nonspecific internal  prostatic calcifications.  Other: No pneumoperitoneum, ascites or focal fluid collection.  Musculoskeletal: No aggressive appearing focal osseous lesions. Mild  lumbar spondylosis.  VASCULAR MEASUREMENTS PERTINENT TO TAVR:  AORTA:  Minimal Aortic Diameter-14.1 x 13.5 mm  Severity of Aortic Calcification-mild  RIGHT PELVIS:  Right Common Iliac Artery -  Minimal Diameter-8.7 x 8.7 mm  Tortuosity-mild  Calcification-mild  Right External Iliac Artery -  Minimal Diameter-8.6 x 8.4 mm  Tortuosity-moderate  Calcification-none  Right Common Femoral Artery -  Minimal Diameter-8.1  x 8.0 mm  Tortuosity-mild  Calcification-mild  LEFT PELVIS:  Left Common Iliac Artery -  Minimal Diameter-9.7 x 9.6 mm  Tortuosity-mild  Calcification-mild  Left External Iliac Artery -  Minimal Diameter-8.7 x 8.6 mm  Tortuosity-moderate  Calcification-none  Left Common Femoral Artery -  Minimal Diameter-9.0 x 7.5 mm  Tortuosity-mild  Calcification-mild  Review of the MIP images confirms the above findings.  IMPRESSION:  1. Vascular findings and measurements pertinent to potential TAVR  procedure, as detailed.  2. Coarse calcification and diffuse thickening of the aortic valve,  compatible with the reported clinical history of severe aortic  stenosis.  3. Apical left upper lobe 4 mm solid pulmonary nodule. No follow-up  needed if patient is low-risk. Non-contrast chest CT can be  considered in 12 months if patient is high-risk. This recommendation  follows the consensus statement: Guidelines for Management of  Incidental Pulmonary Nodules Detected on CT Images: From the  Fleischner Society 2017; Radiology 2017; 284:228-243.  4. Mildly patulous thoracic esophagus with small esophageal fluid  level, suggesting esophageal dysmotility and/or gastroesophageal  reflux.  5. Nonobstructing left nephrolithiasis.  6. Aortic Atherosclerosis (ICD10-I70.0).  Electronically Signed  By: Ilona Sorrel M.D.  On: 06/11/2019 12:02      CLINICAL DATA:  [place], loss of consciousness, syncope  EXAM: CT CERVICAL SPINE WITHOUT CONTRAST  TECHNIQUE: Multidetector CT imaging of the cervical spine was performed without intravenous contrast. Multiplanar CT image reconstructions were also generated.  COMPARISON:  None.  FINDINGS: Alignment: Alignment is grossly anatomic.  Skull base and vertebrae: There is a small displaced fracture through the anterior inferior margin of the C6 vertebral body, best seen on sagittal reconstructed images consistent with hyperextension injury. Minimal  distraction at the fracture site.  No other acute displaced fractures.  Soft tissues and spinal canal: No prevertebral fluid or swelling. No visible canal hematoma.  Disc levels: There is mild diffuse spondylosis and facet hypertrophy. Changes are most pronounced at C3/C4, C5/C6, and C6/C7. There is bilateral neural foraminal encroachment at these levels as result of circumferential disc osteophyte complex. Changes are slightly eccentric to the left.  Upper chest: Trace right apical pneumothorax is seen, reported on prior chest x-ray.  Other: Reconstructed images demonstrate no additional findings.  IMPRESSION: 1. Avulsion fracture anterior inferior aspect of the C6 vertebral body as above, consistent with hyperextension injury. Minimal distraction of the fracture fragment. 2. Multilevel cervical spondylosis and facet hypertrophy as above, greatest at C3-4, C5-6, and C6-7.  These results were called by telephone at the time of interpretation on 07/28/2019 at 4:37 pm to provider [facility] , who verbally acknowledged these results.   Electronically Signed   By: Randa Ngo M.D.   On: 07/28/2019 16:38   CLINICAL DATA:  [place], anticoagulated, syncope  EXAM: CT HEAD WITHOUT CONTRAST  TECHNIQUE: Contiguous axial images were obtained from the base of the skull through the vertex without intravenous contrast.  COMPARISON:  None.  FINDINGS: Brain: No acute infarct or hemorrhage. Lateral ventricles and midline structures are unremarkable. No acute extra-axial fluid collections. No mass effect.  Vascular: No hyperdense vessel or unexpected calcification.  Skull: No acute displaced fractures. Sclerosis and calvarial thickening are seen within the right orbital roof and right frontal bone of the calvarium, most compatible with focal Paget disease.  Sinuses/Orbits: Sinuses are clear.  Other: None.  IMPRESSION: 1. No acute intracranial  process. 2. 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 No acute bony abnormalities.   Electronically Signed   By: Randa Ngo M.D.   On: 07/28/2019 16:23  Treatments:   CARDIOTHORACIC SURGERY OPERATIVE NOTE  Date of Procedure:                07/23/2019  Preoperative Diagnosis:      Severe Aortic Stenosis   Postoperative Diagnosis:    Same   Procedure:        Minimally Invasive Aortic Valve Replacement Right Anterior Mini-thoracotomy Edwards Inspiris Resilia Stented Bovine Pericardial Tissue Valve (size 30mm, ref #[id number], serial [id number])              Surgeon:        Valentina Gu. Roxy Manns, MD  Assistant:       Enid Cutter, PA-C  Anesthesia:    Duane Boston, MD  Operative Findings: ? Sievers type 0 bicuspid aortic valve ? Severe aortic stenosis ? Normal LV systolic function ? Moderate-severe LV hypertrophy           BRIEF CLINICAL NOTE AND INDICATIONS FOR SURGERY  Patient is a 69 year old previously healthy male who has been referred for surgical consultation to discuss treatment options for management of severe symptomatic aortic stenosis.  Patient states that he was first noted to have a murmur on physical exam more than 10 years ago. He denies any known history of rheumatic fever during childhood. He was diagnosed with aortic stenosis and has been followed by his primary care physician with intermittent echocardiograms. Transthoracic echocardiogram performed in 2018 reportedly revealed normal left ventricular systolic function with ejection fraction estimated 65 to 70% and moderate aortic stenosis with peak and mean transvalvular gradient estimated 41 and 23 mmHg respectively, aortic valve area calculated 1.0 cm, and DVI reported 0.37. Follow-up echocardiogram performed in 2020 revealed significant progression of disease with peak and mean transvalvular gradients estimated 71 and 43 mmHg respectively and DVI reported  only 0.24. Up until recently the patient has remained asymptomatic. However, the patient recently developed a near syncopal episode while exercising. He was initially evaluated at [facility] where he was told he would likely need surgery within the next 6 months to 2 years. He was referred to Dr. Burt Knack for a second opinion and underwent diagnostic cardiac catheterization on June 08, 2019 which revealed mild nonobstructive coronary artery disease. Transvalvular gradient across aortic valve not assessed. Right heart pressures were normal. Treatment options were discussed and the patient underwent CT angiography to evaluate the feasibility of transcatheter aortic valve replacement. The patient was referred for surgical consultation.  The patient has been seen in consultation and counseled at length regarding the indications, risks and potential benefits of surgery.  All questions have been answered, and the patient provides full informed consent for the operation as described.    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 Left upper extremity venography.   2. Pacemaker implantation.   3. cardioversion    INTRODUCTION:  Jerry Foster is a 69 y.o. male with recent cardiothoracic surgery.  He has had post operative afib with RVR.  He has baseline conduction system disease with presyncope prior to surgery.  Post operatively, he has been found to have mobitz II second degree AV block with transient complete heart block and syncope.  No reversible causes have been identified.  The patient therefore presents today for pacemaker implantation.     DESCRIPTION OF PROCEDURE:  Informed written  consent was obtained, and  the patient was brought to the electrophysiology lab in a fasting state.  He was in afib with RVR upon arrival.  The patient received IV Versed as sedation for the procedure today.  The patients left chest was prepped and draped in the usual sterile fashion by the EP lab staff. The skin overlying the left deltopectoral region was infiltrated with lidocaine for local analgesia.  A 4-cm incision was made over the left deltopectoral region.  A left subcutaneous pacemaker pocket was fashioned using a combination of sharp and blunt dissection. Electrocautery was required to assure hemostasis.    Left Upper Extremity Venography: A venogram of the left upper extremity was performed, which revealed a large left axillary vein, which emptied into a large left subclavian vein.  The left cephalic vein was small in size.    RA/RV Lead Placement: The left axillary vein was therefore cannulated.  A micropuncture technique was used.  Through the left axillary vein, a [employer] Tendril MRI model [id number] (serial number  [id number]) right atrial lead and a [employer] Jude Medical Tendril MRI model [id number]  (serial number  [id number]) right ventricular lead were advanced with fluoroscopic visualization into the right atrial appendage and right ventricular apical septal positions respectively.  Initial atrial lead afib- waves measured 1.2-3.5 mV with impedance of 435 ohm.  Right ventricular lead R-waves measured 9 mV with an impedance of 723 ohms and a threshold of 1 V at 0.5 msec.  Both leads were secured to the pectoralis fascia using #2-0 silk over the suture sleeves.   Device Placement:  The leads were then connected to a [employer]ler MRI model [id number] (serial number  [id number]) pacemaker.  The pocket was irrigated with copious gentamicin solution.  The pacemaker was then placed into the pocket.  The pocket was then closed in 2 layers with 2.0 Vicryl suture for the subcutaneous  and subcuticular layers.  Steri-  Strips and a sterile dressing were then applied. EBL<27ml.  There were no early apparent complications.   Permanent Pacemaker Indication: Documented non-reversible symptomatic bradycardia due to second degree and/or third degree atrioventricular block.   Electrical cardioversion The patient was successfully cardioverted to sinus rhythm with a single synchronized biphasic 360J shock delivered with cardioversion electrodes placed in the anterior/posterior configuration.  He remained in sinus with bigeminal PACs for about 5 minutes with transient episodes of afib and atrial flutter before finally converting to persistent atrial flutter.  There were no early apparent complications.   During this procedure the patient is administered a total of Versed 2 mg to achieve and maintain moderate conscious sedation.  The patient's heart rate, blood pressure, and oxygen saturation are monitored continuously during the procedure. The period of conscious sedation  is 41 minutes, of which I was present face-to-face 100% of this time.     CONCLUSIONS:   1. 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 Successful cardioversion of afib to sinus rhythm,  The patient returned to atrial flutter after about 5 minutes.  No early apparent complications.           [name], MD 07/29/2019 4:42 PM       Discharge Exam: Blood pressure 126/75, pulse 83, temperature 98.4 F (36.9 C), temperature source Oral, resp. rate 15, height 5\' 9"  (1.753 m), weight 74.5 kg, SpO2 99 %.  General appearance: alert, cooperative and no distress Heart: regular rate and rhythm Lungs: clear to auscultation bilaterally Abdomen: benign Extremities: no edema Wound: incis healing well, eye lac healing well Disposition: Discharge disposition: 01-Home or Self  Care       Discharge Instructions    Discharge patient   Complete by: As directed    Discharge disposition: 01-Home or Self Care   Discharge patient date: 08/01/2019     Allergies as of 08/01/2019      Reactions   Terbinafine Hcl Other (See Comments)   Elevated LFT Lamisil       Medication List    TAKE these medications   acetaminophen 500 MG tablet Commonly known as: TYLENOL Take 500 mg by mouth every 8 (eight) hours as needed for moderate pain or headache.   amiodarone 400 MG tablet Commonly known as: PACERONE Take 1 tablet (400 mg total) by mouth 2 (two) times daily. For 7 days, then convert to 400 mg once daily   aspirin 81 MG EC tablet Take 1 tablet (81 mg total) by mouth every 6 (six) hours as needed.   bacitracin-polymyxin b ophthalmic ointment Commonly known as: POLYSPORIN Place 1 application into the left eye 2 (two) times daily. apply to eye every 12 hours while awake   CITRACAL + D PO Take 2 tablets by mouth in the morning and at bedtime.   Dialyvite Vitamin D 5000 125 MCG (5000 UT) capsule Generic drug: Cholecalciferol Take 5,000 Units by mouth daily.   ibuprofen 200 MG tablet Commonly known as: ADVIL Take 400 mg by mouth every 6 (six) hours as needed for moderate pain.   Magnesium 400 MG Caps Take 400 mg by mouth daily.   metoprolol tartrate 50 MG tablet Commonly known as: LOPRESSOR Take 1 tablet (50 mg total) by mouth 2 (two) times daily.   Omega-3 1400 MG Caps Take 1,400 mg by mouth daily.   pravastatin 40 MG tablet Commonly known as: PRAVACHOL Take 40 mg by mouth.   tamsulosin 0.4 MG Caps capsule Commonly known as: FLOMAX Take 1 capsule (0.4 mg total) by mouth daily.   traMADol 50 MG tablet Commonly known as: ULTRAM Take 1 tablet (50 mg total) by mouth every 4 (four) hours as needed for up to 7 days for moderate pain.   warfarin 2.5 MG tablet Commonly known as: COUMADIN Take 1 tablet (2.5 mg total) by mouth daily at 4 PM.       Follow-up Information    [facility] Follow up.   Specialty: Cardiology Why:  Two appointments are scheduled for this office: 1)INR blood test on Thursday, 08/05/19 at 10:30am  2) Pacemaker wound check visit 08/12/2019 @ 12:00PM (noon),  Contact information: [address], [address]  [place] [id number]       [name], MD Follow up.   Specialty: Cardiology Why: 10/29/2019 @ 9:30AM Contact information: [address] [place] [address] [phone]        [name] T, PA-C. Go on 08/18/2019.   Specialties: Cardiology, Physician Assistant Why: Your appointment is at 2:15pm. Contact information: [id number] [place] [address] [phone]        [facility] [place]. Go on 08/16/2019.   Specialty: Cardiothoracic Surgery Why: You have an appointment at 2:30pm.  Please arrive 30 minutes early for a chest X-ray to be performed by [facility] located on the first floor of the same building.  Contact information: [place], [place] [facility] Follow up.   Specialty: Cardiology Why: 08/04/19 @ 9:00AM with C. Fenton, [place].   Contact information: [address] [id number] [place] [phone]          Signed: John Giovanni PA-C 08/01/2019, 8:16 AM

## 2019-07-30 NOTE — Progress Notes (Addendum)
Progress Note  Patient Name: Jerry Foster Date of Encounter: 07/30/2019  Primary Cardiologist: Dr. Burt Knack  Subjective   No CP, no SOB  Inpatient Medications    Scheduled Meds: . amiodarone  400 mg Oral BID  . aspirin EC  81 mg Oral Daily  . bacitracin-polymyxin b  1 application Left Eye BID  . bisacodyl  10 mg Oral Daily   Or  . bisacodyl  10 mg Rectal Daily  . Chlorhexidine Gluconate Cloth  6 each Topical Daily  . docusate sodium  200 mg Oral BID  . metoprolol tartrate  25 mg Oral Q6H  . pantoprazole  40 mg Oral Daily  . pentafluoroprop-tetrafluoroeth   Topical Once  . pravastatin  40 mg Oral Daily  . sodium chloride flush  3 mL Intravenous Q12H  . sodium chloride flush  3 mL Intravenous Q12H  . tamsulosin  0.4 mg Oral Daily  . warfarin  2.5 mg Oral q1600  . Warfarin - Physician Dosing Inpatient   Does not apply q1600   Continuous Infusions: . sodium chloride 250 mL (07/29/19 0951)  .  ceFAZolin (ANCEF) IV Stopped (07/30/19 0344)  . magnesium sulfate bolus IVPB 4 g (07/30/19 0651)   PRN Meds: acetaminophen, HYDROcodone-acetaminophen, lidocaine HCl (PF), lip balm, ondansetron (ZOFRAN) IV, oxyCODONE, traMADol   Vital Signs    Vitals:   07/30/19 0600 07/30/19 0630 07/30/19 0700 07/30/19 0746  BP: 109/88 114/83 (!) 153/74   Pulse:  (!) 122 (!) 117   Resp: (!) 23 20 17    Temp:    98.4 F (36.9 C)  TempSrc:    Oral  SpO2:  97% 95%   Weight:      Height:        Intake/Output Summary (Last 24 hours) at 07/30/2019 0750 Last data filed at 07/30/2019 0651 Gross per 24 hour  Intake 208.5 ml  Output 350 ml  Net -141.5 ml   Last 3 Weights 07/30/2019 07/29/2019 07/28/2019  Weight (lbs) 164 lb 7.4 oz 164 lb 7.4 oz 165 lb 12.6 oz  Weight (kg) 74.6 kg 74.6 kg 75.2 kg      Telemetry    AFib 90's currently, had some tracking last night at upper rate- Personally Reviewed  ECG    Afib 112bpm, som epaced beats, RBBB - Personally Reviewed  Physical Exam   GEN: No acute  distress.   Neck: No JVD Cardiac: irreg-irreg, no murmurs, rubs, or gallops.  Respiratory: CTA b/l. GI: Soft, nontender, non-distended  MS: No edema; No deformity. Neuro:  Nonfocal  Psych: Normal affect   L chest: PPM site dressing is dry, no hematoma, tegaderm remains in place  Labs    High Sensitivity Troponin:  No results for input(s): TROPONINIHS in the last 720 hours.    Chemistry Recent Labs  Lab 07/28/19 2254 07/29/19 0313 07/30/19 0232  NA 136 134* 132*  K 4.0 4.3 4.0  CL 101 102 101  CO2 24 26 22   GLUCOSE 130* 125* 111*  BUN 14 15 23   CREATININE 0.72 0.73 0.62  CALCIUM 8.4* 8.2* 8.0*  GFRNONAA >60 >60 >60  GFRAA >60 >60 >60  ANIONGAP 11 6 9      Hematology Recent Labs  Lab 07/27/19 0604 07/29/19 0313 07/30/19 0232  WBC 7.1 7.8 7.0  RBC 4.33 4.24 3.86*  HGB 13.4 13.1 12.1*  HCT 39.7 38.6* 35.7*  MCV 91.7 91.0 92.5  MCH 30.9 30.9 31.3  MCHC 33.8 33.9 33.9  RDW 12.8 12.9 12.7  PLT 89* 134* 145*    BNPNo results for input(s): BNP, PROBNP in the last 168 hours.   DDimer No results for input(s): DDIMER in the last 168 hours.   Radiology    DG Chest 2 View Result Date: 07/29/2019 CLINICAL DATA:  Postoperative status. EXAM: CHEST - 2 VIEW COMPARISON:  Jul 27, 2019. FINDINGS: Stable cardiomediastinal silhouette. No pneumothorax is noted. Mild bibasilar subsegmental atelectasis is noted with small pleural effusions. Bony thorax is unremarkable. IMPRESSION: Mild bibasilar subsegmental atelectasis with small pleural effusions. Electronically Signed   By: Marijo Conception M.D.   On: 07/29/2019 08:04    CT HEAD WO CONTRAST Result Date: 07/28/2019 CLINICAL DATA:  [place], anticoagulated, syncope EXAM: CT HEAD WITHOUT CONTRAST TECHNIQUE: Contiguous axial images were obtained from the base of the skull through the vertex without intravenous contrast. COMPARISON:  None. FINDINGS: Brain: No acute infarct or hemorrhage. Lateral ventricles and midline structures are  unremarkable. No acute extra-axial fluid collections. No mass effect. Vascular: No hyperdense vessel or unexpected calcification. Skull: No acute displaced fractures. Sclerosis and calvarial thickening are seen within the right orbital roof and right frontal bone of the calvarium, most compatible with focal Paget disease. Sinuses/Orbits: Sinuses are clear. Other: None. IMPRESSION: 1. No acute intracranial process. 2. 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 No acute bony abnormalities. Electronically Signed   By: Randa Ngo M.D.   On: 07/28/2019 16:23    CT CERVICAL SPINE WO CONTRAST Result Date: 07/28/2019 CLINICAL DATA:  [place], loss of consciousness, syncope EXAM: CT CERVICAL SPINE WITHOUT CONTRAST TECHNIQUE: Multidetector CT imaging of the cervical spine was performed without intravenous contrast. Multiplanar CT image reconstructions were also generated. COMPARISON:  None. FINDINGS: Alignment: Alignment is grossly anatomic. Skull base and vertebrae: There is a small displaced fracture through the anterior inferior margin of the C6 vertebral body, best seen on sagittal reconstructed images consistent with hyperextension injury. Minimal distraction at the fracture site. No other acute displaced fractures. Soft tissues and spinal canal: No prevertebral fluid or swelling. No visible canal hematoma. Disc levels: There is mild diffuse spondylosis and facet hypertrophy. Changes are most pronounced at C3/C4, C5/C6, and C6/C7. There is bilateral neural foraminal encroachment at these levels as result of circumferential disc osteophyte complex. Changes are slightly eccentric to the left. Upper chest: Trace right apical pneumothorax is seen, reported on prior chest x-ray. Other: Reconstructed images demonstrate no additional findings. IMPRESSION: 1. Avulsion fracture anterior inferior aspect of the C6 vertebral body as above, consistent with hyperextension injury. Minimal distraction of the  fracture fragment. 2. Multilevel cervical spondylosis and facet hypertrophy as above, greatest at C3-4, C5-6, and C6-7. These results were called by telephone at the time of interpretation on 07/28/2019 at 4:37 pm to provider [facility] , who verbally acknowledged these results. Electronically Signed   By: Randa Ngo M.D.   On: 07/28/2019 16:38     Cardiac Studies    07/23/2019: TEE (interop) POST-OP IMPRESSIONS  Overall, there were no significant changes from pre-bypass.  - Aortic Valve: No regurgitation post repair. The gradient recorded across  the  prosthetic valve is within the expected range, measuring 9 cm/s. No  perivalvular  leak noted.   PRE-OP FINDINGS  Left Ventricle: The left ventricle has normal systolic function, with an  ejection fraction of 60-65%. The cavity size was normal. There is severely  increased left ventricular wall thickness. No evidence of left ventricular  regional wall motion  abnormalities.  Right Ventricle: The right ventricle has normal systolic function. The  cavity was normal. There is no increase in right ventricular wall  thickness.   Left Atrium: Left atrial size was normal in size. The left atrial  appendage is well visualized and there is no evidence of thrombus present.   Right Atrium: Right atrial size was normal in size. Right atrial pressure  is estimated at 10 mmHg.   Interatrial Septum: No atrial level shunt detected by color flow Doppler.   Pericardium: There is no evidence of pericardial effusion.   Mitral Valve: The mitral valve is normal in structure. Mild thickening of  the mitral valve leaflet. Mild calcification of the mitral valve leaflet.  Mitral valve regurgitation is mild by color flow Doppler. The MR jet is  centrally-directed.   Tricuspid Valve: The tricuspid valve was normal in structure. Tricuspid  valve regurgitation is trivial by color flow Doppler. The jet is directed  centrally. The tricuspid  valve is mildly thickened. The tricuspid valve is  mildly calcified.   Aortic Valve: The aortic valve is bicuspid There is severe thickening of  the aortic valve, There is severe calcifcation of the aortic valve and,  with severely decreased cusp excursion. Aortic valve regurgitation is mild  by color flow Doppler. The jet is  eccentric anteriorly directed. There is severe stenosis of the aortic  valve. There is Severe aortic annular calcification noted.   Pulmonic Valve: The pulmonic valve was normal in structure.  Pulmonic valve regurgitation is mild by color flow Doppler.    Aorta: The aortic root and ascending aorta are normal in size and  structure. The aortic arch was not well visualized. There is evidence of  layered immobile plaque in the descending aorta; Grade II, measuring 2-68mm  in size.   06/08/2019: R/LHC 1. Mild nonobstructive CAD without any high-grade or flow-limiting stenoses 2. Bulky aortic valve calcification with restricted leaflet mobility 3. Normal right heart pressures/preserved cardiac output   Patient Profile     69 y.o. male of RBBB, VHD w/AS, HLD  admitted for AVR  Assessment & Plan     1. Tachy-brady syndrome  2. P/o persistent AFib      Prolonged V standstill episodes     Had baseline conduction system disease     Preserved LVEF  S/p PPM yesterday Site is stable Wound care and activity restrictions were reviewed with the patient Routine post pacing follow up is in place device check this morning with intact function, programming changes made to avoid uppr rate tracking/pacing  CXR this AM without ptx  Dr. Rayann Heman has seen and examined the patient this AM  Please leave tegaderm dressing in place until day of discharge, then please remove, leave steri strips in place Please try to avoid rapid increase in INR EP service is happy to help facilitate early follow up in the Afib clinic.  Once you have discharge timing in mind, we happy to help  with this.  We will otherwise sign off, please recall if needed.    For questions or updates, please contact [place] Please consult [email or web address] for contact info under        Signed, Baldwin [place], PA-C  07/30/2019, 7:50 AM     I have seen, examined the patient, and reviewed the above assessment and plan.  Changes to above are made where necessary.  On exam, iRRR. Device interrogation is reviewed and normal.  CXR reveals no PTX/ stable leads,    Anticipate  discharge this weekend with rate control for afib.  Will plan followup in the AF clinic next week.  Co Sign: [name], MD 07/30/2019 12:12 PM

## 2019-07-30 NOTE — Progress Notes (Addendum)
[facility] DAILY ICU PROGRESS NOTE                   [address]            [place] [address]          [phone]   1 Day Post-Op Procedure(s) (LRB): PACEMAKER IMPLANT (N/A)  Total Length of Stay:  LOS: 7 days   Subjective: Awake and alert, no new problems past 24 hours. S/P placement of dual chamber permanent pacemaker yesterday afternoon.  Currently in A flutter with VR ~ 100/min.   Objective: Vital signs in last 24 hours: Temp:  [98 F (36.7 C)-98.3 F (36.8 C)] 98.3 F (36.8 C) (05/06 2300) Pulse Rate:  [0-236] 117 (05/07 0700) Cardiac Rhythm: Sinus tachycardia (05/07 0400) Resp:  [0-43] 17 (05/07 0700) BP: (68-162)/(50-135) 153/74 (05/07 0700) SpO2:  [0 %-100 %] 95 % (05/07 0700) Weight:  [74.6 kg] 74.6 kg (05/07 0500)  Filed Weights   07/28/19 0600 07/29/19 0500 07/30/19 0500  Weight: 75.2 kg 74.6 kg 74.6 kg    Weight change: 0 kg    Intake/Output from previous day: 05/06 0701 - 05/07 0700 In: 208.5 [I.V.:108.5; IV Piggyback:100] Out: 350 [Urine:350]  Intake/Output this shift: No intake/output data recorded.  Current Meds: Scheduled Meds: . amiodarone  400 mg Oral BID  . aspirin EC  81 mg Oral Daily  . bacitracin-polymyxin b  1 application Left Eye BID  . bisacodyl  10 mg Oral Daily   Or  . bisacodyl  10 mg Rectal Daily  . Chlorhexidine Gluconate Cloth  6 each Topical Daily  . docusate sodium  200 mg Oral BID  . metoprolol tartrate  25 mg Oral Q6H  . pantoprazole  40 mg Oral Daily  . pentafluoroprop-tetrafluoroeth   Topical Once  . pravastatin  40 mg Oral Daily  . sodium chloride flush  3 mL Intravenous Q12H  . sodium chloride flush  3 mL Intravenous Q12H  . tamsulosin  0.4 mg Oral Daily  . warfarin  2.5 mg Oral q1600  . Warfarin - Physician Dosing Inpatient   Does not apply q1600   Continuous Infusions: . sodium chloride 250 mL (07/29/19 0951)  .  ceFAZolin (ANCEF) IV Stopped (07/30/19 0344)  . magnesium sulfate bolus IVPB 4  g (07/30/19 0651)   PRN Meds:.acetaminophen, HYDROcodone-acetaminophen, lidocaine HCl (PF), lip balm, ondansetron (ZOFRAN) IV, oxyCODONE, traMADol  General appearance: alert, cooperative and no distress. A soft C-collar is in place.  Neurologic: intact Heart: A flutter, VR ~100/min Lungs: Breath sounds are clear.  Abdomen: Soft, NT.  Extremities: No significant edema.  Wound: The left chest PPM insertion site is covered with a dry occlusive dressing. . The right chest incision is open to air and is dry and intact.  The left eyebrow laceration is well approximated and dry with expected peri-orbital bruising.   Lab Results: CBC: Recent Labs    07/29/19 0313 07/30/19 0232  WBC 7.8 7.0  HGB 13.1 12.1*  HCT 38.6* 35.7*  PLT 134* 145*   BMET:  Recent Labs    07/29/19 0313 07/30/19 0232  NA 134* 132*  K 4.3 4.0  CL 102 101  CO2 26 22  GLUCOSE 125* 111*  BUN 15 23  CREATININE 0.73 0.62  CALCIUM 8.2* 8.0*    CMET: Lab Results  Component Value Date   WBC 7.0 07/30/2019   HGB 12.1 (L) 07/30/2019   HCT 35.7 (L) 07/30/2019   PLT 145 (  L) 07/30/2019   GLUCOSE 111 (H) 07/30/2019   ALT 26 07/19/2019   AST 29 07/19/2019   NA 132 (L) 07/30/2019   K 4.0 07/30/2019   CL 101 07/30/2019   CREATININE 0.62 07/30/2019   BUN 23 07/30/2019   CO2 22 07/30/2019   INR 1.2 07/30/2019   HGBA1C 5.7 (H) 07/19/2019      PT/INR:  Recent Labs    07/30/19 0232  LABPROT 14.7  INR 1.2   Radiology: EP PPM/ICD IMPLANT  Result Date: 07/29/2019 SURGEON:  [name], MD   PREPROCEDURE DIAGNOSIS:  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 Left upper extremity venography.  2. Pacemaker implantation.  3. cardioversion   INTRODUCTION:  Jerry Foster is a 69 y.o. male with recent  cardiothoracic surgery.  He has had post operative afib with RVR.  He has baseline conduction system disease with presyncope prior to surgery.  Post operatively, he has been found to have mobitz II second degree AV block with transient complete heart block and syncope.  No reversible causes have been identified.  The patient therefore presents today for pacemaker implantation.   DESCRIPTION OF PROCEDURE:  Informed written consent was obtained, and  the patient was brought to the electrophysiology lab in a fasting state.  He was in afib with RVR upon arrival.  The patient received IV Versed as sedation for the procedure today.  The patients left chest was prepped and draped in the usual sterile fashion by the EP lab staff. The skin overlying the left deltopectoral region was infiltrated with lidocaine for local analgesia.  A 4-cm incision was made over the left deltopectoral region.  A left subcutaneous pacemaker pocket was fashioned using a combination of sharp and blunt dissection. Electrocautery was required to assure hemostasis.  Left Upper Extremity Venography: A venogram of the left upper extremity was performed, which revealed a large left axillary vein, which emptied into a large left subclavian vein.  The left cephalic vein was small in size.  RA/RV Lead Placement: The left axillary vein was therefore cannulated.  A micropuncture technique was used.  Through the left axillary vein, a St Jude Medical Tendril MRI model LPA1200M- 46 (serial number  [id number]) right atrial lead and a St Jude Medical Tendril MRI model [id number]  (serial number  [id number]) right ventricular lead were advanced with fluoroscopic visualization into the right atrial appendage and right ventricular apical septal positions respectively.  Initial atrial lead afib- waves measured 1.2-3.5 mV with impedance of 435 ohm.  Right ventricular lead R-waves measured 9 mV with an impedance of 723 ohms and a threshold of 1 V at 0.5 msec.  Both  leads were secured to the pectoralis fascia using #2-0 silk over the suture sleeves. Device Placement:  The leads were then connected to a [place] MRI model [id number] (serial number  [id number]) pacemaker.  The pocket was irrigated with copious gentamicin solution.  The pacemaker was then placed into the pocket.  The pocket was then closed in 2 layers with 2.0 Vicryl suture for the subcutaneous and subcuticular layers.  Steri-  Strips and a sterile dressing were then applied. EBL<66ml.  There were no early apparent complications. Permanent Pacemaker Indication: Documented non-reversible symptomatic bradycardia due to second degree and/or third degree atrioventricular block. Electrical cardioversion The patient  was successfully cardioverted to sinus rhythm with a single synchronized biphasic 360J shock delivered with cardioversion electrodes placed in the anterior/posterior configuration.  He remained in sinus with bigeminal PACs for about 5 minutes with transient episodes of afib and atrial flutter before finally converting to persistent atrial flutter.  There were no early apparent complications.  During this procedure the patient is administered a total of Versed 2 mg to achieve and maintain moderate conscious sedation.  The patient's heart rate, blood pressure, and oxygen saturation are monitored continuously during the procedure. The period of conscious sedation is 41 minutes, of which I was present face-to-face 100% of this time.   CONCLUSIONS:  1. 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 Successful cardioversion of afib to sinus rhythm,  The patient returned to atrial flutter after about 5 minutes.  No early apparent complications.       [name], MD 07/29/2019 4:42 PM     Assessment/Plan: S/P Procedure(s) (LRB): PACEMAKER IMPLANT (N/A)  -POD-7MINI  aortic valve replacement for severe AS with bicuspid aortic valve. Post-op atrial fibrillation was treated with amiodarone with initial conversion to SR on POD-4 but he later had an episode of complete heart block with syncope resulting in a fall. Returned to CVICU POD5 after a syncopal episode and fall in the bathroom associated with profound bradycardia and complete heart block.   -POD1 placement of dual chamber PPM by Dr. Rayann Heman. Currently in atrial flutter with CVR. Amiodarone and metoprolol resumed. Will also proceed with anticoagulation with Coumadin.   -BPH with post-op urinary retention- required I&O cath x 1,,now voiding with no difficulty. Continue Flomax.   -Volume Excess-Wt ~3kg + but does not appear volume overloaded clinically. Currently not on Lasix.    -Left eyebrow laceration- POD-2 closure by plastic surgery. Applying polysporin ophth. ointment bid as recommended. The wound is healing with no sing of complication.   --Chip fracture of C6- continue C-collar as recommended by neurosurgery.   -Thrombocytopenia- Plt count recovering  -Disposition- plan transfer back to [facility] today. Continue rehab. Anticipate discharge in 2-3 days.     Antony Odea, PA-C [phone] 07/30/2019 7:33 AM   I have seen and examined the patient and agree with the assessment and plan as outlined.  Rexene Alberts, MD 07/30/2019

## 2019-07-30 NOTE — Progress Notes (Signed)
Pt received to room, assisted to recliner, assessed and monitors applied.  VSS,  CCMD notified, meal delivery updated.  Will con't plan of care.

## 2019-07-31 LAB — BASIC METABOLIC PANEL
Anion gap: 10 (ref 5–15)
BUN: 18 mg/dL (ref 8–23)
CO2: 24 mmol/L (ref 22–32)
Calcium: 8.2 mg/dL — ABNORMAL LOW (ref 8.9–10.3)
Chloride: 99 mmol/L (ref 98–111)
Creatinine, Ser: 0.69 mg/dL (ref 0.61–1.24)
GFR calc Af Amer: >60 mL/min (ref >60)
GFR calc non Af Amer: >60 mL/min (ref >60)
Glucose, Bld: 119 mg/dL — ABNORMAL HIGH (ref 70–99)
Potassium: 3.9 mmol/L (ref 3.5–5.1)
Sodium: 133 mmol/L — ABNORMAL LOW (ref 135–145)

## 2019-07-31 LAB — PROTIME-INR
INR: 1.2 (ref 0.8–1.2)
Prothrombin Time: 15.1 seconds (ref 11.4–15.2)

## 2019-07-31 LAB — MAGNESIUM: Magnesium: 1.9 mg/dL (ref 1.7–2.4)

## 2019-07-31 NOTE — Progress Notes (Signed)
Patient ambulated in hallway with family and rolling walker. Dalina Samara, Bettina Gavia RN

## 2019-07-31 NOTE — Progress Notes (Signed)
Patient has ambulated x 2 today without difficulty.  Instructed to walk 3 times daily, will complete discharge education on Monday.

## 2019-07-31 NOTE — Progress Notes (Signed)
Patient ambulated in hallway with nursing staff and rolling walker 470 feet. Back in room assisted back to bed. Will monitor patient . Blessings Inglett, Bettina Gavia rN

## 2019-07-31 NOTE — Progress Notes (Signed)
Patient ambulated in hallway with family Rasmus Preusser, Bettina Gavia RN

## 2019-07-31 NOTE — Progress Notes (Addendum)
[name]       [name]             [phone]      2 Days Post-Op Procedure(s) (LRB): PACEMAKER IMPLANT (N/A) Subjective: Feels pretty well, didn't sleep well  Objective: Vital signs in last 24 hours: Temp:  [98.1 F (36.7 C)-99.3 F (37.4 C)] 98.4 F (36.9 C) (05/08 0300) Pulse Rate:  [42-106] 68 (05/08 0417) Cardiac Rhythm: Atrial flutter (05/08 0721) Resp:  [14-24] 18 (05/08 0417) BP: (97-127)/(58-88) 117/69 (05/08 0417) SpO2:  [92 %-97 %] 93 % (05/08 0417) Weight:  [74.9 kg] 74.9 kg (05/08 0300)  Hemodynamic parameters for last 24 hours:    Intake/Output from previous day: 05/07 0701 - 05/08 0700 In: 240 [P.O.:240] Out: 500 [Urine:500] Intake/Output this shift: No intake/output data recorded.  General appearance: alert, cooperative and no distress Heart: irreg irreg Lungs: dim in lower fields Abdomen: benign Extremities: no edema Wound: incis healing well, pacer site dressing CDI, eye lac healing well  Lab Results: Recent Labs    07/29/19 0313 07/30/19 0232  WBC 7.8 7.0  HGB 13.1 12.1*  HCT 38.6* 35.7*  PLT 134* 145*   BMET:  Recent Labs    07/30/19 0232 07/31/19 0425  NA 132* 133*  K 4.0 3.9  CL 101 99  CO2 22 24  GLUCOSE 111* 119*  BUN 23 18  CREATININE 0.62 0.69  CALCIUM 8.0* 8.2*    PT/INR:  Recent Labs    07/31/19 0425  LABPROT 15.1  INR 1.2   ABG    Component Value Date/Time   PHART 7.364 07/24/2019 0327   HCO3 22.0 07/24/2019 0327   TCO2 23 07/24/2019 0327   ACIDBASEDEF 3.0 (H) 07/24/2019 0327   O2SAT 98.0 07/24/2019 0327   CBG (last 3)  Recent Labs    07/28/19 2223 07/28/19 2342  GLUCAP 122* 123*    Meds Scheduled Meds: . amiodarone  400 mg Oral BID  . aspirin EC  81 mg Oral Daily  . bacitracin-polymyxin b  1 application Left Eye BID  . bisacodyl  10 mg Oral Daily   Or  . bisacodyl  10 mg Rectal Daily  . Chlorhexidine Gluconate Cloth  6 each Topical Daily  . docusate  sodium  200 mg Oral BID  . metoprolol tartrate  50 mg Oral BID  . pantoprazole  40 mg Oral Daily  . pentafluoroprop-tetrafluoroeth   Topical Once  . pravastatin  40 mg Oral Daily  . sodium chloride flush  3 mL Intravenous Q12H  . sodium chloride flush  3 mL Intravenous Q12H  . tamsulosin  0.4 mg Oral Daily  . warfarin  2.5 mg Oral q1600  . Warfarin - Physician Dosing Inpatient   Does not apply q1600   Continuous Infusions: . sodium chloride 250 mL (07/29/19 0951)   PRN Meds:.acetaminophen, HYDROcodone-acetaminophen, lidocaine HCl (PF), lip balm, ondansetron (ZOFRAN) IV, oxyCODONE, traMADol  Xrays DG Chest 2 View  Result Date: 07/30/2019 CLINICAL DATA:  70 year old male with history of pacemaker placement. EXAM: CHEST - 2 VIEW COMPARISON:  Chest x-ray 07/29/2019. FINDINGS: New left-sided pacemaker device in place with lead tips projecting over the expected location of the right atrium and right ventricle. No pneumothorax. Lung volumes are low. Bibasilar opacities favored to reflect areas of subsegmental atelectasis. Small bilateral pleural effusions. No suspicious appearing pulmonary nodules or masses are noted. No evidence of pulmonary edema. Heart size is borderline enlarged. Upper mediastinal contours  are within normal limits. IMPRESSION: 1. New pacemaker device in place. No pneumothorax or other acute complicating features. 2. Low lung volumes with bibasilar subsegmental atelectasis and small bilateral pleural effusions. Electronically Signed   By: Vinnie Langton M.D.   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 Left upper extremity venography.  2. Pacemaker  implantation.  3. cardioversion   INTRODUCTION:  Jerry Foster is a 69 y.o. male with recent cardiothoracic surgery.  He has had post operative afib with RVR.  He has baseline conduction system disease with presyncope prior to surgery.  Post operatively, he has been found to have mobitz II second degree AV block with transient complete heart block and syncope.  No reversible causes have been identified.  The patient therefore presents today for pacemaker implantation.   DESCRIPTION OF PROCEDURE:  Informed written consent was obtained, and  the patient was brought to the electrophysiology lab in a fasting state.  He was in afib with RVR upon arrival.  The patient received IV Versed as sedation for the procedure today.  The patients left chest was prepped and draped in the usual sterile fashion by the EP lab staff. The skin overlying the left deltopectoral region was infiltrated with lidocaine for local analgesia.  A 4-cm incision was made over the left deltopectoral region.  A left subcutaneous pacemaker pocket was fashioned using a combination of sharp and blunt dissection. Electrocautery was required to assure hemostasis.  Left Upper Extremity Venography: A venogram of the left upper extremity was performed, which revealed a large left axillary vein, which emptied into a large left subclavian vein.  The left cephalic vein was small in size.  RA/RV Lead Placement: The left axillary vein was therefore cannulated.  A micropuncture technique was used.  Through the left axillary vein, a St Jude Medical Tendril MRI model LPA1200M- 46 (serial number  [id number]) right atrial lead and a St Jude Medical Tendril MRI model [id number]  (serial number  [id number]) right ventricular lead were advanced with fluoroscopic visualization into the right atrial appendage and right ventricular apical septal positions respectively.  Initial atrial lead afib- waves measured 1.2-3.5 mV with impedance of 435 ohm.  Right ventricular lead  R-waves measured 9 mV with an impedance of 723 ohms and a threshold of 1 V at 0.5 msec.  Both leads were secured to the pectoralis fascia using #2-0 silk over the suture sleeves. Device Placement:  The leads were then connected to a [place] MRI model [id number] (serial number  [id number]) pacemaker.  The pocket was irrigated with copious gentamicin solution.  The pacemaker was then placed into the pocket.  The pocket was then closed in 2 layers with 2.0 Vicryl suture for the subcutaneous and subcuticular layers.  Steri-  Strips and a sterile dressing were then applied. EBL<74ml.  There were no early apparent complications. Permanent Pacemaker Indication: Documented non-reversible symptomatic bradycardia due to second degree and/or third degree atrioventricular block. Electrical cardioversion The patient was successfully cardioverted to sinus rhythm with a single synchronized biphasic 360J shock delivered with cardioversion electrodes placed in the anterior/posterior configuration.  He remained in sinus with bigeminal PACs for about 5 minutes with transient  episodes of afib and atrial flutter before finally converting to persistent atrial flutter.  There were no early apparent complications.  During this procedure the patient is administered a total of Versed 2 mg to achieve and maintain moderate conscious sedation.  The patient's heart rate, blood pressure, and oxygen saturation are monitored continuously during the procedure. The period of conscious sedation is 41 minutes, of which I was present face-to-face 100% of this time.   CONCLUSIONS:  1. 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 Successful cardioversion of afib to sinus rhythm,  The patient returned to atrial flutter after about 5 minutes.  No early apparent complications.       [name], MD 07/29/2019  4:42 PM    Assessment/Plan: S/P Procedure(s) (LRB): PACEMAKER IMPLANT (N/A)  1 hemodyn stable in aflutter, HR control is pretty good but some episodes into 120's- follow but may need increase in beta blocker 2 sats ok on RA 3 stable renal fxn, good UOP, does not appear volume overloaded 4 INR 1.2- cont slow coumadin load 5 c- collar in place  6 cont rehab/pulm toilet  LOS: 8 days    John Giovanni PA-C Pager [phone] 07/31/2019  I have seen and examined Jerry Foster and agree with the above assessment  and plan.  Grace Isaac MD Beeper [phone] Office [phone] 07/31/2019 1:06 PM

## 2019-07-31 NOTE — Progress Notes (Signed)
Patient ambulated in hallway with family and rolling walker. Jerry Foster, Bettina Gavia RN

## 2019-08-01 ENCOUNTER — Other Ambulatory Visit: Payer: Self-pay | Admitting: Surgical

## 2019-08-01 LAB — PROTIME-INR
INR: 1.4 — ABNORMAL HIGH (ref 0.8–1.2)
Prothrombin Time: 16.1 seconds — ABNORMAL HIGH (ref 11.4–15.2)

## 2019-08-01 MED ORDER — AMIODARONE HCL 400 MG PO TABS: 400.0000 mg | ORAL_TABLET | Freq: Two times a day (BID) | ORAL | 0 refills | Status: DC

## 2019-08-01 MED ORDER — METOPROLOL TARTRATE 50 MG PO TABS: 50.0000 mg | ORAL_TABLET | Freq: Two times a day (BID) | ORAL | 1 refills | Status: DC

## 2019-08-01 MED ORDER — TRAMADOL HCL 50 MG PO TABS: 50.0000 mg | ORAL_TABLET | Freq: Four times a day (QID) | ORAL | 0 refills | Status: DC | PRN

## 2019-08-01 MED ORDER — TAMSULOSIN HCL 0.4 MG PO CAPS: 0.4000 mg | ORAL_CAPSULE | Freq: Every day | ORAL | 1 refills | Status: DC

## 2019-08-01 MED ORDER — AMIODARONE HCL 400 MG PO TABS: 400.0000 mg | ORAL_TABLET | Freq: Two times a day (BID) | ORAL | 1 refills | Status: DC

## 2019-08-01 MED ORDER — WARFARIN SODIUM 2.5 MG PO TABS: 2.5000 mg | ORAL_TABLET | Freq: Every day | ORAL | 1 refills | Status: DC

## 2019-08-01 MED ORDER — BACITRACIN-POLYMYXIN B 500-10000 UNIT/GM OP OINT: 1.0000 "application " | TOPICAL_OINTMENT | Freq: Two times a day (BID) | OPHTHALMIC | 0 refills | Status: DC

## 2019-08-01 MED ORDER — TRAMADOL HCL 50 MG PO TABS: 50.0000 mg | ORAL_TABLET | Freq: Four times a day (QID) | ORAL | 0 refills | Status: AC | PRN

## 2019-08-01 MED ORDER — TRAMADOL HCL 50 MG PO TABS: 50.0000 mg | ORAL_TABLET | ORAL | 0 refills | Status: DC | PRN

## 2019-08-01 MED ORDER — ASPIRIN 81 MG PO TBEC: 81.0000 mg | DELAYED_RELEASE_TABLET | Freq: Four times a day (QID) | ORAL | Status: DC | PRN

## 2019-08-01 MED ORDER — ASPIRIN 81 MG PO TBEC: 81.0000 mg | DELAYED_RELEASE_TABLET | Freq: Every day | ORAL | Status: DC

## 2019-08-01 NOTE — Progress Notes (Signed)
      [address]       [place] [address]             [phone]      3 Days Post-Op Procedure(s) (LRB): PACEMAKER IMPLANT (N/A) Subjective: Feels pretty well , no specific issues  Objective: Vital signs in last 24 hours: Temp:  [97.8 F (36.6 C)-99.1 F (37.3 C)] 98.4 F (36.9 C) (05/09 0453) Pulse Rate:  [64-97] 83 (05/09 0453) Cardiac Rhythm: Normal sinus rhythm (05/09 0015) Resp:  [12-28] 15 (05/09 0453) BP: (118-137)/(71-79) 126/75 (05/09 0453) SpO2:  [92 %-100 %] 99 % (05/09 0453) Weight:  [74.5 kg] 74.5 kg (05/09 0100)  Hemodynamic parameters for last 24 hours:    Intake/Output from previous day: 05/08 0701 - 05/09 0700 In: 600 [P.O.:600] Out: 1500 [Urine:1500] Intake/Output this shift: No intake/output data recorded.  General appearance: alert, cooperative and no distress Heart: regular rate and rhythm Lungs: clear to auscultation bilaterally Abdomen: benign Extremities: no edema Wound: incis healing well, eye lac healing well  Lab Results: Recent Labs    07/30/19 0232  WBC 7.0  HGB 12.1*  HCT 35.7*  PLT 145*   BMET:  Recent Labs    07/30/19 0232 07/31/19 0425  NA 132* 133*  K 4.0 3.9  CL 101 99  CO2 22 24  GLUCOSE 111* 119*  BUN 23 18  CREATININE 0.62 0.69  CALCIUM 8.0* 8.2*    PT/INR:  Recent Labs    08/01/19 0232  LABPROT 16.1*  INR 1.4*   ABG    Component Value Date/Time   PHART 7.364 07/24/2019 0327   HCO3 22.0 07/24/2019 0327   TCO2 23 07/24/2019 0327   ACIDBASEDEF 3.0 (H) 07/24/2019 0327   O2SAT 98.0 07/24/2019 0327   CBG (last 3)  No results for input(s): GLUCAP in the last 72 hours.  Meds Scheduled Meds: . amiodarone  400 mg Oral BID  . aspirin EC  81 mg Oral Daily  . bacitracin-polymyxin b  1 application Left Eye BID  . bisacodyl  10 mg Oral Daily   Or  . bisacodyl  10 mg Rectal Daily  . Chlorhexidine Gluconate Cloth  6 each Topical Daily  . docusate sodium  200 mg Oral BID  .  metoprolol tartrate  50 mg Oral BID  . pantoprazole  40 mg Oral Daily  . pentafluoroprop-tetrafluoroeth   Topical Once  . pravastatin  40 mg Oral Daily  . sodium chloride flush  3 mL Intravenous Q12H  . sodium chloride flush  3 mL Intravenous Q12H  . tamsulosin  0.4 mg Oral Daily  . warfarin  2.5 mg Oral q1600  . Warfarin - Physician Dosing Inpatient   Does not apply q1600   Continuous Infusions: . sodium chloride 250 mL (07/29/19 0951)   PRN Meds:.acetaminophen, HYDROcodone-acetaminophen, lidocaine HCl (PF), lip balm, ondansetron (ZOFRAN) IV, oxyCODONE, traMADol  Xrays No results found.  Assessment/Plan: S/P Procedure(s) (LRB): PACEMAKER IMPLANT (N/A)   1 conts to improve 2 maintaining sinus rhythm 3 sats good on RA 4 INR is 1.4 - cont current coumadin dose 5 no other new labs 6 stable for discharge  LOS: 9 days    John Giovanni PA-C Pager [phone] 08/01/2019

## 2019-08-01 NOTE — Progress Notes (Signed)
CT sutures removed as ordered, steri strips applied. Patient given discharge instructions, medication list and follow up appointments. Patient given paper prescriptions to take to personal pharmacy. All questions were answered. IV and tele were dcd. Will discharge home as ordered. Transported to exit via wheel chair and nursing staff.

## 2019-08-02 ENCOUNTER — Other Ambulatory Visit (HOSPITAL_COMMUNITY): Payer: Self-pay | Admitting: *Deleted

## 2019-08-02 DIAGNOSIS — Z952 Presence of prosthetic heart valve: Secondary | ICD-10-CM

## 2019-08-02 MED FILL — Sodium Bicarbonate IV Soln 8.4%: INTRAVENOUS | Qty: 50 | Status: AC

## 2019-08-02 MED FILL — Mannitol IV Soln 20%: INTRAVENOUS | Qty: 500 | Status: AC

## 2019-08-02 MED FILL — Electrolyte-R (PH 7.4) Solution: INTRAVENOUS | Qty: 4000 | Status: AC

## 2019-08-02 MED FILL — Sodium Chloride IV Soln 0.9%: INTRAVENOUS | Qty: 2000 | Status: AC

## 2019-08-02 MED FILL — Heparin Sodium (Porcine) Inj 1000 Unit/ML: INTRAMUSCULAR | Qty: 10 | Status: AC

## 2019-08-03 NOTE — Progress Notes (Addendum)
Primary Care Physician: Jefm Petty, MD Primary Cardiologist: Dr Burt Knack Primary Electrophysiologist: Dr Rayann Heman Referring Physician: Dr [name] Jerry Foster is a 69 y.o. male with a history of RBBB, AS s/p AVR, HLD, tachybradycardia syndrome, transient CHB s/p PPM, persistent atrial fibrillation, and atrial flutter who presents for follow up in the [facility]. The patient was admitted electively and on 07/23/2019 he was taken the operating room where he underwent AVR.  He did develop postoperative atrial fibrillation as well as significant heart block. On 07/28/2019 he had a fall in the bathroom that was felt to be related to the heart block and sustained a significant laceration in the region of his left eyebrow requiring plastic surgery repair. His CT scan was also done at that time and showed an avulsion fracture the anterior inferior aspect of the C6 vertebral body. He was placed in a soft cervical immobilizer. CT scan of the head was unremarkable. Neurosurgery will follow up as an outpatient. He was seen by Dr Rayann Heman and a PPM was implanted. He has been started on Coumadin with a CHADS2VASC score of 2. He was also started on amiodarone and converted to SR prior to discharge. Patient reports that he has done reasonably well since his hospitalization. He remains in SR today. His concern today is that he is not sleeping well.  Today, he denies symptoms of palpitations, shortness of breath, orthopnea, PND, lower extremity edema, dizziness, presyncope, syncope, snoring, daytime somnolence, bleeding, or neurologic sequela. The patient is tolerating medications without difficulties and is otherwise without complaint today.    Atrial Fibrillation Risk Factors:  he does not have symptoms or diagnosis of sleep apnea.   he has a BMI of Body mass index is 24.54 kg/m.[name] Filed Weights   08/04/19 0921  Weight: 75.4 kg    Family History  Problem Relation Age of Onset    . Colon cancer Neg Hx   . Colon polyps Neg Hx   . Esophageal cancer Neg Hx   . Rectal cancer Neg Hx   . Stomach cancer Neg Hx      Atrial Fibrillation Management history:  Previous antiarrhythmic drugs: amiodarone Previous cardioversions: none Previous ablations: none CHADS2VASC score: 2 Anticoagulation history: warfarin   Past Medical History:  Diagnosis Date  . Aortic stenosis   . Benign prostatic hyperplasia   . Bronchitis   . Cataract    bilateral   . CHF (congestive heart failure) ([place])   . Colon polyps   . Complete heart block ([place]) 07/28/2019  . Heart murmur   . History of fracture of vertebral column   . Hyperlipidemia   . Kidney stone   . Nonrheumatic aortic valve stenosis   . Osteoporosis   . Osteoporosis   . Pure hypercholesterolemia   . RBBB   . 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 Sensorineural hearing loss of both ears   . Thrombosed hemorrhoids    hsitory of  . Tubular adenoma of colon   . Vitamin D deficiency    Past Surgical History:  Procedure Laterality Date  . AORTIC VALVE REPLACEMENT N/A 07/23/2019   Procedure: MINIMALLY INVASIVE AORTIC VALVE REPLACEMENT (AVR) using Margaretha Sheffield Resilia 21 MM Aortic Valve.;  Surgeon: Rexene Alberts, MD;  Location: [facility];  Service: Open Heart Surgery;  Laterality: N/A;  . COLON SURGERY  2006   1 foot ascending colon removed   . COLONOSCOPY    . HERNIA REPAIR    . PACEMAKER IMPLANT N/A 07/29/2019   Procedure: PACEMAKER IMPLANT;  Surgeon: [name], MD;  Location: [facility];  Service: Cardiovascular;  Laterality: N/A;  . POLYPECTOMY    . RIGHT HEART CATH AND CORONARY ANGIOGRAPHY N/A 06/08/2019   Procedure: RIGHT HEART CATH AND CORONARY ANGIOGRAPHY;  Surgeon: Sherren Mocha, MD;  Location: [facility];  Service: Cardiovascular;   Laterality: N/A;  . TEE WITHOUT CARDIOVERSION N/A 07/23/2019   Procedure: TRANSESOPHAGEAL ECHOCARDIOGRAM (TEE);  Surgeon: Rexene Alberts, MD;  Location: [facility];  Service: Open Heart Surgery;  Laterality: N/A;  . TESTICLE SURGERY      Current Outpatient Medications  Medication Sig Dispense Refill  . acetaminophen (TYLENOL) 500 MG tablet Take 500 mg by mouth every 8 (eight) hours as needed for moderate pain or headache.    [name] amiodarone (PACERONE) 400 MG tablet Take 1 tablet (400 mg total) by mouth daily. 70 tablet 1  . aspirin 81 MG EC tablet Take 1 tablet (81 mg total) by mouth daily. 30 tablet   . bacitracin-polymyxin b (POLYSPORIN) ophthalmic ointment Place 1 application into the left eye 2 (two) times daily. apply to eye every 12 hours while awake 3.5 g 0  . Calcium Citrate-Vitamin D (CITRACAL + D PO) Take 2 tablets by mouth in the morning and at bedtime.    . Cholecalciferol (DIALYVITE VITAMIN D 5000) 125 MCG (5000 UT) capsule Take 5,000 Units by mouth daily.    [name] ibuprofen (ADVIL) 200 MG tablet Take 400 mg by mouth every 6 (six) hours as needed for moderate pain.    . Magnesium 400 MG CAPS Take 400 mg by mouth daily.    . metoprolol tartrate (LOPRESSOR) 50 MG tablet Take 1 tablet (50 mg total) by mouth 2 (two) times daily. 60 tablet 1  . Omega-3 1400 MG CAPS Take 1,400 mg by mouth daily.    . pravastatin (PRAVACHOL) 40 MG tablet Take 40 mg by mouth.    . traMADol (ULTRAM) 50 MG tablet Take 1 tablet (50 mg total) by mouth every 6 (six) hours as needed for up to 7 days for moderate pain. 28 tablet 0  . warfarin (COUMADIN) 2.5 MG tablet Take 1 tablet (2.5 mg total) by mouth daily at 4 PM. 100 tablet 1   No current facility-administered medications for this encounter.    Allergies  Allergen Reactions  . Terbinafine Hcl Other (See Comments)    Elevated LFT Lamisil     Social History   Socioeconomic History  . Marital status: Single    Spouse name: Not on file  . Number of  children: Not on file  . Years of education: Not on file  . Highest education level: Not on file  Occupational History  . Not on file  Tobacco Use  . Smoking status: Never Smoker  . Smokeless tobacco: Never Used  Substance and Sexual Activity  . Alcohol use: Yes    Comment: Moderately, 2 bottles wine per week  . Drug use: No  . Sexual activity: Not on file  Other Topics Concern  . Not on file  Social History Narrative  . Not on file   Social Determinants of Health   Financial Resource Strain:   . Difficulty of Paying Living Expenses:   Food Insecurity:   . Worried About Charity fundraiser in the Last Year:   .  Ran Out of Food in the Last Year:   Transportation Needs:   . Film/video editor (Medical):   [name] Lack of Transportation (Non-Medical):   Physical Activity:   . Days of Exercise per Week:   . Minutes of Exercise per Session:   Stress:   . Feeling of Stress :   Social Connections:   . Frequency of Communication with Friends and Family:   . Frequency of Social Gatherings with Friends and Family:   . Attends Religious Services:   . Active Member of Clubs or Organizations:   . Attends Archivist Meetings:   [name] Marital Status:   Intimate Partner Violence:   . Fear of Current or Ex-Partner:   . Emotionally Abused:   [name] Physically Abused:   . Sexually Abused:      ROS- All systems are reviewed and negative except as per the HPI above.  Physical Exam: Vitals:   08/04/19 0921  BP: (!) 142/80  Pulse: 60  Weight: 75.4 kg  Height: 5\' 9"  (1.753 m)    GEN- The patient is well appearing, alert and oriented x 3 today.   Head- normocephalic, atraumatic Eyes-  Sclera clear, conjunctiva pink Ears- hearing intact Oropharynx- clear Neck- soft collar in place Lungs- Clear to ausculation bilaterally, normal work of breathing Heart- Regular rate and rhythm, no murmurs, rubs or gallops  GI- soft, NT, ND, + BS Extremities- no clubbing, cyanosis, or  edema MS- no significant deformity or atrophy Skin- healing laceration over left eye Psych- euthymic mood, full affect Neuro- strength and sensation are intact  Wt Readings from Last 3 Encounters:  08/04/19 75.4 kg  08/01/19 74.5 kg  07/19/19 71.7 kg    EKG today demonstrates A paced rhythm HR 60, 1st degree AV block, RBBB, PR 240, QRS 156, QTc 492  Echo 01/28/19 at [facility] demonstrated  EF 55-60%, severe AS  Epic records are reviewed at length today  CHA2DS2-VASc Score = 2  The patient's score is based upon: CHF History: 1 HTN History: 0 Age : 1 Diabetes History: 0 Stroke History: 0 Vascular Disease History: 0 Gender: 0      ASSESSMENT AND PLAN: 1. Persistent Atrial Fibrillation/atrial flutter The patient's CHA2DS2-VASc score is 2, indicating a 2.2% annual risk of stroke.   General education about afib provided and questions answered.  Patient appears to be maintaining SR.  Will decrease amiodarone to 400 mg daily. Would continue to decrease as he maintains SR. Continue warfarin  Continue Toprol 50 mg BID  2. Secondary Hypercoagulable State (ICD10:  D68.69) The patient is at significant risk for stroke/thromboembolism based upon his CHA2DS2-VASc Score of 2.  Continue Warfarin (Coumadin).   3. Tachybradycardia syndrome/Transient CHB/syncope  S/p PPM, followed by Dr Rayann Heman and the device clinic.  4. Aortic stenosis S/p AVR, followed by structural heart team.   Follow ups for wound check, surgery, cardiology, and Dr Rayann Heman scheduled. AF clinic as needed.    [facility] [address] [place], [place] [address] [phone] 08/04/2019 10:04 AM

## 2019-08-04 ENCOUNTER — Encounter (HOSPITAL_COMMUNITY): Payer: Self-pay | Admitting: Physician Assistant

## 2019-08-04 ENCOUNTER — Telehealth (HOSPITAL_COMMUNITY): Payer: Self-pay

## 2019-08-04 ENCOUNTER — Ambulatory Visit (HOSPITAL_COMMUNITY)
Admission: RE | Admit: 2019-08-04 | Discharge: 2019-08-04 | Disposition: A | Discharge status: Home / Self Care | Payer: Medicare Other | Source: Ambulatory Visit | Attending: Physician Assistant | Admitting: Physician Assistant

## 2019-08-04 ENCOUNTER — Other Ambulatory Visit: Payer: Self-pay

## 2019-08-04 VITALS — BP 142/80 | HR 60 | Ht 69.0 in | Wt 166.2 lb

## 2019-08-04 DIAGNOSIS — D6869 Other thrombophilia: Secondary | ICD-10-CM | POA: Diagnosis not present

## 2019-08-04 DIAGNOSIS — Z950 Presence of cardiac pacemaker: Secondary | ICD-10-CM | POA: Diagnosis not present

## 2019-08-04 DIAGNOSIS — Z952 Presence of prosthetic heart valve: Secondary | ICD-10-CM | POA: Diagnosis not present

## 2019-08-04 DIAGNOSIS — I509 Heart failure, unspecified: Secondary | ICD-10-CM | POA: Insufficient documentation

## 2019-08-04 DIAGNOSIS — I350 Nonrheumatic aortic (valve) stenosis: Secondary | ICD-10-CM | POA: Diagnosis not present

## 2019-08-04 DIAGNOSIS — Z79899 Other long term (current) drug therapy: Secondary | ICD-10-CM | POA: Diagnosis not present

## 2019-08-04 DIAGNOSIS — I4819 Other persistent atrial fibrillation: Secondary | ICD-10-CM | POA: Diagnosis present

## 2019-08-04 DIAGNOSIS — E785 Hyperlipidemia, unspecified: Secondary | ICD-10-CM | POA: Diagnosis not present

## 2019-08-04 DIAGNOSIS — Z7901 Long term (current) use of anticoagulants: Secondary | ICD-10-CM | POA: Diagnosis not present

## 2019-08-04 DIAGNOSIS — I442 Atrioventricular block, complete: Secondary | ICD-10-CM | POA: Diagnosis not present

## 2019-08-04 DIAGNOSIS — I4892 Unspecified atrial flutter: Secondary | ICD-10-CM | POA: Diagnosis not present

## 2019-08-04 DIAGNOSIS — I495 Sick sinus syndrome: Secondary | ICD-10-CM | POA: Diagnosis not present

## 2019-08-04 DIAGNOSIS — I4510 Unspecified right bundle-branch block: Secondary | ICD-10-CM | POA: Insufficient documentation

## 2019-08-04 DIAGNOSIS — Z7982 Long term (current) use of aspirin: Secondary | ICD-10-CM | POA: Insufficient documentation

## 2019-08-04 DIAGNOSIS — Z888 Allergy status to other drugs, medicaments and biological substances status: Secondary | ICD-10-CM | POA: Insufficient documentation

## 2019-08-04 MED ORDER — AMIODARONE HCL 400 MG PO TABS: 400.0000 mg | ORAL_TABLET | Freq: Every day | ORAL | 1 refills | Status: DC

## 2019-08-04 NOTE — Telephone Encounter (Signed)
Attempted to call patient in regards to Cardiac Rehab - LM on VM 

## 2019-08-04 NOTE — Telephone Encounter (Signed)
Pt insurance is active and benefits verified through [facility]. Co-pay $20.00, DED $0.00/$0.00 met, out of pocket $3,900.00/$355.79 met, co-insurance 0%. No pre-authorization required. M[employer] L[employer], 08/04/19 @ 254PM, REF# [id number]  Will contact patient to see if he is interested in the Cardiac Rehab Program. If interested, patient will need to complete follow up appt. Once completed, patient will be contacted for scheduling upon review by the RN Navigator.

## 2019-08-04 NOTE — Patient Instructions (Signed)
Decrease Amiodarone to 400mg  once a day

## 2019-08-05 ENCOUNTER — Telehealth: Payer: Self-pay | Admitting: Surgical

## 2019-08-05 ENCOUNTER — Ambulatory Visit (INDEPENDENT_AMBULATORY_CARE_PROVIDER_SITE_OTHER): Payer: Medicare Other | Admitting: *Deleted

## 2019-08-05 DIAGNOSIS — I4891 Unspecified atrial fibrillation: Secondary | ICD-10-CM | POA: Diagnosis not present

## 2019-08-05 DIAGNOSIS — Z953 Presence of xenogenic heart valve: Secondary | ICD-10-CM | POA: Diagnosis not present

## 2019-08-05 DIAGNOSIS — I4819 Other persistent atrial fibrillation: Secondary | ICD-10-CM

## 2019-08-05 DIAGNOSIS — Z5181 Encounter for therapeutic drug level monitoring: Secondary | ICD-10-CM

## 2019-08-05 LAB — POCT INR: INR: 1.5 — AB (ref 2.0–3.0)

## 2019-08-05 NOTE — Telephone Encounter (Signed)
Called patient to discuss L eyebrow laceration and post-procedure follow up. No answer, left VM to return call and further discuss.

## 2019-08-05 NOTE — Patient Instructions (Addendum)
Description   Take 2 tablets today and tomorrow and then start taking taking 1 tablet daily except for 2 tablets on Sundays, Tuesdays and Thursdays. Recheck INR in 1 week. Call coumadin clinic for any changes in medications or upcoming procedures, [phone]     A full discussion of the nature of anticoagulants has been carried out.  A benefit risk analysis has been presented to the patient, so that they understand the justification for choosing anticoagulation at this time. The need for frequent and regular monitoring, precise dosage adjustment and compliance is stressed.  Side effects of potential bleeding are discussed.  The patient should avoid any OTC items containing aspirin or ibuprofen, and should avoid great swings in general diet.  Avoid alcohol consumption.  Call if any signs of abnormal bleeding.

## 2019-08-08 NOTE — Addendum Note (Signed)
Encounter addended by: Oliver Barre, PA on: 08/08/2019 8:09 PM  Actions taken: Clinical Note Signed

## 2019-08-09 ENCOUNTER — Telehealth (HOSPITAL_COMMUNITY): Payer: Self-pay

## 2019-08-09 ENCOUNTER — Ambulatory Visit (INDEPENDENT_AMBULATORY_CARE_PROVIDER_SITE_OTHER): Payer: Medicare Other | Admitting: Surgical

## 2019-08-09 ENCOUNTER — Encounter: Payer: Self-pay | Admitting: Surgical

## 2019-08-09 ENCOUNTER — Other Ambulatory Visit: Payer: Self-pay

## 2019-08-09 VITALS — BP 121/62 | HR 43 | Temp 97.3°F | Ht 69.0 in | Wt 156.0 lb

## 2019-08-09 DIAGNOSIS — S01112D Laceration without foreign body of left eyelid and periocular area, subsequent encounter: Secondary | ICD-10-CM

## 2019-08-09 NOTE — Telephone Encounter (Signed)
Pt returned CR phone call and stated he is interested in CR. Explained scheduling process and went over insurance, patient verbalized understanding. Will contact patient for scheduling once f/u has been completed.  

## 2019-08-09 NOTE — Progress Notes (Signed)
Patient is a 69 year old male here for follow-up after repair of left eyebrow laceration while hospitalized.  Patient had fallen while hospitalized and sustained a left eyebrow laceration and a laceration of his left cheek just inferior to the left eye.  He is here today for evaluation of left eyebrow laceration after repair.  He reports that he is overall doing well, no issues with his vision.  He reports he is now ambulating with a cane, but is able to navigate at home without any assistance via cane or walker.  BP 121/62   Pulse (!) 43   Temp (!) 97.3 F (36.3 C) (Temporal)   Ht 5\' 9"  (1.753 m)   Wt 156 lb (70.8 kg)   SpO2 100%   BMI 23.04 kg/m  On exam left eyebrow laceration is healing nicely, Monocryl suture knots in place.  Left cheek laceration is also healing well, Dermabond in place.  No periincisional erythema over either incision, no drainage noted, no purulence.  No fluid collections noted. Some residual bruising noted over inferior eyelid. EOMs grossly intact  Suture knots removed today from left eyebrow laceration, Dermabond removed from left cheek laceration.  Recommend wearing sunscreen when out in the sun to prevent discoloration of eyebrow laceration.  Follow-up as needed, call w/ questions or concerns.  Pictures were obtained of the patient and placed in the chart with the patient's or guardian's permission.

## 2019-08-09 NOTE — Addendum Note (Signed)
Addended byRoetta Sessions on: 08/09/2019 03:13 PM   Modules accepted: Level of Service

## 2019-08-10 ENCOUNTER — Ambulatory Visit: Payer: Medicare Other | Admitting: Surgical

## 2019-08-12 ENCOUNTER — Other Ambulatory Visit: Payer: Self-pay

## 2019-08-12 ENCOUNTER — Ambulatory Visit (INDEPENDENT_AMBULATORY_CARE_PROVIDER_SITE_OTHER): Payer: Medicare Other | Admitting: Emergency Medicine

## 2019-08-12 ENCOUNTER — Ambulatory Visit: Payer: Medicare Other | Admitting: *Deleted

## 2019-08-12 DIAGNOSIS — I4819 Other persistent atrial fibrillation: Secondary | ICD-10-CM | POA: Diagnosis not present

## 2019-08-12 DIAGNOSIS — Z953 Presence of xenogenic heart valve: Secondary | ICD-10-CM | POA: Diagnosis not present

## 2019-08-12 DIAGNOSIS — Z5181 Encounter for therapeutic drug level monitoring: Secondary | ICD-10-CM | POA: Diagnosis not present

## 2019-08-12 DIAGNOSIS — I4891 Unspecified atrial fibrillation: Secondary | ICD-10-CM

## 2019-08-12 DIAGNOSIS — I442 Atrioventricular block, complete: Secondary | ICD-10-CM

## 2019-08-12 LAB — POCT INR: INR: 3.3 — AB (ref 2.0–3.0)

## 2019-08-12 NOTE — Patient Instructions (Addendum)
Description   Hold warfarin tomorrow, then continue to take 1 tablet daily excepet for 2 tablets on Sunday, Tuesday and Thursdays, Recheck INR in 1 week. Pt on amio 400mg  daily.  Call coumadin clinic for any changes in medications or upcoming procedures, [phone]

## 2019-08-13 LAB — CUP PACEART INCLINIC DEVICE CHECK
Battery Remaining Longevity: 127 mo
Battery Voltage: 3.05 V
Brady Statistic RA Percent Paced: 17 %
Brady Statistic RV Percent Paced: 1.8 %
Date Time Interrogation Session: 20210520122800
Implantable Lead Implant Date: 20210506
Implantable Lead Implant Date: 20210506
Implantable Lead Location: 753859
Implantable Lead Location: 753860
Implantable Pulse Generator Implant Date: 20210506
Lead Channel Impedance Value: 462.5 Ohm
Lead Channel Impedance Value: 562.5 Ohm
Lead Channel Pacing Threshold Amplitude: 0.5 V
Lead Channel Pacing Threshold Amplitude: 0.5 V
Lead Channel Pacing Threshold Pulse Width: 0.4 ms
Lead Channel Pacing Threshold Pulse Width: 0.4 ms
Lead Channel Sensing Intrinsic Amplitude: 1.6 mV
Lead Channel Sensing Intrinsic Amplitude: 12 mV
Lead Channel Setting Pacing Amplitude: 3.5 V
Lead Channel Setting Pacing Amplitude: 3.5 V
Lead Channel Setting Pacing Pulse Width: 0.4 ms
Lead Channel Setting Sensing Sensitivity: 2 mV
Pulse Gen Model: 2272
Pulse Gen Serial Number: 3825759

## 2019-08-13 NOTE — Progress Notes (Signed)
Wound check appointment. Steri-strips removed. Wound without redness or edema. Incision edges approximated, wound well healed. Normal device function. Thresholds, sensing, and impedances consistent with implant measurements. Device programmed at 3.5V/auto capture programmed on for extra safety margin until 3 month visit. Histogram distribution appropriate for patient and level of activity.  128 AMS entries with 25 EGMS that show AT/AFL episodes with longest episode lasted 1 minute and No high ventricular rates noted. Patient educated about wound care, arm mobility, lifting restrictions. ROV with Dr Rayann Heman on 11/08/19.Next remote transmission scheduled for 10/29/19 and every 3 months after.

## 2019-08-16 ENCOUNTER — Ambulatory Visit: Payer: Medicare Other

## 2019-08-16 ENCOUNTER — Other Ambulatory Visit: Payer: Self-pay | Admitting: Thoracic Surgery (Cardiothoracic Vascular Surgery)

## 2019-08-16 DIAGNOSIS — Z952 Presence of prosthetic heart valve: Secondary | ICD-10-CM

## 2019-08-17 ENCOUNTER — Ambulatory Visit
Admission: RE | Admit: 2019-08-17 | Discharge: 2019-08-17 | Disposition: A | Discharge status: Home / Self Care | Payer: Medicare Other | Source: Ambulatory Visit | Attending: Thoracic Surgery (Cardiothoracic Vascular Surgery) | Admitting: Thoracic Surgery (Cardiothoracic Vascular Surgery)

## 2019-08-17 ENCOUNTER — Ambulatory Visit (INDEPENDENT_AMBULATORY_CARE_PROVIDER_SITE_OTHER): Payer: Self-pay | Admitting: Physician Assistant

## 2019-08-17 ENCOUNTER — Other Ambulatory Visit: Payer: Self-pay

## 2019-08-17 VITALS — BP 123/77 | HR 63 | Temp 97.8°F | Resp 20 | Ht 69.0 in

## 2019-08-17 DIAGNOSIS — Z952 Presence of prosthetic heart valve: Secondary | ICD-10-CM

## 2019-08-17 DIAGNOSIS — I350 Nonrheumatic aortic (valve) stenosis: Secondary | ICD-10-CM

## 2019-08-18 ENCOUNTER — Encounter: Payer: Self-pay | Admitting: Physician Assistant

## 2019-08-18 ENCOUNTER — Ambulatory Visit: Payer: Medicare Other | Admitting: Physician Assistant

## 2019-08-18 ENCOUNTER — Ambulatory Visit (INDEPENDENT_AMBULATORY_CARE_PROVIDER_SITE_OTHER): Payer: Medicare Other | Admitting: Pharmacist

## 2019-08-18 VITALS — BP 132/80 | HR 60 | Ht 69.0 in | Wt 156.4 lb

## 2019-08-18 DIAGNOSIS — I4819 Other persistent atrial fibrillation: Secondary | ICD-10-CM | POA: Diagnosis not present

## 2019-08-18 DIAGNOSIS — I442 Atrioventricular block, complete: Secondary | ICD-10-CM

## 2019-08-18 DIAGNOSIS — S12591D Other nondisplaced fracture of sixth cervical vertebra, subsequent encounter for fracture with routine healing: Secondary | ICD-10-CM

## 2019-08-18 DIAGNOSIS — I2510 Atherosclerotic heart disease of native coronary artery without angina pectoris: Secondary | ICD-10-CM | POA: Diagnosis not present

## 2019-08-18 DIAGNOSIS — I350 Nonrheumatic aortic (valve) stenosis: Secondary | ICD-10-CM

## 2019-08-18 DIAGNOSIS — Z953 Presence of xenogenic heart valve: Secondary | ICD-10-CM | POA: Diagnosis not present

## 2019-08-18 DIAGNOSIS — Z950 Presence of cardiac pacemaker: Secondary | ICD-10-CM

## 2019-08-18 DIAGNOSIS — I4891 Unspecified atrial fibrillation: Secondary | ICD-10-CM

## 2019-08-18 LAB — POCT INR: INR: 4 — AB (ref 2.0–3.0)

## 2019-08-18 MED ORDER — AMIODARONE HCL 200 MG PO TABS: 200.0000 mg | ORAL_TABLET | Freq: Every day | ORAL | 1 refills | Status: DC

## 2019-08-18 NOTE — Progress Notes (Signed)
HPI: Patient returns for routine postoperative follow-up having undergone MI AVR on 4/30/201. The patient's early postoperative recovery while in the hospital was notable for development of complete heart block.  He actually suffered a fall resulting in laceration to his left eyelid.  This was repaired by plastic surgery.  His rhythm did not recover and he required placement of a permanent pacemaker. Since hospital discharge the patient reports he is doing okay.  He states he just wasn't prepared for having a pacemaker post operatively.  He states he walks several times per day without difficulty.  He does have some fatigue at times and really noticed it after going out to dinner with some friends the other evening and sitting in the heat.  His incisions are healing without evidence of infection.     Current Outpatient Medications  Medication Sig Dispense Refill  . acetaminophen (TYLENOL) 500 MG tablet Take 500 mg by mouth every 8 (eight) hours as needed for moderate pain or headache.    [name] amiodarone (PACERONE) 400 MG tablet Take 1 tablet (400 mg total) by mouth daily. 70 tablet 1  . aspirin 81 MG EC tablet Take 1 tablet (81 mg total) by mouth daily. 30 tablet   . bacitracin-polymyxin b (POLYSPORIN) ophthalmic ointment Place 1 application into the left eye 2 (two) times daily. apply to eye every 12 hours while awake 3.5 g 0  . Calcium Citrate-Vitamin D (CITRACAL + D PO) Take 2 tablets by mouth in the morning and at bedtime.    . Cholecalciferol (DIALYVITE VITAMIN D 5000) 125 MCG (5000 UT) capsule Take 5,000 Units by mouth daily.    [name] ibuprofen (ADVIL) 200 MG tablet Take 400 mg by mouth every 6 (six) hours as needed for moderate pain.    . Magnesium 400 MG CAPS Take 400 mg by mouth daily.    . metoprolol tartrate (LOPRESSOR) 50 MG tablet Take 1 tablet (50 mg total) by mouth 2 (two) times daily. 60 tablet 1  . Omega-3 1400 MG CAPS Take 1,400 mg by mouth daily.    . pravastatin (PRAVACHOL) 40 MG tablet  Take 40 mg by mouth.    . warfarin (COUMADIN) 2.5 MG tablet Take 1 tablet (2.5 mg total) by mouth daily at 4 PM. 100 tablet 1   No current facility-administered medications for this visit.    Physical Exam:  BP 123/77   Pulse 63   Temp 97.8 F (36.6 C) (Skin)   Resp 20   Ht 5\' 9"  (1.753 m)   SpO2 99% Comment: RA  BMI 23.04 kg/m   Gen: no apparent distress Heart: RRR Lung: CTA Ext: no edema Incisions: healing w/o evidence of infection  Diagnostic Tests:  CXR; no pleural effusions, no pneumothorax, PPM in place  A/P:  1. S/P MI AVR, PPM placement- patient is doing very well, incisions are healing without evidence of infection 2. Activity- patient walking several times per day, he is increasing his activity as able, he has started to drive short distances around town, advised patient on being outside in the heat as he will likely feel pretty tired 3. RTC in 3 months with Dr. Geraldo Docker, PA-C [facility] [phone]

## 2019-08-18 NOTE — Patient Instructions (Signed)
Hold warfarin today, then start taking 1 tablet daily excepet for 2 tablets on Sundays. Recheck INR in 1 week. Pt on amio 400mg  daily.  Call coumadin clinic for any changes in medications or upcoming procedures, [phone]

## 2019-08-18 NOTE — Progress Notes (Signed)
Cardiology Office Note:    Date:  08/18/2019   ID:  Jerry Foster, DOB 1950-12-26, MRN [id number]  PCP:  Jerry Petty, MD  Cardiologist:  Jerry Mocha, MD   Electrophysiologist:  Jerry Grayer, MD   Referring MD: Jerry Petty, MD   Chief Complaint:  Hospitalization Follow-up (s/p AVR)    Patient Profile:    Jerry Foster is a 69 y.o. male with:   Aortic stenosis  S/p min inv bioprosthetic AVR 06/2019  Coronary artery disease    Mild, non-obs dz by cath 05/2019  Persistent, post op atrial fibrillation   Amiodarone  CHA2DS2-VASc=2 (CAD, age x 1) >> Warfarin  Mobitz II AV block  S/p Pacemaker   Hyperlipidemia  RBBB  Prior CV studies: Pre-CABG Dopplers 07/19/2019 Bilateral ICA 1-39  Cardiac catheterization 06/08/2019 LAD mid 25 LCx minimal irregularities RCA ostial 30   History of Present Illness:    Jerry Foster was admitted 4/30-5/9 and underwent minimally invasive bioprosthetic aortic valve replacement.  His postoperative course was complicated by atrial fibrillation as well as high-grade heart block.  He suffered a fall with a laceration to his left eyebrow requiring plastic surgery repair.  He was also noted to have an avulsion fracture of C6 on CT scan and was placed in a cervical immobilizer.  He ultimately underwent pacemaker implantation by Jerry Foster.  He was given furosemide for volume excess and he had transient thrombocytopenia.  He was placed on Warfarin for anticoagulation and Amiodarone.  He converted to normal sinus rhythm prior to DC.  He was seen in follow-up in the atrial fibrillation clinic 08/04/2019.  He was also seen yesterday by Jerry Handler, PA-C at Dr. Guy Foster office for post op follow up.    He returns for Cardiology follow up.  Overall, he is doing well.  He is still coming to grips with the fact that he has a pacemaker.  He has not had shortness of breath.  He feels better sleeping on an incline.  He has not had syncope.  His appetite is  good.  He would like to start cardiac rehabilitation.  He already has an appointment for an echocardiogram next month.  His chest is still somewhat sore but improving.  Past Medical History:  Diagnosis Date  . Aortic stenosis   . Benign prostatic hyperplasia   . Bronchitis   . Cataract    bilateral   . CHF (congestive heart failure) ([place])   . Colon polyps   . Complete heart block ([place]) 07/28/2019  . Heart murmur   . History of fracture of vertebral column   . Hyperlipidemia   . Kidney stone   . Nonrheumatic aortic valve stenosis   . Osteoporosis   . Osteoporosis   . Pure hypercholesterolemia   . RBBB   . 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 Sensorineural hearing loss of both ears   . Thrombosed hemorrhoids    hsitory of  . Tubular adenoma of colon   . Vitamin D deficiency     Current Medications: Current Meds  Medication Sig  . acetaminophen (TYLENOL) 500 MG tablet Take 500 mg by mouth every 8 (eight) hours as needed for moderate pain or headache.  [name] aspirin 81 MG EC tablet Take 1 tablet (81 mg total) by mouth daily.  . Calcium Citrate-Vitamin D (CITRACAL + D PO) Take 2 tablets by mouth  in the morning and at bedtime.  . Cholecalciferol (DIALYVITE VITAMIN D 5000) 125 MCG (5000 UT) capsule Take 5,000 Units by mouth daily.  [name] ibuprofen (ADVIL) 200 MG tablet Take 400 mg by mouth every 6 (six) hours as needed for moderate pain.  . Magnesium 400 MG CAPS Take 400 mg by mouth daily.  . metoprolol tartrate (LOPRESSOR) 50 MG tablet Take 1 tablet (50 mg total) by mouth 2 (two) times daily.  . Omega-3 1400 MG CAPS Take 1,400 mg by mouth daily.  . pravastatin (PRAVACHOL) 40 MG tablet Take 40 mg by mouth.  . warfarin (COUMADIN) 2.5 MG tablet Take 1 tablet (2.5 mg total) by mouth daily at 4 PM.  . [DISCONTINUED] amiodarone (PACERONE)  400 MG tablet Take 1 tablet (400 mg total) by mouth daily.     Allergies:   Terbinafine hcl   Social History   Tobacco Use  . Smoking status: Never Smoker  . Smokeless tobacco: Never Used  Substance Use Topics  . Alcohol use: Yes    Comment: Moderately, 2 bottles wine per week  . Drug use: No     Family Hx: The patient's family history is negative for Colon cancer, Colon polyps, Esophageal cancer, Rectal cancer, and Stomach cancer.  ROS   EKGs/Labs/Other Test Reviewed:    EKG:  EKG is  ordered today.  The ekg ordered today demonstrates atrial paced, HR 60, normal axis, right bundle branch block, nonspecific ST-T wave changes, QTC 474  Recent Labs: 07/19/2019: ALT 26 07/30/2019: Hemoglobin 12.1; Platelets 145 07/31/2019: BUN 18; Creatinine, Ser 0.69; Magnesium 1.9; Potassium 3.9; Sodium 133   Recent Lipid Panel No results found for: CHOL, TRIG, HDL, CHOLHDL, LDLCALC, LDLDIRECT  Physical Exam:    VS:  BP 132/80   Pulse 60   Ht 5\' 9"  (1.753 m)   Wt 156 lb 6.4 oz (70.9 kg)   SpO2 96%   BMI 23.10 kg/m     Wt Readings from Last 3 Encounters:  08/18/19 156 lb 6.4 oz (70.9 kg)  08/09/19 156 lb (70.8 kg)  08/04/19 166 lb 3.2 oz (75.4 kg)     Constitutional:      Appearance: Healthy appearance. Not in distress.  Neck:     Thyroid: No thyromegaly.     Vascular: JVD normal.  Pulmonary:     Effort: Pulmonary effort is normal.     Breath sounds: No wheezing. No rales.  Cardiovascular:     Normal rate. Regular rhythm. Normal S1. Normal S2.     Murmurs: There is no murmur.  Edema:    Peripheral edema absent.  Abdominal:     Palpations: Abdomen is soft. There is no hepatomegaly.  Skin:    General: Skin is warm and dry.  Neurological:     General: No focal deficit present.     Mental Status: Alert and oriented to person, place and time.     Cranial Nerves: Cranial nerves are intact.       ASSESSMENT & PLAN:    1. Nonrheumatic aortic valve stenosis 2. S/P  minimally-invasive aortic valve replacement with bioprosthetic valve He is progressing well after recent minimally invasive aortic valve replacement.  He would like to start cardiac rehabilitation.  He should be fine to start that now and I have encouraged him to do so.  Continue SBE prophylaxis.  Follow-up echo is already scheduled for late June.  He will follow up with Dr. Burt Knack in 3 months.  3. Mild, non-obstructive coronary artery  disease No significant CAD on cardiac catheterization prior to his valve surgery.  Continue aspirin, statin.  4. Persistent atrial fibrillation (HCC) Maintaining sinus rhythm on amiodarone.  Next week, he can reduce his dose to amiodarone 200 mg daily.  Continue warfarin.  5. Complete heart block (HCC) 6. Cardiac pacemaker in situ Pacemaker site is well-healed.  Continue follow-up with EP as planned.  7. Other closed nondisplaced fracture of sixth cervical vertebra with routine healing, subsequent encounter I reviewed his hospital notes.  He was seen by neurosurgery (Dr. Saintclair Halsted).  He was supposed to have an outpatient follow-up to reimage his neck.  I will arrange follow-up with his office.    Dispo:  Return in about 3 months (around 11/18/2019) for Routine Follow Up, w/ Dr. Burt Knack, in person.   Medication Adjustments/Labs and Tests Ordered: Current medicines are reviewed at length with the patient today.  Concerns regarding medicines are outlined above.  Tests Ordered: Orders Placed This Encounter  Procedures  . EKG 12-Lead   Medication Changes: Meds ordered this encounter  Medications  . amiodarone (PACERONE) 200 MG tablet    Sig: Take 1 tablet (200 mg total) by mouth daily.    Dispense:  90 tablet    Refill:  1    Signed, [name], PA-C  08/18/2019 5:10 PM    [facility] [address] [place], [place], [place]  [address] Phone: [phone]; Fax: [phone]

## 2019-08-18 NOTE — Patient Instructions (Signed)
Medication Instructions:   Your physician has recommended you make the following change in your medication:   1) On 08/23/19 decrease your Amiodarone 200 mg, to 1 tablet by mouth once a day  *If you need a refill on your cardiac medications before your next appointment, please call your pharmacy*  Lab Work:  None ordered today  Testing/Procedures:  None ordered today  Follow-Up: At [facility], you and your health needs are our priority.  As part of our continuing mission to provide you with exceptional heart care, we have created designated Provider Care Teams.  These Care Teams include your primary Cardiologist (physician) and Advanced Practice Providers (APPs -  Physician Assistants and Nurse Practitioners) who all work together to provide you with the care you need, when you need it.  We recommend signing up for the patient portal called "MyChart".  Sign up information is provided on this After Visit Summary.  MyChart is used to connect with patients for Virtual Visits (Telemedicine).  Patients are able to view lab/test results, encounter notes, upcoming appointments, etc.  Non-urgent messages can be sent to your provider as well.   To learn more about what you can do with MyChart, go to [email or web address].    Your next appointment:   3 month(s)  The format for your next appointment:   In Person  Provider:   Sherren Mocha, MD

## 2019-08-20 ENCOUNTER — Telehealth (HOSPITAL_COMMUNITY): Payer: Self-pay

## 2019-08-20 NOTE — Telephone Encounter (Signed)
Called patient to see if he was interested in participating in the Cardiac Rehab Program. Patient stated yes. Patient will come in for orientation on 10/19/19 @ 830AM and will attend the 845AM exercise class.  Mailed letter

## 2019-08-25 ENCOUNTER — Ambulatory Visit: Payer: Medicare Other

## 2019-08-25 ENCOUNTER — Other Ambulatory Visit: Payer: Self-pay

## 2019-08-25 DIAGNOSIS — I4891 Unspecified atrial fibrillation: Secondary | ICD-10-CM | POA: Diagnosis not present

## 2019-08-25 DIAGNOSIS — Z953 Presence of xenogenic heart valve: Secondary | ICD-10-CM

## 2019-08-25 DIAGNOSIS — I4819 Other persistent atrial fibrillation: Secondary | ICD-10-CM | POA: Diagnosis not present

## 2019-08-25 LAB — POCT INR: INR: 3.8 — AB (ref 2.0–3.0)

## 2019-08-25 NOTE — Patient Instructions (Signed)
Description   Skip today's dosage of Warfarin, then start taking 1 tablet daily. Recheck INR in 1 week. Pt on amio 400mg  daily.  Call coumadin clinic for any changes in medications or upcoming procedures, [phone]

## 2019-08-28 ENCOUNTER — Other Ambulatory Visit: Payer: Self-pay | Admitting: Surgical

## 2019-09-01 ENCOUNTER — Ambulatory Visit (INDEPENDENT_AMBULATORY_CARE_PROVIDER_SITE_OTHER): Payer: Medicare Other | Admitting: *Deleted

## 2019-09-01 ENCOUNTER — Telehealth: Payer: Self-pay | Admitting: Cardiovascular Disease

## 2019-09-01 ENCOUNTER — Other Ambulatory Visit: Payer: Self-pay

## 2019-09-01 DIAGNOSIS — Z953 Presence of xenogenic heart valve: Secondary | ICD-10-CM

## 2019-09-01 DIAGNOSIS — I4819 Other persistent atrial fibrillation: Secondary | ICD-10-CM

## 2019-09-01 DIAGNOSIS — I4891 Unspecified atrial fibrillation: Secondary | ICD-10-CM | POA: Diagnosis not present

## 2019-09-01 LAB — POCT INR: INR: 3 (ref 2.0–3.0)

## 2019-09-01 NOTE — Telephone Encounter (Signed)
Patient educated on results of PPM transmission and reassured ppm function WNL and he has had no AF.  Patient reports he has had chest pressure at times with other symptomsReassured patient that would let Dr Burt Knack know of his intermittnt substernal non-radiating chest pressure that occurs while he is running errands and occurs at same time he feels " woozy". No current symptoms. ED precautions given for CP, chest pressure , or syncope.

## 2019-09-01 NOTE — Telephone Encounter (Signed)
Patient called stating he had open heart surgery 5 weeks ago, he has been having some chest discomfort around the area of the insicion for the past few days.  He is feeling fatigue, feels lightheaded, and just not feeling well.  The pain comes and goes.  ?

## 2019-09-01 NOTE — Telephone Encounter (Signed)
Patient reports that he has no issues or concerns with his PPM site. He reports he has not felt well the pat few weeks. Reports "wooziness" and fatigue. He is  concerned that he may be in AF. Assisted patient with sending remote transmission. Device function WNL , presenting EGM is AS/VS at 60 bpm. Transmission shows no episodes recorded since in office interrogation 08/12/19, AT/AF burden 0%. Transmission results reviewed with patient, call dropped as recommending patient follow up with PCP. Attempted to call patient at home with no success. Will make contact with patient after he has coumadin visit.

## 2019-09-01 NOTE — Telephone Encounter (Signed)
Mr. Hughett reports that for about the last week, he has had new intermittent "discomfort" to the left of his incision site at his heart. He doesn't call the sensation painful, but it is something he has noticed and is causing him anxiety.  He has also had general feelings of fatigue, wooziness, and just not feeling well.  BP 138/78 and HR 62 today He denies palpitations, irregular heart rate, racing pulse.  He did not have these symptoms when he saw [facility] or had device wound check late May. He is not at home currently and will be at the office this afternoon at 1400 for Coumadin check. A member from the [facility] will meet him there to give instruction on how to send remote since symptoms started since last check.   Will await interrogation.

## 2019-09-01 NOTE — Patient Instructions (Signed)
Description   Today take 1/2 tablet then start taking 1 tablet daily except 1/2 tablet on Wednesday. Recheck INR in 1 week. Pt on amio 400mg  daily.  Call coumadin clinic for any changes in medications or upcoming procedures, [phone]

## 2019-09-02 NOTE — Telephone Encounter (Signed)
Since PPM transmission was benign, scheduled patient for evaluation with Ermalinda Barrios 6/15.  Instructed the patient to call back tomorrow to check in with a nurse and to confirm appointment.

## 2019-09-03 NOTE — Telephone Encounter (Signed)
I spoke with patient and made him aware of appointment on June 15,2021.  Pt plans to be at this appointment

## 2019-09-06 NOTE — Progress Notes (Signed)
Cardiology Office Note    Date:  09/07/2019   ID:  Jerry Foster, DOB 03/19/1951, MRN [id number]  PCP:  Jerry Petty, MD  Cardiologist: Jerry Mocha, MD EPS: Jerry Grayer, MD  No chief complaint on file.   History of Present Illness:  Jerry Foster is a 69 y.o. male with history of aortic stenosis status post minimally invasive bio prosthetic AVR 08/7339 complicated by postop atrial fibrillation as well as high grade heart block, on amiodarone and underwent pacemaker, mild nonobstructive disease on cath 05/2019,  hyperlipidemia, RBBB.  Patient also had a fall and suffered a laceration to his left eyebrow requiring plastic surgery repair and avulsion fracture of C6 and cervical immobilizer.  Patient saw Jerry Dopp, PA-C 08/18/2019 at which time he was doing well and no changes were made.  Patient called in 09/01/2019 complaining of feeling woozy and fatigued as well as some chest pressure and was worried that he was in atrial fibrillation.  Transmission showed no evidence of atrial fibrillation. Patient says while shopping he gets a little foggy in his head. Not a lot of energy. Hasn't passed out. Hasn't lost weight and trying to stay hydrated. Doesn't start cardiac rehab until July. No bleeding problems.  Past Medical History:  Diagnosis Date  . Aortic stenosis   . Benign prostatic hyperplasia   . Bronchitis   . Cataract    bilateral   . CHF (congestive heart failure) ([place])   . Colon polyps   . Complete heart block ([place]) 07/28/2019  . Heart murmur   . History of fracture of vertebral column   . Hyperlipidemia   . Kidney stone   . Nonrheumatic aortic valve stenosis   . Osteoporosis   . Osteoporosis   . Pure hypercholesterolemia   . RBBB   . 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 Sensorineural hearing loss of both ears   .  Thrombosed hemorrhoids    hsitory of  . Tubular adenoma of colon   . Vitamin D deficiency     Past Surgical History:  Procedure Laterality Date  . AORTIC VALVE REPLACEMENT N/A 07/23/2019   Procedure: MINIMALLY INVASIVE AORTIC VALVE REPLACEMENT (AVR) using Jerry Foster Resilia 21 MM Aortic Valve.;  Surgeon: Jerry Alberts, MD;  Location: [facility];  Service: Open Heart Surgery;  Laterality: N/A;  . COLON SURGERY  2006   1 foot ascending colon removed   . COLONOSCOPY    . HERNIA REPAIR    . PACEMAKER IMPLANT N/A 07/29/2019   Procedure: PACEMAKER IMPLANT;  Surgeon: Jerry Grayer, MD;  Location: [facility];  Service: Cardiovascular;  Laterality: N/A;  . POLYPECTOMY    . RIGHT HEART CATH AND CORONARY ANGIOGRAPHY N/A 06/08/2019   Procedure: RIGHT HEART CATH AND CORONARY ANGIOGRAPHY;  Surgeon: Jerry Mocha, MD;  Location: [facility];  Service: Cardiovascular;  Laterality: N/A;  . TEE WITHOUT CARDIOVERSION N/A 07/23/2019   Procedure: TRANSESOPHAGEAL ECHOCARDIOGRAM (TEE);  Surgeon: Jerry Alberts, MD;  Location: [facility];  Service: Open Heart Surgery;  Laterality: N/A;  . TESTICLE SURGERY      Current Medications: Current Meds  Medication Sig  . acetaminophen (TYLENOL) 500 MG tablet Take 500 mg by mouth every 8 (eight) hours as needed for moderate pain or headache.  [name] amiodarone (PACERONE) 200 MG tablet Take 1 tablet (200 mg total) by mouth daily.  [name]  aspirin 81 MG EC tablet Take 1 tablet (81 mg total) by mouth daily.  . Calcium Citrate-Vitamin D (CITRACAL + D PO) Take 2 tablets by mouth in the morning and at bedtime.  . Cholecalciferol (DIALYVITE VITAMIN D 5000) 125 MCG (5000 UT) capsule Take 5,000 Units by mouth daily.  [name] ibuprofen (ADVIL) 200 MG tablet Take 400 mg by mouth every 6 (six) hours as needed for moderate pain.  . metoprolol tartrate (LOPRESSOR) 50 MG tablet Take 1 tablet (50 mg total) by mouth 2 (two) times daily.  . Omega-3 1400 MG CAPS Take 1,400 mg by mouth  daily.  . pravastatin (PRAVACHOL) 40 MG tablet Take 40 mg by mouth.  . warfarin (COUMADIN) 2.5 MG tablet Take 1 tablet (2.5 mg total) by mouth daily at 4 PM.     Allergies:   Terbinafine hcl   Social History   Socioeconomic History  . Marital status: Single    Spouse name: Not on file  . Number of children: Not on file  . Years of education: Not on file  . Highest education level: Not on file  Occupational History  . Not on file  Tobacco Use  . Smoking status: Never Smoker  . Smokeless tobacco: Never Used  Substance and Sexual Activity  . Alcohol use: Yes    Comment: Moderately, 2 bottles wine per week  . Drug use: No  . Sexual activity: Not on file  Other Topics Concern  . Not on file  Social History Narrative  . Not on file   Social Determinants of Health   Financial Resource Strain:   . Difficulty of Paying Living Expenses:   Food Insecurity:   . Worried About Charity fundraiser in the Last Year:   . Arboriculturist in the Last Year:   Transportation Needs:   . Film/video editor (Medical):   [name] Lack of Transportation (Non-Medical):   Physical Activity:   . Days of Exercise per Week:   . Minutes of Exercise per Session:   Stress:   . Feeling of Stress :   Social Connections:   . Frequency of Communication with Friends and Family:   . Frequency of Social Gatherings with Friends and Family:   . Attends Religious Services:   . Active Member of Clubs or Organizations:   . Attends Archivist Meetings:   [name] Marital Status:      Family History:  The patient's   family history is not on file.   ROS:   Please see the history of present illness.    ROS All other systems reviewed and are negative.   PHYSICAL EXAM:   VS:  Ht 5\' 9"  (1.753 m)   Wt 156 lb (70.8 kg)   SpO2 97%   BMI 23.04 kg/m   Physical Exam  GEN: Well nourished, well developed, in no acute distress  Neck: no JVD, carotid bruits, or masses Cardiac:RRR; no murmurs, rubs, or  gallops  Respiratory:  clear to auscultation bilaterally, normal work of breathing GI: soft, nontender, nondistended, + BS Ext: without cyanosis, clubbing, or edema, Good distal pulses bilaterally Neuro:  Alert and Oriented x 3 Psych: euthymic mood, full affect  Wt Readings from Last 3 Encounters:  09/07/19 156 lb (70.8 kg)  08/18/19 156 lb 6.4 oz (70.9 kg)  08/09/19 156 lb (70.8 kg)      Studies/Labs Reviewed:   EKG:  EKG is  ordered today.  The ekg ordered today demonstrates Paced  rhythm   Recent Labs: 07/19/2019: ALT 26 07/30/2019: Hemoglobin 12.1; Platelets 145 07/31/2019: BUN 18; Creatinine, Ser 0.69; Magnesium 1.9; Potassium 3.9; Sodium 133   Lipid Panel No results found for: CHOL, TRIG, HDL, CHOLHDL, VLDL, LDLCALC, LDLDIRECT  Additional studies/ records that were reviewed today include:  Cardiac catheterization 06/08/2019 LAD mid 25 LCx minimal irregularities RCA ostial 30       ASSESSMENT:    1. S/P minimally-invasive aortic valve replacement with bioprosthetic valve   2. Coronary artery disease involving native heart without angina pectoris, unspecified vessel or lesion type   3. Paroxysmal atrial fibrillation (HCC)   4. Complete heart block ([place])   5. Dizziness      PLAN:  In order of problems listed above:  Dizziness-with walking-not orthostatic. No Afib. Will discuss how long Dr. Rayann Heman wants to continue Amio. Will check labs today as well.  Status post minimally invasive aortic valve replacement with bioprosthetic valve.  Follow-up echo scheduled for late June  Mild nonobstructive CAD on cardiac cath prior to valve surgery  Paroxysmal atrial fibrillation maintaining normal sinus rhythm on amiodarone and Coumadin  Complete heart block status post pacemaker  Medication Adjustments/Labs and Tests Ordered: Current medicines are reviewed at length with the patient today.  Concerns regarding medicines are outlined above.  Medication changes, Labs and Tests  ordered today are listed in the Patient Instructions below. Patient Instructions  Medication Instructions:  Your physician recommends that you continue on your current medications as directed. Please refer to the Current Medication list given to you today.  *If you need a refill on your cardiac medications before your next appointment, please call your pharmacy*   Lab Work: TODAY: CBC, CMET, TSH  If you have labs (blood work) drawn today and your tests are completely normal, you will receive your results only by: [name] MyChart Message (if you have MyChart) OR . A paper copy in the mail If you have any lab test that is abnormal or we need to change your treatment, we will call you to review the results.   Testing/Procedures: NONE   Follow-Up: At [facility], you and your health needs are our priority.  As part of our continuing mission to provide you with exceptional heart care, we have created designated Provider Care Teams.  These Care Teams include your primary Cardiologist (physician) and Advanced Practice Providers (APPs -  Physician Assistants and Nurse Practitioners) who all work together to provide you with the care you need, when you need it.  We recommend signing up for the patient portal called "MyChart".  Sign up information is provided on this After Visit Summary.  MyChart is used to connect with patients for Virtual Visits (Telemedicine).  Patients are able to view lab/test results, encounter notes, upcoming appointments, etc.  Non-urgent messages can be sent to your provider as well.   To learn more about what you can do with MyChart, go to [email or web address].    Your next appointment:   KEEP APPOINTMENT WITH DR. Sanford IN [place]  The format for your next appointment:   In Person  Provider:   Sherren Mocha, MD and Jerry Grayer, MD        Signed, Ermalinda Barrios, PA-C  09/07/2019 9:06 AM    [facility] [place], [place], [place]  [address] Phone: [phone]; Fax: [phone]

## 2019-09-07 ENCOUNTER — Encounter: Payer: Self-pay | Admitting: Physician Assistant

## 2019-09-07 ENCOUNTER — Ambulatory Visit: Payer: Medicare Other | Admitting: Physician Assistant

## 2019-09-07 ENCOUNTER — Other Ambulatory Visit: Payer: Self-pay

## 2019-09-07 VITALS — Ht 69.0 in | Wt 156.0 lb

## 2019-09-07 DIAGNOSIS — Z953 Presence of xenogenic heart valve: Secondary | ICD-10-CM | POA: Diagnosis not present

## 2019-09-07 DIAGNOSIS — I2510 Atherosclerotic heart disease of native coronary artery without angina pectoris: Secondary | ICD-10-CM | POA: Diagnosis not present

## 2019-09-07 DIAGNOSIS — I442 Atrioventricular block, complete: Secondary | ICD-10-CM | POA: Diagnosis not present

## 2019-09-07 DIAGNOSIS — I480 Paroxysmal atrial fibrillation: Secondary | ICD-10-CM

## 2019-09-07 DIAGNOSIS — R42 Dizziness and giddiness: Secondary | ICD-10-CM

## 2019-09-07 LAB — TSH: TSH: 1.25 u[IU]/mL (ref 0.450–4.500)

## 2019-09-07 LAB — COMPREHENSIVE METABOLIC PANEL
ALT: 27 IU/L (ref 0–44)
AST: 33 IU/L (ref 0–40)
Albumin/Globulin Ratio: 1.5 (ref 1.2–2.2)
Albumin: 4 g/dL (ref 3.8–4.8)
Alkaline Phosphatase: 73 IU/L (ref 48–121)
BUN/Creatinine Ratio: 22 (ref 10–24)
BUN: 18 mg/dL (ref 8–27)
Bilirubin Total: 0.4 mg/dL (ref 0.0–1.2)
CO2: 23 mmol/L (ref 20–29)
Calcium: 9.5 mg/dL (ref 8.6–10.2)
Chloride: 101 mmol/L (ref 96–106)
Creatinine, Ser: 0.81 mg/dL (ref 0.76–1.27)
GFR calc Af Amer: 104 mL/min/{1.73_m2} (ref >59)
GFR calc non Af Amer: 90 mL/min/{1.73_m2} (ref >59)
Globulin, Total: 2.7 g/dL (ref 1.5–4.5)
Glucose: 88 mg/dL (ref 65–99)
Potassium: 4.7 mmol/L (ref 3.5–5.2)
Sodium: 137 mmol/L (ref 134–144)
Total Protein: 6.7 g/dL (ref 6.0–8.5)

## 2019-09-07 LAB — CBC
Hematocrit: 45.9 % (ref 37.5–51.0)
Hemoglobin: 14.8 g/dL (ref 13.0–17.7)
MCH: 29.2 pg (ref 26.6–33.0)
MCHC: 32.2 g/dL (ref 31.5–35.7)
MCV: 91 fL (ref 79–97)
Platelets: 163 10*3/uL (ref 150–450)
RBC: 5.06 x10E6/uL (ref 4.14–5.80)
RDW: 13.2 % (ref 11.6–15.4)
WBC: 7.5 10*3/uL (ref 3.4–10.8)

## 2019-09-07 NOTE — Patient Instructions (Signed)
Medication Instructions:  Your physician recommends that you continue on your current medications as directed. Please refer to the Current Medication list given to you today.  *If you need a refill on your cardiac medications before your next appointment, please call your pharmacy*   Lab Work: TODAY: CBC, CMET, TSH  If you have labs (blood work) drawn today and your tests are completely normal, you will receive your results only by: [name] MyChart Message (if you have MyChart) OR . A paper copy in the mail If you have any lab test that is abnormal or we need to change your treatment, we will call you to review the results.   Testing/Procedures: NONE   Follow-Up: At [facility], you and your health needs are our priority.  As part of our continuing mission to provide you with exceptional heart care, we have created designated Provider Care Teams.  These Care Teams include your primary Cardiologist (physician) and Advanced Practice Providers (APPs -  Physician Assistants and Nurse Practitioners) who all work together to provide you with the care you need, when you need it.  We recommend signing up for the patient portal called "MyChart".  Sign up information is provided on this After Visit Summary.  MyChart is used to connect with patients for Virtual Visits (Telemedicine).  Patients are able to view lab/test results, encounter notes, upcoming appointments, etc.  Non-urgent messages can be sent to your provider as well.   To learn more about what you can do with MyChart, go to [email or web address].    Your next appointment:   KEEP APPOINTMENT WITH DR. [place] IN [place]  The format for your next appointment:   In Person  Provider:   Sherren Mocha, MD and [name], MD

## 2019-09-08 ENCOUNTER — Ambulatory Visit: Payer: Medicare Other | Admitting: *Deleted

## 2019-09-08 DIAGNOSIS — Z953 Presence of xenogenic heart valve: Secondary | ICD-10-CM

## 2019-09-08 DIAGNOSIS — I4891 Unspecified atrial fibrillation: Secondary | ICD-10-CM

## 2019-09-08 DIAGNOSIS — I4819 Other persistent atrial fibrillation: Secondary | ICD-10-CM

## 2019-09-08 LAB — POCT INR: INR: 2.3 (ref 2.0–3.0)

## 2019-09-08 NOTE — Patient Instructions (Signed)
Description   Since you took 1/2 tablet on Monday take 1 tablet today then continue taking 1 tablet daily except 1/2 tablet on Wednesday. Recheck INR in 1 week. Pt on amio 400mg  daily.  Call coumadin clinic for any changes in medications or upcoming procedures, [phone]

## 2019-09-10 ENCOUNTER — Other Ambulatory Visit (HOSPITAL_COMMUNITY): Payer: Self-pay | Admitting: Cardiovascular Disease

## 2019-09-10 DIAGNOSIS — I350 Nonrheumatic aortic (valve) stenosis: Secondary | ICD-10-CM

## 2019-09-14 ENCOUNTER — Ambulatory Visit (INDEPENDENT_AMBULATORY_CARE_PROVIDER_SITE_OTHER): Payer: Medicare Other

## 2019-09-14 ENCOUNTER — Other Ambulatory Visit: Payer: Self-pay

## 2019-09-14 ENCOUNTER — Ambulatory Visit (HOSPITAL_COMMUNITY): Payer: Medicare Other | Attending: Cardiology

## 2019-09-14 DIAGNOSIS — Z5181 Encounter for therapeutic drug level monitoring: Secondary | ICD-10-CM

## 2019-09-14 DIAGNOSIS — Z953 Presence of xenogenic heart valve: Secondary | ICD-10-CM | POA: Diagnosis not present

## 2019-09-14 DIAGNOSIS — I4891 Unspecified atrial fibrillation: Secondary | ICD-10-CM

## 2019-09-14 DIAGNOSIS — I4819 Other persistent atrial fibrillation: Secondary | ICD-10-CM | POA: Diagnosis not present

## 2019-09-14 DIAGNOSIS — I350 Nonrheumatic aortic (valve) stenosis: Secondary | ICD-10-CM | POA: Insufficient documentation

## 2019-09-14 LAB — POCT INR: INR: 2.9 (ref 2.0–3.0)

## 2019-09-14 NOTE — Patient Instructions (Signed)
Continue taking 1 tablet daily except 1/2 tablet on Wednesday. Recheck INR in 2 weeks. Pt on amio 200mg  daily. Call coumadin clinic for any changes in medications or upcoming procedures, [phone]

## 2019-09-27 ENCOUNTER — Other Ambulatory Visit: Payer: Self-pay | Admitting: Surgical

## 2019-09-28 ENCOUNTER — Other Ambulatory Visit: Payer: Self-pay

## 2019-09-28 ENCOUNTER — Ambulatory Visit (INDEPENDENT_AMBULATORY_CARE_PROVIDER_SITE_OTHER): Payer: Medicare Other

## 2019-09-28 DIAGNOSIS — I4819 Other persistent atrial fibrillation: Secondary | ICD-10-CM | POA: Diagnosis not present

## 2019-09-28 DIAGNOSIS — Z953 Presence of xenogenic heart valve: Secondary | ICD-10-CM | POA: Diagnosis not present

## 2019-09-28 DIAGNOSIS — Z5181 Encounter for therapeutic drug level monitoring: Secondary | ICD-10-CM | POA: Diagnosis not present

## 2019-09-28 DIAGNOSIS — I4891 Unspecified atrial fibrillation: Secondary | ICD-10-CM

## 2019-09-28 LAB — POCT INR: INR: 2.4 (ref 2.0–3.0)

## 2019-09-28 NOTE — Patient Instructions (Signed)
Continue taking 1 tablet daily except 1/2 tablet on Wednesday. Recheck INR in 3 weeks. Call coumadin clinic for any changes in medications or upcoming procedures, [phone]

## 2019-10-01 ENCOUNTER — Other Ambulatory Visit: Payer: Self-pay | Admitting: Surgical

## 2019-10-01 ENCOUNTER — Other Ambulatory Visit: Payer: Self-pay | Admitting: Family Medicine

## 2019-10-01 ENCOUNTER — Other Ambulatory Visit: Payer: Self-pay

## 2019-10-01 MED ORDER — METOPROLOL TARTRATE 50 MG PO TABS: 50.0000 mg | ORAL_TABLET | Freq: Two times a day (BID) | ORAL | 3 refills | Status: DC

## 2019-10-01 NOTE — Progress Notes (Signed)
He called me to see about getting metoprolol which she was about to run out of.  I checked his record and it was apparently called in already to the [facility] on [address].

## 2019-10-01 NOTE — Telephone Encounter (Signed)
Pt's medication was sent to pt's pharmacy as requested. Confirmation received.  °

## 2019-10-11 ENCOUNTER — Telehealth (HOSPITAL_COMMUNITY): Payer: Self-pay | Admitting: Pharmacist

## 2019-10-11 NOTE — Telephone Encounter (Signed)
Cardiac Rehab Medication Review by a Pharmacist  Does the patient  feel that his/her medications are working for him/her?  yes  Has the patient been experiencing any side effects to the medications prescribed?  no  Does the patient measure his/her own blood pressure or blood glucose at home?  Did not discuss   Does the patient have any problems obtaining medications due to transportation or finances?   no  Understanding of regimen: excellent Understanding of indications: good Potential of compliance: good  Pharmacist Intervention: No interventions at this time. Patient disclosed he is on vacation and exercising frequently, including a 2 hour walk and at least a 200 ft hike.   Norina Buzzard, PharmD PGY1 Pharmacy Resident Phone: [phone] 10/11/2019 6:53 PM

## 2019-10-15 ENCOUNTER — Telehealth (HOSPITAL_COMMUNITY): Payer: Self-pay

## 2019-10-15 NOTE — Telephone Encounter (Signed)
Cardiac Rehab Telephone Note:  Successful telephone encounter to Sajid Raftery to confirm Cardiac Rehab orientation appointment for 10/19/19 at 8:30. Patient is currently out of state and wishes to be contacted Monday if possible for pre-orientation health assessment. If unable to reach patient, health assessment will be completed at orientation.  Jeanny Rymer E. Rollene Rotunda RN, BSN [place]. [facility]  Cardiac and Pulmonary Rehabilitation Phone: [phone] Fax: [phone]

## 2019-10-18 ENCOUNTER — Telehealth (HOSPITAL_COMMUNITY): Payer: Self-pay | Admitting: *Deleted

## 2019-10-18 NOTE — Telephone Encounter (Signed)
Left message to call cardiac rehab regarding completing health history for orientation.Barnet Pall, RN,BSN 10/18/2019 4:33 PM

## 2019-10-19 ENCOUNTER — Ambulatory Visit (INDEPENDENT_AMBULATORY_CARE_PROVIDER_SITE_OTHER): Payer: Medicare Other | Admitting: *Deleted

## 2019-10-19 ENCOUNTER — Other Ambulatory Visit: Payer: Self-pay

## 2019-10-19 ENCOUNTER — Encounter (HOSPITAL_COMMUNITY)
Admission: RE | Admit: 2019-10-19 | Discharge: 2019-10-19 | Disposition: A | Discharge status: Home / Self Care | Payer: Medicare Other | Source: Ambulatory Visit | Attending: Cardiovascular Disease | Admitting: Cardiovascular Disease

## 2019-10-19 ENCOUNTER — Encounter (HOSPITAL_COMMUNITY): Payer: Self-pay

## 2019-10-19 VITALS — BP 124/70 | HR 60 | Ht 69.0 in | Wt 161.2 lb

## 2019-10-19 DIAGNOSIS — I4819 Other persistent atrial fibrillation: Secondary | ICD-10-CM | POA: Diagnosis not present

## 2019-10-19 DIAGNOSIS — Z952 Presence of prosthetic heart valve: Secondary | ICD-10-CM | POA: Insufficient documentation

## 2019-10-19 DIAGNOSIS — I4891 Unspecified atrial fibrillation: Secondary | ICD-10-CM

## 2019-10-19 DIAGNOSIS — Z953 Presence of xenogenic heart valve: Secondary | ICD-10-CM | POA: Diagnosis not present

## 2019-10-19 LAB — POCT INR: INR: 1.5 — AB (ref 2.0–3.0)

## 2019-10-19 NOTE — Progress Notes (Signed)
Cardiac Individual Treatment Plan  Patient Details  Name: Jerry Foster MRN: [id number] Date of Birth: [birth year] Referring Provider:     [place] from 10/19/2019 in [place]  Referring Provider Sherren Mocha, MD      Initial Encounter Date:    CARDIAC REHAB PHASE II ORIENTATION from 10/19/2019 in [place]  Date 10/19/19      Visit Diagnosis: 07/23/19 AVR  Patient's Home Medications on Admission:  Current Outpatient Medications:    acetaminophen (TYLENOL) 500 MG tablet, Take 500 mg by mouth every 8 (eight) hours as needed for moderate pain or headache. (Patient not taking: Reported on 10/11/2019), Disp: , Rfl:    aspirin 81 MG EC tablet, Take 1 tablet (81 mg total) by mouth daily., Disp: 30 tablet, Rfl:    Calcium Citrate-Vitamin D (CITRACAL + D PO), Take 2 tablets by mouth in the morning and at bedtime., Disp: , Rfl:    Cholecalciferol (DIALYVITE VITAMIN D 5000) 125 MCG (5000 UT) capsule, Take 5,000 Units by mouth daily., Disp: , Rfl:    ibuprofen (ADVIL) 200 MG tablet, Take 400 mg by mouth every 6 (six) hours as needed for moderate pain. (Patient not taking: Reported on 10/11/2019), Disp: , Rfl:    metoprolol tartrate (LOPRESSOR) 50 MG tablet, Take 1 tablet (50 mg total) by mouth 2 (two) times daily., Disp: 180 tablet, Rfl: 3   Omega-3 1400 MG CAPS, Take 1,400 mg by mouth daily. (Patient not taking: Reported on 10/11/2019), Disp: , Rfl:    pravastatin (PRAVACHOL) 40 MG tablet, Take 40 mg by mouth., Disp: , Rfl:    warfarin (COUMADIN) 2.5 MG tablet, Take 1 tablet (2.5 mg total) by mouth daily at 4 PM., Disp: 100 tablet, Rfl: 1  Past Medical History: Past Medical History:  Diagnosis Date   Aortic stenosis    Benign prostatic hyperplasia    Bronchitis    Cataract    bilateral    CHF (congestive heart failure) (HCC)    Colon polyps    Complete heart block ([place]) 07/28/2019    Heart murmur    History of fracture of vertebral column    Hyperlipidemia    Kidney stone    Nonrheumatic aortic valve stenosis    Osteoporosis    Osteoporosis    Pure hypercholesterolemia    RBBB    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   21 mm Edwards Inspiris Resilia stented bovine pericardial tissue valve via right mini thoracotomy approach   Sensorineural hearing loss of both ears    Thrombosed hemorrhoids    hsitory of   Tubular adenoma of colon    Vitamin D deficiency     Tobacco Use: Social History   Tobacco Use  Smoking Status Never Smoker  Smokeless Tobacco Never Used    Labs: Recent Review Flowsheet Data    Labs for ITP Cardiac and Pulmonary Rehab Latest Ref Rng & Units 07/23/2019 07/23/2019 07/23/2019 07/23/2019 07/24/2019   Hemoglobin A1c 4.8 - 5.6 % - - - - -   PHART 7.35 - 7.45 7.128(LL) 7.353 7.405 7.261(L) 7.364   PCO2ART 32 - 48 mmHg 76.1(HH) 36.6 36.5 50.9(H) 38.6   HCO3 20.0 - 28.0 mmol/L 25.2 20.4 22.9 22.8 22.0   TCO2 22 - 32 mmol/L 28 22 24 24 23    ACIDBASEDEF 0.0 - 2.0 mmol/L 6.0(H) 5.0(H) 2.0 5.0(H) 3.0(H)   O2SAT % 94.0 99.0 99.0 98.0 98.0  Capillary Blood Glucose: Lab Results  Component Value Date   GLUCAP 123 (H) 07/28/2019   GLUCAP 122 (H) 07/28/2019   GLUCAP 121 (H) 07/27/2019   GLUCAP 128 (H) 07/27/2019   GLUCAP 94 07/27/2019     Exercise Target Goals: Exercise Program Goal: Individual exercise prescription set using results from initial 6 min walk test and THRR while considering  patients activity barriers and safety.   Exercise Prescription Goal: Starting with aerobic activity 30 plus minutes a day, 3 days per week for initial exercise prescription. Provide home exercise prescription and guidelines that participant acknowledges understanding prior to discharge.  Activity Barriers & Risk Stratification:  Activity Barriers & Cardiac Risk Stratification - 10/19/19 0904        Activity Barriers & Cardiac Risk Stratification   Activity Barriers None    Cardiac Risk Stratification High           6 Minute Walk:  6 Minute Walk    Row Name 10/19/19 0913         6 Minute Walk   Phase Initial     Distance 2335 feet     Walk Time 6 minutes     # of Rest Breaks 0     MPH 4.42     METS 4.98     RPE 13     Perceived Dyspnea  0     VO2 Peak 17.42     Symptoms No     Resting HR 60 bpm     Resting BP 124/70     Resting Oxygen Saturation  97 %     Exercise Oxygen Saturation  during 6 min walk 98 %     Max Ex. HR 84 bpm     Max Ex. BP 158/82     2 Minute Post BP 122/80            Oxygen Initial Assessment:   Oxygen Re-Evaluation:   Oxygen Discharge (Final Oxygen Re-Evaluation):   Initial Exercise Prescription:  Initial Exercise Prescription - 10/19/19 1100      Date of Initial Exercise RX and Referring Provider   Date 10/19/19    Referring Provider Sherren Mocha, MD    Expected Discharge Date 12/17/19      Treadmill   MPH 3.2    Grade 1    Minutes 15    METs 3.89      NuStep   Level 4    SPM 85    Minutes 15    METs 3      Prescription Details   Frequency (times per week) 3    Duration Progress to 30 minutes of continuous aerobic without signs/symptoms of physical distress      Intensity   THRR 40-80% of Max Heartrate 60-120    Ratings of Perceived Exertion 11-13    Perceived Dyspnea 0-4      Progression   Progression Continue to progress workloads to maintain intensity without signs/symptoms of physical distress.      Resistance Training   Training Prescription Yes    Weight 4lbs    Reps 10-15           Perform Capillary Blood Glucose checks as needed.  Exercise Prescription Changes:   Exercise Comments:   Exercise Goals and Review:   Exercise Goals    Row Name 10/19/19 0904             Exercise Goals   Increase Physical Activity Yes  Intervention Provide advice, education, support and  counseling about physical activity/exercise needs.;Develop an individualized exercise prescription for aerobic and resistive training based on initial evaluation findings, risk stratification, comorbidities and participant's personal goals.       Expected Outcomes Short Term: Attend rehab on a regular basis to increase amount of physical activity.;Long Term: Exercising regularly at least 3-5 days a week.;Long Term: Add in home exercise to make exercise part of routine and to increase amount of physical activity.       Increase Strength and Stamina Yes       Intervention Provide advice, education, support and counseling about physical activity/exercise needs.;Develop an individualized exercise prescription for aerobic and resistive training based on initial evaluation findings, risk stratification, comorbidities and participant's personal goals.       Expected Outcomes Short Term: Increase workloads from initial exercise prescription for resistance, speed, and METs.;Short Term: Perform resistance training exercises routinely during rehab and add in resistance training at home;Long Term: Improve cardiorespiratory fitness, muscular endurance and strength as measured by increased METs and functional capacity (6MWT)       Able to understand and use rate of perceived exertion (RPE) scale Yes       Intervention Provide education and explanation on how to use RPE scale       Expected Outcomes Short Term: Able to use RPE daily in rehab to express subjective intensity level;Long Term:  Able to use RPE to guide intensity level when exercising independently       Knowledge and understanding of Target Heart Rate Range (THRR) Yes       Intervention Provide education and explanation of THRR including how the numbers were predicted and where they are located for reference       Expected Outcomes Short Term: Able to state/look up THRR;Long Term: Able to use THRR to govern intensity when exercising independently;Short Term:  Able to use daily as guideline for intensity in rehab       Able to check pulse independently Yes       Intervention Provide education and demonstration on how to check pulse in carotid and radial arteries.;Review the importance of being able to check your own pulse for safety during independent exercise       Expected Outcomes Short Term: Able to explain why pulse checking is important during independent exercise;Long Term: Able to check pulse independently and accurately       Understanding of Exercise Prescription Yes       Intervention Provide education, explanation, and written materials on patient's individual exercise prescription       Expected Outcomes Short Term: Able to explain program exercise prescription;Long Term: Able to explain home exercise prescription to exercise independently              Exercise Goals Re-Evaluation :    Discharge Exercise Prescription (Final Exercise Prescription Changes):   Nutrition:  Target Goals: Understanding of nutrition guidelines, daily intake of sodium 1500mg , cholesterol 200mg , calories 30% from fat and 7% or less from saturated fats, daily to have 5 or more servings of fruits and vegetables.  Biometrics:  Pre Biometrics - 10/19/19 0909      Pre Biometrics   Height 5\' 9"  (1.753 m)    Waist Circumference 32.5 inches    Hip Circumference 38.25 inches    Waist to Hip Ratio 0.85 %    BMI (Calculated) 23.79    Triceps Skinfold 9 mm    % Body Fat 20.5 %  Grip Strength 40.5 kg    Flexibility 13.13 in    Single Leg Stand 30 seconds            Nutrition Therapy Plan and Nutrition Goals:   Nutrition Assessments:   Nutrition Goals Re-Evaluation:   Nutrition Goals Discharge (Final Nutrition Goals Re-Evaluation):   Psychosocial: Target Goals: Acknowledge presence or absence of significant depression and/or stress, maximize coping skills, provide positive support system. Participant is able to verbalize types and ability  to use techniques and skills needed for reducing stress and depression.  Initial Review & Psychosocial Screening:  Initial Psych Review & Screening - 10/19/19 1209      Initial Review   Current issues with None Identified      Family Dynamics   Good Support System? Yes   [name] lives alone but has friends nearby for support. Jim's twin brother lives in [place]     Barriers   Psychosocial barriers to participate in program There are no identifiable barriers or psychosocial needs.      Screening Interventions   Interventions Encouraged to exercise           Quality of Life Scores:  Quality of Life - 10/19/19 1116      Quality of Life   Select Quality of Life      Quality of Life Scores   Health/Function Pre 26.89 %    Socioeconomic Pre 29.17 %    Psych/Spiritual Pre 21.7 %    Family Pre 30 %    GLOBAL Pre 26.67 %          Scores of 19 and below usually indicate a poorer quality of life in these areas.  A difference of  2-3 points is a clinically meaningful difference.  A difference of 2-3 points in the total score of the Quality of Life Index has been associated with significant improvement in overall quality of life, self-image, physical symptoms, and general health in studies assessing change in quality of life.  PHQ-9: Recent Review Flowsheet Data    Depression screen [facility] 2/9 10/19/2019   Decreased Interest 0   Down, Depressed, Hopeless 0   PHQ - 2 Score 0     Interpretation of Total Score  Total Score Depression Severity:  1-4 = Minimal depression, 5-9 = Mild depression, 10-14 = Moderate depression, 15-19 = Moderately severe depression, 20-27 = Severe depression   Psychosocial Evaluation and Intervention:   Psychosocial Re-Evaluation:   Psychosocial Discharge (Final Psychosocial Re-Evaluation):   Vocational Rehabilitation: Provide vocational rehab assistance to qualifying candidates.   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 Participant will state understanding/return demonstration of topics presented.  Learning Barriers/Preferences:  Learning Barriers/Preferences - 10/19/19 1211      Learning Barriers/Preferences   Learning Barriers None    Learning Preferences Audio;Individual Instruction;Skilled Demonstration;Verbal Instruction           Education Topics: Hypertension, Hypertension Reduction -Define heart disease and high blood pressure. Discus how high blood pressure affects the body and ways to reduce high blood pressure.   Exercise and Your Heart -Discuss why it is important to exercise, the  FITT principles of exercise, normal and abnormal responses to exercise, and how to exercise safely.   Angina -Discuss definition of angina, causes of angina, treatment of angina, and how to decrease risk of having angina.   Cardiac Medications -Review what the following cardiac medications are used for, how they affect the body, and side effects that may occur when taking the medications.  Medications include Aspirin, Beta blockers, calcium channel blockers, ACE Inhibitors, angiotensin receptor blockers, diuretics, digoxin, and antihyperlipidemics.   Congestive Heart Failure -Discuss the definition of CHF, how to live with CHF, the signs and symptoms of CHF, and how keep track of weight and sodium intake.   Heart Disease and Intimacy -Discus the effect sexual activity has on the heart, how changes occur during intimacy as we age, and safety during sexual activity.   Smoking Cessation / COPD -Discuss different methods to quit smoking, the health benefits of quitting smoking, and the  definition of COPD.   Nutrition I: Fats -Discuss the types of cholesterol, what cholesterol does to the heart, and how cholesterol levels can be controlled.   Nutrition II: Labels -Discuss the different components of food labels and how to read food label   Heart Parts/Heart Disease and PAD -Discuss the anatomy of the heart, the pathway of blood circulation through the heart, and these are affected by heart disease.   Stress I: Signs and Symptoms -Discuss the causes of stress, how stress may lead to anxiety and depression, and ways to limit stress.   Stress II: Relaxation -Discuss different types of relaxation techniques to limit stress.   Warning Signs of Stroke / TIA -Discuss definition of a stroke, what the signs and symptoms are of a stroke, and how to identify when someone is having stroke.   Knowledge Questionnaire Score:  Knowledge Questionnaire Score - 10/19/19 1116      Knowledge Questionnaire Score   Pre Score 21/24           Core Components/Risk Factors/Patient Goals at Admission:  Personal Goals and Risk Factors at Admission - 10/19/19 0904      Core Components/Risk Factors/Patient Goals on Admission    Weight Management Yes;Weight Loss    Intervention Weight Management: Provide education and appropriate resources to help participant work on and attain dietary goals.;Weight Management: Develop a combined nutrition and exercise program designed to reach desired caloric intake, while maintaining appropriate intake of nutrient and fiber, sodium and fats, and appropriate energy expenditure required for the weight goal.    Admit Weight 161 lb 2.5 oz (73.1 kg)    Goal Weight: Short Term 155 lb (70.3 kg)    Goal Weight: Long Term 151 lb (68.5 kg)    Expected Outcomes Short Term: Continue to assess and modify interventions until short term weight is achieved;Long Term: Adherence to nutrition and physical activity/exercise program aimed toward attainment of  established weight goal;Weight Loss: Understanding of general recommendations for a balanced deficit meal plan, which promotes 1-2 lb weight loss per week and includes a negative energy balance of [phone] kcal/d;Understanding recommendations for meals to include 15-35% energy as protein, 25-35% energy from fat, 35-60% energy from carbohydrates, less than 200mg  of dietary cholesterol, 20-35 gm of total fiber daily;Understanding of distribution of calorie intake throughout the day with the consumption of 4-5 meals/snacks    Lipids Yes    Intervention Provide education and support for participant on nutrition & aerobic/resistive exercise along with prescribed medications to achieve LDL 70mg , HDL >40mg .    Expected  Outcomes Short Term: Participant states understanding of desired cholesterol values and is compliant with medications prescribed. Participant is following exercise prescription and nutrition guidelines.;Long Term: Cholesterol controlled with medications as prescribed, with individualized exercise RX and with personalized nutrition plan. Value goals: LDL < 70mg , HDL > 40 mg.    Personal Goal Other Yes    Personal Goal Improve flexibility and balance. Return to jogging.    Intervention Provide advice, education, support and counseling about physical activity/exercise needs.; Develop an individualized exercise prescription for aerobic and resistive training based on initial evaluation findings, risk stratification, comorbidities and participant's personal goals.    Expected Outcomes Perform stretching exercises routinely during rehab and at home. Improve flexibility and balance as measured by sit and reach test and single leg stand. Improve cardiorespiratory fitness as measured by METs and functional capacity (6MWT). receive MD clearance to return to jogging.           Core Components/Risk Factors/Patient Goals Review:    Core Components/Risk Factors/Patient Goals at Discharge (Final Review):      ITP Comments:  ITP Comments    Row Name 10/19/19 1103           ITP Comments Medical Director- Dr. Fransico Him, MD              Comments: Dr Golden Circle attended orientation on 10/19/2019 to review rules and guidelines for program.  Completed 6 minute walk test, Intitial ITP, and exercise prescription.  VSS. Telemetry-Sinus Rhythm with atrial pacing on demand..  Asymptomatic. Safety measures and social distancing in place per CDC guidelines.

## 2019-10-19 NOTE — Patient Instructions (Signed)
Description   Today take 1.5 tablets and 1 tablet tomorrow then continue taking 1 tablet daily except 1/2 tablet on Wednesday. Recheck INR in 1 week. Call coumadin clinic for any changes in medications or upcoming procedures, [phone]

## 2019-10-25 ENCOUNTER — Other Ambulatory Visit: Payer: Self-pay

## 2019-10-25 ENCOUNTER — Encounter (HOSPITAL_COMMUNITY)
Admission: RE | Admit: 2019-10-25 | Discharge: 2019-10-25 | Disposition: A | Discharge status: Home / Self Care | Payer: Medicare Other | Source: Ambulatory Visit | Attending: Cardiovascular Disease | Admitting: Cardiovascular Disease

## 2019-10-25 DIAGNOSIS — Z952 Presence of prosthetic heart valve: Secondary | ICD-10-CM | POA: Insufficient documentation

## 2019-10-25 NOTE — Progress Notes (Signed)
Daily Session Note  Patient Details  Name: Jerry Foster MRN: [id number] Date of Birth: November 25, 1950 Referring Provider:     [facility] from 10/19/2019 in [place]  Referring Provider Sherren Mocha, MD      Encounter Date: 10/25/2019  Check In:   Capillary Blood Glucose: No results found for this or any previous visit (from the past 24 hour(s)).   Exercise Prescription Changes - 10/25/19 1200      Response to Exercise   Blood Pressure (Admit) 102/78    Blood Pressure (Exercise) 154/84    Blood Pressure (Exit) 122/78    Heart Rate (Admit) 60 bpm    Heart Rate (Exercise) 75 bpm    Heart Rate (Exit) 60 bpm    Rating of Perceived Exertion (Exercise) 11    Symptoms None    Comments Pt's first day of exercise in [facility], pt tolerated session well.    Duration Progress to 30 minutes of  aerobic without signs/symptoms of physical distress    Intensity THRR unchanged      Progression   Progression Continue to progress workloads to maintain intensity without signs/symptoms of physical distress.    Average METs 3.6      Resistance Training   Training Prescription Yes    Weight 4 lbs    Reps 10-15      Interval Training   Interval Training No      Treadmill   MPH 3.3    Grade 1    Minutes 15    METs 3.98      NuStep   Level 4    SPM 85    Minutes 15    METs 3.2           Social History   Tobacco Use  Smoking Status Never Smoker  Smokeless Tobacco Never Used    Goals Met:  Exercise tolerated well No report of cardiac concerns or symptoms Strength training completed today  Goals Unmet:  Not Applicable  Comments: Dr Golden Circle started cardiac rehab today.  Pt tolerated light exercise without difficulty. VSS, telemetry-Sinus Rhythm, asymptomatic.  Medication list reconciled. Pt denies barriers to medicaiton compliance.  PSYCHOSOCIAL ASSESSMENT:  PHQ-0. Pt exhibits positive coping skills, hopeful outlook with  supportive family and firends. No psychosocial needs identified at this time, no psychosocial interventions necessary.    Pt enjoys landscaping.   Pt oriented to exercise equipment and routine.    Understanding verbalized.Harrell Gave RN BSN   Dr. Fransico Him is Medical Director for Cardiac Rehab at [facility].

## 2019-10-27 ENCOUNTER — Encounter (HOSPITAL_COMMUNITY)
Admission: RE | Admit: 2019-10-27 | Discharge: 2019-10-27 | Disposition: A | Discharge status: Home / Self Care | Payer: Medicare Other | Source: Ambulatory Visit | Attending: Cardiovascular Disease | Admitting: Cardiovascular Disease

## 2019-10-27 ENCOUNTER — Ambulatory Visit: Payer: Medicare Other

## 2019-10-27 ENCOUNTER — Other Ambulatory Visit: Payer: Self-pay

## 2019-10-27 DIAGNOSIS — Z953 Presence of xenogenic heart valve: Secondary | ICD-10-CM | POA: Diagnosis not present

## 2019-10-27 DIAGNOSIS — I4891 Unspecified atrial fibrillation: Secondary | ICD-10-CM | POA: Diagnosis not present

## 2019-10-27 DIAGNOSIS — I4819 Other persistent atrial fibrillation: Secondary | ICD-10-CM | POA: Diagnosis not present

## 2019-10-27 DIAGNOSIS — Z952 Presence of prosthetic heart valve: Secondary | ICD-10-CM

## 2019-10-27 LAB — POCT INR: INR: 1.7 — AB (ref 2.0–3.0)

## 2019-10-27 NOTE — Patient Instructions (Signed)
Description   Today take 1.5 tablets, then start taking 1 tablet daily.  Recheck INR in 2 weeks. Call coumadin clinic for any changes in medications or upcoming procedures, [phone]

## 2019-10-27 NOTE — Progress Notes (Signed)
Jerry Foster 69 y.o. male Nutrition Note  Visit Diagnosis: 07/23/19 AVR  Past Medical History:  Diagnosis Date  . Aortic stenosis   . Benign prostatic hyperplasia   . Bronchitis   . Cataract    bilateral   . CHF (congestive heart failure) ([place])   . Colon polyps   . Complete heart block ([place]) 07/28/2019  . Heart murmur   . History of fracture of vertebral column   . Hyperlipidemia   . Kidney stone   . Nonrheumatic aortic valve stenosis   . Osteoporosis   . Osteoporosis   . Pure hypercholesterolemia   . RBBB   . 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 Sensorineural hearing loss of both ears   . Thrombosed hemorrhoids    hsitory of  . Tubular adenoma of colon   . Vitamin D deficiency      Medications reviewed.   Current Outpatient Medications:  .  acetaminophen (TYLENOL) 500 MG tablet, Take 500 mg by mouth every 8 (eight) hours as needed for moderate pain or headache. (Patient not taking: Reported on 10/11/2019), Disp: , Rfl:  .  aspirin 81 MG EC tablet, Take 1 tablet (81 mg total) by mouth daily., Disp: 30 tablet, Rfl:  .  Calcium Citrate-Vitamin D (CITRACAL + D PO), Take 2 tablets by mouth in the morning and at bedtime., Disp: , Rfl:  .  Cholecalciferol (DIALYVITE VITAMIN D 5000) 125 MCG (5000 UT) capsule, Take 5,000 Units by mouth daily., Disp: , Rfl:  .  ibuprofen (ADVIL) 200 MG tablet, Take 400 mg by mouth every 6 (six) hours as needed for moderate pain. (Patient not taking: Reported on 10/11/2019), Disp: , Rfl:  .  metoprolol tartrate (LOPRESSOR) 50 MG tablet, Take 1 tablet (50 mg total) by mouth 2 (two) times daily., Disp: 180 tablet, Rfl: 3 .  Omega-3 1400 MG CAPS, Take 1,400 mg by mouth daily. (Patient not taking: Reported on 10/11/2019), Disp: , Rfl:  .  pravastatin (PRAVACHOL) 40 MG tablet, Take 40 mg by mouth., Disp:  , Rfl:  .  warfarin (COUMADIN) 2.5 MG tablet, Take 1 tablet (2.5 mg total) by mouth daily at 4 PM., Disp: 100 tablet, Rfl: 1   Ht Readings from Last 1 Encounters:  10/19/19 5\' 9"  (1.753 m)     Wt Readings from Last 3 Encounters:  10/19/19 161 lb 2.5 oz (73.1 kg)  09/07/19 156 lb (70.8 kg)  08/18/19 156 lb 6.4 oz (70.9 kg)     There is no height or weight on file to calculate BMI.   Social History   Tobacco Use  Smoking Status Never Smoker  Smokeless Tobacco Never Used     No results found for: CHOL No results found for: HDL No results found for: LDLCALC No results found for: TRIG   Lab Results  Component Value Date   HGBA1C 5.7 (H) 07/19/2019     CBG (last 3)  No results for input(s): GLUCAP in the last 72 hours.   Nutrition Note  Spoke with pt. Nutrition Plan and Nutrition Survey goals reviewed with pt. Pt is following a Heart Healthy diet.  For personal reasons, pt would like to lose about 5 lbs. Reviewed simple changes to make such as avoiding alcohol and limiting portions when eating out. We reviewed INR and vitamin k intake. He does not eat consistent vitamin k intake.  He tries to avoid most green vegetables but still has them occasionally. Reviewed amounts in dark leafy greens (cooked) vs other green veggies (broc,brussels, green beans, cabbage etc).  Pt expressed understanding of the information reviewed.   Nutrition Diagnosis ? Food medication interaction related to inconsistent food choices as evidenced by INR 1.7 and report of eating cooked collards one day last week/inconsistent vitamin k intake Nutrition Intervention ? Pt's individual nutrition plan reviewed with pt. ? Benefits of adopting Heart Healthy diet discussed when Medficts reviewed.   ? Consistent vitamin k intake each day ? Continue client-centered nutrition education by RD, as part of interdisciplinary care.  Goal(s) ? Pt to avoid dark leafy greens since he does not eat them daily ? Pt to  choose 1 cup veggies with moderate vitamin k daily to have consistent intake of vitamin k daily  Plan:   Will provide client-centered nutrition education as part of interdisciplinary care  Monitor and evaluate progress toward nutrition goal with team.   Michaele Offer, MS, RDN, LDN

## 2019-10-29 ENCOUNTER — Encounter (HOSPITAL_COMMUNITY)
Admission: RE | Admit: 2019-10-29 | Discharge: 2019-10-29 | Disposition: A | Discharge status: Home / Self Care | Payer: Medicare Other | Source: Ambulatory Visit | Attending: Cardiovascular Disease | Admitting: Cardiovascular Disease

## 2019-10-29 ENCOUNTER — Other Ambulatory Visit: Payer: Self-pay

## 2019-10-29 ENCOUNTER — Ambulatory Visit (INDEPENDENT_AMBULATORY_CARE_PROVIDER_SITE_OTHER): Payer: Medicare Other | Admitting: *Deleted

## 2019-10-29 DIAGNOSIS — Z952 Presence of prosthetic heart valve: Secondary | ICD-10-CM

## 2019-10-29 DIAGNOSIS — I442 Atrioventricular block, complete: Secondary | ICD-10-CM

## 2019-10-29 LAB — CUP PACEART REMOTE DEVICE CHECK
Battery Remaining Longevity: 95 mo
Battery Remaining Percentage: 95.5 %
Battery Voltage: 3.02 V
Brady Statistic AP VP Percent: 1 %
Brady Statistic AP VS Percent: 43 %
Brady Statistic AS VP Percent: 1 %
Brady Statistic AS VS Percent: 57 %
Brady Statistic RA Percent Paced: 42 %
Brady Statistic RV Percent Paced: 1 %
Date Time Interrogation Session: 20210806022226
Implantable Lead Implant Date: 20210506
Implantable Lead Implant Date: 20210506
Implantable Lead Location: 753859
Implantable Lead Location: 753860
Implantable Pulse Generator Implant Date: 20210506
Lead Channel Impedance Value: 510 Ohm
Lead Channel Impedance Value: 560 Ohm
Lead Channel Pacing Threshold Amplitude: 0.5 V
Lead Channel Pacing Threshold Amplitude: 0.5 V
Lead Channel Pacing Threshold Pulse Width: 0.4 ms
Lead Channel Pacing Threshold Pulse Width: 0.4 ms
Lead Channel Sensing Intrinsic Amplitude: 1.4 mV
Lead Channel Sensing Intrinsic Amplitude: 12 mV
Lead Channel Setting Pacing Amplitude: 3.5 V
Lead Channel Setting Pacing Amplitude: 3.5 V
Lead Channel Setting Pacing Pulse Width: 0.4 ms
Lead Channel Setting Sensing Sensitivity: 2 mV
Pulse Gen Model: 2272
Pulse Gen Serial Number: 3825759

## 2019-11-01 ENCOUNTER — Encounter (HOSPITAL_COMMUNITY)
Admission: RE | Admit: 2019-11-01 | Discharge: 2019-11-01 | Disposition: A | Discharge status: Home / Self Care | Payer: Medicare Other | Source: Ambulatory Visit | Attending: Cardiovascular Disease | Admitting: Cardiovascular Disease

## 2019-11-01 ENCOUNTER — Other Ambulatory Visit: Payer: Self-pay

## 2019-11-01 DIAGNOSIS — Z952 Presence of prosthetic heart valve: Secondary | ICD-10-CM | POA: Diagnosis not present

## 2019-11-01 NOTE — Progress Notes (Signed)
Remote pacemaker transmission.   

## 2019-11-03 ENCOUNTER — Encounter (HOSPITAL_COMMUNITY): Payer: Medicare Other

## 2019-11-03 NOTE — Progress Notes (Signed)
Cardiac Individual Treatment Plan  Patient Details  Name: Jerry Foster MRN: [id number] Date of Birth: 1950/04/05 Referring Provider:     [place] from 10/19/2019 in [place]  Referring Provider Sherren Mocha, MD      Initial Encounter Date:    CARDIAC REHAB PHASE II ORIENTATION from 10/19/2019 in [place]  Date 10/19/19      Visit Diagnosis: 07/23/19 AVR  Patient's Home Medications on Admission:  Current Outpatient Medications:  .  acetaminophen (TYLENOL) 500 MG tablet, Take 500 mg by mouth every 8 (eight) hours as needed for moderate pain or headache. (Patient not taking: Reported on 10/11/2019), Disp: , Rfl:  .  aspirin 81 MG EC tablet, Take 1 tablet (81 mg total) by mouth daily., Disp: 30 tablet, Rfl:  .  Calcium Citrate-Vitamin D (CITRACAL + D PO), Take 2 tablets by mouth in the morning and at bedtime., Disp: , Rfl:  .  Cholecalciferol (DIALYVITE VITAMIN D 5000) 125 MCG (5000 UT) capsule, Take 5,000 Units by mouth daily., Disp: , Rfl:  .  ibuprofen (ADVIL) 200 MG tablet, Take 400 mg by mouth every 6 (six) hours as needed for moderate pain. (Patient not taking: Reported on 10/11/2019), Disp: , Rfl:  .  metoprolol tartrate (LOPRESSOR) 50 MG tablet, Take 1 tablet (50 mg total) by mouth 2 (two) times daily., Disp: 180 tablet, Rfl: 3 .  Omega-3 1400 MG CAPS, Take 1,400 mg by mouth daily. (Patient not taking: Reported on 10/11/2019), Disp: , Rfl:  .  pravastatin (PRAVACHOL) 40 MG tablet, Take 40 mg by mouth., Disp: , Rfl:  .  warfarin (COUMADIN) 2.5 MG tablet, Take 1 tablet (2.5 mg total) by mouth daily at 4 PM., Disp: 100 tablet, Rfl: 1  Past Medical History: Past Medical History:  Diagnosis Date  . Aortic stenosis   . Benign prostatic hyperplasia   . Bronchitis   . Cataract    bilateral   . CHF (congestive heart failure) ([place])   . Colon polyps   . Complete heart block ([place]) 07/28/2019  .  Heart murmur   . History of fracture of vertebral column   . Hyperlipidemia   . Kidney stone   . Nonrheumatic aortic valve stenosis   . Osteoporosis   . Osteoporosis   . Pure hypercholesterolemia   . RBBB   . 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 Sensorineural hearing loss of both ears   . Thrombosed hemorrhoids    hsitory of  . Tubular adenoma of colon   . Vitamin D deficiency     Tobacco Use: Social History   Tobacco Use  Smoking Status Never Smoker  Smokeless Tobacco Never Used    Labs: Recent Review Flowsheet Data    Labs for ITP Cardiac and Pulmonary Rehab Latest Ref Rng & Units 07/23/2019 07/23/2019 07/23/2019 07/23/2019 07/24/2019   Hemoglobin A1c 4.8 - 5.6 % - - - - -   PHART 7.35 - 7.45 7.128(LL) 7.353 7.405 7.261(L) 7.364   PCO2ART 32 - 48 mmHg 76.1(HH) 36.6 36.5 50.9(H) 38.6   HCO3 20.0 - 28.0 mmol/L 25.2 20.4 22.9 22.8 22.0   TCO2 22 - 32 mmol/L 28 22 24 24 23    ACIDBASEDEF 0.0 - 2.0 mmol/L 6.0(H) 5.0(H) 2.0 5.0(H) 3.0(H)   O2SAT % 94.0 99.0 99.0 98.0 98.0  Capillary Blood Glucose: Lab Results  Component Value Date   GLUCAP 123 (H) 07/28/2019   GLUCAP 122 (H) 07/28/2019   GLUCAP 121 (H) 07/27/2019   GLUCAP 128 (H) 07/27/2019   GLUCAP 94 07/27/2019     Exercise Target Goals: Exercise Program Goal: Individual exercise prescription set using results from initial 6 min walk test and THRR while considering  patient's activity barriers and safety.   Exercise Prescription Goal: Starting with aerobic activity 30 plus minutes a day, 3 days per week for initial exercise prescription. Provide home exercise prescription and guidelines that participant acknowledges understanding prior to discharge.  Activity Barriers & Risk Stratification:  Activity Barriers & Cardiac Risk Stratification - 10/19/19 0904        Activity Barriers & Cardiac Risk Stratification   Activity Barriers None    Cardiac Risk Stratification High           6 Minute Walk:  6 Minute Walk    Row Name 10/19/19 0913         6 Minute Walk   Phase Initial     Distance 2335 feet     Walk Time 6 minutes     # of Rest Breaks 0     MPH 4.42     METS 4.98     RPE 13     Perceived Dyspnea  0     VO2 Peak 17.42     Symptoms No     Resting HR 60 bpm     Resting BP 124/70     Resting Oxygen Saturation  97 %     Exercise Oxygen Saturation  during 6 min walk 98 %     Max Ex. HR 84 bpm     Max Ex. BP 158/82     2 Minute Post BP 122/80            Oxygen Initial Assessment:   Oxygen Re-Evaluation:   Oxygen Discharge (Final Oxygen Re-Evaluation):   Initial Exercise Prescription:  Initial Exercise Prescription - 10/19/19 1100      Date of Initial Exercise RX and Referring Provider   Date 10/19/19    Referring Provider Sherren Mocha, MD    Expected Discharge Date 12/17/19      Treadmill   MPH 3.2    Grade 1    Minutes 15    METs 3.89      NuStep   Level 4    SPM 85    Minutes 15    METs 3      Prescription Details   Frequency (times per week) 3    Duration Progress to 30 minutes of continuous aerobic without signs/symptoms of physical distress      Intensity   THRR 40-80% of Max Heartrate 60-120    Ratings of Perceived Exertion 11-13    Perceived Dyspnea 0-4      Progression   Progression Continue to progress workloads to maintain intensity without signs/symptoms of physical distress.      Resistance Training   Training Prescription Yes    Weight 4lbs    Reps 10-15           Perform Capillary Blood Glucose checks as needed.  Exercise Prescription Changes:   Exercise Prescription Changes    Row Name 10/25/19 1200 11/03/19 1233           Response to Exercise   Blood Pressure (Admit) 102/78 126/76      Blood Pressure (Exercise) 154/84 130/80  Blood Pressure (Exit) 122/78  122/72      Heart Rate (Admit) 60 bpm 60 bpm      Heart Rate (Exercise) 75 bpm 89 bpm      Heart Rate (Exit) 60 bpm 69 bpm      Rating of Perceived Exertion (Exercise) 11 11      Perceived Dyspnea (Exercise) -- 0      Symptoms None None      Comments Pt's first day of exercise in [facility], pt tolerated session well. Pt very eager to increase workload, will work with pt to progress safely and gradually      Duration Progress to 30 minutes of  aerobic without signs/symptoms of physical distress Continue with 30 min of aerobic exercise without signs/symptoms of physical distress.      Intensity THRR unchanged THRR unchanged        Progression   Progression Continue to progress workloads to maintain intensity without signs/symptoms of physical distress. Continue to progress workloads to maintain intensity without signs/symptoms of physical distress.      Average METs 3.6 4.7        Resistance Training   Training Prescription Yes No      Weight 4 lbs --      Reps 10-15 --        Interval Training   Interval Training No No        Treadmill   MPH 3.3 3.3      Grade 1 5      Minutes 15 15      METs 3.98 5.8        NuStep   Level 4 8      SPM 85 95      Minutes 15 15      METs 3.2 3.6             Exercise Comments:   Exercise Comments    Row Name 10/25/19 1257 11/04/19 1234         Exercise Comments Pt's first day of exercise. Pt tolerated exercise session well. Pt is tolerating exercise well. Pt is very eager to increase workloads, too quickly. Working with pt to learn how to safely progress workloads and gradually. Will continue to monitor.             Exercise Goals and Review:   Exercise Goals    Row Name 10/19/19 0904             Exercise Goals   Increase Physical Activity Yes       Intervention Provide advice, education, support and counseling about physical activity/exercise needs.;Develop an individualized exercise prescription for aerobic and resistive  training based on initial evaluation findings, risk stratification, comorbidities and participant's personal goals.       Expected Outcomes Short Term: Attend rehab on a regular basis to increase amount of physical activity.;Long Term: Exercising regularly at least 3-5 days a week.;Long Term: Add in home exercise to make exercise part of routine and to increase amount of physical activity.       Increase Strength and Stamina Yes       Intervention Provide advice, education, support and counseling about physical activity/exercise needs.;Develop an individualized exercise prescription for aerobic and resistive training based on initial evaluation findings, risk stratification, comorbidities and participant's personal goals.       Expected Outcomes Short Term: Increase workloads from initial exercise prescription for resistance, speed, and METs.;Short Term: Perform resistance training exercises routinely during rehab and  add in resistance training at home;Long Term: Improve cardiorespiratory fitness, muscular endurance and strength as measured by increased METs and functional capacity (6MWT)       Able to understand and use rate of perceived exertion (RPE) scale Yes       Intervention Provide education and explanation on how to use RPE scale       Expected Outcomes Short Term: Able to use RPE daily in rehab to express subjective intensity level;Long Term:  Able to use RPE to guide intensity level when exercising independently       Knowledge and understanding of Target Heart Rate Range (THRR) Yes       Intervention Provide education and explanation of THRR including how the numbers were predicted and where they are located for reference       Expected Outcomes Short Term: Able to state/look up THRR;Long Term: Able to use THRR to govern intensity when exercising independently;Short Term: Able to use daily as guideline for intensity in rehab       Able to check pulse independently Yes       Intervention  Provide education and demonstration on how to check pulse in carotid and radial arteries.;Review the importance of being able to check your own pulse for safety during independent exercise       Expected Outcomes Short Term: Able to explain why pulse checking is important during independent exercise;Long Term: Able to check pulse independently and accurately       Understanding of Exercise Prescription Yes       Intervention Provide education, explanation, and written materials on patient's individual exercise prescription       Expected Outcomes Short Term: Able to explain program exercise prescription;Long Term: Able to explain home exercise prescription to exercise independently              Exercise Goals Re-Evaluation :  Exercise Goals Re-Evaluation    Row Name 10/25/19 1255             Exercise Goal Re-Evaluation   Exercise Goals Review Increase Physical Activity;Increase Strength and Stamina;Able to understand and use rate of perceived exertion (RPE) scale;Knowledge and understanding of Target Heart Rate Range (THRR);Able to check pulse independently;Understanding of Exercise Prescription       Comments Pt's first day in [facility] program. Pt tolerated the exercise bout well and understands the exercise Rx, RPE scale and THRR.       Expected Outcomes Will continue to monitor patient and progress as tolerated.               Discharge Exercise Prescription (Final Exercise Prescription Changes):  Exercise Prescription Changes - 11/03/19 1233      Response to Exercise   Blood Pressure (Admit) 126/76    Blood Pressure (Exercise) 130/80    Blood Pressure (Exit) 122/72    Heart Rate (Admit) 60 bpm    Heart Rate (Exercise) 89 bpm    Heart Rate (Exit) 69 bpm    Rating of Perceived Exertion (Exercise) 11    Perceived Dyspnea (Exercise) 0    Symptoms None    Comments Pt very eager to increase workload, will work with pt to progress safely and gradually    Duration Continue with 30  min of aerobic exercise without signs/symptoms of physical distress.    Intensity THRR unchanged      Progression   Progression Continue to progress workloads to maintain intensity without signs/symptoms of physical distress.    Average METs 4.7  Resistance Training   Training Prescription No      Interval Training   Interval Training No      Treadmill   MPH 3.3    Grade 5    Minutes 15    METs 5.8      NuStep   Level 8    SPM 95    Minutes 15    METs 3.6           Nutrition:  Target Goals: Understanding of nutrition guidelines, daily intake of sodium 1500mg , cholesterol 200mg , calories 30% from fat and 7% or less from saturated fats, daily to have 5 or more servings of fruits and vegetables.  Biometrics:  Pre Biometrics - 10/19/19 0909      Pre Biometrics   Height 5\' 9"  (1.753 m)    Waist Circumference 32.5 inches    Hip Circumference 38.25 inches    Waist to Hip Ratio 0.85 %    BMI (Calculated) 23.79    Triceps Skinfold 9 mm    % Body Fat 20.5 %    Grip Strength 40.5 kg    Flexibility 13.13 in    Single Leg Stand 30 seconds            Nutrition Therapy Plan and Nutrition Goals:  Nutrition Therapy & Goals - 10/28/19 0707      Nutrition Therapy   Diet Heart Healthy    Drug/Food Interactions Coumadin/Vit K      Personal Nutrition Goals   Nutrition Goal Pt to avoid dark leafy greens since he does not eat them daily    Personal Goal #2 Pt to choose 1 cup veggies with moderate vitamin k daily to have consistent intake of vitamin k daily      Intervention Plan   Intervention Prescribe, educate and counsel regarding individualized specific dietary modifications aiming towards targeted core components such as weight, hypertension, lipid management, diabetes, heart failure and other comorbidities.;Nutrition handout(s) given to patient.    Expected Outcomes Short Term Goal: A plan has been developed with personal nutrition goals set during dietitian  appointment.           Nutrition Assessments:  Nutrition Assessments - 10/28/19 0711      MEDFICTS Scores   Pre Score 48           Nutrition Goals Re-Evaluation:  Nutrition Goals Re-Evaluation    Row Name 10/28/19 0711             Goals   Current Weight 161 lb (73 kg)       Nutrition Goal Pt to avoid dark leafy greens since he does not eat them daily         Personal Goal #2 Re-Evaluation   Personal Goal #2 Pt to choose 1 cup veggies with moderate vitamin k daily to have consistent intake of vitamin k daily              Nutrition Goals Discharge (Final Nutrition Goals Re-Evaluation):  Nutrition Goals Re-Evaluation - 10/28/19 0711      Goals   Current Weight 161 lb (73 kg)    Nutrition Goal Pt to avoid dark leafy greens since he does not eat them daily      Personal Goal #2 Re-Evaluation   Personal Goal #2 Pt to choose 1 cup veggies with moderate vitamin k daily to have consistent intake of vitamin k daily           Psychosocial: Target Goals: Acknowledge presence or absence  of significant depression and/or stress, maximize coping skills, provide positive support system. Participant is able to verbalize types and ability to use techniques and skills needed for reducing stress and depression.  Initial Review & Psychosocial Screening:  Initial Psych Review & Screening - 10/19/19 1209      Initial Review   Current issues with None Identified      Family Dynamics   Good Support System? Yes   [name] lives alone but has friends nearby for support. Jim's twin brother lives in [place]     Barriers   Psychosocial barriers to participate in program There are no identifiable barriers or psychosocial needs.      Screening Interventions   Interventions Encouraged to exercise           Quality of Life Scores:  Quality of Life - 10/19/19 1116      Quality of Life   Select Quality of Life      Quality of Life Scores   Health/Function Pre 26.89 %     Socioeconomic Pre 29.17 %    Psych/Spiritual Pre 21.7 %    Family Pre 30 %    GLOBAL Pre 26.67 %          Scores of 19 and below usually indicate a poorer quality of life in these areas.  A difference of  2-3 points is a clinically meaningful difference.  A difference of 2-3 points in the total score of the Quality of Life Index has been associated with significant improvement in overall quality of life, self-image, physical symptoms, and general health in studies assessing change in quality of life.  PHQ-9: Recent Review Flowsheet Data    Depression screen [facility] 2/9 10/19/2019   Decreased Interest 0   Down, Depressed, Hopeless 0   PHQ - 2 Score 0     Interpretation of Total Score  Total Score Depression Severity:  1-4 = Minimal depression, 5-9 = Mild depression, 10-14 = Moderate depression, 15-19 = Moderately severe depression, 20-27 = Severe depression   Psychosocial Evaluation and Intervention:   Psychosocial Re-Evaluation:  Psychosocial Re-Evaluation    [place] Name 11/03/19 0932             Psychosocial Re-Evaluation   Current issues with None Identified       Interventions Encouraged to attend Cardiac Rehabilitation for the exercise       Continue Psychosocial Services  No Follow up required              Psychosocial Discharge (Final Psychosocial Re-Evaluation):  Psychosocial Re-Evaluation - 11/03/19 0932      Psychosocial Re-Evaluation   Current issues with None Identified    Interventions Encouraged to attend Cardiac Rehabilitation for the exercise    Continue Psychosocial Services  No Follow up required           Vocational Rehabilitation: Provide vocational rehab assistance to qualifying candidates.   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 Participant will state understanding/return demonstration of topics presented.  Learning Barriers/Preferences:  Learning Barriers/Preferences - 10/19/19 1211      Learning Barriers/Preferences   Learning Barriers None  Learning Preferences Audio;Individual Instruction;Skilled Demonstration;Verbal Instruction           Education Topics: Hypertension, Hypertension Reduction -Define heart disease and high blood pressure. Discus how high blood pressure affects the body and ways to reduce high blood pressure.   Exercise and Your Heart -Discuss why it is important to exercise, the FITT principles of exercise, normal and abnormal responses to exercise, and how to exercise safely.   Angina -Discuss definition of angina, causes of angina, treatment of angina, and how to decrease risk of having angina.   Cardiac Medications -Review what the following cardiac medications are used for, how they affect the body, and side effects that may occur when taking the medications.  Medications include Aspirin, Beta blockers, calcium channel blockers, ACE Inhibitors, angiotensin receptor blockers, diuretics, digoxin, and antihyperlipidemics.   Congestive Heart Failure -Discuss the definition of CHF, how to live with CHF, the signs and symptoms of CHF, and how keep track of weight and sodium intake.   Heart Disease and Intimacy -Discus the effect sexual activity has on the heart, how changes occur during intimacy as we age, and safety during sexual activity.   Smoking Cessation / COPD -Discuss different methods to quit smoking, the health benefits of quitting smoking, and the definition of COPD.   Nutrition I: Fats -Discuss the types of cholesterol, what cholesterol does to the heart, and how cholesterol levels can be controlled.   Nutrition II:  Labels -Discuss the different components of food labels and how to read food label   Heart Parts/Heart Disease and PAD -Discuss the anatomy of the heart, the pathway of blood circulation through the heart, and these are affected by heart disease.   Stress I: Signs and Symptoms -Discuss the causes of stress, how stress may lead to anxiety and depression, and ways to limit stress.   Stress II: Relaxation -Discuss different types of relaxation techniques to limit stress.   Warning Signs of Stroke / TIA -Discuss definition of a stroke, what the signs and symptoms are of a stroke, and how to identify when someone is having stroke.   Knowledge Questionnaire Score:  Knowledge Questionnaire Score - 10/19/19 1116      Knowledge Questionnaire Score   Pre Score 21/24           Core Components/Risk Factors/Patient Goals at Admission:  Personal Goals and Risk Factors at Admission - 10/19/19 0904      Core Components/Risk Factors/Patient Goals on Admission    Weight Management Yes;Weight Loss    Intervention Weight Management: Provide education and appropriate resources to help participant work on and attain dietary goals.;Weight Management: Develop a combined nutrition and exercise program designed to reach desired caloric intake, while maintaining appropriate intake of nutrient and fiber, sodium and fats, and appropriate energy expenditure required for the weight goal.    Admit Weight 161 lb 2.5 oz (73.1 kg)    Goal Weight: Short Term 155 lb (70.3 kg)    Goal Weight: Long Term 151 lb (68.5 kg)    Expected Outcomes Short Term: Continue to assess and modify interventions until short term weight is achieved;Long Term: Adherence to nutrition and physical activity/exercise program aimed toward attainment of established weight goal;Weight Loss: Understanding of general recommendations for a balanced deficit meal plan, which promotes 1-2 lb weight loss per week and includes a negative energy  balance of [phone] kcal/d;Understanding recommendations for meals to include 15-35% energy as protein, 25-35% energy from fat, 35-60% energy from carbohydrates, less than 200mg   of dietary cholesterol, 20-35 gm of total fiber daily;Understanding of distribution of calorie intake throughout the day with the consumption of 4-5 meals/snacks    Lipids Yes    Intervention Provide education and support for participant on nutrition & aerobic/resistive exercise along with prescribed medications to achieve LDL 70mg , HDL >40mg .    Expected Outcomes Short Term: Participant states understanding of desired cholesterol values and is compliant with medications prescribed. Participant is following exercise prescription and nutrition guidelines.;Long Term: Cholesterol controlled with medications as prescribed, with individualized exercise RX and with personalized nutrition plan. Value goals: LDL < 70mg , HDL > 40 mg.    Personal Goal Other Yes    Personal Goal Improve flexibility and balance. Return to jogging.    Intervention Provide advice, education, support and counseling about physical activity/exercise needs.; Develop an individualized exercise prescription for aerobic and resistive training based on initial evaluation findings, risk stratification, comorbidities and participant's personal goals.    Expected Outcomes Perform stretching exercises routinely during rehab and at home. Improve flexibility and balance as measured by sit and reach test and single leg stand. Improve cardiorespiratory fitness as measured by METs and functional capacity (6MWT). receive MD clearance to return to jogging.           Core Components/Risk Factors/Patient Goals Review:   Goals and Risk Factor Review    Row Name 11/03/19 0933             Core Components/Risk Factors/Patient Goals Review   Personal Goals Review Weight Management/Obesity;Lipids;Other       Review [name] is off to a good start to exercise. Jim's vital signs  have been stable. [name] has been anxious to increase his work loads. Patient has been advised to increase his work loads slowly.       Expected Outcomes [name] will continue to participate in phase 2 cardiac rehab for exercise, nutrition and lifestyle modification              Core Components/Risk Factors/Patient Goals at Discharge (Final Review):   Goals and Risk Factor Review - 11/03/19 0933      Core Components/Risk Factors/Patient Goals Review   Personal Goals Review Weight Management/Obesity;Lipids;Other    Review [name] is off to a good start to exercise. Jim's vital signs have been stable. [name] has been anxious to increase his work loads. Patient has been advised to increase his work loads slowly.    Expected Outcomes [name] will continue to participate in phase 2 cardiac rehab for exercise, nutrition and lifestyle modification           ITP Comments:  ITP Comments    Row Name 10/19/19 1103 11/03/19 0931         ITP Comments Medical Director- Dr. Fransico Him, MD 30 Day ITP. JIm has good atttendance and participation in phase 2 cardiac rehab. [name] is off to a good start to exercise             Comments: See ITP comments. [name] has decided to participate in phase 2 cardiac rehab twice a week due to other obligations.Barnet Pall, RN,BSN 11/04/2019 1:28 PM

## 2019-11-05 ENCOUNTER — Encounter (HOSPITAL_COMMUNITY): Payer: Medicare Other

## 2019-11-08 ENCOUNTER — Encounter: Payer: Self-pay | Admitting: Internal Medicine

## 2019-11-08 ENCOUNTER — Other Ambulatory Visit: Payer: Self-pay

## 2019-11-08 ENCOUNTER — Encounter (HOSPITAL_COMMUNITY)
Admission: RE | Admit: 2019-11-08 | Discharge: 2019-11-08 | Disposition: A | Discharge status: Home / Self Care | Payer: Medicare Other | Source: Ambulatory Visit | Attending: Cardiovascular Disease | Admitting: Cardiovascular Disease

## 2019-11-08 ENCOUNTER — Ambulatory Visit: Payer: Medicare Other | Admitting: Internal Medicine

## 2019-11-08 VITALS — BP 130/68 | HR 62 | Ht 69.5 in | Wt 162.3 lb

## 2019-11-08 DIAGNOSIS — Z950 Presence of cardiac pacemaker: Secondary | ICD-10-CM | POA: Diagnosis not present

## 2019-11-08 DIAGNOSIS — I480 Paroxysmal atrial fibrillation: Secondary | ICD-10-CM | POA: Diagnosis not present

## 2019-11-08 DIAGNOSIS — Z952 Presence of prosthetic heart valve: Secondary | ICD-10-CM

## 2019-11-08 DIAGNOSIS — I442 Atrioventricular block, complete: Secondary | ICD-10-CM

## 2019-11-08 LAB — CUP PACEART INCLINIC DEVICE CHECK
Battery Remaining Longevity: 141 mo
Battery Voltage: 3.02 V
Brady Statistic RA Percent Paced: 44 %
Brady Statistic RV Percent Paced: 0.6 %
Date Time Interrogation Session: 20210816102854
Implantable Lead Implant Date: 20210506
Implantable Lead Implant Date: 20210506
Implantable Lead Location: 753859
Implantable Lead Location: 753860
Implantable Pulse Generator Implant Date: 20210506
Lead Channel Impedance Value: 512.5 Ohm
Lead Channel Impedance Value: 537.5 Ohm
Lead Channel Pacing Threshold Amplitude: 0.625 V
Lead Channel Pacing Threshold Amplitude: 0.625 V
Lead Channel Pacing Threshold Pulse Width: 0.4 ms
Lead Channel Pacing Threshold Pulse Width: 0.4 ms
Lead Channel Sensing Intrinsic Amplitude: 12 mV
Lead Channel Sensing Intrinsic Amplitude: 2.3 mV
Lead Channel Setting Pacing Amplitude: 0.875
Lead Channel Setting Pacing Amplitude: 1.625
Lead Channel Setting Pacing Pulse Width: 0.4 ms
Lead Channel Setting Sensing Sensitivity: 2 mV
Pulse Gen Model: 2272
Pulse Gen Serial Number: 3825759

## 2019-11-08 MED ORDER — METOPROLOL TARTRATE 25 MG PO TABS
25.0000 mg | ORAL_TABLET | Freq: Two times a day (BID) | ORAL | 3 refills | Status: DC
Start: 2019-11-08 — End: 2020-06-14

## 2019-11-08 NOTE — Progress Notes (Signed)
PCP: Jefm Petty, MD Primary Cardiologist: Dr Burt Knack Primary EP:  Dr Jerry Foster Jerry Foster is a 69 y.o. male who presents today for routine electrophysiology followup.  Since his PPM implant, the patient reports doing very well.  Today, he denies symptoms of palpitations, chest pain, shortness of breath,  lower extremity edema, dizziness, presyncope, or syncope.  The patient is otherwise without complaint today.   Past Medical History:  Diagnosis Date  . Aortic stenosis   . Benign prostatic hyperplasia   . Bronchitis   . Cataract    bilateral   . CHF (congestive heart failure) ([employer])   . Colon polyps   . Complete heart block ([place]) 07/28/2019  . Heart murmur   . History of fracture of vertebral column   . Hyperlipidemia   . Kidney stone   . Nonrheumatic aortic valve stenosis   . Osteoporosis   . Osteoporosis   . Pure hypercholesterolemia   . RBBB   . 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 Sensorineural hearing loss of both ears   . Thrombosed hemorrhoids    hsitory of  . Tubular adenoma of colon   . Vitamin D deficiency    Past Surgical History:  Procedure Laterality Date  . AORTIC VALVE REPLACEMENT N/A 07/23/2019   Procedure: MINIMALLY INVASIVE AORTIC VALVE REPLACEMENT (AVR) using Margaretha Sheffield Resilia 21 MM Aortic Valve.;  Surgeon: Rexene Alberts, MD;  Location: [facility];  Service: Open Heart Surgery;  Laterality: N/A;  . CARDIAC CATHETERIZATION    . COLON SURGERY  2006   1 foot ascending colon removed   . COLONOSCOPY    . HERNIA REPAIR    . PACEMAKER IMPLANT N/A 07/29/2019   Procedure: PACEMAKER IMPLANT;  Surgeon: Jerry Grayer, MD;  Location: [facility];  Service: Cardiovascular;  Laterality: N/A;  . POLYPECTOMY    . RIGHT HEART CATH AND CORONARY ANGIOGRAPHY N/A 06/08/2019   Procedure: RIGHT HEART CATH  AND CORONARY ANGIOGRAPHY;  Surgeon: Sherren Mocha, MD;  Location: [facility];  Service: Cardiovascular;  Laterality: N/A;  . TEE WITHOUT CARDIOVERSION N/A 07/23/2019   Procedure: TRANSESOPHAGEAL ECHOCARDIOGRAM (TEE);  Surgeon: Rexene Alberts, MD;  Location: [facility];  Service: Open Heart Surgery;  Laterality: N/A;  . TESTICLE SURGERY      ROS- all systems are reviewed and negative except as per HPI above  Current Outpatient Medications  Medication Sig Dispense Refill  . acetaminophen (TYLENOL) 500 MG tablet Take 500 mg by mouth every 8 (eight) hours as needed for moderate pain or headache.     [name] aspirin 81 MG EC tablet Take 1 tablet (81 mg total) by mouth daily. 30 tablet   . Calcium Citrate-Vitamin D (CITRACAL + D PO) Take 2 tablets by mouth in the morning and at bedtime.    . Cholecalciferol (DIALYVITE VITAMIN D 5000) 125 MCG (5000 UT) capsule Take 5,000 Units by mouth daily.    [name] ibuprofen (ADVIL) 200 MG tablet Take 400 mg by mouth every 6 (six) hours as needed for moderate pain.     . metoprolol tartrate (LOPRESSOR) 50 MG tablet Take 1 tablet (50 mg total) by mouth 2 (two) times daily. 180 tablet 3  . Omega-3 1400 MG CAPS Take 1,400 mg by mouth daily.     . pravastatin (PRAVACHOL) 40 MG tablet Take 40 mg by mouth.    [name]  warfarin (COUMADIN) 2.5 MG tablet Take 1 tablet (2.5 mg total) by mouth daily at 4 PM. 100 tablet 1   No current facility-administered medications for this visit.    Physical Exam: Vitals:   11/08/19 0948  BP: 130/68  Pulse: 62  SpO2: 95%  Weight: 162 lb 4.8 oz (73.6 kg)  Height: 5' 9.5" (1.765 m)    GEN- The patient is well appearing, alert and oriented x 3 today.   Head- normocephalic, atraumatic Eyes-  Sclera clear, conjunctiva pink Ears- hearing intact Oropharynx- clear Lungs- Clear to ausculation bilaterally, normal work of breathing Chest- pacemaker pocket is well healed Heart- Regular rate and rhythm, no murmurs, rubs or gallops, PMI not  laterally displaced GI- soft, NT, ND, + BS Extremities- no clubbing, cyanosis, or edema  Pacemaker interrogation- reviewed in detail today,  See PACEART report  ekg tracing ordered today is personally reviewed and shows sinus with PR 204 msec, RBBB  Assessment and Plan:  1. Symptomatic transient complete heart block Normal pacemaker function AV block observed following minimally invasive AVR post operatively See Pace Art report Outputs changes to chronic settings he is not device dependant today I am optimistic that he will not V pace very often though he does have chronic conduction system disease  2. Persistent afib No episodes post PPM implant off amiodarone He is on coumadin chads2vasc score is 1 (age) I think he could stop coumadin.  He sees Dr Roxy Manns in a couple weeks. I will let him ultimately decide. If we stop [place] therapy and find more sustained recurrent afib, we could consider DOAC down the road, though guidelines do not require [place] for him long term given his low chads2vasc score. Wean metoprolol to 25mg  BID at this time We could wean further on return  3. S/p  minimally invasive AVR (bioprosthetic) Doing well s/p AVR  Risks, benefits and potential toxicities for medications prescribed and/or refilled reviewed with patient today.   Return to see me in a year  Jerry Grayer MD, [facility] 11/08/2019 10:17 AM

## 2019-11-08 NOTE — Patient Instructions (Addendum)
Medication Instructions:  1 Reduce your Metoprolol 25 mg   Take one tablet by mouth 2 times a day  *If you need a refill on your cardiac medications before your next appointment, please call your pharmacy*  Lab Work: None ordered.  If you have labs (blood work) drawn today and your tests are completely normal, you will receive your results only by:  [place] (if you have MyChart) OR  A paper copy in the mail If you have any lab test that is abnormal or we need to change your treatment, we will call you to review the results.  Testing/Procedures: None ordered.  Follow-Up: At [facility], you and your health needs are our priority.  As part of our continuing mission to provide you with exceptional heart care, we have created designated Provider Care Teams.  These Care Teams include your primary Cardiologist (physician) and Advanced Practice Providers (APPs -  Physician Assistants and Nurse Practitioners) who all work together to provide you with the care you need, when you need it.  We recommend signing up for the patient portal called "MyChart".  Sign up information is provided on this After Visit Summary.  MyChart is used to connect with patients for Virtual Visits (Telemedicine).  Patients are able to view lab/test results, encounter notes, upcoming appointments, etc.  Non-urgent messages can be sent to your provider as well.   To learn more about what you can do with MyChart, go to [email or web address].    Your next appointment:   Your physician wants you to follow-up in: 1 year with Dr. Rayann Heman. You will receive a reminder letter in the mail two months in advance. If you don't receive a letter, please call our office to schedule the follow-up appointment.    Other Instructions:

## 2019-11-10 ENCOUNTER — Other Ambulatory Visit: Payer: Self-pay

## 2019-11-10 ENCOUNTER — Encounter (HOSPITAL_COMMUNITY): Payer: Medicare Other

## 2019-11-10 ENCOUNTER — Ambulatory Visit (INDEPENDENT_AMBULATORY_CARE_PROVIDER_SITE_OTHER): Payer: Medicare Other | Admitting: Pharmacist

## 2019-11-10 DIAGNOSIS — I4819 Other persistent atrial fibrillation: Secondary | ICD-10-CM

## 2019-11-10 DIAGNOSIS — I4891 Unspecified atrial fibrillation: Secondary | ICD-10-CM

## 2019-11-10 DIAGNOSIS — Z953 Presence of xenogenic heart valve: Secondary | ICD-10-CM

## 2019-11-10 LAB — POCT INR: INR: 1.7 — AB (ref 2.0–3.0)

## 2019-11-10 NOTE — Patient Instructions (Signed)
Description   Take 2 tablets today, then start taking 1 tablet daily except 2 tablets on Wednesdays.  Recheck INR in 2 weeks. Call coumadin clinic for any changes in medications or upcoming procedures, [phone]

## 2019-11-12 ENCOUNTER — Other Ambulatory Visit: Payer: Self-pay

## 2019-11-12 ENCOUNTER — Encounter (HOSPITAL_COMMUNITY)
Admission: RE | Admit: 2019-11-12 | Discharge: 2019-11-12 | Disposition: A | Discharge status: Home / Self Care | Payer: Medicare Other | Source: Ambulatory Visit | Attending: Cardiovascular Disease | Admitting: Cardiovascular Disease

## 2019-11-12 DIAGNOSIS — Z952 Presence of prosthetic heart valve: Secondary | ICD-10-CM | POA: Diagnosis not present

## 2019-11-15 ENCOUNTER — Other Ambulatory Visit: Payer: Self-pay

## 2019-11-15 ENCOUNTER — Encounter (HOSPITAL_COMMUNITY)
Admission: RE | Admit: 2019-11-15 | Discharge: 2019-11-15 | Disposition: A | Discharge status: Home / Self Care | Payer: Medicare Other | Source: Ambulatory Visit | Attending: Cardiovascular Disease | Admitting: Cardiovascular Disease

## 2019-11-15 ENCOUNTER — Encounter: Payer: Self-pay | Admitting: Thoracic Surgery (Cardiothoracic Vascular Surgery)

## 2019-11-15 ENCOUNTER — Ambulatory Visit (INDEPENDENT_AMBULATORY_CARE_PROVIDER_SITE_OTHER): Payer: Medicare Other | Admitting: Thoracic Surgery (Cardiothoracic Vascular Surgery)

## 2019-11-15 VITALS — BP 131/86 | HR 68 | Temp 97.3°F | Resp 20 | Ht 69.5 in | Wt 161.2 lb

## 2019-11-15 DIAGNOSIS — Z952 Presence of prosthetic heart valve: Secondary | ICD-10-CM | POA: Diagnosis not present

## 2019-11-15 DIAGNOSIS — Z953 Presence of xenogenic heart valve: Secondary | ICD-10-CM

## 2019-11-15 NOTE — Progress Notes (Signed)
[address]       [place] [address]             [phone]     CARDIOTHORACIC SURGERY OFFICE NOTE  Referring Provider is Sherren Mocha, MD  Electrophysiology Consultant is [name], MD PCP is Jefm Petty, MD   HPI:  Patient is a 69 year old gentleman who returns the office today for routine follow-up status post minimally invasive aortic valve replacement using a stented bioprosthetic tissue valve via right minithoracotomy approach on July 23, 2019 for bicuspid aortic valve with stage D1 severe symptomatic aortic stenosis.  The patient's early postoperative recovery in the hospital was notable for the development of postoperative atrial fibrillation as well as complete heart block.  He ultimately underwent permanent pacemaker placement and was discharged from the hospital on warfarin anticoagulation.  He was last seen here in our office on Aug 17, 2019 at which time he was doing very well.  Routine follow-up echocardiogram performed September 14, 2019 revealed normal left ventricular systolic function with normal functioning bioprosthetic tissue valve in the aortic position.  Left ventricular ejection fraction was estimated 50 to 55%.  Mean transvalvular gradient across the aortic valve was estimated 9 mmHg.  There was no aortic insufficiency.  The patient was recently seen in follow-up by Dr. Rayann Heman in the atrial fibrillation clinic.  The patient was noted to have normal pacemaker function and interrogation of his pacemaker revealed no episodes of mode switching to suggest any recurrence of atrial fibrillation since his pacemaker was placed.  The possibility of stopping warfarin anticoagulation was suggested at that time.  Patient returns to our office and reports that he is doing very well.  He is back to essentially normal physical activity without any restrictions.  He has never had much of any soreness in his chest.  He has not had any palpitations or other  symptoms to suggest a recurrence of atrial fibrillation.  He has no shortness of breath.  Overall he is pleased with his outcome.   Current Outpatient Medications  Medication Sig Dispense Refill  . acetaminophen (TYLENOL) 500 MG tablet Take 500 mg by mouth every 8 (eight) hours as needed for moderate pain or headache.     [name] aspirin 81 MG EC tablet Take 1 tablet (81 mg total) by mouth daily. 30 tablet   . Calcium Citrate-Vitamin D (CITRACAL + D PO) Take 2 tablets by mouth in the morning and at bedtime.    . Cholecalciferol (DIALYVITE VITAMIN D 5000) 125 MCG (5000 UT) capsule Take 5,000 Units by mouth daily.    [name] ibuprofen (ADVIL) 200 MG tablet Take 400 mg by mouth every 6 (six) hours as needed for moderate pain.     . metoprolol tartrate (LOPRESSOR) 25 MG tablet Take 1 tablet (25 mg total) by mouth 2 (two) times daily. 180 tablet 3  . Omega-3 1400 MG CAPS Take 1,400 mg by mouth daily.     . pravastatin (PRAVACHOL) 40 MG tablet Take 40 mg by mouth.    . warfarin (COUMADIN) 2.5 MG tablet Take 1 tablet (2.5 mg total) by mouth daily at 4 PM. 100 tablet 1   No current facility-administered medications for this visit.      Physical Exam:   BP 131/86 (BP Location: Right Arm, Patient Position: Sitting, Cuff Size: Normal)   Pulse 68   Temp (!) 97.3 F (36.3 C)   Resp 20   Ht 5' 9.5" (1.765 m)  Wt 161 lb 2.5 oz (73.1 kg)   SpO2 98% Comment: RA  BMI 23.46 kg/m   General:  Well-appearing  Chest:   Clear to auscultation  CV:   Regular rate and rhythm without murmur  Incisions:  Completely healed  Abdomen:  Soft nontender  Extremities:  Warm and well-perfused  Diagnostic Tests:   ECHOCARDIOGRAM REPORT       Patient Name:  Jerry Foster Date of Exam: 09/14/2019  Medical Rec #: [id number]   Height:    69.0 in  Accession #:  [id number]  Weight:    156.0 lb  Date of Birth: [birth year]   BSA:     1.859 m  Patient Age:  59 years   BP:      132/80 mmHg    Patient Gender: M       HR:      60 bpm.  Exam Location: [facility]   Procedure: 2D Echo, 3D Echo, Cardiac Doppler and Color Doppler   Indications:  I35 Aortic stenosis    History:    Patient has prior history of Echocardiogram examinations,  most         recent 07/23/2019. CHF, Pacemaker, Arrythmias:Atrial         Fibrillation, RBBB and Complete heart block.,         Signs/Symptoms:Murmur; Risk Factors:Dyslipidemia.         Aortic Valve: 21 mm Edwards Inspiris Resilia valve is  present in         the aortic position. Procedure Date: 07/23/19.    Sonographer:  Jessee Avers, RDCS  Referring Phys: [place]    1. Normal LV function; mild LVH; grade 1 diastolic dysfunction; s/p AVR  with mean gradient of 9 mmHg and no AI.  2. Left ventricular ejection fraction, by estimation, is 50 to 55%. The  left ventricle has low normal function. The left ventricle has no regional  wall motion abnormalities. There is mild left ventricular hypertrophy.  Left ventricular diastolic  parameters are consistent with Grade I diastolic dysfunction (impaired  relaxation). Elevated left atrial pressure.  3. Right ventricular systolic function is normal. The right ventricular  size is normal. There is normal pulmonary artery systolic pressure.  4. The mitral valve is normal in structure. Trivial mitral valve  regurgitation. No evidence of mitral stenosis.  5. The aortic valve has been repaired/replaced. Aortic valve  regurgitation is not visualized. No aortic stenosis is present. There is a  21 mm Edwards Inspiris Resilia valve present in the aortic position.  Procedure Date: 07/23/19.  6. The inferior vena cava is normal in size with greater than 50%  respiratory variability, suggesting right atrial pressure of 3 mmHg.   Comparison(s): Intraop TEE 07/23/19 EF 60-65%.   FINDINGS  Left Ventricle: Left  ventricular ejection fraction, by estimation, is 50  to 55%. The left ventricle has low normal function. The left ventricle has  no regional wall motion abnormalities. The left ventricular internal  cavity size was normal in size.  There is mild left ventricular hypertrophy. Left ventricular diastolic  parameters are consistent with Grade I diastolic dysfunction (impaired  relaxation). Elevated left atrial pressure.   Right Ventricle: The right ventricular size is normal.Right ventricular  systolic function is normal. There is normal pulmonary artery systolic  pressure. The tricuspid regurgitant velocity is 2.18 m/s, and with an  assumed right atrial pressure of 3 mmHg,  the estimated right ventricular systolic pressure is 88.2 mmHg.  Left Atrium: Left atrial size was normal in size.   Right Atrium: Right atrial size was normal in size.   Pericardium: There is no evidence of pericardial effusion.   Mitral Valve: The mitral valve is normal in structure. Normal mobility of  the mitral valve leaflets. Trivial mitral valve regurgitation. No evidence  of mitral valve stenosis.   Tricuspid Valve: The tricuspid valve is normal in structure. Tricuspid  valve regurgitation is trivial. No evidence of tricuspid stenosis.   Aortic Valve: The aortic valve has been repaired/replaced. Aortic valve  regurgitation is not visualized. No aortic stenosis is present. Aortic  valve mean gradient measures 8.5 mmHg. Aortic valve peak gradient measures  15.1 mmHg. There is a 21 mm Edwards  Inspiris Resilia valve present in the aortic position. Procedure Date:  07/23/19.   Pulmonic Valve: The pulmonic valve was normal in structure. Pulmonic valve  regurgitation is trivial. No evidence of pulmonic stenosis.   Aorta: The aortic root is normal in size and structure.   Venous: The inferior vena cava is normal in size with greater than 50%  respiratory variability, suggesting right atrial pressure of 3  mmHg.   IAS/Shunts: No atrial level shunt detected by color flow Doppler.   Additional Comments: Normal LV function; mild LVH; grade 1 diastolic  dysfunction; s/p AVR with mean gradient of 9 mmHg and no AI. A pacer wire  is visualized.     LEFT VENTRICLE  PLAX 2D  LVIDd:     4.90 cm Diastology  LVIDs:     3.45 cm LV e' lateral:  7.75 cm/s  LV PW:     0.80 cm LV E/e' lateral: 9.3  LV IVS:    0.80 cm LV e' medial:  4.31 cm/s             LV E/e' medial: 16.7               3D Volume EF:             3D EF:    152 %             LV EDV:    67 ml             LV ESV:    56 ml             LV SV:    86 ml   RIGHT VENTRICLE  RV Basal diam: 3.20 cm  RV S prime:   9.12 cm/s  TAPSE (M-mode): 1.6 cm  RVSP:      22.0 mmHg   LEFT ATRIUM       Index    RIGHT ATRIUM      Index  LA diam:    3.80 cm 2.04 cm/m RA Pressure: 3.00 mmHg  LA Vol (A2C):  65.4 ml 35.18 ml/m RA Area:   14.10 cm  LA Vol (A4C):  43.7 ml 23.51 ml/m RA Volume:  35.80 ml 19.26 ml/m  LA Biplane Vol: 57.4 ml 30.88 ml/m  AORTIC VALVE  AV Vmax:      194.00 cm/s  AV Vmean:     142.000 cm/s  AV VTI:      0.437 m  AV Peak Grad:   15.1 mmHg  AV Mean Grad:   8.5 mmHg  LVOT Vmax:     110.00 cm/s  LVOT Vmean:    75.400 cm/s  LVOT VTI:     0.249 m  LVOT/AV VTI ratio: 0.57    AORTA  Ao Root diam: 3.10 cm  Ao Asc diam: 3.00 cm   MITRAL VALVE        TRICUSPID VALVE               TR Peak grad:  19.0 mmHg               TR Vmax:    218.00 cm/s  MV E velocity: 72.00 cm/s Estimated RAP: 3.00 mmHg  MV A velocity: 84.40 cm/s RVSP:      22.0 mmHg  MV E/A ratio: 0.85               SHUNTS               Systemic VTI: 0.25 m   Kirk Ruths MD  Electronically signed by Kirk Ruths MD  Signature Date/Time: 09/14/2019/1:38:06 PM      Impression:  Patient is doing very well approximately 3 months status post aortic valve replacement using bioprosthetic tissue valve.  Plan:  I have recommended that the patient may stop warfarin anticoagulation as previously suggested by Dr. Rayann Heman.  I have reminded the patient that he should remain on aspirin 81 mg a day.  We have otherwise not recommended any changes to his current medications.  The patient may resume unrestricted physical activity without any limitations.  The patient has been reminded regarding the importance of dental hygiene and the lifelong need for antibiotic prophylaxis for all dental cleanings and other related invasive procedures.  Patient will continue to follow-up intermittently with Dr. Rayann Heman and Dr. Burt Knack.  He will return to our office for routine follow-up next spring, approximately 1 year following his surgery.  He will call and return sooner should specific problems or questions arise.     Valentina Gu. Roxy Manns, MD 11/15/2019 11:41 AM

## 2019-11-15 NOTE — Progress Notes (Signed)
I have reviewed a Home Exercise Prescription with Jerry Foster is currently exercising at home. The patient was advised to continue to walk 2-3 days a week for 30-45 minutes.  Jerry Rinks and I discussed how to progress their exercise prescription. The patient stated that they understand the exercise prescription. We reviewed exercise guidelines, target heart rate during exercise, RPE Scale, weather conditions, NTG use, endpoints for exercise, warmup and cool down. Patient is encouraged to come to me with any questions. I will continue to follow up with the patient to assist them with progression and safety.     Carma Lair MS, CEP CSCS 2:06 PM 11/15/2019

## 2019-11-15 NOTE — Patient Instructions (Addendum)
You may stop taking warfarin (Coumadin).  Continue all other previous medications without any changes at this time  You may resume unrestricted physical activity without any particular limitations at this time.  Endocarditis is a potentially serious infection of heart valves or inside lining of the heart.  It occurs more commonly in patients with diseased heart valves (such as patient's with aortic or mitral valve disease) and in patients who have undergone heart valve repair or replacement.  Certain surgical and dental procedures may put you at risk, such as dental cleaning, other dental procedures, or any surgery involving the respiratory, urinary, gastrointestinal tract, gallbladder or prostate gland.   To minimize your chances for develooping endocarditis, maintain good oral health and seek prompt medical attention for any infections involving the mouth, teeth, gums, skin or urinary tract.    Always notify your doctor or dentist about your underlying heart valve condition before having any invasive procedures. You will need to take antibiotics before certain procedures, including all routine dental cleanings or other dental procedures.  Your cardiologist or dentist should prescribe these antibiotics for you to be taken ahead of time.

## 2019-11-17 ENCOUNTER — Other Ambulatory Visit: Payer: Self-pay

## 2019-11-17 ENCOUNTER — Encounter (HOSPITAL_COMMUNITY)
Admission: RE | Admit: 2019-11-17 | Discharge: 2019-11-17 | Disposition: A | Discharge status: Home / Self Care | Payer: Medicare Other | Source: Ambulatory Visit | Attending: Cardiovascular Disease | Admitting: Cardiovascular Disease

## 2019-11-17 ENCOUNTER — Encounter: Payer: Self-pay | Admitting: Cardiovascular Disease

## 2019-11-17 ENCOUNTER — Ambulatory Visit: Payer: Medicare Other | Admitting: Cardiovascular Disease

## 2019-11-17 ENCOUNTER — Encounter (HOSPITAL_COMMUNITY): Payer: Medicare Other

## 2019-11-17 VITALS — BP 110/64 | HR 78 | Ht 69.5 in | Wt 159.8 lb

## 2019-11-17 DIAGNOSIS — I350 Nonrheumatic aortic (valve) stenosis: Secondary | ICD-10-CM | POA: Diagnosis not present

## 2019-11-17 DIAGNOSIS — I442 Atrioventricular block, complete: Secondary | ICD-10-CM | POA: Diagnosis not present

## 2019-11-17 DIAGNOSIS — I480 Paroxysmal atrial fibrillation: Secondary | ICD-10-CM | POA: Diagnosis not present

## 2019-11-17 DIAGNOSIS — Z952 Presence of prosthetic heart valve: Secondary | ICD-10-CM

## 2019-11-17 NOTE — Progress Notes (Signed)
Cardiology Office Note:    Date:  11/17/2019   ID:  Jerry Foster, DOB March 14, 1951, MRN [id number]  PCP:  Jefm Petty, MD  [facility] Cardiologist:  Sherren Mocha, MD  [facility] Electrophysiologist:  [name], MD   Referring MD: Jefm Petty, MD   Chief Complaint  Patient presents with  . Aortic Stenosis    History of Present Illness:    Jerry Foster is a 69 y.o. male with a hx of aortic stenosis treated with minimally invasive aortic valve replacement July 23, 2019.  He was treated with a stented bioprosthetic tissue valve via right minithoracotomy approach, with surgery complicated by postoperative atrial fibrillation and complete heart block.  He was ultimately treated with a permanent pacemaker.  He was seen in follow-up by Dr. Roxy Manns earlier this week and warfarin was discontinued at that time.  Device interrogation demonstrated no recurrence of atrial fibrillation.  The patient is here alone today. He is doing very well. He is back to most of his normal activities, stating that he cut his grass last week. He has been traveling and recently returned from a trip to [place] in [place]. He denies chest pain, shortness of breath, heart palpitations, orthopnea, or PND. He does have some fatigue but feels that this is slowly improving.  Past Medical History:  Diagnosis Date  . Aortic stenosis   . Benign prostatic hyperplasia   . Bronchitis   . Cataract    bilateral   . CHF (congestive heart failure) ([place])   . Colon polyps   . Complete heart block ([place]) 07/28/2019  . Heart murmur   . History of fracture of vertebral column   . Hyperlipidemia   . Kidney stone   . Nonrheumatic aortic valve stenosis   . Osteoporosis   . Osteoporosis   . Pure hypercholesterolemia   . RBBB   . 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   21 mm Edwards Inspiris Resilia stented bovine pericardial tissue valve via right mini thoracotomy  approach  . Sensorineural hearing loss of both ears   . Thrombosed hemorrhoids    hsitory of  . Tubular adenoma of colon   . Vitamin D deficiency     Past Surgical History:  Procedure Laterality Date  . AORTIC VALVE REPLACEMENT N/A 07/23/2019   Procedure: MINIMALLY INVASIVE AORTIC VALVE REPLACEMENT (AVR) using Margaretha Sheffield Resilia 21 MM Aortic Valve.;  Surgeon: Rexene Alberts, MD;  Location: [facility];  Service: Open Heart Surgery;  Laterality: N/A;  . CARDIAC CATHETERIZATION    . COLON SURGERY  2006   1 foot ascending colon removed   . COLONOSCOPY    . HERNIA REPAIR    . PACEMAKER IMPLANT N/A 07/29/2019   Procedure: PACEMAKER IMPLANT;  Surgeon: [name], MD;  Location: [employer] [facility] [facility];  Service: Cardiovascular;  Laterality: N/A;  . POLYPECTOMY    . RIGHT HEART CATH AND CORONARY ANGIOGRAPHY N/A 06/08/2019   Procedure: RIGHT HEART CATH AND CORONARY ANGIOGRAPHY;  Surgeon: Sherren Mocha, MD;  Location: [place] [place] [facility];  Service: Cardiovascular;  Laterality: N/A;  . TEE WITHOUT CARDIOVERSION N/A 07/23/2019   Procedure: TRANSESOPHAGEAL ECHOCARDIOGRAM (TEE);  Surgeon: Rexene Alberts, MD;  Location: [facility];  Service: Open Heart Surgery;  Laterality: N/A;  . TESTICLE SURGERY      Current Medications: Current Meds  Medication Sig  . acetaminophen (TYLENOL) 500 MG tablet Take 500 mg by mouth every 8 (eight) hours as needed for moderate pain  or headache.   [name] aspirin 81 MG EC tablet Take 1 tablet (81 mg total) by mouth daily.  . Calcium Citrate-Vitamin D (CITRACAL + D PO) Take 2 tablets by mouth in the morning and at bedtime.  . Cholecalciferol (DIALYVITE VITAMIN D 5000) 125 MCG (5000 UT) capsule Take 5,000 Units by mouth daily.  [name] ibuprofen (ADVIL) 200 MG tablet Take 400 mg by mouth every 6 (six) hours as needed for moderate pain.   . metoprolol tartrate (LOPRESSOR) 25 MG tablet Take 1 tablet (25 mg total) by mouth 2 (two) times daily.  . Omega-3 1400 MG CAPS Take 1,400 mg by  mouth daily.   . pravastatin (PRAVACHOL) 40 MG tablet Take 40 mg by mouth.     Allergies:   Terbinafine hcl   Social History   Socioeconomic History  . Marital status: Single    Spouse name: Not on file  . Number of children: Not on file  . Years of education: Not on file  . Highest education level: Professional school degree (e.g., MD, DDS, DVM, JD)  Occupational History  . Occupation: Retired Pharmacist, community  Tobacco Use  . Smoking status: Never Smoker  . Smokeless tobacco: Never Used  Vaping Use  . Vaping Use: Never used  Substance and Sexual Activity  . Alcohol use: Yes    Comment: Moderately, 2 bottles wine per week  . Drug use: No  . Sexual activity: Not on file  Other Topics Concern  . Not on file  Social History Narrative  . Not on file   Social Determinants of Health   Financial Resource Strain:   . Difficulty of Paying Living Expenses: Not on file  Food Insecurity:   . Worried About Charity fundraiser in the Last Year: Not on file  . Ran Out of Food in the Last Year: Not on file  Transportation Needs:   . Lack of Transportation (Medical): Not on file  . Lack of Transportation (Non-Medical): Not on file  Physical Activity:   . Days of Exercise per Week: Not on file  . Minutes of Exercise per Session: Not on file  Stress:   . Feeling of Stress : Not on file  Social Connections:   . Frequency of Communication with Friends and Family: Not on file  . Frequency of Social Gatherings with Friends and Family: Not on file  . Attends Religious Services: Not on file  . Active Member of Clubs or Organizations: Not on file  . Attends Archivist Meetings: Not on file  . Marital Status: Not on file     Family History: The patient's family history is negative for Colon cancer, Colon polyps, Esophageal cancer, Rectal cancer, and Stomach cancer.  ROS:   Please see the history of present illness.    All other systems reviewed and are negative.  EKGs/Labs/Other  Studies Reviewed:    The following studies were reviewed today: Echo 09/14/2019: IMPRESSIONS    1. Normal LV function; mild LVH; grade 1 diastolic dysfunction; s/p AVR  with mean gradient of 9 mmHg and no AI.  2. Left ventricular ejection fraction, by estimation, is 50 to 55%. The  left ventricle has low normal function. The left ventricle has no regional  wall motion abnormalities. There is mild left ventricular hypertrophy.  Left ventricular diastolic  parameters are consistent with Grade I diastolic dysfunction (impaired  relaxation). Elevated left atrial pressure.  3. Right ventricular systolic function is normal. The right ventricular  size is  normal. There is normal pulmonary artery systolic pressure.  4. The mitral valve is normal in structure. Trivial mitral valve  regurgitation. No evidence of mitral stenosis.  5. The aortic valve has been repaired/replaced. Aortic valve  regurgitation is not visualized. No aortic stenosis is present. There is a  21 mm Edwards Inspiris Resilia valve present in the aortic position.  Procedure Date: 07/23/19.  6. The inferior vena cava is normal in size with greater than 50%  respiratory variability, suggesting right atrial pressure of 3 mmHg.   Comparison(s): Intraop TEE 07/23/19 EF 60-65%.   EKG:  EKG is not ordered today.   Recent Labs: 07/31/2019: Magnesium 1.9 09/07/2019: ALT 27; BUN 18; Creatinine, Ser 0.81; Hemoglobin 14.8; Platelets 163; Potassium 4.7; Sodium 137; TSH 1.250  Recent Lipid Panel No results found for: CHOL, TRIG, HDL, CHOLHDL, VLDL, LDLCALC, LDLDIRECT  Physical Exam:    VS:  BP 110/64   Pulse 78   Ht 5' 9.5" (1.765 m)   Wt 159 lb 12.8 oz (72.5 kg)   SpO2 97%   BMI 23.26 kg/m     Wt Readings from Last 3 Encounters:  11/17/19 159 lb 12.8 oz (72.5 kg)  11/15/19 161 lb 2.5 oz (73.1 kg)  11/08/19 162 lb 4.8 oz (73.6 kg)     GEN:  Well nourished, well developed in no acute distress HEENT: Normal NECK: No  JVD; No carotid bruits LYMPHATICS: No lymphadenopathy CARDIAC: RRR, 2/6 early peaking systolic ejection murmur best heard at the right upper sternal border RESPIRATORY:  Clear to auscultation without rales, wheezing or rhonchi  ABDOMEN: Soft, non-tender, non-distended MUSCULOSKELETAL:  No edema; No deformity  SKIN: Warm and dry NEUROLOGIC:  Alert and oriented x 3 PSYCHIATRIC:  Normal affect   ASSESSMENT:    1. Paroxysmal atrial fibrillation (HCC)   2. Complete heart block ([place])   3. Nonrheumatic aortic valve stenosis    PLAN:    In order of problems listed above:  1. Appears limited to the postoperative period. Now off of amiodarone and off of anticoagulation. He is back on aspirin 81 mg daily. We will continue metoprolol at a dose of 25 mg twice daily. 2. Status post permanent pacemaker placement, does not appear to be pacemaker dependent at this time. Follows with Dr. Rayann Heman. 3. Postoperative echo reviewed as above. Normal function of his surgical bioprosthesis. Normal valve gradients noted. SBE prophylaxis has been reviewed with the patient. Recommend follow-up in 1 year.   Medication Adjustments/Labs and Tests Ordered: Current medicines are reviewed at length with the patient today.  Concerns regarding medicines are outlined above.  No orders of the defined types were placed in this encounter.  No orders of the defined types were placed in this encounter.   Patient Instructions  Medication Instructions:  Your provider recommends that you continue on your current medications as directed. Please refer to the Current Medication list given to you today.   *If you need a refill on your cardiac medications before your next appointment, please call your pharmacy*  Follow-Up: At [facility], you and your health needs are our priority.  As part of our continuing mission to provide you with exceptional heart care, we have created designated Provider Care Teams.  These Care Teams  include your primary Cardiologist (physician) and Advanced Practice Providers (APPs -  Physician Assistants and Nurse Practitioners) who all work together to provide you with the care you need, when you need it. Your next appointment:   12 month(s) The  format for your next appointment:   In Person Provider:   You may see Sherren Mocha, MD or one of the following Advanced Practice Providers on your designated Care Team:    [name], PA-C  Robbie Lis, [place]      Signed, Sherren Mocha, MD  11/17/2019 2:27 PM    [place]

## 2019-11-17 NOTE — Patient Instructions (Signed)

## 2019-11-19 ENCOUNTER — Other Ambulatory Visit: Payer: Self-pay

## 2019-11-19 ENCOUNTER — Encounter (HOSPITAL_COMMUNITY)
Admission: RE | Admit: 2019-11-19 | Discharge: 2019-11-19 | Disposition: A | Discharge status: Home / Self Care | Payer: Medicare Other | Source: Ambulatory Visit | Attending: Cardiovascular Disease | Admitting: Cardiovascular Disease

## 2019-11-19 DIAGNOSIS — Z952 Presence of prosthetic heart valve: Secondary | ICD-10-CM | POA: Diagnosis not present

## 2019-11-22 ENCOUNTER — Encounter (HOSPITAL_COMMUNITY)
Admission: RE | Admit: 2019-11-22 | Discharge: 2019-11-22 | Disposition: A | Discharge status: Home / Self Care | Payer: Medicare Other | Source: Ambulatory Visit | Attending: Cardiovascular Disease | Admitting: Cardiovascular Disease

## 2019-11-22 ENCOUNTER — Other Ambulatory Visit: Payer: Self-pay

## 2019-11-22 DIAGNOSIS — Z952 Presence of prosthetic heart valve: Secondary | ICD-10-CM

## 2019-11-24 ENCOUNTER — Encounter (HOSPITAL_COMMUNITY): Payer: Medicare Other

## 2019-11-26 ENCOUNTER — Other Ambulatory Visit: Payer: Self-pay

## 2019-11-26 ENCOUNTER — Encounter (HOSPITAL_COMMUNITY)
Admission: RE | Admit: 2019-11-26 | Discharge: 2019-11-26 | Disposition: A | Discharge status: Home / Self Care | Payer: Medicare Other | Source: Ambulatory Visit | Attending: Cardiovascular Disease | Admitting: Cardiovascular Disease

## 2019-11-26 DIAGNOSIS — Z952 Presence of prosthetic heart valve: Secondary | ICD-10-CM | POA: Insufficient documentation

## 2019-12-01 ENCOUNTER — Encounter (HOSPITAL_COMMUNITY): Payer: Medicare Other

## 2019-12-01 ENCOUNTER — Telehealth: Payer: Self-pay | Admitting: Cardiovascular Disease

## 2019-12-01 NOTE — Progress Notes (Signed)
Cardiac Individual Treatment Plan  Patient Details  Name: Jerry Foster MRN: [id number] Date of Birth: [birth year] Referring Provider:     [facility] from 10/19/2019 in [place]  Referring Provider Sherren Mocha, MD      Initial Encounter Date:    CARDIAC REHAB PHASE II ORIENTATION from 10/19/2019 in [place]  Date 10/19/19      Visit Diagnosis: 07/23/19 AVR  Patient's Home Medications on Admission:  Current Outpatient Medications:  .  acetaminophen (TYLENOL) 500 MG tablet, Take 500 mg by mouth every 8 (eight) hours as needed for moderate pain or headache. , Disp: , Rfl:  .  aspirin 81 MG EC tablet, Take 1 tablet (81 mg total) by mouth daily., Disp: 30 tablet, Rfl:  .  Calcium Citrate-Vitamin D (CITRACAL + D PO), Take 2 tablets by mouth in the morning and at bedtime., Disp: , Rfl:  .  Cholecalciferol (DIALYVITE VITAMIN D 5000) 125 MCG (5000 UT) capsule, Take 5,000 Units by mouth daily., Disp: , Rfl:  .  ibuprofen (ADVIL) 200 MG tablet, Take 400 mg by mouth every 6 (six) hours as needed for moderate pain. , Disp: , Rfl:  .  metoprolol tartrate (LOPRESSOR) 25 MG tablet, Take 1 tablet (25 mg total) by mouth 2 (two) times daily., Disp: 180 tablet, Rfl: 3 .  Omega-3 1400 MG CAPS, Take 1,400 mg by mouth daily. , Disp: , Rfl:  .  pravastatin (PRAVACHOL) 40 MG tablet, Take 40 mg by mouth., Disp: , Rfl:   Past Medical History: Past Medical History:  Diagnosis Date  . Aortic stenosis   . Benign prostatic hyperplasia   . Bronchitis   . Cataract    bilateral   . CHF (congestive heart failure) ([place])   . Colon polyps   . Complete heart block ([place]) 07/28/2019  . Heart murmur   . History of fracture of vertebral column   . Hyperlipidemia   . Kidney stone   . Nonrheumatic aortic valve stenosis   . Osteoporosis   . Osteoporosis   . Pure hypercholesterolemia   . RBBB   . 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 Sensorineural hearing loss of both ears   . Thrombosed hemorrhoids    hsitory of  . Tubular adenoma of colon   . Vitamin D deficiency     Tobacco Use: Social History   Tobacco Use  Smoking Status Never Smoker  Smokeless Tobacco Never Used    Labs: Recent Review Flowsheet Data    Labs for ITP Cardiac and Pulmonary Rehab Latest Ref Rng & Units 07/23/2019 07/23/2019 07/23/2019 07/23/2019 07/24/2019   Hemoglobin A1c 4.8 - 5.6 % - - - - -   PHART 7.35 - 7.45 7.128(LL) 7.353 7.405 7.261(L) 7.364   PCO2ART 32 - 48 mmHg 76.1(HH) 36.6 36.5 50.9(H) 38.6   HCO3 20.0 - 28.0 mmol/L 25.2 20.4 22.9 22.8 22.0   TCO2 22 - 32 mmol/L 28 22 24 24 23    ACIDBASEDEF 0.0 - 2.0 mmol/L 6.0(H) 5.0(H) 2.0 5.0(H) 3.0(H)   O2SAT % 94.0 99.0 99.0 98.0 98.0      Capillary Blood Glucose: Lab Results  Component Value Date   GLUCAP 123 (H) 07/28/2019   GLUCAP 122 (H) 07/28/2019   GLUCAP 121 (H) 07/27/2019   GLUCAP 128 (H) 07/27/2019  GLUCAP 94 07/27/2019     Exercise Target Goals: Exercise Program Goal: Individual exercise prescription set using results from initial 6 min walk test and THRR while considering  patient's activity barriers and safety.   Exercise Prescription Goal: Starting with aerobic activity 30 plus minutes a day, 3 days per week for initial exercise prescription. Provide home exercise prescription and guidelines that participant acknowledges understanding prior to discharge.  Activity Barriers & Risk Stratification:  Activity Barriers & Cardiac Risk Stratification - 10/19/19 0904      Activity Barriers & Cardiac Risk Stratification   Activity Barriers None    Cardiac Risk Stratification High           6 Minute Walk:  6 Minute Walk    Row Name 10/19/19 0913         6 Minute Walk   Phase Initial     Distance  2335 feet     Walk Time 6 minutes     # of Rest Breaks 0     MPH 4.42     METS 4.98     RPE 13     Perceived Dyspnea  0     VO2 Peak 17.42     Symptoms No     Resting HR 60 bpm     Resting BP 124/70     Resting Oxygen Saturation  97 %     Exercise Oxygen Saturation  during 6 min walk 98 %     Max Ex. HR 84 bpm     Max Ex. BP 158/82     2 Minute Post BP 122/80            Oxygen Initial Assessment:   Oxygen Re-Evaluation:   Oxygen Discharge (Final Oxygen Re-Evaluation):   Initial Exercise Prescription:  Initial Exercise Prescription - 10/19/19 1100      Date of Initial Exercise RX and Referring Provider   Date 10/19/19    Referring Provider Sherren Mocha, MD    Expected Discharge Date 12/17/19      Treadmill   MPH 3.2    Grade 1    Minutes 15    METs 3.89      NuStep   Level 4    SPM 85    Minutes 15    METs 3      Prescription Details   Frequency (times per week) 3    Duration Progress to 30 minutes of continuous aerobic without signs/symptoms of physical distress      Intensity   THRR 40-80% of Max Heartrate 60-120    Ratings of Perceived Exertion 11-13    Perceived Dyspnea 0-4      Progression   Progression Continue to progress workloads to maintain intensity without signs/symptoms of physical distress.      Resistance Training   Training Prescription Yes    Weight 4lbs    Reps 10-15           Perform Capillary Blood Glucose checks as needed.  Exercise Prescription Changes:   Exercise Prescription Changes    Row Name 10/25/19 1200 11/03/19 1233 11/15/19 1300 11/24/19 1459       Response to Exercise   Blood Pressure (Admit) 102/78 126/76 140/70 118/82    Blood Pressure (Exercise) 154/84 130/80 120/80 128/80    Blood Pressure (Exit) 122/78 122/72 130/80 124/70    Heart Rate (Admit) 60 bpm 60 bpm 70 bpm 70 bpm    Heart Rate (Exercise) 75 bpm 89 bpm 97 bpm  95 bpm    Heart Rate (Exit) 60 bpm 69 bpm 74 bpm 72 bpm    Rating of  Perceived Exertion (Exercise) 11 11 13 13     Perceived Dyspnea (Exercise) -- 0 0 0    Symptoms None None None None    Comments Pt's first day of exercise in [facility], pt tolerated session well. Pt very eager to increase workload, will work with pt to progress safely and gradually Reviewed Home Exercise Plan None    Duration Progress to 30 minutes of  aerobic without signs/symptoms of physical distress Continue with 30 min of aerobic exercise without signs/symptoms of physical distress. Continue with 30 min of aerobic exercise without signs/symptoms of physical distress. Continue with 30 min of aerobic exercise without signs/symptoms of physical distress.    Intensity THRR unchanged THRR unchanged THRR unchanged THRR unchanged      Progression   Progression Continue to progress workloads to maintain intensity without signs/symptoms of physical distress. Continue to progress workloads to maintain intensity without signs/symptoms of physical distress. Continue to progress workloads to maintain intensity without signs/symptoms of physical distress. Continue to progress workloads to maintain intensity without signs/symptoms of physical distress.    Average METs 3.6 4.7 4.5 4.7      Resistance Training   Training Prescription Yes No Yes Yes    Weight 4 lbs -- 5lbs 5lbs    Reps 10-15 -- 10-15 10-15    Time -- -- 10 Minutes 10 Minutes      Interval Training   Interval Training No No No No      Treadmill   MPH 3.3 3.3 3.3 3.3    Grade 1 5 5.5 5.5    Minutes 15 15 15 15     METs 3.98 5.8 6.03 6.2      NuStep   Level 4 8 5 5     SPM 85 95 95 110    Minutes 15 15 15 15     METs 3.2 3.6 3 4.5      Home Exercise Plan   Plans to continue exercise at -- -- Home (comment)  Walking Home (comment)  Walking    Frequency -- -- Add 2 additional days to program exercise sessions. Add 2 additional days to program exercise sessions.    Initial Home Exercises Provided -- -- 11/15/19 11/15/19            Exercise Comments:   Exercise Comments    Row Name 10/25/19 1257 11/04/19 1234 11/15/19 1356 12/02/19 1324     Exercise Comments Pt's first day of exercise. Pt tolerated exercise session well. Pt is tolerating exercise well. Pt is very eager to increase workloads, too quickly. Working with pt to learn how to safely progress workloads and gradually. Will continue to monitor. Reviewed Home Exercise Plan with pt. Pt is currently walking at home 2-3 days a week. Pt verbalized understanding of home exercise guidelines. Will continue to progress pt as tolerated. Reviewed METs and goals with pt. Pt is continuing to respond well to exercise prescription. Continuing to work with pt to understand how to safely increase workloads. Pt prefers to increase workloads on treadmill each visit instead of gradually increasing workloads. Will continue to monitor.           Exercise Goals and Review:   Exercise Goals    Row Name 10/19/19 0904             Exercise Goals   Increase Physical Activity Yes  Intervention Provide advice, education, support and counseling about physical activity/exercise needs.;Develop an individualized exercise prescription for aerobic and resistive training based on initial evaluation findings, risk stratification, comorbidities and participant's personal goals.       Expected Outcomes Short Term: Attend rehab on a regular basis to increase amount of physical activity.;Long Term: Exercising regularly at least 3-5 days a week.;Long Term: Add in home exercise to make exercise part of routine and to increase amount of physical activity.       Increase Strength and Stamina Yes       Intervention Provide advice, education, support and counseling about physical activity/exercise needs.;Develop an individualized exercise prescription for aerobic and resistive training based on initial evaluation findings, risk stratification, comorbidities and participant's personal goals.        Expected Outcomes Short Term: Increase workloads from initial exercise prescription for resistance, speed, and METs.;Short Term: Perform resistance training exercises routinely during rehab and add in resistance training at home;Long Term: Improve cardiorespiratory fitness, muscular endurance and strength as measured by increased METs and functional capacity (6MWT)       Able to understand and use rate of perceived exertion (RPE) scale Yes       Intervention Provide education and explanation on how to use RPE scale       Expected Outcomes Short Term: Able to use RPE daily in rehab to express subjective intensity level;Long Term:  Able to use RPE to guide intensity level when exercising independently       Knowledge and understanding of Target Heart Rate Range (THRR) Yes       Intervention Provide education and explanation of THRR including how the numbers were predicted and where they are located for reference       Expected Outcomes Short Term: Able to state/look up THRR;Long Term: Able to use THRR to govern intensity when exercising independently;Short Term: Able to use daily as guideline for intensity in rehab       Able to check pulse independently Yes       Intervention Provide education and demonstration on how to check pulse in carotid and radial arteries.;Review the importance of being able to check your own pulse for safety during independent exercise       Expected Outcomes Short Term: Able to explain why pulse checking is important during independent exercise;Long Term: Able to check pulse independently and accurately       Understanding of Exercise Prescription Yes       Intervention Provide education, explanation, and written materials on patient's individual exercise prescription       Expected Outcomes Short Term: Able to explain program exercise prescription;Long Term: Able to explain home exercise prescription to exercise independently              Exercise Goals Re-Evaluation :   Exercise Goals Re-Evaluation    Row Name 10/25/19 1255 11/15/19 1358 12/02/19 1328         Exercise Goal Re-Evaluation   Exercise Goals Review Increase Physical Activity;Increase Strength and Stamina;Able to understand and use rate of perceived exertion (RPE) scale;Knowledge and understanding of Target Heart Rate Range (THRR);Able to check pulse independently;Understanding of Exercise Prescription Increase Physical Activity;Increase Strength and Stamina;Able to understand and use rate of perceived exertion (RPE) scale;Knowledge and understanding of Target Heart Rate Range (THRR);Able to check pulse independently;Understanding of Exercise Prescription Increase Physical Activity;Increase Strength and Stamina;Able to understand and use rate of perceived exertion (RPE) scale;Knowledge and understanding of Target Heart Rate Range (THRR);Able  to check pulse independently;Understanding of Exercise Prescription     Comments Pt's first day in [facility]2 program. Pt tolerated the exercise bout well and understands the exercise Rx, RPE scale and THRR. Reviewed Home Exercise Program with pt. Also discussed THRR, RPE Scale, weather precautions, endpoints of exercise, NTG use, warmup and cool down. Reviewed METs and goals with pt. Pt is enjoying program and states he can tell it is helping.     Expected Outcomes Will continue to monitor patient and progress as tolerated. Pt will continue to exercise most days of the week, 30-45 minutes. Pt will continue to increase cardiovascular fitness.             Discharge Exercise Prescription (Final Exercise Prescription Changes):  Exercise Prescription Changes - 11/24/19 1459      Response to Exercise   Blood Pressure (Admit) 118/82    Blood Pressure (Exercise) 128/80    Blood Pressure (Exit) 124/70    Heart Rate (Admit) 70 bpm    Heart Rate (Exercise) 95 bpm    Heart Rate (Exit) 72 bpm    Rating of Perceived Exertion (Exercise) 13    Perceived Dyspnea (Exercise) 0     Symptoms None    Comments None    Duration Continue with 30 min of aerobic exercise without signs/symptoms of physical distress.    Intensity THRR unchanged      Progression   Progression Continue to progress workloads to maintain intensity without signs/symptoms of physical distress.    Average METs 4.7      Resistance Training   Training Prescription Yes    Weight 5lbs    Reps 10-15    Time 10 Minutes      Interval Training   Interval Training No      Treadmill   MPH 3.3    Grade 5.5    Minutes 15    METs 6.2      NuStep   Level 5    SPM 110    Minutes 15    METs 4.5      Home Exercise Plan   Plans to continue exercise at Home (comment)   Walking   Frequency Add 2 additional days to program exercise sessions.    Initial Home Exercises Provided 11/15/19           Nutrition:  Target Goals: Understanding of nutrition guidelines, daily intake of sodium 1500mg , cholesterol 200mg , calories 30% from fat and 7% or less from saturated fats, daily to have 5 or more servings of fruits and vegetables.  Biometrics:  Pre Biometrics - 10/19/19 0909      Pre Biometrics   Height 5\' 9"  (1.753 m)    Waist Circumference 32.5 inches    Hip Circumference 38.25 inches    Waist to Hip Ratio 0.85 %    BMI (Calculated) 23.79    Triceps Skinfold 9 mm    % Body Fat 20.5 %    Grip Strength 40.5 kg    Flexibility 13.13 in    Single Leg Stand 30 seconds            Nutrition Therapy Plan and Nutrition Goals:  Nutrition Therapy & Goals - 10/28/19 0707      Nutrition Therapy   Diet Heart Healthy    Drug/Food Interactions Coumadin/Vit K      Personal Nutrition Goals   Nutrition Goal Pt to avoid dark leafy greens since he does not eat them daily    Personal Goal #2 Pt  to choose 1 cup veggies with moderate vitamin k daily to have consistent intake of vitamin k daily      Intervention Plan   Intervention Prescribe, educate and counsel regarding individualized specific dietary  modifications aiming towards targeted core components such as weight, hypertension, lipid management, diabetes, heart failure and other comorbidities.;Nutrition handout(s) given to patient.    Expected Outcomes Short Term Goal: A plan has been developed with personal nutrition goals set during dietitian appointment.           Nutrition Assessments:  Nutrition Assessments - 10/28/19 0711      MEDFICTS Scores   Pre Score 48           Nutrition Goals Re-Evaluation:  Nutrition Goals Re-Evaluation    Row Name 10/28/19 0711 11/30/19 0953           Goals   Current Weight 161 lb (73 kg) 165 lb 2 oz (74.9 kg)      Nutrition Goal Pt to avoid dark leafy greens since he does not eat them daily --        Personal Goal #2 Re-Evaluation   Personal Goal #2 Pt to choose 1 cup veggies with moderate vitamin k daily to have consistent intake of vitamin k daily --             Nutrition Goals Discharge (Final Nutrition Goals Re-Evaluation):  Nutrition Goals Re-Evaluation - 11/30/19 0953      Goals   Current Weight 165 lb 2 oz (74.9 kg)           Psychosocial: Target Goals: Acknowledge presence or absence of significant depression and/or stress, maximize coping skills, provide positive support system. Participant is able to verbalize types and ability to use techniques and skills needed for reducing stress and depression.  Initial Review & Psychosocial Screening:  Initial Psych Review & Screening - 10/19/19 1209      Initial Review   Current issues with None Identified      Family Dynamics   Good Support System? Yes   Jerry Foster lives alone but has friends nearby for support. Jerry Foster's twin brother lives in [place]     Barriers   Psychosocial barriers to participate in program There are no identifiable barriers or psychosocial needs.      Screening Interventions   Interventions Encouraged to exercise           Quality of Life Scores:  Quality of Life - 10/19/19 1116      Quality of  Life   Select Quality of Life      Quality of Life Scores   Health/Function Pre 26.89 %    Socioeconomic Pre 29.17 %    Psych/Spiritual Pre 21.7 %    Family Pre 30 %    GLOBAL Pre 26.67 %          Scores of 19 and below usually indicate a poorer quality of life in these areas.  A difference of  2-3 points is a clinically meaningful difference.  A difference of 2-3 points in the total score of the Quality of Life Index has been associated with significant improvement in overall quality of life, self-image, physical symptoms, and general health in studies assessing change in quality of life.  PHQ-9: Recent Review Flowsheet Data    Depression screen [facility] 2/9 10/19/2019   Decreased Interest 0   Down, Depressed, Hopeless 0   PHQ - 2 Score 0     Interpretation of Total Score  Total Score  Depression Severity:  1-4 = Minimal depression, 5-9 = Mild depression, 10-14 = Moderate depression, 15-19 = Moderately severe depression, 20-27 = Severe depression   Psychosocial Evaluation and Intervention:   Psychosocial Re-Evaluation:  Psychosocial Re-Evaluation    [place] Name 11/03/19 0932 11/30/19 1550           Psychosocial Re-Evaluation   Current issues with None Identified None Identified      Interventions Encouraged to attend Cardiac Rehabilitation for the exercise Encouraged to attend Cardiac Rehabilitation for the exercise      Continue Psychosocial Services  No Follow up required No Follow up required             Psychosocial Discharge (Final Psychosocial Re-Evaluation):  Psychosocial Re-Evaluation - 11/30/19 1550      Psychosocial Re-Evaluation   Current issues with None Identified    Interventions Encouraged to attend Cardiac Rehabilitation for the exercise    Continue Psychosocial Services  No Follow up required           Vocational Rehabilitation: Provide vocational rehab assistance to qualifying candidates.   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 Participant will state understanding/return demonstration of topics presented.  Learning Barriers/Preferences:  Learning Barriers/Preferences - 10/19/19 1211      Learning Barriers/Preferences   Learning Barriers None    Learning Preferences Audio;Individual Instruction;Skilled Demonstration;Verbal Instruction           Education Topics: Hypertension, Hypertension Reduction -Define heart disease and high blood pressure. Discus how high blood pressure affects the body and ways to reduce high blood pressure.   Exercise and Your Heart -Discuss why it is important to exercise, the FITT principles of exercise, normal and abnormal responses to exercise, and how to exercise safely.   Angina -Discuss definition of angina, causes of angina, treatment of angina, and how to decrease risk of having angina.   Cardiac Medications -Review what the following cardiac medications are used for, how they affect the body, and side effects that may occur when taking the medications.  Medications include Aspirin, Beta blockers, calcium channel blockers, ACE Inhibitors, angiotensin receptor blockers, diuretics, digoxin, and antihyperlipidemics.   Congestive Heart Failure -Discuss the definition of CHF, how to live with CHF, the signs and symptoms of CHF, and how keep track of weight and sodium intake.   Heart Disease and Intimacy -Discus the effect sexual activity has on the heart, how changes occur during intimacy as we age, and safety during sexual activity.   Smoking Cessation / COPD -Discuss different methods to quit smoking, the health benefits of quitting smoking,  and the definition of COPD.   Nutrition I: Fats -Discuss the types of cholesterol, what cholesterol does to the heart, and how cholesterol levels can be controlled.   Nutrition II: Labels -Discuss the different components of food labels and how to read food label   Heart Parts/Heart Disease and PAD -Discuss the anatomy of the heart, the pathway of blood circulation through the heart, and these are affected by heart disease.   Stress I: Signs and Symptoms -Discuss the causes of stress, how stress may lead to anxiety and  depression, and ways to limit stress.   Stress II: Relaxation -Discuss different types of relaxation techniques to limit stress.   Warning Signs of Stroke / TIA -Discuss definition of a stroke, what the signs and symptoms are of a stroke, and how to identify when someone is having stroke.   Knowledge Questionnaire Score:  Knowledge Questionnaire Score - 10/19/19 1116      Knowledge Questionnaire Score   Pre Score 21/24           Core Components/Risk Factors/Patient Goals at Admission:  Personal Goals and Risk Factors at Admission - 10/19/19 0904      Core Components/Risk Factors/Patient Goals on Admission    Weight Management Yes;Weight Loss    Intervention Weight Management: Provide education and appropriate resources to help participant work on and attain dietary goals.;Weight Management: Develop a combined nutrition and exercise program designed to reach desired caloric intake, while maintaining appropriate intake of nutrient and fiber, sodium and fats, and appropriate energy expenditure required for the weight goal.    Admit Weight 161 lb 2.5 oz (73.1 kg)    Goal Weight: Short Term 155 lb (70.3 kg)    Goal Weight: Long Term 151 lb (68.5 kg)    Expected Outcomes Short Term: Continue to assess and modify interventions until short term weight is achieved;Long Term: Adherence to nutrition and physical activity/exercise program aimed toward attainment of  established weight goal;Weight Loss: Understanding of general recommendations for a balanced deficit meal plan, which promotes 1-2 lb weight loss per week and includes a negative energy balance of [phone] kcal/d;Understanding recommendations for meals to include 15-35% energy as protein, 25-35% energy from fat, 35-60% energy from carbohydrates, less than 200mg  of dietary cholesterol, 20-35 gm of total fiber daily;Understanding of distribution of calorie intake throughout the day with the consumption of 4-5 meals/snacks    Lipids Yes    Intervention Provide education and support for participant on nutrition & aerobic/resistive exercise along with prescribed medications to achieve LDL 70mg , HDL >40mg .    Expected Outcomes Short Term: Participant states understanding of desired cholesterol values and is compliant with medications prescribed. Participant is following exercise prescription and nutrition guidelines.;Long Term: Cholesterol controlled with medications as prescribed, with individualized exercise RX and with personalized nutrition plan. Value goals: LDL < 70mg , HDL > 40 mg.    Personal Goal Other Yes    Personal Goal Improve flexibility and balance. Return to jogging.    Intervention Provide advice, education, support and counseling about physical activity/exercise needs.; Develop an individualized exercise prescription for aerobic and resistive training based on initial evaluation findings, risk stratification, comorbidities and participant's personal goals.    Expected Outcomes Perform stretching exercises routinely during rehab and at home. Improve flexibility and balance as measured by sit and reach test and single leg stand. Improve cardiorespiratory fitness as measured by METs and functional capacity (6MWT). receive MD clearance to return to jogging.           Core Components/Risk Factors/Patient Goals Review:   Goals and Risk Factor Review    Row Name 11/03/19 0933 11/30/19 1550             Core Components/Risk Factors/Patient Goals Review   Personal Goals Review Weight Management/Obesity;Lipids;Other Weight Management/Obesity;Lipids;Other      Review Jerry Foster is off to a good start to exercise. Jerry Foster's vital signs have been stable. Jerry Foster has been anxious to increase his work loads. Patient has been advised to increase his work loads slowly. Jerry Foster has been doing  well with exercise. Patient likes to move up his work loads. Jerry Foster's vital signs have been stable.      Expected Outcomes Jerry Foster will continue to participate in phase 2 cardiac rehab for exercise, nutrition and lifestyle modification Jerry Foster will continue to participate in phase 2 cardiac rehab for exercise, nutrition and lifestyle modification             Core Components/Risk Factors/Patient Goals at Discharge (Final Review):   Goals and Risk Factor Review - 11/30/19 1550      Core Components/Risk Factors/Patient Goals Review   Personal Goals Review Weight Management/Obesity;Lipids;Other    Review Jerry Foster has been doing well with exercise. Patient likes to move up his work loads. Jerry Foster's vital signs have been stable.    Expected Outcomes Jerry Foster will continue to participate in phase 2 cardiac rehab for exercise, nutrition and lifestyle modification           ITP Comments:  ITP Comments    Row Name 10/19/19 1103 11/03/19 0931 11/30/19 1548       ITP Comments Medical Director- Dr. Fransico Him, MD 30 Day ITP. Jerry Foster has good atttendance and participation in phase 2 cardiac rehab. Jerry Foster is off to a good start to exercise 30 Day ITP Review. Patient has good participation and attendance in phase 2 cardiac rehab            Comments: See ITP comments.Barnet Pall, RN,BSN 12/02/2019 3:18 PM

## 2019-12-01 NOTE — Telephone Encounter (Signed)
Pt c/o of Chest Pain: STAT if CP now or developed within 24 hours  1. Are you having CP right now? Only on exertion  2. Are you experiencing any other symptoms (ex. SOB, nausea, vomiting, sweating)? No   3. How long have you been experiencing CP? Since 11/27/19  4. Is your CP continuous or coming and going? Continuous with exertion  5. Have you taken Nitroglycerin? No  ?

## 2019-12-01 NOTE — Telephone Encounter (Signed)
Mr. Seldon reports he has full body aching but mostly across his back and shoulders. His chest hurts when he takes a deep breath. He spoke to another healthcare provider and was instructed to get a Covid test. He has tried several pharmacies but has not found a place to test. Per his request and since he is driving, offered to send the patient a MyChart message with the number on the [facility] website to attempt to schedule a test. He understands to call if he needs anything. He was grateful for assistance.

## 2019-12-03 ENCOUNTER — Encounter (HOSPITAL_COMMUNITY): Payer: Medicare Other

## 2019-12-03 ENCOUNTER — Telehealth (HOSPITAL_COMMUNITY): Payer: Self-pay | Admitting: *Deleted

## 2019-12-03 NOTE — Telephone Encounter (Signed)
Left message to call cardiac rehab 

## 2019-12-06 ENCOUNTER — Encounter (HOSPITAL_COMMUNITY): Payer: Medicare Other

## 2019-12-07 ENCOUNTER — Telehealth: Payer: Self-pay

## 2019-12-07 DIAGNOSIS — R079 Chest pain, unspecified: Secondary | ICD-10-CM

## 2019-12-07 NOTE — Telephone Encounter (Signed)
Left message for patient he has been scheduled for an echo after his appointment with [facility] tomorrow.

## 2019-12-07 NOTE — Telephone Encounter (Signed)
Pam from Dr. Jeralene Huff office called and said that Dr. Simonne Come would like from the patient to be seen today due to some serious concerns about the EKG. Please call Dr. Simonne Come at [phone]

## 2019-12-07 NOTE — Addendum Note (Signed)
Addended by: Harland German A on: 12/07/2019 02:24 PM   Modules accepted: Orders

## 2019-12-07 NOTE — Telephone Encounter (Signed)
Dr. Jeralene Huff requests a same day appointment for Dr. Golden Circle. He states last week, the patient had an episode of general malaise, CP, SOB, fatigue. He went to [facility] and a Ddimer was drawn that was positive. A CT was not ordered. The patient was evaluated by Dr. Jeralene Huff yesterday and an EKG was done that showed ST elevation (that he said is likely early repolarization) and a CT was ordered (to be completed this afternoon). The patient had no active symptoms of SOB or CP so he was not sent to the ER, but instead Dr. Jeralene Huff called to schedule the patient with Cardiology today for evaluation and possible repeat echo to look at wall motion.  Called the patient to offer appointment with Dr. Quentin Ore (DOD) this morning. He declined.  Scheduled him tomorrow with [name] for evaluation. Confirmed with the patient he has no CP now. ER precautions reviewed.  The patient reported his insurance denied the CT scan this afternoon. Instructed him to call the testing site and ask for out of pocket cost and recommendations.   Updated Dr. Jeralene Huff and told him the patient is scheduled for evaluation tomorrow. Informed him the patient sounded questionable about having CT done this afternoon as he was told his insurance denied the scan. Dr. Jeralene Huff stated a PA was completed for scan but they will look into it on their end.  Informed Dr. Jeralene Huff EKGs were received and will be reviewed with Dr. Burt Knack this afternoon.  Confirmed with Dr. Jeralene Huff the patient is negative for Covid (he called the office trying to find a testing site last week).  Thanked Dr. Jeralene Huff for his assistance.

## 2019-12-07 NOTE — Telephone Encounter (Signed)
Reviewed records with Dr. Burt Knack. Will arrange limited echo tomorrow while the patient is in the office. He is to keep his appointment with Jerry Foster.

## 2019-12-07 NOTE — Telephone Encounter (Signed)
The pt wants Dr. Burt Knack nurse to give him a call back. He states his primary doctor has some concerns about an EKG and wants Dr. Burt Knack expertise in the situation. Give him a call back at [phone].

## 2019-12-08 ENCOUNTER — Ambulatory Visit: Payer: Medicare Other | Admitting: Physician Assistant

## 2019-12-08 ENCOUNTER — Encounter (HOSPITAL_COMMUNITY): Payer: Medicare Other

## 2019-12-08 ENCOUNTER — Other Ambulatory Visit: Payer: Self-pay

## 2019-12-08 ENCOUNTER — Ambulatory Visit (HOSPITAL_COMMUNITY): Payer: Medicare Other | Attending: Internal Medicine

## 2019-12-08 ENCOUNTER — Encounter: Payer: Self-pay | Admitting: Physician Assistant

## 2019-12-08 VITALS — BP 128/80 | HR 70 | Ht 69.5 in | Wt 164.0 lb

## 2019-12-08 DIAGNOSIS — I2510 Atherosclerotic heart disease of native coronary artery without angina pectoris: Secondary | ICD-10-CM

## 2019-12-08 DIAGNOSIS — J189 Pneumonia, unspecified organism: Secondary | ICD-10-CM

## 2019-12-08 DIAGNOSIS — I350 Nonrheumatic aortic (valve) stenosis: Secondary | ICD-10-CM | POA: Diagnosis not present

## 2019-12-08 DIAGNOSIS — Z950 Presence of cardiac pacemaker: Secondary | ICD-10-CM

## 2019-12-08 DIAGNOSIS — I301 Infective pericarditis: Secondary | ICD-10-CM | POA: Diagnosis not present

## 2019-12-08 DIAGNOSIS — R079 Chest pain, unspecified: Secondary | ICD-10-CM | POA: Diagnosis not present

## 2019-12-08 DIAGNOSIS — Z953 Presence of xenogenic heart valve: Secondary | ICD-10-CM | POA: Diagnosis not present

## 2019-12-08 LAB — ECHOCARDIOGRAM LIMITED
AR max vel: 1.03 cm2
AV Area VTI: 1.23 cm2
AV Area mean vel: 1.16 cm2
AV Mean grad: 15 mmHg
AV Peak grad: 29.4 mmHg
Ao pk vel: 2.71 m/s
Area-P 1/2: 2.36 cm2
Height: 69.5 in
S' Lateral: 3.1 cm
Weight: 2624 oz

## 2019-12-08 MED ORDER — COLCHICINE 0.6 MG PO TABS: 0.6000 mg | ORAL_TABLET | Freq: Two times a day (BID) | ORAL | 0 refills | Status: DC

## 2019-12-08 NOTE — Patient Instructions (Signed)
Medication Instructions:  Your physician has recommended you make the following change in your medication:   1) Start Colchicine 0.6 mg, 1 tablet by mouth twice a day for 3 months then stop  *If you need a refill on your cardiac medications before your next appointment, please call your pharmacy*  Lab Work: None ordered today  Testing/Procedures: None ordered today  Follow-Up: On 12/24/19 at 11:15AM with [name], PA-C  Other Instructions If colchicine twice a day causes any diarrhea, call the office to let us know

## 2019-12-08 NOTE — Progress Notes (Signed)
Cardiology Office Note:    Date:  12/08/2019   ID:  Jerry Foster, DOB October 16, 1950, MRN [id number]  PCP:  Jerry Petty, MD  [facility] Cardiologist:  Jerry Mocha, MD  [facility] Electrophysiologist:  [name], MD   Referring MD: Jerry Petty, MD   Chief Complaint:  Chest Pain    Patient Profile:    Jerry Foster is a 69 y.o. male with:   Aortic stenosis ? S/p min inv bioprosthetic AVR 06/2019 ? Post op c/b AFib and high grade HB >> fall with lac to L eyebrow, C6 avulsion fx >> pacemaker  Coronary artery disease   ? Mild, non-obs dz by cath 05/2019  Persistent, post op atrial fibrillation  ? Amiodarone ? 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 [name] was last seen by Dr. Burt Knack 11/17/2019.  He had been taken off of warfarin.  Device interrogation did not demonstrate further atrial fibrillation.  Patient was recently seen by his PCP for malaise, chest pain, shortness of breath and fatigue.  A D-dimer was elevated.  A chest CT demonstrated no pulmonary embolism.  There were was findings consistent with pneumonia in the right middle lobe and a small left pleural effusion.       Data from PCP personally reviewed and interpreted: Chest x-ray 12/02/2019: No acute cardiopulmonary disease Labs 12/02/2019: SARS-CoV-2 test: Negative.   D-dimer: 4.15 Hgb 16.9, creatinine 0.72, K+ 4.8, ALT 76 EKG 12/02/2019: Normal sinus rhythm, heart rate 74, normal axis, right bundle branch block, ST elevation V3-V5-?  Early repol; EKG done at 1/2 gain EKG 12/06/2019: Normal sinus rhythm, rate 68, normal axis,  right bundle branch block, T wave inversions 3, aVF, early repolarization, QTC 469, similar to prior tracings; EKG done at full gain  Chest CTA 12/07/19 IMPRESSION:  1. No demonstrable pulmonary embolus. No thoracic aortic aneurysm or  dissection. There is aortic atherosclerosis. Patient is status post  aortic valve replacement. Pacemaker leads are attached to the right  atrium and right ventricle.   2. Small focus of apparent pneumonia in the medial segment right  middle lobe. Areas of scarring and atelectasis elsewhere as noted.  There is a small left pleural effusion.   3. No evident adenopathy.   Aortic Atherosclerosis (ICD10-I70.0).    He is here alone today.  He notes that his symptoms are improving.  He has been taking Aleve twice a day.  He did have pleuritic symptoms.  He did not have chest pain with lying supine.  He was not really more short of breath.  He did not have orthopnea, leg swelling or syncope.  Past Medical History:  Diagnosis Date  . Aortic stenosis   . Benign prostatic hyperplasia   . Bronchitis   . Cataract    bilateral   . CHF (congestive heart failure) ([place])   . Colon polyps   . Complete heart block ([place]) 07/28/2019  . Heart murmur   . History of fracture of vertebral column   . Hyperlipidemia   . Kidney stone   . Nonrheumatic aortic valve stenosis   . Osteoporosis   . Osteoporosis   .  Pure hypercholesterolemia   . RBBB   . 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 Sensorineural hearing loss of both ears   . Thrombosed hemorrhoids    hsitory of  . Tubular adenoma of colon   . Vitamin D deficiency     Current Medications: Current Meds  Medication Sig  . acetaminophen (TYLENOL) 500 MG tablet Take 500 mg by mouth every 8 (eight) hours as needed for moderate pain or headache.   [name] amoxicillin  (AMOXIL) 500 MG capsule as directed. For dental appts  . aspirin 81 MG EC tablet Take 1 tablet (81 mg total) by mouth daily.  . Calcium Citrate-Vitamin D (CITRACAL + D PO) Take 2 tablets by mouth in the morning and at bedtime.  . Cholecalciferol (DIALYVITE VITAMIN D 5000) 125 MCG (5000 UT) capsule Take 5,000 Units by mouth daily.  [name] ibuprofen (ADVIL) 200 MG tablet Take 400 mg by mouth every 6 (six) hours as needed for moderate pain.   . metoprolol tartrate (LOPRESSOR) 25 MG tablet Take 1 tablet (25 mg total) by mouth 2 (two) times daily.  . Omega-3 1400 MG CAPS Take 1,400 mg by mouth daily.   . pravastatin (PRAVACHOL) 40 MG tablet Take 40 mg by mouth.     Allergies:   Terbinafine hcl   Social History   Tobacco Use  . Smoking status: Never Smoker  . Smokeless tobacco: Never Used  Vaping Use  . Vaping Use: Never used  Substance Use Topics  . Alcohol use: Yes    Comment: Moderately, 2 bottles wine per week  . Drug use: No     Family Hx: The patient's family history is negative for Colon cancer, Colon polyps, Esophageal cancer, Rectal cancer, and Stomach cancer.  ROS   EKGs/Labs/Other Test Reviewed:    EKG:  EKG is  ordered today.  The ekg ordered today demonstrates normal sinus rhythm, heart rate 69, normal axis, right bundle branch block, T wave inversions 3, aVF, otherwise nonspecific ST-T wave changes.  This is similar to prior tracings.  QTC 490  Recent Labs: 07/31/2019: Magnesium 1.9 09/07/2019: ALT 27; BUN 18; Creatinine, Ser 0.81; Hemoglobin 14.8; Platelets 163; Potassium 4.7; Sodium 137; TSH 1.250   Recent Lipid Panel No results found for: CHOL, TRIG, HDL, CHOLHDL, LDLCALC, LDLDIRECT  Physical Exam:    VS:  BP 128/80   Pulse 70   Ht 5' 9.5" (1.765 m)   Wt 164 lb (74.4 kg)   SpO2 96%   BMI 23.87 kg/m     Wt Readings from Last 3 Encounters:  12/08/19 164 lb (74.4 kg)  11/17/19 159 lb 12.8 oz (72.5 kg)  11/15/19 161 lb 2.5 oz (73.1 kg)     Constitutional:       Appearance: Healthy appearance. Not in distress.  Neck:     Vascular: JVD normal.  Pulmonary:     Effort: Pulmonary effort is normal.     Breath sounds: No wheezing. No rales.  Cardiovascular:     Normal rate. Regular rhythm. Normal S1. Normal S2.     Murmurs: There is a grade 1/6 early systolic murmur at the URSB.     No rub.  Edema:    Peripheral edema absent.  Abdominal:     Palpations: Abdomen is soft.  Skin:    General: Skin is warm and dry.  Neurological:  General: No focal deficit present.     Mental Status: Alert and oriented to person, place and time.     Cranial Nerves: Cranial nerves are intact.      ASSESSMENT & PLAN:    1. Acute viral pericarditis 2. Community acquired pneumonia of right middle lobe of lung His symptoms and EKG seem to be consistent with viral pericarditis.  This likely coincides with viral pneumonia which seems to be resolving.  He has been taking NSAIDs with relief in his symptoms.  I reviewed his EKG today with Dr. Burt Knack who looked at his EKGs yesterday when they were faxed over to our office.  His initial EKG has diffuse ST elevation.  There was less ST elevation on the EKG several days later.  He has no further ST elevation on electrocardiogram today.  I have recommended that he continue Aleve until his chest discomfort is completely resolved.  I have also recommended that he start taking colchicine 0.6 mg twice daily and continue for 3 months.  If he has symptoms of diarrhea with this, we can reduce his dose to once daily.  I will see him back in a few weeks to follow-up.  Of note, he has an echocardiogram pending today to rule out pericardial effusion.  3. Nonrheumatic aortic valve stenosis 4. 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  Continue SBE prophylaxis.  5. Cardiac pacemaker in situ Continue follow-up with EP as planned.  6.  Coronary artery disease involving native coronary artery of native heart without angina pectoris He had mild nonobstructive disease by cardiac catheterization prior to his valve surgery.  He is not having anginal symptoms.  Continue aspirin, pravastatin.    Dispo:  Return in about 2 weeks (around 12/22/2019) for Close Follow Up, w/ [name], PA-C, in person.   Medication Adjustments/Labs and Tests Ordered: Current medicines are reviewed at length with the patient today.  Concerns regarding medicines are outlined above.  Tests Ordered: No orders of the defined types were placed in this encounter.  Medication Changes: Meds ordered this encounter  Medications  . colchicine 0.6 MG tablet    Sig: Take 1 tablet (0.6 mg total) by mouth 2 (two) times daily.    Dispense:  180 tablet    Refill:  0    Signed, [name], PA-C  12/08/2019 5:35 PM    [facility] [address], [place], [place]  [address] Phone: [phone]; Fax: [phone]

## 2019-12-10 ENCOUNTER — Encounter (HOSPITAL_COMMUNITY): Payer: Self-pay | Admitting: *Deleted

## 2019-12-10 ENCOUNTER — Encounter (HOSPITAL_COMMUNITY): Payer: Medicare Other

## 2019-12-10 ENCOUNTER — Telehealth (HOSPITAL_COMMUNITY): Payer: Self-pay | Admitting: *Deleted

## 2019-12-10 DIAGNOSIS — Z952 Presence of prosthetic heart valve: Secondary | ICD-10-CM

## 2019-12-10 NOTE — Progress Notes (Signed)
Discharge Progress Report  Patient Details  Name: Jerry Foster MRN: [id number] Date of Birth: May 24, 1950 Referring Provider:     [place] from 10/19/2019 in [place]  Referring Provider Sherren Mocha, MD       Number of Visits: 11  Reason for Discharge:  Patient reached a stable level of exercise. Patient independent in their exercise. Patient has met program and personal goals.  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  ADL [facility]SD:   Initial Exercise Prescription:  Initial Exercise Prescription - 10/19/19 1100      Date of Initial Exercise RX and Referring Provider   Date 10/19/19    Referring Provider Sherren Mocha, MD    Expected Discharge Date 12/17/19      Treadmill   MPH 3.2    Grade 1    Minutes 15    METs 3.89      NuStep   Level 4    SPM 85    Minutes 15    METs 3      Prescription Details   Frequency (times per week) 3    Duration Progress to 30 minutes of continuous aerobic without signs/symptoms of physical distress      Intensity   THRR 40-80% of Max Heartrate 60-120    Ratings of Perceived Exertion 11-13    Perceived Dyspnea 0-4      Progression   Progression Continue to progress workloads to maintain intensity without signs/symptoms of physical distress.      Resistance Training   Training Prescription Yes    Weight 4lbs    Reps 10-15           Discharge Exercise Prescription (Final Exercise Prescription Changes):  Exercise Prescription Changes - 11/26/19 1638      Response to Exercise   Blood Pressure (Admit) 130/84    Blood Pressure (Exercise) 130/92    Blood Pressure (Exit) 112/60    Heart Rate (Admit) 66 bpm    Heart Rate (Exercise) 98 bpm    Heart Rate (Exit) 66 bpm    Rating of Perceived Exertion (Exercise) 12    Perceived Dyspnea (Exercise) 0    Symptoms None      Comments None    Duration Continue with 30 min of aerobic exercise without signs/symptoms of physical distress.    Intensity THRR unchanged      Progression   Progression Continue to progress workloads to maintain intensity without signs/symptoms of physical distress.    Average METs 4.7      Resistance Training   Training Prescription Yes    Weight 5lbs    Reps 10-15    Time 10 Minutes      Interval Training   Interval Training No      Treadmill   MPH 3.3    Grade 5.5    Minutes 15    METs 6.2      NuStep   Level 5    SPM 110    Minutes 15    METs 4.5      Home Exercise Plan   Plans to continue exercise at Home (comment)   Walking   Frequency Add 2 additional days to program exercise sessions.    Initial Home Exercises Provided 11/15/19           Functional Capacity:  6 Minute Walk  Row Name 10/19/19 0913         6 Minute Walk   Phase Initial     Distance 2335 feet     Walk Time 6 minutes     # of Rest Breaks 0     MPH 4.42     METS 4.98     RPE 13     Perceived Dyspnea  0     VO2 Peak 17.42     Symptoms No     Resting HR 60 bpm     Resting BP 124/70     Resting Oxygen Saturation  97 %     Exercise Oxygen Saturation  during 6 min walk 98 %     Max Ex. HR 84 bpm     Max Ex. BP 158/82     2 Minute Post BP 122/80            Psychological, QOL, Others - Outcomes: PHQ 2/9: Depression screen PHQ 2/9 10/19/2019  Decreased Interest 0  Down, Depressed, Hopeless 0  PHQ - 2 Score 0    Quality of Life:  Quality of Life - 10/19/19 1116      Quality of Life   Select Quality of Life      Quality of Life Scores   Health/Function Pre 26.89 %    Socioeconomic Pre 29.17 %    Psych/Spiritual Pre 21.7 %    Family Pre 30 %    GLOBAL Pre 26.67 %           Personal Goals: Goals established at orientation with interventions provided to work toward goal.  Personal Goals and Risk Factors at Admission - 10/19/19 0904      Core Components/Risk  Factors/Patient Goals on Admission    Weight Management Yes;Weight Loss    Intervention Weight Management: Provide education and appropriate resources to help participant work on and attain dietary goals.;Weight Management: Develop a combined nutrition and exercise program designed to reach desired caloric intake, while maintaining appropriate intake of nutrient and fiber, sodium and fats, and appropriate energy expenditure required for the weight goal.    Admit Weight 161 lb 2.5 oz (73.1 kg)    Goal Weight: Short Term 155 lb (70.3 kg)    Goal Weight: Long Term 151 lb (68.5 kg)    Expected Outcomes Short Term: Continue to assess and modify interventions until short term weight is achieved;Long Term: Adherence to nutrition and physical activity/exercise program aimed toward attainment of established weight goal;Weight Loss: Understanding of general recommendations for a balanced deficit meal plan, which promotes 1-2 lb weight loss per week and includes a negative energy balance of [phone] kcal/d;Understanding recommendations for meals to include 15-35% energy as protein, 25-35% energy from fat, 35-60% energy from carbohydrates, less than 236m of dietary cholesterol, 20-35 gm of total fiber daily;Understanding of distribution of calorie intake throughout the day with the consumption of 4-5 meals/snacks    Lipids Yes    Intervention Provide education and support for participant on nutrition & aerobic/resistive exercise along with prescribed medications to achieve LDL <727m HDL >4061m   Expected Outcomes Short Term: Participant states understanding of desired cholesterol values and is compliant with medications prescribed. Participant is following exercise prescription and nutrition guidelines.;Long Term: Cholesterol controlled with medications as prescribed, with individualized exercise RX and with personalized nutrition plan. Value goals: LDL < 35m38mDL > 40 mg.    Personal Goal Other Yes    Personal  Goal Improve flexibility and balance.  Return to jogging.    Intervention Provide advice, education, support and counseling about physical activity/exercise needs.; Develop an individualized exercise prescription for aerobic and resistive training based on initial evaluation findings, risk stratification, comorbidities and participant's personal goals.    Expected Outcomes Perform stretching exercises routinely during rehab and at home. Improve flexibility and balance as measured by sit and reach test and single leg stand. Improve cardiorespiratory fitness as measured by METs and functional capacity (6MWT). receive MD clearance to return to jogging.            Personal Goals Discharge:  Goals and Risk Factor Review    Row Name 11/03/19 0933 11/30/19 1550           Core Components/Risk Factors/Patient Goals Review   Personal Goals Review Weight Management/Obesity;Lipids;Other Weight Management/Obesity;Lipids;Other      Review [name] is off to a good start to exercise. Jim's vital signs have been stable. [name] has been anxious to increase his work loads. Patient has been advised to increase his work loads slowly. JIm has been doing well with exercise. Patient likes to move up his work loads. Jim's vital signs have been stable.      Expected Outcomes [name] will continue to participate in phase 2 cardiac rehab for exercise, nutrition and lifestyle modification [name] will continue to participate in phase 2 cardiac rehab for exercise, nutrition and lifestyle modification             Exercise Goals and Review:  Exercise Goals    Row Name 10/19/19 0904             Exercise Goals   Increase Physical Activity Yes       Intervention Provide advice, education, support and counseling about physical activity/exercise needs.;Develop an individualized exercise prescription for aerobic and resistive training based on initial evaluation findings, risk stratification, comorbidities and participant's personal  goals.       Expected Outcomes Short Term: Attend rehab on a regular basis to increase amount of physical activity.;Long Term: Exercising regularly at least 3-5 days a week.;Long Term: Add in home exercise to make exercise part of routine and to increase amount of physical activity.       Increase Strength and Stamina Yes       Intervention Provide advice, education, support and counseling about physical activity/exercise needs.;Develop an individualized exercise prescription for aerobic and resistive training based on initial evaluation findings, risk stratification, comorbidities and participant's personal goals.       Expected Outcomes Short Term: Increase workloads from initial exercise prescription for resistance, speed, and METs.;Short Term: Perform resistance training exercises routinely during rehab and add in resistance training at home;Long Term: Improve cardiorespiratory fitness, muscular endurance and strength as measured by increased METs and functional capacity (6MWT)       Able to understand and use rate of perceived exertion (RPE) scale Yes       Intervention Provide education and explanation on how to use RPE scale       Expected Outcomes Short Term: Able to use RPE daily in rehab to express subjective intensity level;Long Term:  Able to use RPE to guide intensity level when exercising independently       Knowledge and understanding of Target Heart Rate Range (THRR) Yes       Intervention Provide education and explanation of THRR including how the numbers were predicted and where they are located for reference       Expected Outcomes Short Term: Able to state/look  up THRR;Long Term: Able to use THRR to govern intensity when exercising independently;Short Term: Able to use daily as guideline for intensity in rehab       Able to check pulse independently Yes       Intervention Provide education and demonstration on how to check pulse in carotid and radial arteries.;Review the importance of  being able to check your own pulse for safety during independent exercise       Expected Outcomes Short Term: Able to explain why pulse checking is important during independent exercise;Long Term: Able to check pulse independently and accurately       Understanding of Exercise Prescription Yes       Intervention Provide education, explanation, and written materials on patient's individual exercise prescription       Expected Outcomes Short Term: Able to explain program exercise prescription;Long Term: Able to explain home exercise prescription to exercise independently              Exercise Goals Re-Evaluation:  Exercise Goals Re-Evaluation    Row Name 10/25/19 1255 11/15/19 1358 12/02/19 1328         Exercise Goal Re-Evaluation   Exercise Goals Review Increase Physical Activity;Increase Strength and Stamina;Able to understand and use rate of perceived exertion (RPE) scale;Knowledge and understanding of Target Heart Rate Range (THRR);Able to check pulse independently;Understanding of Exercise Prescription Increase Physical Activity;Increase Strength and Stamina;Able to understand and use rate of perceived exertion (RPE) scale;Knowledge and understanding of Target Heart Rate Range (THRR);Able to check pulse independently;Understanding of Exercise Prescription Increase Physical Activity;Increase Strength and Stamina;Able to understand and use rate of perceived exertion (RPE) scale;Knowledge and understanding of Target Heart Rate Range (THRR);Able to check pulse independently;Understanding of Exercise Prescription     Comments Pt's first day in [facility] program. Pt tolerated the exercise bout well and understands the exercise Rx, RPE scale and THRR. Reviewed Home Exercise Program with pt. Also discussed THRR, RPE Scale, weather precautions, endpoints of exercise, NTG use, warmup and cool down. Reviewed METs and goals with pt. Pt is enjoying program and states he can tell it is helping.     Expected  Outcomes Will continue to monitor patient and progress as tolerated. Pt will continue to exercise most days of the week, 30-45 minutes. Pt will continue to increase cardiovascular fitness.            Nutrition & Weight - Outcomes:  Pre Biometrics - 10/19/19 0909      Pre Biometrics   Height 5' 9"  (1.753 m)    Waist Circumference 32.5 inches    Hip Circumference 38.25 inches    Waist to Hip Ratio 0.85 %    BMI (Calculated) 23.79    Triceps Skinfold 9 mm    % Body Fat 20.5 %    Grip Strength 40.5 kg    Flexibility 13.13 in    Single Leg Stand 30 seconds            Nutrition:  Nutrition Therapy & Goals - 10/28/19 0707      Nutrition Therapy   Diet Heart Healthy    Drug/Food Interactions Coumadin/Vit K      Personal Nutrition Goals   Nutrition Goal Pt to avoid dark leafy greens since he does not eat them daily    Personal Goal #2 Pt to choose 1 cup veggies with moderate vitamin k daily to have consistent intake of vitamin k daily      Intervention Plan   Intervention Prescribe, educate  and counsel regarding individualized specific dietary modifications aiming towards targeted core components such as weight, hypertension, lipid management, diabetes, heart failure and other comorbidities.;Nutrition handout(s) given to patient.    Expected Outcomes Short Term Goal: A plan has been developed with personal nutrition goals set during dietitian appointment.           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 Jim's attendance was fair. [name] did well with exercise while in the program and liked to move up his work loads. [name] is exercising independently at home and did not return to complete his  post exercise walk test.Malacai Grantz Venetia Maxon, RN,BSN 12/17/2019 1:29 PM

## 2019-12-10 NOTE — Telephone Encounter (Signed)
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 Will discharge from cardiac rehab at this time.  Dr Golden Circle attended 11 exercise sessions between 10/25/19-11/26/19.Barnet Pall, RN,BSN 12/10/2019 8:44 AM

## 2019-12-13 ENCOUNTER — Encounter (HOSPITAL_COMMUNITY): Payer: Medicare Other

## 2019-12-13 NOTE — Addendum Note (Signed)
Encounter addended by: Carma Lair on: 12/13/2019 4:40 PM  Actions taken: Flowsheet data copied forward, Flowsheet accepted

## 2019-12-15 ENCOUNTER — Encounter (HOSPITAL_COMMUNITY): Payer: Medicare Other

## 2019-12-15 NOTE — Addendum Note (Signed)
Addended by: Jeremy Johann on: 12/15/2019 01:17 PM   Modules accepted: Orders

## 2019-12-16 ENCOUNTER — Telehealth: Payer: Self-pay

## 2019-12-16 NOTE — Telephone Encounter (Signed)
Called patient to let him know [name] PA wants him to see EP tomorrow. Made patient an appointment with Dr. Curt Bears, DOD tomorrow.

## 2019-12-16 NOTE — Telephone Encounter (Signed)
Transmission received 12/16/19. There are no episodes noted during this date. 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   Scott, do you still want the patient to follow-up with EP, I do not see any episodes since 12/05/19.  Please advise.

## 2019-12-16 NOTE — Telephone Encounter (Signed)
Calling patient to request manual transmission.  No answer, LMOVM. Unable to leave detailed message d/t no DPR stating that on file. Will send a message requesting through mychart as well.

## 2019-12-16 NOTE — Telephone Encounter (Signed)
Called patient to discuss. Patient has apt. 12/17/19 at 9:30.

## 2019-12-16 NOTE — Telephone Encounter (Signed)
If his pacer is working appropriately, he does not necessarily have to see EP.  But, he should be seen by someone.  He had symptoms c/w pericarditis recently.  He is supposed to be taking Colchicine.  We would need to rule out pericardial effusion.  I do not have any openings.  It does not look like Dr. Burt Knack is in the office.  If he can be seen by the DOD, that would be good.  I think we should leave him on Dr. Macky Lower schedule. Thanks [name], PA-C    12/16/2019 9:17 PM

## 2019-12-16 NOTE — Telephone Encounter (Signed)
I help the pt send a manual transmission with his home monitor. Transmission received. I told him the nurse will review it and give him a call back.

## 2019-12-16 NOTE — Telephone Encounter (Signed)
Called patient about his message. Patient stated he was walking up a hill and then down the hill and felt like he was going to fall out, so he laid down in the grass. Patient stated he was able to call a friend to come pick him up and slowly got better. This all happened on 12/10/19. Then today he was feeling the same way after cutting some shrubs in the yard and came in and checked his heart rate and it was 41. Will send message to device clinic since patient stated his device is suppose to keep him above 60 bpm. Patient stated he never passed out, just almost passed out. Will send message to [employer] as well.

## 2019-12-17 ENCOUNTER — Ambulatory Visit: Payer: Medicare Other | Admitting: Cardiology

## 2019-12-17 ENCOUNTER — Other Ambulatory Visit: Payer: Self-pay

## 2019-12-17 ENCOUNTER — Encounter: Payer: Self-pay | Admitting: Cardiology

## 2019-12-17 ENCOUNTER — Encounter (HOSPITAL_COMMUNITY): Payer: Medicare Other

## 2019-12-17 VITALS — BP 144/98 | HR 63 | Ht 69.5 in | Wt 164.4 lb

## 2019-12-17 DIAGNOSIS — R55 Syncope and collapse: Secondary | ICD-10-CM

## 2019-12-17 NOTE — Progress Notes (Signed)
Electrophysiology Office Note   Date:  12/17/2019   ID:  Jerry Foster, DOB 07-04-1950, MRN [id number]  PCP:  Jefm Petty, MD  Cardiologist:  Burt Knack Primary Electrophysiologist: Allred    Chief Complaint: Near syncope   History of Present Illness: Jerry Foster is a 69 y.o. male who is being seen today for the evaluation of pacemaker at the request of Jefm Petty, MD. Presenting today for electrophysiology evaluation.  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  On 12/10/2019, he was walking up a hill and felt like he was going to pass out.  He laid down in the grass.  Yesterday, he was feeling the same way after cutting some shrubs in the yard.  He checked his heart rate which was found to be 41.  He was near syncopal at the time. He sent in a remote transmission that showed no episodes during this time.  Today, he denies symptoms of palpitations, chest pain, shortness of breath, orthopnea, PND, lower extremity edema, claudication, dizziness,  syncope, bleeding, or neurologic sequela. The patient is tolerating medications without difficulties.  He presents today after an episode of near syncope.  [place]  He had been out walking for quite a while at that point.  He started to feel tired and dizzy.  He walked over to the grass and laid down.  His symptoms lasted a few minutes.  He had to call a friend to get him back to his house.  He had no chest pain or shortness of breath during the episode.  He had no palpitations either.  He has not had any further episodes.  He has felt well since that time.   Past Medical History:  Diagnosis Date  . Aortic stenosis   . Benign prostatic hyperplasia   . Bronchitis   . Cataract    bilateral   . CHF (congestive heart failure) ([place])   . Colon  polyps   . Complete heart block ([place]) 07/28/2019  . Heart murmur   . History of fracture of vertebral column   . Hyperlipidemia   . Kidney stone   . Nonrheumatic aortic valve stenosis   . Osteoporosis   . Osteoporosis   . Pure hypercholesterolemia   . RBBB   . 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 Sensorineural hearing loss of both ears   . Thrombosed hemorrhoids    hsitory of  . Tubular adenoma of colon   . Vitamin D deficiency    Past Surgical History:  Procedure Laterality Date  . AORTIC VALVE REPLACEMENT N/A 07/23/2019   Procedure: MINIMALLY INVASIVE AORTIC VALVE REPLACEMENT (AVR) using Margaretha Sheffield Resilia 21 MM Aortic Valve.;  Surgeon: Rexene Alberts, MD;  Location: [facility];  Service: Open Heart Surgery;  Laterality: N/A;  . CARDIAC CATHETERIZATION    . COLON SURGERY  2006   1 foot ascending colon removed   . COLONOSCOPY    . HERNIA REPAIR    . PACEMAKER IMPLANT N/A 07/29/2019   Procedure: PACEMAKER IMPLANT;  Surgeon: [name], MD;  Location:  [facility];  Service: Cardiovascular;  Laterality: N/A;  . POLYPECTOMY    . RIGHT HEART CATH AND CORONARY ANGIOGRAPHY N/A 06/08/2019  Procedure: RIGHT HEART CATH AND CORONARY ANGIOGRAPHY;  Surgeon: Sherren Mocha, MD;  Location: [facility];  Service: Cardiovascular;  Laterality: N/A;  . TEE WITHOUT CARDIOVERSION N/A 07/23/2019   Procedure: TRANSESOPHAGEAL ECHOCARDIOGRAM (TEE);  Surgeon: Rexene Alberts, MD;  Location: [facility];  Service: Open Heart Surgery;  Laterality: N/A;  . TESTICLE SURGERY       Current Outpatient Medications  Medication Sig Dispense Refill  . acetaminophen (TYLENOL) 500 MG tablet Take 500 mg by mouth every 8 (eight) hours as needed for moderate pain or headache.     [name] amoxicillin (AMOXIL) 500 MG capsule as directed. For dental appts      . aspirin 81 MG EC tablet Take 1 tablet (81 mg total) by mouth daily. 30 tablet   . Calcium Citrate-Vitamin D (CITRACAL + D PO) Take 2 tablets by mouth in the morning and at bedtime.    . Cholecalciferol (DIALYVITE VITAMIN D 5000) 125 MCG (5000 UT) capsule Take 5,000 Units by mouth daily.    . colchicine 0.6 MG tablet Take 1 tablet (0.6 mg total) by mouth 2 (two) times daily. 180 tablet 0  . ibuprofen (ADVIL) 200 MG tablet Take 400 mg by mouth every 6 (six) hours as needed for moderate pain.     . metoprolol tartrate (LOPRESSOR) 25 MG tablet Take 1 tablet (25 mg total) by mouth 2 (two) times daily. 180 tablet 3  . Omega-3 1400 MG CAPS Take 1,400 mg by mouth daily.     . pravastatin (PRAVACHOL) 40 MG tablet Take 40 mg by mouth.     No current facility-administered medications for this visit.    Allergies:   Terbinafine hcl   Social History:  The patient  reports that he has never smoked. He has never used smokeless tobacco. He reports current alcohol use. He reports that he does not use drugs.   Family History:  The patient's family history includes Heart disease in his maternal uncle; Hypertension in his brother.    ROS:  Please see the history of present illness.   Otherwise, review of systems is positive for none.   All other systems are reviewed and negative.    PHYSICAL EXAM: VS:  BP (!) 144/98   Pulse 63   Ht 5' 9.5" (1.765 m)   Wt 164 lb 6.4 oz (74.6 kg)   SpO2 99%   BMI 23.93 kg/m  , BMI Body mass index is 23.93 kg/m. GEN: Well nourished, well developed, in no acute distress  HEENT: normal  Neck: no JVD, carotid bruits, or masses Cardiac: RRR; no murmurs, rubs, or gallops,no edema  Respiratory:  clear to auscultation bilaterally, normal work of breathing GI: soft, nontender, nondistended, + BS MS: no deformity or atrophy  Skin: warm and dry, device pocket is well healed Neuro:  Strength and sensation are intact Psych: euthymic mood, full affect  EKG:  EKG is ordered  today. Personal review of the ekg ordered shows right bundle branch block, sinus rhythm, rate 60.  Device interrogation is reviewed today in detail.  See PaceArt for details.   Recent Labs: 07/31/2019: Magnesium 1.9 09/07/2019: ALT 27; BUN 18; Creatinine, Ser 0.81; Hemoglobin 14.8; Platelets 163; Potassium 4.7; Sodium 137; TSH 1.250    Lipid Panel  No results found for: CHOL, TRIG, HDL, CHOLHDL, VLDL, LDLCALC, LDLDIRECT   Wt Readings from Last 3 Encounters:  12/17/19 164 lb 6.4 oz (74.6 kg)  12/08/19 164 lb (74.4 kg)  11/17/19 159 lb 12.8 oz (  72.5 kg)      Other studies Reviewed: Additional studies/ records that were reviewed today include: TTE 12/08/19  Review of the above records today demonstrates:  1. Left ventricular ejection fraction, by estimation, is 60 to 65%. The  left ventricle has normal function. The left ventricle has no regional  wall motion abnormalities. There is moderate left ventricular hypertrophy.  Left ventricular diastolic  parameters are consistent with Grade I diastolic dysfunction (impaired  relaxation).  2. Right ventricular systolic function is normal. The right ventricular  size is normal.  3. The aortic valve has been repaired/replaced. There is a 21 mm Edwards  Inspiris Resilia valve present in the aortic position. Echo findings are  consistent with normal structure and function of the aortic valve  prosthesis. Aortic valve area, by VTI  measures 1.23 cm. Aortic valve mean gradient measures 15.0 mmHg. Aortic  valve Vmax measures 2.71 m/s. Peak gradient 29 mmHg. DI 0.36.  4. The inferior vena cava is dilated in size with >50% respiratory  variability, suggesting right atrial pressure of 8 mmHg.    ASSESSMENT AND PLAN:  1.  Mobitz 2 AV block: Status post Saint Jude dual-chamber pacemaker implanted 07/29/2019.  Device functioning appropriately.  No changes  2.  Nonrheumatic aortic stenosis: Status post minimally invasive aortic valve  replacement with bioprosthetic valve.  Normal function on most recent echo.  3.  Acute viral pericarditis with community-acquired pneumonia: Currently on NSAIDs and colchicine.  Has been feeling improved since starting colchicine.  4.  near syncope: Patient was walking down the street.  He had had coffee and a muffin that morning, but nothing else to eat.  This is a normal breakfast for him.  Based on device interrogation, he has had no arrhythmias.  His ECG is essentially unchanged.  He has a normal exam without evidence of volume overload, and no evidence of a pericardial effusion on exam.  Since this is happened 1 time and has not been repeated, I told him to call us back if there are any further issues.  If so, further imaging may be necessary.  Case discussed with primary cardiology  Current medicines are reviewed at length with the patient today.   The patient does not have concerns regarding his medicines.  The following changes were made today:  none  Labs/ tests ordered today include:  Orders Placed This Encounter  Procedures  . EKG 12-Lead    Signed, Mylea Roarty Meredith Leeds, MD  12/17/2019 1:12 PM     [address] [place] [address] [phone] (office) [phone] (fax)

## 2019-12-23 NOTE — Progress Notes (Signed)
Cardiology Office Note:    Date:  12/24/2019   ID:  Jerry Foster, DOB [birth year], MRN [id number]  PCP:  Jerry Petty, MD  [facility] Cardiologist:  Jerry Mocha, MD  [facility] Electrophysiologist:  Jerry Grayer, MD   Referring MD: Jerry Petty, MD   Chief Complaint:  Follow-up (Pericarditis)    Patient Profile:    Jerry Foster is a 69 y.o. male with:   Aortic stenosis ? S/p min inv bioprosthetic AVR 06/2019 ? Post op c/b AFib and high grade HB >> fall with lac to L eyebrow, C6 avulsion fx >> pacemaker  Coronary artery disease   ? Mild, non-obs dz by cath 05/2019  Persistent, post op atrial fibrillation  ? Amiodarone ? CHA2DS2-VASc=2 (CAD, age x 1) >> Warfarin  Mobitz II AV block ? S/p Pacemaker   Hyperlipidemia  RBBB  Pericarditis (11/2019)  Prior CV studies: Limited echo 12/08/2019 EF 60-65, no RWMA, moderate LVH, GR 1 DD, normal RVSF, normally functioning AVR with mean gradient 15 mmHg  Echocardiogram 09/14/2019 EF 50-55, no RWMA, mild LVH, GR 1 DD, normal RVSP, trivial MR, s/p AVR functioning normally  Pre-CABG Dopplers 07/19/2019 Bilateral ICA 1-39  Cardiac catheterization 06/08/2019 LAD mid 25 LCx minimal irregularities RCA ostial 30  History of Present Illness:    Jerry Foster was last seen 15/21.  He had been seen by his PCP for complaints of malaise, chest pain and shortness of breath.  CT was negative for PE.  He had ST elevation, diffusely on his electrocardiogram.  Serial EKGs demonstrated resolution of ST elevation.  The patient had been taking NSAIDs when I saw him and his symptoms were almost resolved.  We felt that he likely had pericarditis.  He had had a recent illness and it was suspected that this was viral.  He was placed on colchicine for 3 months.  Follow-up echocardiogram demonstrated no pericardial effusion.  He recently contacted the office with symptoms of near syncope.  He saw Dr. Curt Foster (DOD) for an acute visit.  He had no  arrhythmias on device interrogation he was suspected that he was somewhat dehydrated and no further testing was indicated.  He returns for follow-up. He is here alone. He continues to feel weak/fatigued and also has some difficulty with balance. He felt like he was doing really well until he got sick and now is feeling like he took a few steps back. He sometimes takes a cane with him or walks with a friend to make sure he does not fall. He has not had chest discomfort or significant shortness of breath. He has not had orthopnea, lower extremity swelling or syncope.   Past Medical History:  Diagnosis Date  . Aortic stenosis   . Benign prostatic hyperplasia   . Bronchitis   . Cataract    bilateral   . CHF (congestive heart failure) ([place])   . Colon polyps   . Complete heart block ([place]) 07/28/2019  . Heart murmur   . History of fracture of vertebral column   . Hyperlipidemia   . Kidney stone   . Nonrheumatic aortic valve stenosis   . Osteoporosis   . Osteoporosis   . Pure hypercholesterolemia   . RBBB   . 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 Sensorineural hearing loss of both ears   . Thrombosed hemorrhoids    hsitory of  .  Tubular adenoma of colon   . Vitamin D deficiency     Current Medications: Current Meds  Medication Sig  . acetaminophen (TYLENOL) 500 MG tablet Take 500 mg by mouth every 8 (eight) hours as needed for moderate pain or headache.   [name] amoxicillin (AMOXIL) 500 MG capsule as directed. For dental appts  . aspirin 81 MG EC tablet Take 1 tablet (81 mg total) by mouth daily.  . Calcium Citrate-Vitamin D (CITRACAL + D PO) Take 2 tablets by mouth in the morning and at bedtime.  . Cholecalciferol (DIALYVITE VITAMIN D 5000) 125 MCG (5000 UT) capsule Take 5,000 Units by mouth daily.  . colchicine 0.6 MG tablet Take 1  tablet (0.6 mg total) by mouth 2 (two) times daily.  [name] ibuprofen (ADVIL) 200 MG tablet Take 400 mg by mouth every 6 (six) hours as needed for moderate pain.   . metoprolol tartrate (LOPRESSOR) 25 MG tablet Take 1 tablet (25 mg total) by mouth 2 (two) times daily.  . Omega-3 1400 MG CAPS Take 1,400 mg by mouth daily.   . pravastatin (PRAVACHOL) 40 MG tablet Take 40 mg by mouth daily.      Allergies:   Terbinafine hcl   Social History   Tobacco Use  . Smoking status: Never Smoker  . Smokeless tobacco: Never Used  Vaping Use  . Vaping Use: Never used  Substance Use Topics  . Alcohol use: Yes    Comment: Moderately, 2 bottles wine per week  . Drug use: No     Family Hx: The patient's family history includes Heart disease in his maternal uncle; Hypertension in his brother. There is no history of Colon cancer, Colon polyps, Esophageal cancer, Rectal cancer, or Stomach cancer.  Review of Systems  Constitutional: Negative for fever.  Respiratory: Negative for cough.   Gastrointestinal: Negative for hematochezia and melena.  Genitourinary: Negative for hematuria.  All other systems reviewed and are negative.     EKGs/Labs/Other Test Reviewed:    EKG:  EKG is not ordered today.  The ekg ordered today demonstrates n/a  Recent Labs: 07/31/2019: Magnesium 1.9 09/07/2019: ALT 27; BUN 18; Creatinine, Ser 0.81; Hemoglobin 14.8; Platelets 163; Potassium 4.7; Sodium 137; TSH 1.250   Recent Lipid Panel No results found for: CHOL, TRIG, HDL, CHOLHDL, LDLCALC, LDLDIRECT  Physical Exam:    VS:  BP 128/80   Pulse 61   Ht 5' 9.5" (1.765 m)   Wt 161 lb (73 kg)   SpO2 96%   BMI 23.43 kg/m     Wt Readings from Last 3 Encounters:  12/24/19 161 lb (73 kg)  12/17/19 164 lb 6.4 oz (74.6 kg)  12/08/19 164 lb (74.4 kg)     Constitutional:      Appearance: Healthy appearance. Not in distress.  Neck:     Thyroid: No thyromegaly.     Vascular: JVD normal.  Pulmonary:     Effort: Pulmonary  effort is normal.     Breath sounds: No wheezing. No rales.  Cardiovascular:     Normal rate. Regular rhythm. Normal S1. Normal S2.     Murmurs: There is a grade 2/6 early systolic murmur at the URSB.  Edema:    Peripheral edema absent.  Abdominal:     Palpations: Abdomen is soft. There is no hepatomegaly.  Skin:    General: Skin is warm and dry.  Neurological:     General: No focal deficit present.     Mental Status: Alert and oriented  to person, place and time.     Cranial Nerves: Cranial nerves are intact.       ASSESSMENT & PLAN:   1. Other fatigue I suspect he is deconditioned from recent viral pneumonia and associated pericarditis. I advised him to scale back his activity and slowly build up again. I also asked him to remain well-hydrated. I will obtain a BMET, CBC and TSH today to rule out other metabolic causes.   2. Acute viral pericarditis His pain is resolved. He will continue on colchicine for a total of 3 months to prevent recurrence.  3. Nonrheumatic aortic valve stenosis 4. S/P minimally-invasive aortic valve replacement with bioprosthetic valve Most recent echocardiogram with normally functioning AVR. Continue SBE prophylaxis.  5. Cardiac pacemaker in situ Continue follow-up with EP as planned.  6. Coronary artery disease involving native coronary artery of native heart without angina pectoris Mild nonobstructive disease by cardiac catheterization prior to valve surgery. He is not having angina. Continue aspirin, pravastatin.     Dispo:  Return in about 3 months (around 03/25/2020) for Routine Follow Up, w/ Dr. Burt Knack, in person.   Medication Adjustments/Labs and Tests Ordered: Current medicines are reviewed at length with the patient today.  Concerns regarding medicines are outlined above.  Tests Ordered: Orders Placed This Encounter  Procedures  . Basic metabolic panel  . CBC  . TSH   Medication Changes: No orders of the defined types were placed in  this encounter.   Signed, [name], PA-C  12/24/2019 1:27 PM    [facility] [address], [place], [place]  [address] Phone: [phone]; Fax: [phone]

## 2019-12-24 ENCOUNTER — Other Ambulatory Visit: Payer: Self-pay

## 2019-12-24 ENCOUNTER — Ambulatory Visit (INDEPENDENT_AMBULATORY_CARE_PROVIDER_SITE_OTHER): Payer: Medicare Other | Admitting: Physician Assistant

## 2019-12-24 ENCOUNTER — Encounter: Payer: Self-pay | Admitting: Physician Assistant

## 2019-12-24 VITALS — BP 128/80 | HR 61 | Ht 69.5 in | Wt 161.0 lb

## 2019-12-24 DIAGNOSIS — Z950 Presence of cardiac pacemaker: Secondary | ICD-10-CM

## 2019-12-24 DIAGNOSIS — R5383 Other fatigue: Secondary | ICD-10-CM

## 2019-12-24 DIAGNOSIS — Z953 Presence of xenogenic heart valve: Secondary | ICD-10-CM

## 2019-12-24 DIAGNOSIS — I301 Infective pericarditis: Secondary | ICD-10-CM

## 2019-12-24 DIAGNOSIS — I350 Nonrheumatic aortic (valve) stenosis: Secondary | ICD-10-CM | POA: Diagnosis not present

## 2019-12-24 DIAGNOSIS — I2510 Atherosclerotic heart disease of native coronary artery without angina pectoris: Secondary | ICD-10-CM

## 2019-12-24 LAB — CBC
Hematocrit: 48.9 % (ref 37.5–51.0)
Hemoglobin: 16.3 g/dL (ref 13.0–17.7)
MCH: 30.1 pg (ref 26.6–33.0)
MCHC: 33.3 g/dL (ref 31.5–35.7)
MCV: 90 fL (ref 79–97)
Platelets: 162 10*3/uL (ref 150–450)
RBC: 5.41 x10E6/uL (ref 4.14–5.80)
RDW: 13.4 % (ref 11.6–15.4)
WBC: 6.1 10*3/uL (ref 3.4–10.8)

## 2019-12-24 LAB — BASIC METABOLIC PANEL
BUN/Creatinine Ratio: 22 (ref 10–24)
BUN: 19 mg/dL (ref 8–27)
CO2: 26 mmol/L (ref 20–29)
Calcium: 9.9 mg/dL (ref 8.6–10.2)
Chloride: 100 mmol/L (ref 96–106)
Creatinine, Ser: 0.85 mg/dL (ref 0.76–1.27)
GFR calc Af Amer: 102 mL/min/{1.73_m2} (ref >59)
GFR calc non Af Amer: 88 mL/min/{1.73_m2} (ref >59)
Glucose: 92 mg/dL (ref 65–99)
Potassium: 4.2 mmol/L (ref 3.5–5.2)
Sodium: 137 mmol/L (ref 134–144)

## 2019-12-24 LAB — TSH: TSH: 1.15 u[IU]/mL (ref 0.450–4.500)

## 2019-12-24 NOTE — Patient Instructions (Signed)
Medication Instructions:  Your physician recommends that you continue on your current medications as directed. Please refer to the Current Medication list given to you today.  *If you need a refill on your cardiac medications before your next appointment, please call your pharmacy*  Lab Work: You will have labs drawn today: BMET/CBC/TSH  If you have labs (blood work) drawn today and your tests are completely normal, you will receive your results only by: [name] MyChart Message (if you have MyChart) OR . A paper copy in the mail If you have any lab test that is abnormal or we need to change your treatment, we will call you to review the results.  Testing/Procedures: None ordered today  Follow-Up: At [facility], you and your health needs are our priority.  As part of our continuing mission to provide you with exceptional heart care, we have created designated Provider Care Teams.  These Care Teams include your primary Cardiologist (physician) and Advanced Practice Providers (APPs -  Physician Assistants and Nurse Practitioners) who all work together to provide you with the care you need, when you need it.  Your next appointment:   4 month(s)  The format for your next appointment:   In Person  Provider:   Sherren Mocha, MD

## 2020-01-28 ENCOUNTER — Telehealth: Payer: Self-pay | Admitting: Emergency Medicine

## 2020-01-28 ENCOUNTER — Ambulatory Visit (INDEPENDENT_AMBULATORY_CARE_PROVIDER_SITE_OTHER): Payer: Medicare Other

## 2020-01-28 DIAGNOSIS — I442 Atrioventricular block, complete: Secondary | ICD-10-CM | POA: Diagnosis not present

## 2020-01-28 LAB — CUP PACEART REMOTE DEVICE CHECK
Battery Remaining Longevity: 134 mo
Battery Remaining Percentage: 95.5 %
Battery Voltage: 3.02 V
Brady Statistic AP VP Percent: 1 %
Brady Statistic AP VS Percent: 21 %
Brady Statistic AS VP Percent: 1 %
Brady Statistic AS VS Percent: 79 %
Brady Statistic RA Percent Paced: 20 %
Brady Statistic RV Percent Paced: 1 %
Date Time Interrogation Session: 20211105020013
Implantable Lead Implant Date: 20210506
Implantable Lead Implant Date: 20210506
Implantable Lead Location: 753859
Implantable Lead Location: 753860
Implantable Pulse Generator Implant Date: 20210506
Lead Channel Impedance Value: 450 Ohm
Lead Channel Impedance Value: 530 Ohm
Lead Channel Pacing Threshold Amplitude: 0.625 V
Lead Channel Pacing Threshold Amplitude: 0.625 V
Lead Channel Pacing Threshold Pulse Width: 0.4 ms
Lead Channel Pacing Threshold Pulse Width: 0.4 ms
Lead Channel Sensing Intrinsic Amplitude: 1.6 mV
Lead Channel Sensing Intrinsic Amplitude: 5.6 mV
Lead Channel Setting Pacing Amplitude: 0.875
Lead Channel Setting Pacing Amplitude: 1.625
Lead Channel Setting Pacing Pulse Width: 0.4 ms
Lead Channel Setting Sensing Sensitivity: 2 mV
Pulse Gen Model: 2272
Pulse Gen Serial Number: 3825759

## 2020-01-28 NOTE — Progress Notes (Signed)
Remote pacemaker transmission.   

## 2020-01-28 NOTE — Telephone Encounter (Signed)
Alert received for episode of AF on 01/10/20 with EGM that shows AF with controlled v-rates that for 3 hours 52 minutes, no OAC. Hx AF in past .

## 2020-02-05 NOTE — Telephone Encounter (Signed)
Continue to follow.

## 2020-02-10 ENCOUNTER — Telehealth: Payer: Self-pay | Admitting: Emergency Medicine

## 2020-02-10 NOTE — Telephone Encounter (Signed)
Received alert for Afib with RVR episode in progress at time of remote transmission 02/10/20 @ 0213 am. Patient had episode of AF that lasted 15 hours  , 13 minutes on 02/09/20. No OAC. Hx of AF episodes .Reports feeling fatigue and "wobbly" 02/09/20. No CP, syncope, SOB, chest pressure. No missed doses of metoprolol 25 mg BID. Will send remote transmission when he gets home for evaluation of current EGM. ED precautions given.

## 2020-02-10 NOTE — Telephone Encounter (Signed)
Patient called back for help to send a transmission to see if he is still in Afib.

## 2020-02-10 NOTE — Telephone Encounter (Signed)
Remote transmission reviewed. Presenting EGM show AF with controlled v-rates. Will refer to AF Clinic . Patient agreeable to appointment with AF Clinic. Will expect call to schedule appointment with AF Clinic.

## 2020-02-11 ENCOUNTER — Other Ambulatory Visit: Payer: Self-pay

## 2020-02-11 ENCOUNTER — Ambulatory Visit (HOSPITAL_COMMUNITY)
Admission: RE | Admit: 2020-02-11 | Discharge: 2020-02-11 | Disposition: A | Discharge status: Home / Self Care | Payer: Medicare Other | Source: Ambulatory Visit | Attending: Physician Assistant | Admitting: Physician Assistant

## 2020-02-11 VITALS — BP 116/86 | HR 74 | Ht 69.5 in | Wt 167.4 lb

## 2020-02-11 DIAGNOSIS — D6869 Other thrombophilia: Secondary | ICD-10-CM | POA: Diagnosis not present

## 2020-02-11 DIAGNOSIS — Z79899 Other long term (current) drug therapy: Secondary | ICD-10-CM | POA: Diagnosis not present

## 2020-02-11 DIAGNOSIS — Z952 Presence of prosthetic heart valve: Secondary | ICD-10-CM | POA: Insufficient documentation

## 2020-02-11 DIAGNOSIS — I483 Typical atrial flutter: Secondary | ICD-10-CM | POA: Diagnosis not present

## 2020-02-11 DIAGNOSIS — Z7901 Long term (current) use of anticoagulants: Secondary | ICD-10-CM | POA: Insufficient documentation

## 2020-02-11 DIAGNOSIS — I4819 Other persistent atrial fibrillation: Secondary | ICD-10-CM | POA: Insufficient documentation

## 2020-02-11 DIAGNOSIS — I4892 Unspecified atrial flutter: Secondary | ICD-10-CM | POA: Insufficient documentation

## 2020-02-11 DIAGNOSIS — I350 Nonrheumatic aortic (valve) stenosis: Secondary | ICD-10-CM | POA: Insufficient documentation

## 2020-02-11 DIAGNOSIS — Z950 Presence of cardiac pacemaker: Secondary | ICD-10-CM | POA: Insufficient documentation

## 2020-02-11 DIAGNOSIS — I2510 Atherosclerotic heart disease of native coronary artery without angina pectoris: Secondary | ICD-10-CM | POA: Insufficient documentation

## 2020-02-11 MED ORDER — APIXABAN 5 MG PO TABS
5.0000 mg | ORAL_TABLET | Freq: Two times a day (BID) | ORAL | 3 refills | Status: DC
Start: 2020-02-11 — End: 2020-05-04

## 2020-02-11 NOTE — Patient Instructions (Signed)
Start Eliquis 5mg  - Take one tablet by mouth twice daily

## 2020-02-11 NOTE — Telephone Encounter (Signed)
Called and spoke with patient, he is aware of appt today at 10:30 am with Adline Peals, PA.

## 2020-02-11 NOTE — Progress Notes (Signed)
Primary Care Physician: Jefm Petty, MD Primary Cardiologist: Dr Burt Knack Primary Electrophysiologist: Dr Rayann Heman Referring Physician: Dr [name] Jerry Foster is a 69 y.o. male with a history of RBBB, AS s/p AVR, HLD, tachybradycardia syndrome, transient CHB s/p PPM, persistent atrial fibrillation, and atrial flutter who presents for follow up in the [facility]. The patient was admitted electively and on 07/23/2019 he was taken the operating room where he underwent AVR.  He did develop postoperative atrial fibrillation as well as significant heart block. On 07/28/2019 he had a fall in the bathroom that was felt to be related to the heart block and sustained a significant laceration in the region of his left eyebrow requiring plastic surgery repair. His CT scan was also done at that time and showed an avulsion fracture the anterior inferior aspect of the C6 vertebral body. He was placed in a soft cervical immobilizer. CT scan of the head was unremarkable. Neurosurgery will follow up as an outpatient. He was seen by Dr Rayann Heman and a PPM was implanted. He has been started on Coumadin with a CHADS2VASC score of 2. He was also started on amiodarone and converted to SR prior to discharge. His amiodarone and warfarin were eventually discontinued as he had no recurrence of afib.  On follow up today, patient has had recurrence of afib noted on his PPM 01/10/20 and 02/10/20. He appears to be in typical atrial flutter today. Overall he is fairly asymptomatic but he does not some increased fatigue. There were no specific triggers that he could identify.   Today, he denies symptoms of palpitations, shortness of breath, orthopnea, PND, lower extremity edema, dizziness, presyncope, syncope, snoring, daytime somnolence, bleeding, or neurologic sequela. The patient is tolerating medications without difficulties and is otherwise without complaint today.    Atrial Fibrillation Risk  Factors:  he does not have symptoms or diagnosis of sleep apnea.   he has a BMI of Body mass index is 24.37 kg/m.[name] Filed Weights   02/11/20 1028  Weight: 75.9 kg    Family History  Problem Relation Age of Onset   Heart disease Maternal Uncle    Hypertension Brother    Colon cancer Neg Hx    Colon polyps Neg Hx    Esophageal cancer Neg Hx    Rectal cancer Neg Hx    Stomach cancer Neg Hx      Atrial Fibrillation Management history:  Previous antiarrhythmic drugs: amiodarone Previous cardioversions: none Previous ablations: none CHADS2VASC score: 2 Anticoagulation history: warfarin   Past Medical History:  Diagnosis Date   Aortic stenosis    Benign prostatic hyperplasia    Bronchitis    Cataract    bilateral    CHF (congestive heart failure) (HCC)    Colon polyps    Complete heart block ([place]) 07/28/2019   Heart murmur    History of fracture of vertebral column    Hyperlipidemia    Kidney stone    Nonrheumatic aortic valve stenosis    Osteoporosis    Osteoporosis    Pure hypercholesterolemia    RBBB    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   21 mm Edwards Inspiris Resilia stented bovine pericardial tissue valve via right mini thoracotomy approach   Sensorineural hearing loss of both ears    Thrombosed hemorrhoids    hsitory of   Tubular adenoma of colon    Vitamin D deficiency    Past Surgical History:  Procedure Laterality  Date   AORTIC VALVE REPLACEMENT N/A 07/23/2019   Procedure: MINIMALLY INVASIVE AORTIC VALVE REPLACEMENT (AVR) using Margaretha Sheffield Resilia 21 MM Aortic Valve.;  Surgeon: Rexene Alberts, MD;  Location: [place];  Service: Open Heart Surgery;  Laterality: N/A;   CARDIAC CATHETERIZATION     COLON SURGERY  2006   1 foot ascending colon removed    COLONOSCOPY     HERNIA REPAIR     PACEMAKER IMPLANT N/A 07/29/2019   Procedure: PACEMAKER IMPLANT;  Surgeon: [name], MD;  Location: [place] [facility];  Service: Cardiovascular;  Laterality: N/A;   POLYPECTOMY     RIGHT HEART CATH AND CORONARY ANGIOGRAPHY N/A 06/08/2019   Procedure: RIGHT HEART CATH AND CORONARY ANGIOGRAPHY;  Surgeon: Sherren Mocha, MD;  Location: [place] [facility];  Service: Cardiovascular;  Laterality: N/A;   TEE WITHOUT CARDIOVERSION N/A 07/23/2019   Procedure: TRANSESOPHAGEAL ECHOCARDIOGRAM (TEE);  Surgeon: Rexene Alberts, MD;  Location: [place];  Service: Open Heart Surgery;  Laterality: N/A;   TESTICLE SURGERY      Current Outpatient Medications  Medication Sig Dispense Refill   acetaminophen (TYLENOL) 500 MG tablet Take 500 mg by mouth every 8 (eight) hours as needed for moderate pain or headache.      amoxicillin (AMOXIL) 500 MG capsule as directed. For dental appts     aspirin 81 MG EC tablet Take 1 tablet (81 mg total) by mouth daily. 30 tablet    Calcium Citrate-Vitamin D (CITRACAL + D PO) Take 2 tablets by mouth in the morning and at bedtime.     Cholecalciferol (DIALYVITE VITAMIN D 5000) 125 MCG (5000 UT) capsule Take 5,000 Units by mouth daily.     colchicine 0.6 MG tablet Take 1 tablet (0.6 mg total) by mouth 2 (two) times daily. 180 tablet 0   ibuprofen (ADVIL) 200 MG tablet Take 400 mg by mouth every 6 (six) hours as needed for moderate pain.      metoprolol tartrate (LOPRESSOR) 25 MG tablet Take 1 tablet (25 mg total) by mouth 2 (two) times daily. 180 tablet 3   Omega-3 1400 MG CAPS Take 1,400 mg by mouth daily.      pravastatin (PRAVACHOL) 40 MG tablet Take 40 mg by mouth daily.      apixaban (ELIQUIS) 5 MG TABS tablet Take 1 tablet (5 mg total) by mouth 2 (two) times daily. 60 tablet 3   No current facility-administered medications for this encounter.    Allergies  Allergen Reactions   Terbinafine Hcl Other (See Comments)    Elevated LFT Lamisil     Social History   Socioeconomic History   Marital status: Single    Spouse name: Not  on file   Number of children: Not on file   Years of education: Not on file   Highest education level: Professional school degree (e.g., MD, DDS, DVM, JD)  Occupational History   Occupation: Retired Pharmacist, community  Tobacco Use   Smoking status: Never Smoker   Smokeless tobacco: Never Used  Scientific laboratory technician Use: Never used  Substance and Sexual Activity   Alcohol use: Yes    Comment: Moderately, 2 bottles wine per week   Drug use: No   Sexual activity: Not on file  Other Topics Concern   Not on file  Social History Narrative   Not on file   Social Determinants of Health   Financial Resource Strain:    Difficulty of Paying Living Expenses: Not on  file  Food Insecurity:    Worried About Charity fundraiser in the Last Year: Not on file   [employer] of Food in the Last Year: Not on file  Transportation Needs:    Lack of Transportation (Medical): Not on file   Lack of Transportation (Non-Medical): Not on file  Physical Activity:    Days of Exercise per Week: Not on file   Minutes of Exercise per Session: Not on file  Stress:    Feeling of Stress : Not on file  Social Connections:    Frequency of Communication with Friends and Family: Not on file   Frequency of Social Gatherings with Friends and Family: Not on file   Attends Religious Services: Not on file   Active Member of Clubs or Organizations: Not on file   Attends Archivist Meetings: Not on file   Marital Status: Not on file  Intimate Partner Violence:    Fear of Current or Ex-Partner: Not on file   Emotionally Abused: Not on file   Physically Abused: Not on file   Sexually Abused: Not on file     ROS- All systems are reviewed and negative except as per the HPI above.  Physical Exam: Vitals:   02/11/20 1028  BP: 116/86  Pulse: 74  Weight: 75.9 kg  Height: 5' 9.5" (1.765 m)    GEN- The patient is well appearing, alert and oriented x 3 today.   HEENT-head normocephalic,  atraumatic, sclera clear, conjunctiva pink, hearing intact, trachea midline. Lungs- Clear to ausculation bilaterally, normal work of breathing Heart- irregular rate and rhythm, no murmurs, rubs or gallops  GI- soft, NT, ND, + BS Extremities- no clubbing, cyanosis, or edema MS- no significant deformity or atrophy Skin- no rash or lesion Psych- euthymic mood, full affect Neuro- strength and sensation are intact   Wt Readings from Last 3 Encounters:  02/11/20 75.9 kg  12/24/19 73 kg  12/17/19 74.6 kg    EKG today demonstrates typical atrial flutter with primarily 4:1 block, RBBB, QRS 142, QTc 457  Echo 12/08/19 demonstrated  1. Left ventricular ejection fraction, by estimation, is 60 to 65%. The  left ventricle has normal function. The left ventricle has no regional  wall motion abnormalities. There is moderate left ventricular hypertrophy.  Left ventricular diastolic  parameters are consistent with Grade I diastolic dysfunction (impaired  relaxation).  2. Right ventricular systolic function is normal. The right ventricular  size is normal.  3. The aortic valve has been repaired/replaced. There is a 21 mm Edwards  Inspiris Resilia valve present in the aortic position. Echo findings are  consistent with normal structure and function of the aortic valve  prosthesis. Aortic valve area, by VTI  measures 1.23 cm. Aortic valve mean gradient measures 15.0 mmHg. Aortic  valve Vmax measures 2.71 m/s. Peak gradient 29 mmHg. DI 0.36.  4. The inferior vena cava is dilated in size with >50% respiratory  variability, suggesting right atrial pressure of 8 mmHg.   Comparison(s): Changes from prior study are noted. 09/14/2019: LVEF 50-55%,  21 mm Edwards Inspiris Resilia valve, mean gradient 8.5 mmHg.  Epic records are reviewed at length today  CHA2DS2-VASc Score = 2  The patient's score is based upon: CHF History: 0 HTN History: 0 Diabetes History: 0 Stroke History: 0 Vascular Disease  History: 1 Age Score: 1 Gender Score: 0      ASSESSMENT AND PLAN: 1. Persistent Atrial Fibrillation/atrial flutter The patient's CHA2DS2-VASc score is 2, indicating  a 2.2% annual risk of stroke.   Patient in rate controlled typical atrial flutter today. We discussed therapeutic options today. Patient agreeable to discussing flutter ablation. Could also consider atrial therapies on device. Will start Eliquis 5 mg BID. Recent bmet/CBC reviewed.  Toprol 50 mg BID  2. Secondary Hypercoagulable State (ICD10:  D68.69) The patient is at significant risk for stroke/thromboembolism based upon his CHA2DS2-VASc Score of 2.  Start Apixaban (Eliquis).   3. Transient CHB S/p PPM, followed by Dr Rayann Heman and the device clinic.  4. Aortic stenosis S/p bioprosthetic AVR 06/2019.  5. CAD Nonobstructive by cath 05/2019 No anginal symptoms.   Follow up with Dr Rayann Heman.    [facility] [address] [place], [place] [address] [phone] 02/11/2020 11:18 AM

## 2020-02-21 ENCOUNTER — Encounter: Payer: Self-pay | Admitting: Internal Medicine

## 2020-02-21 ENCOUNTER — Telehealth (INDEPENDENT_AMBULATORY_CARE_PROVIDER_SITE_OTHER): Payer: Medicare Other | Admitting: Internal Medicine

## 2020-02-21 ENCOUNTER — Telehealth: Payer: Self-pay | Admitting: *Deleted

## 2020-02-21 ENCOUNTER — Other Ambulatory Visit: Payer: Self-pay

## 2020-02-21 VITALS — BP 142/71 | HR 71 | Ht 69.5 in | Wt 165.0 lb

## 2020-02-21 DIAGNOSIS — I483 Typical atrial flutter: Secondary | ICD-10-CM

## 2020-02-21 DIAGNOSIS — I4819 Other persistent atrial fibrillation: Secondary | ICD-10-CM

## 2020-02-21 DIAGNOSIS — D6869 Other thrombophilia: Secondary | ICD-10-CM | POA: Diagnosis not present

## 2020-02-21 DIAGNOSIS — I4891 Unspecified atrial fibrillation: Secondary | ICD-10-CM

## 2020-02-21 NOTE — Progress Notes (Signed)
Electrophysiology TeleHealth Note  Due to national recommendations of social distancing due to [place] 19, an audio telehealth visit is felt to be most appropriate for this patient at this time.  Verbal consent was obtained by me for the telehealth visit today.  The patient does not have capability for a virtual visit.  A phone visit is therefore required today.   Date:  02/21/2020   ID:  Jerry Foster, DOB [birth year], MRN [id number]  Location: patient's home  Provider location:  [facility]  Evaluation Performed: Follow-up visit  PCP:  Jefm Petty, MD   Electrophysiologist:  Dr Rayann Heman  Chief Complaint:  palpitations  History of Present Illness:    Jerry Foster is a 69 y.o. male who presents via telehealth conferencing today.  Since last being seen in our clinic, the patient reports doing reasonably well.  He has been in typical atrial flutter since 01/24/20.  He reports fatigue and decreased exercise tolerance.  Today, he denies symptoms of palpitations, chest pain, shortness of breath,  lower extremity edema,, presyncope, or syncope.  + dizziness.  The patient is otherwise without complaint today.     Past Medical History:  Diagnosis Date  . Aortic stenosis   . Benign prostatic hyperplasia   . Bronchitis   . Cataract    bilateral   . CHF (congestive heart failure) ([place])   . Colon polyps   . Complete heart block ([place]) 07/28/2019  . Heart murmur   . History of fracture of vertebral column   . Hyperlipidemia   . Kidney stone   . Nonrheumatic aortic valve stenosis   . Osteoporosis   . Osteoporosis   . Pure hypercholesterolemia   . RBBB   . 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 Sensorineural hearing loss of both ears   . Thrombosed hemorrhoids    hsitory of  . Tubular adenoma of colon   . Vitamin D deficiency       Past Surgical History:  Procedure Laterality Date  . AORTIC VALVE REPLACEMENT N/A 07/23/2019   Procedure: MINIMALLY INVASIVE AORTIC VALVE REPLACEMENT (AVR) using Margaretha Sheffield Resilia 21 MM Aortic Valve.;  Surgeon: Rexene Alberts, MD;  Location: [place];  Service: Open Heart Surgery;  Laterality: N/A;  . CARDIAC CATHETERIZATION    . COLON SURGERY  2006   1 foot ascending colon removed   . COLONOSCOPY    . HERNIA REPAIR    . PACEMAKER IMPLANT N/A 07/29/2019   Procedure: PACEMAKER IMPLANT;  Surgeon: [name], MD;  Location: [place] [facility];  Service: Cardiovascular;  Laterality: N/A;  . POLYPECTOMY    . RIGHT HEART CATH AND CORONARY ANGIOGRAPHY N/A 06/08/2019   Procedure: RIGHT HEART CATH AND CORONARY ANGIOGRAPHY;  Surgeon: Sherren Mocha, MD;  Location: [place];  Service: Cardiovascular;  Laterality: N/A;  . TEE WITHOUT CARDIOVERSION N/A 07/23/2019   Procedure: TRANSESOPHAGEAL ECHOCARDIOGRAM (TEE);  Surgeon: Rexene Alberts, MD;  Location: [facility];  Service: Open Heart Surgery;  Laterality: N/A;  . TESTICLE SURGERY      Current Outpatient Medications  Medication Sig Dispense Refill  . acetaminophen (TYLENOL) 500 MG tablet Take 500 mg by mouth every 8 (eight) hours as needed for moderate pain or headache.     [name] amoxicillin (AMOXIL) 500 MG capsule as directed. For dental appts    . apixaban (ELIQUIS)  5 MG TABS tablet Take 1 tablet (5 mg total) by mouth 2 (two) times daily. 60 tablet 3  . aspirin 81 MG EC tablet Take 1 tablet (81 mg total) by mouth daily. 30 tablet   . Calcium Citrate-Vitamin D (CITRACAL + D PO) Take 2 tablets by mouth in the morning and at bedtime.    . Cholecalciferol (DIALYVITE VITAMIN D 5000) 125 MCG (5000 UT) capsule Take 5,000 Units by mouth daily.    . colchicine 0.6 MG tablet Take 1 tablet (0.6 mg total) by mouth 2 (two) times daily. 180 tablet 0  . ibuprofen (ADVIL) 200 MG tablet Take 400 mg by mouth every 6 (six) hours as needed for moderate  pain.     . metoprolol tartrate (LOPRESSOR) 25 MG tablet Take 1 tablet (25 mg total) by mouth 2 (two) times daily. 180 tablet 3  . Omega-3 1400 MG CAPS Take 1,400 mg by mouth daily.     . pravastatin (PRAVACHOL) 40 MG tablet Take 40 mg by mouth daily.      No current facility-administered medications for this visit.    Allergies:   Terbinafine hcl   Social History:  The patient  reports that he has never smoked. He has never used smokeless tobacco. He reports current alcohol use. He reports that he does not use drugs.   ROS:  Please see the history of present illness.   All other systems are personally reviewed and negative.    Exam:    Vital Signs:  Ht 5' 9.5" (1.765 m)   Wt 165 lb (74.8 kg)   BMI 24.02 kg/m   Well sounding, alert and conversant   Labs/Other Tests and Data Reviewed:    Recent Labs: 07/31/2019: Magnesium 1.9 09/07/2019: ALT 27 12/24/2019: BUN 19; Creatinine, Ser 0.85; Hemoglobin 16.3; Platelets 162; Potassium 4.2; Sodium 137; TSH 1.150   Wt Readings from Last 3 Encounters:  02/21/20 165 lb (74.8 kg)  02/11/20 167 lb 6.4 oz (75.9 kg)  12/24/19 161 lb (73 kg)     Last device remote is reviewed from [facility] [employer] which reveals normal device function, persistent atrial flutter since 01/24/20   ASSESSMENT & PLAN:    1.  Persistent afib/ typical atrial flutter The patient has symptomatic, recurrent atrial fibrillation and atrial flutter.  Chads2vasc score is 2.  he is anticoagulated with eliquis . Therapeutic strategies for afib including medicine and ablation were discussed in detail with the patient today. Risk, benefits, and alternatives to EP study and radiofrequency ablation for afib were also discussed in detail today. These risks include but are not limited to stroke, bleeding, vascular damage, tamponade, perforation, damage to the esophagus, lungs, and other structures, pulmonary vein stenosis, worsening renal function, pacemaker lead dislodgement, and death.  The patient understands these risk and wishes to proceed.  We will therefore proceed with catheter ablation at the next available time.  Carto, ICE, anesthesia are requested for the procedure.  Will also obtain cardiac CT prior to the procedure to exclude LAA thrombus and further evaluate atrial anatomy.  Stop ASA at this time.  Risks, benefits and potential toxicities for medications prescribed and/or refilled reviewed with patient today.   2. AV block Device remote reviewed   Patient Risk:  after full review of this patients clinical status, I feel that they are at moderate risk at this time.  Today, I have spent 15 minutes with the patient with telehealth technology discussing arrhythmia management .    Signed, [name],  MD  02/21/2020 9:22 AM     [facility] [address] [place] [address] [phone] (office) [phone] (fax)

## 2020-02-21 NOTE — Telephone Encounter (Signed)
-----   Message from [name], MD sent at 02/21/2020  9:33 AM EST ----- Afib ablation  [facility] Cardiac CT

## 2020-02-21 NOTE — H&P (View-Only) (Signed)
Electrophysiology TeleHealth Note  Due to national recommendations of social distancing due to [place] 19, an audio telehealth visit is felt to be most appropriate for this patient at this time.  Verbal consent was obtained by me for the telehealth visit today.  The patient does not have capability for a virtual visit.  A phone visit is therefore required today.   Date:  02/21/2020   ID:  Jerry Foster, DOB 17-Jan-1951, MRN [id number]  Location: patient's home  Provider location:  [place]  Evaluation Performed: Follow-up visit  PCP:  Jefm Petty, MD   Electrophysiologist:  Dr Rayann Heman  Chief Complaint:  palpitations  History of Present Illness:    Jerry Foster is a 69 y.o. male who presents via telehealth conferencing today.  Since last being seen in our clinic, the patient reports doing reasonably well.  He has been in typical atrial flutter since 01/24/20.  He reports fatigue and decreased exercise tolerance.  Today, he denies symptoms of palpitations, chest pain, shortness of breath,  lower extremity edema,, presyncope, or syncope.  + dizziness.  The patient is otherwise without complaint today.     Past Medical History:  Diagnosis Date  . Aortic stenosis   . Benign prostatic hyperplasia   . Bronchitis   . Cataract    bilateral   . CHF (congestive heart failure) ([place])   . Colon polyps   . Complete heart block ([place]) 07/28/2019  . Heart murmur   . History of fracture of vertebral column   . Hyperlipidemia   . Kidney stone   . Nonrheumatic aortic valve stenosis   . Osteoporosis   . Osteoporosis   . Pure hypercholesterolemia   . RBBB   . 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 Sensorineural hearing loss of both ears   . Thrombosed hemorrhoids    hsitory of  . Tubular adenoma of colon   . Vitamin D deficiency      Past Surgical History:  Procedure Laterality Date  . AORTIC VALVE REPLACEMENT N/A 07/23/2019   Procedure: MINIMALLY INVASIVE AORTIC VALVE REPLACEMENT (AVR) using Margaretha Sheffield Resilia 21 MM Aortic Valve.;  Surgeon: Rexene Alberts, MD;  Location: [facility];  Service: Open Heart Surgery;  Laterality: N/A;  . CARDIAC CATHETERIZATION    . COLON SURGERY  2006   1 foot ascending colon removed   . COLONOSCOPY    . HERNIA REPAIR    . PACEMAKER IMPLANT N/A 07/29/2019   Procedure: PACEMAKER IMPLANT;  Surgeon: [name], MD;  Location: [facility];  Service: Cardiovascular;  Laterality: N/A;  . POLYPECTOMY    . RIGHT HEART CATH AND CORONARY ANGIOGRAPHY N/A 06/08/2019   Procedure: RIGHT HEART CATH AND CORONARY ANGIOGRAPHY;  Surgeon: Sherren Mocha, MD;  Location: [facility];  Service: Cardiovascular;  Laterality: N/A;  . TEE WITHOUT CARDIOVERSION N/A 07/23/2019   Procedure: TRANSESOPHAGEAL ECHOCARDIOGRAM (TEE);  Surgeon: Rexene Alberts, MD;  Location: [facility];  Service: Open Heart Surgery;  Laterality: N/A;  . TESTICLE SURGERY      Current Outpatient Medications  Medication Sig Dispense Refill  . acetaminophen (TYLENOL) 500 MG tablet Take 500 mg by mouth every 8 (eight) hours as needed for moderate pain or headache.     [name] amoxicillin (AMOXIL) 500 MG capsule as directed. For dental appts    . apixaban (ELIQUIS) 5  MG TABS tablet Take 1 tablet (5 mg total) by mouth 2 (two) times daily. 60 tablet 3  . aspirin 81 MG EC tablet Take 1 tablet (81 mg total) by mouth daily. 30 tablet   . Calcium Citrate-Vitamin D (CITRACAL + D PO) Take 2 tablets by mouth in the morning and at bedtime.    . Cholecalciferol (DIALYVITE VITAMIN D 5000) 125 MCG (5000 UT) capsule Take 5,000 Units by mouth daily.    . colchicine 0.6 MG tablet Take 1 tablet (0.6 mg total) by mouth 2 (two) times daily. 180 tablet 0  . ibuprofen (ADVIL) 200 MG tablet Take 400 mg by mouth every 6 (six) hours as needed for moderate  pain.     . metoprolol tartrate (LOPRESSOR) 25 MG tablet Take 1 tablet (25 mg total) by mouth 2 (two) times daily. 180 tablet 3  . Omega-3 1400 MG CAPS Take 1,400 mg by mouth daily.     . pravastatin (PRAVACHOL) 40 MG tablet Take 40 mg by mouth daily.      No current facility-administered medications for this visit.    Allergies:   Terbinafine hcl   Social History:  The patient  reports that he has never smoked. He has never used smokeless tobacco. He reports current alcohol use. He reports that he does not use drugs.   ROS:  Please see the history of present illness.   All other systems are personally reviewed and negative.    Exam:    Vital Signs:  Ht 5' 9.5" (1.765 m)   Wt 165 lb (74.8 kg)   BMI 24.02 kg/m   Well sounding, alert and conversant   Labs/Other Tests and Data Reviewed:    Recent Labs: 07/31/2019: Magnesium 1.9 09/07/2019: ALT 27 12/24/2019: BUN 19; Creatinine, Ser 0.85; Hemoglobin 16.3; Platelets 162; Potassium 4.2; Sodium 137; TSH 1.150   Wt Readings from Last 3 Encounters:  02/21/20 165 lb (74.8 kg)  02/11/20 167 lb 6.4 oz (75.9 kg)  12/24/19 161 lb (73 kg)     Last device remote is reviewed from [facility] PDF which reveals normal device function, persistent atrial flutter since 01/24/20   ASSESSMENT & PLAN:    1.  Persistent afib/ typical atrial flutter The patient has symptomatic, recurrent atrial fibrillation and atrial flutter.  Chads2vasc score is 2.  he is anticoagulated with eliquis . Therapeutic strategies for afib including medicine and ablation were discussed in detail with the patient today. Risk, benefits, and alternatives to EP study and radiofrequency ablation for afib were also discussed in detail today. These risks include but are not limited to stroke, bleeding, vascular damage, tamponade, perforation, damage to the esophagus, lungs, and other structures, pulmonary vein stenosis, worsening renal function, pacemaker lead dislodgement, and death.  The patient understands these risk and wishes to proceed.  We will therefore proceed with catheter ablation at the next available time.  Carto, ICE, anesthesia are requested for the procedure.  Will also obtain cardiac CT prior to the procedure to exclude LAA thrombus and further evaluate atrial anatomy.  Stop ASA at this time.  Risks, benefits and potential toxicities for medications prescribed and/or refilled reviewed with patient today.   2. AV block Device remote reviewed   Patient Risk:  after full review of this patients clinical status, I feel that they are at moderate risk at this time.  Today, I have spent 15 minutes with the patient with telehealth technology discussing arrhythmia management .    Signed, [name], MD  02/21/2020 9:22 AM     [facility] [address] [place] [address] [phone] (office) [phone] (fax)

## 2020-02-21 NOTE — Telephone Encounter (Signed)
Set up ablation, labs, and covid screening. Advised that instructions will come through mychart in the menu tap under letters.    Patient verbalized understanding.

## 2020-02-22 ENCOUNTER — Other Ambulatory Visit: Payer: Medicare Other | Admitting: *Deleted

## 2020-02-22 ENCOUNTER — Other Ambulatory Visit: Payer: Self-pay

## 2020-02-22 DIAGNOSIS — I4891 Unspecified atrial fibrillation: Secondary | ICD-10-CM

## 2020-02-23 LAB — BASIC METABOLIC PANEL
BUN/Creatinine Ratio: 23 (ref 10–24)
BUN: 21 mg/dL (ref 8–27)
CO2: 23 mmol/L (ref 20–29)
Calcium: 9.5 mg/dL (ref 8.6–10.2)
Chloride: 102 mmol/L (ref 96–106)
Creatinine, Ser: 0.9 mg/dL (ref 0.76–1.27)
GFR calc Af Amer: 99 mL/min/{1.73_m2} (ref >59)
GFR calc non Af Amer: 86 mL/min/{1.73_m2} (ref >59)
Glucose: 99 mg/dL (ref 65–99)
Potassium: 4.2 mmol/L (ref 3.5–5.2)
Sodium: 139 mmol/L (ref 134–144)

## 2020-02-23 LAB — CBC WITH DIFFERENTIAL/PLATELET
Basophils Absolute: 0 10*3/uL (ref 0.0–0.2)
Basos: 1 %
EOS (ABSOLUTE): 0.2 10*3/uL (ref 0.0–0.4)
Eos: 3 %
Hematocrit: 44.6 % (ref 37.5–51.0)
Hemoglobin: 15.4 g/dL (ref 13.0–17.7)
Immature Grans (Abs): 0 10*3/uL (ref 0.0–0.1)
Immature Granulocytes: 0 %
Lymphocytes Absolute: 2.3 10*3/uL (ref 0.7–3.1)
Lymphs: 30 %
MCH: 31 pg (ref 26.6–33.0)
MCHC: 34.5 g/dL (ref 31.5–35.7)
MCV: 90 fL (ref 79–97)
Monocytes Absolute: 0.6 10*3/uL (ref 0.1–0.9)
Monocytes: 8 %
Neutrophils Absolute: 4.6 10*3/uL (ref 1.4–7.0)
Neutrophils: 58 %
Platelets: 140 10*3/uL — ABNORMAL LOW (ref 150–450)
RBC: 4.97 x10E6/uL (ref 4.14–5.80)
RDW: 13.1 % (ref 11.6–15.4)
WBC: 7.7 10*3/uL (ref 3.4–10.8)

## 2020-02-24 ENCOUNTER — Encounter: Payer: Self-pay | Admitting: *Deleted

## 2020-03-07 ENCOUNTER — Telehealth (HOSPITAL_COMMUNITY): Payer: Self-pay | Admitting: Emergency Medicine

## 2020-03-07 NOTE — Telephone Encounter (Signed)
Reaching out to patient to offer assistance regarding upcoming cardiac imaging study; pt verbalizes understanding of appt date/time, parking situation and where to check in, pre-test NPO status and medications ordered, and verified current allergies; name and call back number provided for further questions should they arise Marchia Bond RN Navigator Cardiac Imaging [facility] [phone] office [phone] cell  Pt instructed to take daily metoprolol 2 hr prior to scan  Clarise Cruz

## 2020-03-09 ENCOUNTER — Ambulatory Visit (HOSPITAL_COMMUNITY)
Admission: RE | Admit: 2020-03-09 | Discharge: 2020-03-09 | Disposition: A | Discharge status: Home / Self Care | Payer: Medicare Other | Source: Ambulatory Visit | Attending: Internal Medicine | Admitting: Internal Medicine

## 2020-03-09 ENCOUNTER — Telehealth: Payer: Self-pay | Admitting: Internal Medicine

## 2020-03-09 ENCOUNTER — Encounter: Payer: Self-pay | Admitting: *Deleted

## 2020-03-09 ENCOUNTER — Other Ambulatory Visit: Payer: Self-pay

## 2020-03-09 DIAGNOSIS — I4891 Unspecified atrial fibrillation: Secondary | ICD-10-CM | POA: Insufficient documentation

## 2020-03-09 MED ORDER — IOHEXOL 350 MG/ML SOLN
80.0000 mL | Freq: Once | INTRAVENOUS | Status: AC | PRN
  Administered 2020-03-09: 09:00:00 80 mL via INTRAVENOUS

## 2020-03-09 NOTE — Telephone Encounter (Signed)
Advised patient it is probably safer to stay closer to provider so if there is complications he will be close to a provider that knows him. Wouldn't recommend traveling far if he does travel. Advised someone else to drive and to get out and strech his legs every hour to prevent a blood clot.   Patient verbalized understanding.

## 2020-03-09 NOTE — Telephone Encounter (Signed)
Advised that I sent him a letter with mychart for after ablation instructions and to mychart or call if he has any further questions.  Agreeable with plan and verbalized understanding.

## 2020-03-09 NOTE — Telephone Encounter (Signed)
Called patient and gave him directions on how to get to his letter in [place].   Verbalized understanding

## 2020-03-09 NOTE — Telephone Encounter (Signed)
New message:     Patient calling stating that he need to instructions for aftercare of CATH

## 2020-03-10 ENCOUNTER — Telehealth: Payer: Self-pay | Admitting: Internal Medicine

## 2020-03-10 ENCOUNTER — Other Ambulatory Visit: Payer: Self-pay | Admitting: Physician Assistant

## 2020-03-10 NOTE — Telephone Encounter (Signed)
Spoke with the pt and we went over the ablation instructions once again and I resent his Letter to him for his review.

## 2020-03-10 NOTE — Telephone Encounter (Signed)
    Pt calling, he would like to get details for his upcoming appt. He would like to know what time he needs to be at the hospital and what meds he needs to hold prior his procedure

## 2020-03-13 ENCOUNTER — Other Ambulatory Visit (HOSPITAL_COMMUNITY)
Admission: RE | Admit: 2020-03-13 | Discharge: 2020-03-13 | Disposition: A | Discharge status: Home / Self Care | Payer: Medicare Other | Source: Ambulatory Visit | Attending: Internal Medicine | Admitting: Internal Medicine

## 2020-03-13 DIAGNOSIS — Z20822 Contact with and (suspected) exposure to covid-19: Secondary | ICD-10-CM | POA: Insufficient documentation

## 2020-03-13 DIAGNOSIS — Z01812 Encounter for preprocedural laboratory examination: Secondary | ICD-10-CM | POA: Diagnosis present

## 2020-03-13 LAB — SARS CORONAVIRUS 2 (TAT 6-24 HRS): SARS Coronavirus 2: NEGATIVE

## 2020-03-14 NOTE — Progress Notes (Signed)
Instructed patient on the following items: Arrival time 0800 Nothing to eat or drink after midnight No meds AM of procedure Responsible person to drive you home and stay with you for 24 hrs  Have you missed any doses of anti-coagulant Eliquis- missed last nights dose, will notify Dr Rayann Heman. Ok to proceed.

## 2020-03-15 ENCOUNTER — Other Ambulatory Visit: Payer: Self-pay

## 2020-03-15 ENCOUNTER — Encounter (HOSPITAL_COMMUNITY)
Admission: RE | Disposition: A | Discharge status: Home / Self Care | Payer: Self-pay | Source: Home / Self Care | Attending: Internal Medicine

## 2020-03-15 ENCOUNTER — Ambulatory Visit (HOSPITAL_COMMUNITY): Payer: Medicare Other | Admitting: Anesthesiology

## 2020-03-15 ENCOUNTER — Ambulatory Visit (HOSPITAL_COMMUNITY)
Admission: RE | Admit: 2020-03-15 | Discharge: 2020-03-15 | Disposition: A | Discharge status: Home / Self Care | Payer: Medicare Other | Attending: Internal Medicine | Admitting: Internal Medicine

## 2020-03-15 ENCOUNTER — Encounter (HOSPITAL_COMMUNITY): Payer: Self-pay | Admitting: Internal Medicine

## 2020-03-15 DIAGNOSIS — Z950 Presence of cardiac pacemaker: Secondary | ICD-10-CM | POA: Insufficient documentation

## 2020-03-15 DIAGNOSIS — Z953 Presence of xenogenic heart valve: Secondary | ICD-10-CM | POA: Insufficient documentation

## 2020-03-15 DIAGNOSIS — I480 Paroxysmal atrial fibrillation: Secondary | ICD-10-CM | POA: Diagnosis not present

## 2020-03-15 DIAGNOSIS — I442 Atrioventricular block, complete: Secondary | ICD-10-CM | POA: Diagnosis not present

## 2020-03-15 DIAGNOSIS — Z888 Allergy status to other drugs, medicaments and biological substances status: Secondary | ICD-10-CM | POA: Insufficient documentation

## 2020-03-15 DIAGNOSIS — Z79899 Other long term (current) drug therapy: Secondary | ICD-10-CM | POA: Diagnosis not present

## 2020-03-15 DIAGNOSIS — Z7982 Long term (current) use of aspirin: Secondary | ICD-10-CM | POA: Insufficient documentation

## 2020-03-15 DIAGNOSIS — Z7901 Long term (current) use of anticoagulants: Secondary | ICD-10-CM | POA: Insufficient documentation

## 2020-03-15 DIAGNOSIS — I4819 Other persistent atrial fibrillation: Secondary | ICD-10-CM | POA: Diagnosis present

## 2020-03-15 DIAGNOSIS — I483 Typical atrial flutter: Secondary | ICD-10-CM | POA: Insufficient documentation

## 2020-03-15 HISTORY — PX: ATRIAL FIBRILLATION ABLATION: EP1191

## 2020-03-15 LAB — POCT ACTIVATED CLOTTING TIME
Activated Clotting Time: 285 seconds
Activated Clotting Time: 321 seconds

## 2020-03-15 SURGERY — ATRIAL FIBRILLATION ABLATION
Anesthesia: General

## 2020-03-15 MED ORDER — DEXAMETHASONE SODIUM PHOSPHATE 10 MG/ML IJ SOLN
INTRAMUSCULAR | Status: DC | PRN
  Administered 2020-03-15: 8 mg via INTRAVENOUS

## 2020-03-15 MED ORDER — HEPARIN SODIUM (PORCINE) 1000 UNIT/ML IJ SOLN
INTRAMUSCULAR | Status: DC | PRN
  Administered 2020-03-15: 14000 [IU] via INTRAVENOUS
  Administered 2020-03-15: 1000 [IU] via INTRAVENOUS

## 2020-03-15 MED ORDER — HEPARIN (PORCINE) IN NACL 1000-0.9 UT/500ML-% IV SOLN
INTRAVENOUS | Status: AC
  Filled 2020-03-15: qty 500

## 2020-03-15 MED ORDER — ROCURONIUM BROMIDE 10 MG/ML (PF) SYRINGE
PREFILLED_SYRINGE | INTRAVENOUS | Status: DC | PRN
  Administered 2020-03-15: 70 mg via INTRAVENOUS

## 2020-03-15 MED ORDER — SODIUM CHLORIDE 0.9% FLUSH: 3.0000 mL | Freq: Two times a day (BID) | INTRAVENOUS | Status: DC

## 2020-03-15 MED ORDER — PROPOFOL 10 MG/ML IV BOLUS
INTRAVENOUS | Status: DC | PRN
  Administered 2020-03-15: 100 mg via INTRAVENOUS
  Administered 2020-03-15: 30 mg via INTRAVENOUS

## 2020-03-15 MED ORDER — ONDANSETRON HCL 4 MG/2ML IJ SOLN
INTRAMUSCULAR | Status: DC | PRN
  Administered 2020-03-15: 4 mg via INTRAVENOUS

## 2020-03-15 MED ORDER — MIDAZOLAM HCL 2 MG/2ML IJ SOLN
INTRAMUSCULAR | Status: DC | PRN
  Administered 2020-03-15: 2 mg via INTRAVENOUS

## 2020-03-15 MED ORDER — PANTOPRAZOLE SODIUM 40 MG PO TBEC: 40.0000 mg | DELAYED_RELEASE_TABLET | Freq: Every day | ORAL | 3 refills | Status: DC

## 2020-03-15 MED ORDER — SODIUM CHLORIDE 0.9 % IV SOLN: 250.0000 mL | INTRAVENOUS | Status: DC | PRN

## 2020-03-15 MED ORDER — SUGAMMADEX SODIUM 200 MG/2ML IV SOLN
INTRAVENOUS | Status: DC | PRN
  Administered 2020-03-15: 200 mg via INTRAVENOUS

## 2020-03-15 MED ORDER — PHENYLEPHRINE HCL-NACL 10-0.9 MG/250ML-% IV SOLN
INTRAVENOUS | Status: DC | PRN
  Administered 2020-03-15: 25 ug/min via INTRAVENOUS

## 2020-03-15 MED ORDER — HEPARIN SODIUM (PORCINE) 1000 UNIT/ML IJ SOLN
INTRAMUSCULAR | Status: AC
  Filled 2020-03-15: qty 2

## 2020-03-15 MED ORDER — HEPARIN SODIUM (PORCINE) 1000 UNIT/ML IJ SOLN
INTRAMUSCULAR | Status: DC | PRN
  Administered 2020-03-15: 2000 [IU] via INTRAVENOUS
  Administered 2020-03-15: 4000 [IU] via INTRAVENOUS

## 2020-03-15 MED ORDER — ONDANSETRON HCL 4 MG/2ML IJ SOLN: 4.0000 mg | Freq: Four times a day (QID) | INTRAMUSCULAR | Status: DC | PRN

## 2020-03-15 MED ORDER — APIXABAN 5 MG PO TABS
5.0000 mg | ORAL_TABLET | Freq: Once | ORAL | Status: AC
  Administered 2020-03-15: 13:00:00 5 mg via ORAL
  Filled 2020-03-15: qty 1

## 2020-03-15 MED ORDER — SODIUM CHLORIDE 0.9% FLUSH: 3.0000 mL | INTRAVENOUS | Status: DC | PRN

## 2020-03-15 MED ORDER — PANTOPRAZOLE SODIUM 40 MG PO TBEC: 40.0000 mg | DELAYED_RELEASE_TABLET | Freq: Every day | ORAL | 0 refills | Status: DC

## 2020-03-15 MED ORDER — FENTANYL CITRATE (PF) 250 MCG/5ML IJ SOLN
INTRAMUSCULAR | Status: DC | PRN
  Administered 2020-03-15: 50 ug via INTRAVENOUS

## 2020-03-15 MED ORDER — LIDOCAINE 2% (20 MG/ML) 5 ML SYRINGE
INTRAMUSCULAR | Status: DC | PRN
  Administered 2020-03-15: 40 mg via INTRAVENOUS

## 2020-03-15 MED ORDER — HYDROCODONE-ACETAMINOPHEN 5-325 MG PO TABS: 1.0000 | ORAL_TABLET | ORAL | Status: DC | PRN

## 2020-03-15 MED ORDER — SODIUM CHLORIDE 0.9 % IV SOLN: INTRAVENOUS | Status: DC

## 2020-03-15 MED ORDER — PHENYLEPHRINE 40 MCG/ML (10ML) SYRINGE FOR IV PUSH (FOR BLOOD PRESSURE SUPPORT)
PREFILLED_SYRINGE | INTRAVENOUS | Status: DC | PRN
  Administered 2020-03-15: 80 ug via INTRAVENOUS
  Administered 2020-03-15: 120 ug via INTRAVENOUS
  Administered 2020-03-15: 80 ug via INTRAVENOUS
  Administered 2020-03-15: 120 ug via INTRAVENOUS

## 2020-03-15 MED ORDER — ACETAMINOPHEN 325 MG PO TABS: 650.0000 mg | ORAL_TABLET | ORAL | Status: DC | PRN

## 2020-03-15 MED ORDER — PROTAMINE SULFATE 10 MG/ML IV SOLN
INTRAVENOUS | Status: DC | PRN
  Administered 2020-03-15: 40 mg via INTRAVENOUS

## 2020-03-15 MED ORDER — HEPARIN (PORCINE) IN NACL 1000-0.9 UT/500ML-% IV SOLN
INTRAVENOUS | Status: DC | PRN
  Administered 2020-03-15: 500 mL

## 2020-03-15 SURGICAL SUPPLY — 19 items
BLANKET WARM UNDERBOD FULL ACC (MISCELLANEOUS) ×3 IMPLANT
CATH 8FR REPROCESSED SOUNDSTAR (CATHETERS) ×3 IMPLANT
CATH MAPPNG PENTARAY F 2-6-2MM (CATHETERS) ×1 IMPLANT
CATH SMTCH THERMOCOOL SF DF (CATHETERS) ×3 IMPLANT
CATH SOUNDSTAR ECO 8FR (CATHETERS) ×3 IMPLANT
CATH WEB BI DIR CSDF CRV REPRO (CATHETERS) ×3 IMPLANT
CLOSURE PERCLOSE PROSTYLE (VASCULAR PRODUCTS) ×9 IMPLANT
COVER SWIFTLINK CONNECTOR (BAG) ×3 IMPLANT
INTRODUCER SWARTZ SL2 8F (SHEATH) ×3 IMPLANT
NEEDLE BAYLIS TRANSSEPTAL 71CM (NEEDLE) ×3 IMPLANT
PACK EP LATEX FREE (CUSTOM PROCEDURE TRAY) ×2
PACK EP LF (CUSTOM PROCEDURE TRAY) ×1 IMPLANT
PAD PRO RADIOLUCENT 2001M-C (PAD) ×3 IMPLANT
PATCH CARTO3 (PAD) ×3 IMPLANT
PENTARAY F 2-6-2MM (CATHETERS) ×3
SHEATH PINNACLE 7F 10CM (SHEATH) ×6 IMPLANT
SHEATH PINNACLE 9F 10CM (SHEATH) ×3 IMPLANT
SHEATH PROBE COVER 6X72 (BAG) ×3 IMPLANT
TUBING SMART ABLATE COOLFLOW (TUBING) ×3 IMPLANT

## 2020-03-15 NOTE — Interval H&P Note (Signed)
History and Physical Interval Note:  03/15/2020 9:33 AM  Jerry Foster  has presented today for surgery, with the diagnosis of afib.  The various methods of treatment have been discussed with the patient and family. After consideration of risks, benefits and other options for treatment, the patient has consented to  Procedure(s): ATRIAL FIBRILLATION ABLATION (N/A) and atrial flutter ablation as a surgical intervention.  The patient's history has been reviewed, patient examined, no change in status, stable for surgery.  I have reviewed the patient's chart and labs.  Questions were answered to the patient's satisfaction.    Cardiac CT reviewed at length He reports compliance with eliquis.   [name]

## 2020-03-15 NOTE — Anesthesia Preprocedure Evaluation (Addendum)
Anesthesia Evaluation  Patient identified by MRN, date of birth, ID band Patient awake    Reviewed: Allergy & Precautions, NPO status , Patient's Chart, lab work & pertinent test results, reviewed documented beta blocker date and time   Airway Mallampati: II  TM Distance: >3 FB Neck ROM: Full    Dental  (+) Teeth Intact, Dental Advisory Given   Pulmonary neg pulmonary ROS,    Pulmonary exam normal breath sounds clear to auscultation       Cardiovascular +CHF  Normal cardiovascular exam+ dysrhythmias (on eliquis, last dose yesterday PM) Atrial Fibrillation + pacemaker + Valvular Problems/Murmurs (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 Left ventricular ejection fraction, by estimation, is 60 to 65%. The  left ventricle has normal function. The left ventricle has no regional  wall motion abnormalities. There is moderate left ventricular hypertrophy.  Left ventricular diastolic  parameters are consistent with Grade I diastolic dysfunction (impaired  relaxation).  2. Right ventricular systolic function is normal. The right ventricular  size is normal.  3. The aortic valve has been repaired/replaced. There is a 21 mm Edwards  Inspiris Resilia valve present in the aortic position. Echo findings are  consistent with normal structure and function of the aortic valve  prosthesis. Aortic valve area, by VTI  measures 1.23 cm. Aortic valve mean gradient measures 15.0 mmHg. Aortic  valve Vmax measures 2.71 m/s. Peak gradient 29 mmHg. DI 0.36.  4. The inferior vena cava is dilated in size with >50% respiratory  variability, suggesting right atrial pressure of 8 mmHg.   LHC 2021 1. Mild nonobstructive CAD without any high-grade or flow-limiting stenoses 2. Bulky aortic valve calcification with restricted leaflet mobility 3. Normal right heart pressures/preserved cardiac output  Plan: CTA studies, cardiac surgical  evaluation for consideration of aortic valve replacement  EP/PPM 07/2019 CONCLUSIONS:   1. 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 Successful cardioversion of afib to sinus rhythm,  The patient returned to atrial flutter after about 5 minutes.  No early apparent complications.     Neuro/Psych negative neurological ROS  negative psych ROS   GI/Hepatic negative GI ROS, Neg liver ROS,   Endo/Other  negative endocrine ROS  Renal/GU negative Renal ROS  negative genitourinary   Musculoskeletal negative musculoskeletal ROS (+)   Abdominal   Peds  Hematology negative hematology ROS (+)   Anesthesia Other Findings   Reproductive/Obstetrics                            Anesthesia Physical Anesthesia Plan  ASA: III  Anesthesia Plan: General   Post-op Pain Management:    Induction: Intravenous  PONV Risk Score and Plan: 2 and Midazolam, Dexamethasone and Ondansetron  Airway Management Planned: Oral ETT  Additional Equipment:   Intra-op Plan:   Post-operative Plan: Extubation in OR  Informed Consent: I have reviewed the patients History and Physical, chart, labs and discussed the procedure including the risks, benefits and alternatives for the proposed anesthesia with the patient or authorized representative who has indicated his/her understanding and acceptance.     Dental advisory given  Plan Discussed with: CRNA  Anesthesia Plan Comments:         Anesthesia Quick Evaluation

## 2020-03-15 NOTE — Progress Notes (Signed)
Patient was given discharge instructions. He verbalized understanding. 

## 2020-03-15 NOTE — Discharge Instructions (Signed)
Post procedure care instructions No driving for 4 days. No lifting over 5 lbs for 1 week. No vigorous or sexual activity for 1 week. You may return to work/your usual activities on 03/22/2020. Keep procedure site clean & dry. If you notice increased pain, swelling, bleeding or pus, call/return!  You may shower after 24 hours, but no soaking in baths/hot tubs/pools for 1 week.     You have an appointment set up with the [facility].  Multiple studies have shown that being followed by a dedicated atrial fibrillation clinic in addition to the standard care you receive from your other physicians improves health. We believe that enrollment in the atrial fibrillation clinic will allow [place] to better care for you.   The phone number to the [facility] is [phone]. The clinic is staffed Monday through Friday from 8:30am to 5pm.  Parking Directions: The clinic is located in the Heart and Vascular Building connected to [facility]. 1)From [address] turn on to [address] and go to the 3rd entrance  (Heart and Vascular entrance) on the right. 2)Look to the right for Heart &Vascular Parking Garage. 3)A code for the entrance is required, for January is 1111.   4)Take the elevators to the 1st floor. Registration is in the room with the glass walls at the end of the hallway.  If you have any trouble parking or locating the clinic, please don't hesitate to call [phone].   Cardiac Ablation, Care After  This sheet gives you information about how to care for yourself after your procedure. Your health care provider may also give you more specific instructions. If you have problems or questions, contact your health care provider. What can I expect after the procedure? After the procedure, it is common to have:  Bruising around your puncture site.  Tenderness around your puncture site.  Skipped heartbeats.  Tiredness (fatigue).  Follow these  instructions at home: Puncture site care   Follow instructions from your health care provider about how to take care of your puncture site. Make sure you: ? If present, leave stitches (sutures), skin glue, or adhesive strips in place. These skin closures may need to stay in place for up to 2 weeks. If adhesive strip edges start to loosen and curl up, you may trim the loose edges. Do not remove adhesive strips completely unless your health care provider tells you to do that. ? If a large square bandage is present, this may be removed 24 hours after surgery.   Check your puncture site every day for signs of infection. Check for: ? Redness, swelling, or pain. ? Fluid or blood. If your puncture site starts to bleed, lie down on your back, apply firm pressure to the area, and contact your health care provider. ? Warmth. ? Pus or a bad smell. Driving  Do not drive for at least 4 days after your procedure or however long your health care provider recommends. (Do not resume driving if you have previously been instructed not to drive for other health reasons.)  Do not drive or use heavy machinery while taking prescription pain medicine. Activity  Avoid activities that take a lot of effort for at least 7 days after your procedure.  Do not lift anything that is heavier than 5 lb (4.5 kg) for one week.   No sexual activity for 1 week.   Return to your normal activities as told by your health care provider. Ask your health care provider what  activities are safe for you. General instructions  Take over-the-counter and prescription medicines only as told by your health care provider.  Do not use any products that contain nicotine or tobacco, such as cigarettes and e-cigarettes. If you need help quitting, ask your health care provider.  You may shower after 24 hours, but Do not take baths, swim, or use a hot tub for 1 week.   Do not drink alcohol for 24 hours after your procedure.  Keep all  follow-up visits as told by your health care provider. This is important. Contact a health care provider if:  You have redness, mild swelling, or pain around your puncture site.  You have fluid or blood coming from your puncture site that stops after applying firm pressure to the area.  Your puncture site feels warm to the touch.  You have pus or a bad smell coming from your puncture site.  You have a fever.  You have chest pain or discomfort that spreads to your neck, jaw, or arm.  You are sweating a lot.  You feel nauseous.  You have a fast or irregular heartbeat.  You have shortness of breath.  You are dizzy or light-headed and feel the need to lie down.  You have pain or numbness in the arm or leg closest to your puncture site. Get help right away if:  Your puncture site suddenly swells.  Your puncture site is bleeding and the bleeding does not stop after applying firm pressure to the area. These symptoms may represent a serious problem that is an emergency. Do not wait to see if the symptoms will go away. Get medical help right away. Call your local emergency services (911 in the [place] Do not drive yourself to the hospital. Summary  After the procedure, it is normal to have bruising and tenderness at the puncture site in your groin, neck, or forearm.  Check your puncture site every day for signs of infection.  Get help right away if your puncture site is bleeding and the bleeding does not stop after applying firm pressure to the area. This is a medical emergency. This information is not intended to replace advice given to you by your health care provider. Make sure you discuss any questions you have with your health care provider.

## 2020-03-15 NOTE — Anesthesia Procedure Notes (Signed)
Procedure Name: Intubation Date/Time: 03/15/2020 9:54 AM Performed by: Verdie Drown, CRNA Pre-anesthesia Checklist: Patient identified, Emergency Drugs available, Suction available and Patient being monitored Patient Re-evaluated:Patient Re-evaluated prior to induction Oxygen Delivery Method: Circle System Utilized Preoxygenation: Pre-oxygenation with 100% oxygen Induction Type: IV induction Ventilation: Mask ventilation without difficulty Laryngoscope Size: Mac and 4 Grade View: Grade I Tube type: Oral Tube size: 7.5 mm Number of attempts: 1 Airway Equipment and Method: Stylet and Oral airway Placement Confirmation: ETT inserted through vocal cords under direct vision,  positive ETCO2 and breath sounds checked- equal and bilateral Secured at: 22 cm Tube secured with: Tape Dental Injury: Teeth and Oropharynx as per pre-operative assessment

## 2020-03-15 NOTE — Transfer of Care (Signed)
Immediate Anesthesia Transfer of Care Note  Patient: Jerry Foster  Procedure(s) Performed: ATRIAL FIBRILLATION ABLATION (N/A )  Patient Location: PACU and Cath Lab  Anesthesia Type:General  Level of Consciousness: awake, patient cooperative and responds to stimulation  Airway & Oxygen Therapy: Patient Spontanous Breathing and Patient connected to nasal cannula oxygen  Post-op Assessment: Report given to RN and Post -op Vital signs reviewed and stable  Post vital signs: Reviewed and stable  Last Vitals:  Vitals Value Taken Time  BP 101/61 03/15/20 1237  Temp    Pulse 72 03/15/20 1238  Resp 15 03/15/20 1238  SpO2 100 % 03/15/20 1238  Vitals shown include unvalidated device data.  Last Pain:  Vitals:   03/15/20 0824  TempSrc:   PainSc: 0-No pain         Complications: No complications documented.

## 2020-03-16 ENCOUNTER — Encounter (HOSPITAL_COMMUNITY): Payer: Self-pay | Admitting: Internal Medicine

## 2020-03-16 NOTE — Anesthesia Postprocedure Evaluation (Signed)
Anesthesia Post Note  Patient: Jerry Foster  Procedure(s) Performed: ATRIAL FIBRILLATION ABLATION (N/A )     Patient location during evaluation: PACU Anesthesia Type: General Level of consciousness: awake and alert Pain management: pain level controlled Vital Signs Assessment: post-procedure vital signs reviewed and stable Respiratory status: spontaneous breathing, nonlabored ventilation, respiratory function stable and patient connected to nasal cannula oxygen Cardiovascular status: blood pressure returned to baseline and stable Postop Assessment: no apparent nausea or vomiting Anesthetic complications: no   No complications documented.  Last Vitals:  Vitals:   03/15/20 1510 03/15/20 1540  BP: 123/74 122/77  Pulse: 83 79  Resp: 11 10  Temp:    SpO2: 96% 97%    Last Pain:  Vitals:   03/15/20 1319  TempSrc: Temporal  PainSc: 0-No pain                 Khalin Royce L Heiress Williamson

## 2020-03-22 ENCOUNTER — Telehealth: Payer: Self-pay

## 2020-03-22 NOTE — Telephone Encounter (Signed)
Spoke with patient, assisted with manual transmission.    Transmission received. AF <1%.  Short breakthough episodes have occurred, possibly just AT.  Advised this is normal during the healing processs.  The longest episode was 30 seconds.  Pt was  appreciative of the reassurance.

## 2020-03-22 NOTE — Telephone Encounter (Signed)
Called the patient to remove his bandage right away. He said he misunderstood the directions that said to remove the square bandage. The when he looked it was a rectangle so he just left it, "saying that out loud doesn't make much sense, not sure what I was thinking".  Stayed on the phone while the patient removed the bandage to confirm no signs of infection.  Discussed appropriate activity at this stage of the healing process. Patient verbalized understanding.    Will forward to [facility] to assist with transmission.

## 2020-03-30 NOTE — Telephone Encounter (Signed)
Called patient to assist with sending a manual transmission.    AT/AF burden 1.4%.  Longest AF event logged 12 minutes. Recent ablation completed on 03/15/20. Advised I will forward to Dr. Johney Frame and Dr. Excell Seltzer to advise.   ED precautions given with verbal understanding.

## 2020-04-03 ENCOUNTER — Ambulatory Visit: Payer: Medicare Other | Admitting: Cardiovascular Disease

## 2020-04-03 ENCOUNTER — Encounter: Payer: Self-pay | Admitting: Cardiovascular Disease

## 2020-04-03 ENCOUNTER — Other Ambulatory Visit: Payer: Self-pay

## 2020-04-03 VITALS — BP 130/100 | HR 60 | Ht 68.5 in | Wt 173.6 lb

## 2020-04-03 DIAGNOSIS — I350 Nonrheumatic aortic (valve) stenosis: Secondary | ICD-10-CM

## 2020-04-03 DIAGNOSIS — I4819 Other persistent atrial fibrillation: Secondary | ICD-10-CM

## 2020-04-03 DIAGNOSIS — R42 Dizziness and giddiness: Secondary | ICD-10-CM

## 2020-04-03 NOTE — Progress Notes (Signed)
Cardiology Office Note:    Date:  04/06/2020   ID:  [name], DOB 1950-09-04, MRN [id number]  PCP:  Jefm Petty, MD  [facility] Cardiologist:  Sherren Mocha, MD  [facility] Electrophysiologist:  [name], MD   Referring MD: Jefm Petty, MD   Chief Complaint  Patient presents with  . Dizziness    History of Present Illness:    Jerry Foster is a 70 y.o. male with a hx of severe aortic stenosis status post bioprosthetic aortic valve replacement in 2021.  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  He has developed persistent atrial fibrillation and flutter.  The patient also had postoperative atrial fibrillation and was on amiodarone and warfarin for a few months after surgery.  After this was discontinued, he developed recurrent atrial fibrillation identified on pacemaker interrogations.  He ultimately underwent atrial fibrillation ablation 03/15/2020.  The patient presents today for follow-up evaluation.  He complains of frequent dizziness.  He feels lightheaded when he first gets up.  He has dizziness periodically throughout the day.  He feels somewhat unsteady on his feet.  He denies chest pain, chest pressure, or shortness of breath with low-level activity.  He has not been exerting himself recently.  He denies edema, orthopnea, or PND.  The patient has not had frank syncope.  Past Medical History:  Diagnosis Date  . Aortic stenosis   . Benign prostatic hyperplasia   . Bronchitis   . Cataract    bilateral   . CHF (congestive heart failure) ([place])   . Colon polyps   . Complete heart block ([place]) 07/28/2019  . Heart murmur   . History of fracture of vertebral column   . Hyperlipidemia   . Kidney stone   . Nonrheumatic aortic valve stenosis   . Osteoporosis   . Osteoporosis   . Pure hypercholesterolemia   . RBBB   . S/P minimally-invasive aortic valve replacement with bioprosthetic valve  07/23/2019   21 mm Edwards Inspiris Resilia stented bovine pericardial tissue valve via right mini thoracotomy approach  . Sensorineural hearing loss of both ears   . Thrombosed hemorrhoids    hsitory of  . Tubular adenoma of colon   . Vitamin D deficiency     Past Surgical History:  Procedure Laterality Date  . AORTIC VALVE REPLACEMENT N/A 07/23/2019   Procedure: MINIMALLY INVASIVE AORTIC VALVE REPLACEMENT (AVR) using Margaretha Sheffield Resilia 21 MM Aortic Valve.;  Surgeon: Rexene Alberts, MD;  Location: [place];  Service: Open Heart Surgery;  Laterality: N/A;  . ATRIAL FIBRILLATION ABLATION N/A 03/15/2020   Procedure: ATRIAL FIBRILLATION ABLATION;  Surgeon: [name], MD;  Location: [place] [facility];  Service: Cardiovascular;  Laterality: N/A;  . CARDIAC CATHETERIZATION    . COLON SURGERY  2006   1 foot ascending colon removed   . COLONOSCOPY    . HERNIA REPAIR    . PACEMAKER IMPLANT N/A 07/29/2019   Procedure: PACEMAKER IMPLANT;  Surgeon: [name], MD;  Location: [place] [facility];  Service: Cardiovascular;  Laterality: N/A;  . POLYPECTOMY    . RIGHT HEART CATH AND CORONARY ANGIOGRAPHY N/A 06/08/2019   Procedure: RIGHT HEART CATH AND CORONARY ANGIOGRAPHY;  Surgeon: Sherren Mocha, MD;  Location: [place] [facility];  Service: Cardiovascular;  Laterality: N/A;  . TEE WITHOUT CARDIOVERSION N/A 07/23/2019   Procedure: TRANSESOPHAGEAL ECHOCARDIOGRAM (TEE);  Surgeon: Rexene Alberts, MD;  Location: [place];  Service: Open Heart Surgery;  Laterality: N/A;  . TESTICLE SURGERY      Current Medications: Current Meds  Medication Sig  . acetaminophen (TYLENOL) 500 MG tablet Take 1,000 mg by mouth every 8 (eight) hours as needed for moderate pain or headache.  [name] amoxicillin (AMOXIL) 500 MG capsule as directed. For dental appts  . apixaban (ELIQUIS) 5 MG TABS tablet Take 1 tablet (5 mg total) by mouth 2 (two) times daily.  . Calcium Citrate-Vitamin D (CITRACAL + D PO) Take 2 tablets by  mouth in the morning and at bedtime.  . Cholecalciferol (DIALYVITE VITAMIN D 5000) 125 MCG (5000 UT) capsule Take 5,000 Units by mouth daily.  [name] ibuprofen (ADVIL) 200 MG tablet Take 400 mg by mouth every 6 (six) hours as needed for moderate pain.   . metoprolol tartrate (LOPRESSOR) 25 MG tablet Take 1 tablet (25 mg total) by mouth 2 (two) times daily.  . pantoprazole (PROTONIX) 40 MG tablet Take 1 tablet (40 mg total) by mouth daily.  . pravastatin (PRAVACHOL) 40 MG tablet Take 40 mg by mouth at bedtime.     Allergies:   Lamisil [terbinafine]   Social History   Socioeconomic History  . Marital status: Single    Spouse name: Not on file  . Number of children: Not on file  . Years of education: Not on file  . Highest education level: Professional school degree (e.g., MD, DDS, DVM, JD)  Occupational History  . Occupation: Retired Pharmacist, community  Tobacco Use  . Smoking status: Never Smoker  . Smokeless tobacco: Never Used  Vaping Use  . Vaping Use: Never used  Substance and Sexual Activity  . Alcohol use: Yes    Comment: Moderately, 2 bottles wine per week  . Drug use: No  . Sexual activity: Not on file  Other Topics Concern  . Not on file  Social History Narrative  . Not on file   Social Determinants of Health   Financial Resource Strain: Not on file  Food Insecurity: Not on file  Transportation Needs: Not on file  Physical Activity: Not on file  Stress: Not on file  Social Connections: Not on file     Family History: The patient's family history includes Heart disease in his maternal uncle; Hypertension in his brother. There is no history of Colon cancer, Colon polyps, Esophageal cancer, Rectal cancer, or Stomach cancer.  ROS:   Please see the history of present illness.    All other systems reviewed and are negative.  EKGs/Labs/Other Studies Reviewed:    The following studies were reviewed today: Cardiac Catheterization 06/08/2019: Conclusion  1. Mild nonobstructive  CAD without any high-grade or flow-limiting stenoses 2. Bulky aortic valve calcification with restricted leaflet mobility 3. Normal right heart pressures/preserved cardiac output  Plan: CTA studies, cardiac surgical evaluation for consideration of aortic valve replacement  Echo 12/08/2019: IMPRESSIONS    1. Left ventricular ejection fraction, by estimation, is 60 to 65%. The  left ventricle has normal function. The left ventricle has no regional  wall motion abnormalities. There is moderate left ventricular hypertrophy.  Left ventricular diastolic  parameters are consistent with Grade I diastolic dysfunction (impaired  relaxation).  2. Right ventricular systolic function is normal. The right ventricular  size is normal.  3. The aortic valve has been repaired/replaced. There is a 21 mm Edwards  Inspiris Resilia valve present in the aortic position. Echo findings are  consistent with normal structure and function of the aortic valve  prosthesis. Aortic valve area, by VTI  measures 1.23 cm. Aortic valve mean gradient measures 15.0 mmHg. Aortic  valve Vmax measures 2.71 m/s. Peak gradient 29 mmHg. DI 0.36.  4. The inferior vena cava is dilated in size with >50% respiratory  variability, suggesting right atrial pressure of 8 mmHg.   Comparison(s): Changes from prior study are noted. 09/14/2019: LVEF 50-55%,  21 mm Edwards Inspiris Resilia valve, mean gradient 8.5 mmHg.   EKG:  EKG is not ordered today.   Recent Labs: 07/31/2019: Magnesium 1.9 09/07/2019: ALT 27 12/24/2019: TSH 1.150 02/22/2020: BUN 21; Creatinine, Ser 0.90; Hemoglobin 15.4; Platelets 140; Potassium 4.2; Sodium 139  Recent Lipid Panel No results found for: CHOL, TRIG, HDL, CHOLHDL, VLDL, LDLCALC, LDLDIRECT   Risk Assessment/Calculations:     CHA2DS2-VASc Score = 2  This indicates a 2.2% annual risk of stroke. The patient's score is based upon: CHF History: No HTN History: No Diabetes History: No Stroke  History: No Vascular Disease History: Yes Age Score: 1 Gender Score: 0      Physical Exam:    VS:  BP (!) 130/100   Pulse 60   Ht 5' 8.5" (1.74 m)   Wt 173 lb 9.6 oz (78.7 kg)   SpO2 99%   BMI 26.01 kg/m     Wt Readings from Last 3 Encounters:  04/03/20 173 lb 9.6 oz (78.7 kg)  03/15/20 160 lb (72.6 kg)  02/21/20 165 lb (74.8 kg)     GEN:  Well nourished, well developed in no acute distress HEENT: Normal NECK: No JVD; No carotid bruits LYMPHATICS: No lymphadenopathy CARDIAC: RRR, 2/6 ejection murmur at the right upper sternal border, no diastolic murmur RESPIRATORY:  Clear to auscultation without rales, wheezing or rhonchi  ABDOMEN: Soft, non-tender, non-distended MUSCULOSKELETAL:  No edema; No deformity  SKIN: Warm and dry NEUROLOGIC:  Alert and oriented x 3 PSYCHIATRIC:  Normal affect   ASSESSMENT:    1. Persistent atrial fibrillation ([place])   2. Nonrheumatic aortic valve stenosis   3. Dizziness    PLAN:    In order of problems listed above:  1. The patient seems to be doing fairly well after atrial fibrillation ablation.  I reviewed his recent interrogation which showed an atrial tachycardia/atrial fibrillation burden of 1.4%.  He understands that he is not expected to have the full benefit of his ablation until about 3 months after the procedure.  He is tolerating oral anticoagulation without problems. 2. The patient's bioprosthetic aortic valve is functioning normally based on most recent echo assessment.  Mean gradient 15 mmHg with no paravalvular regurgitation.  He follows SBE prophylaxis as indicated. 3. I do not think his dizziness is related to atrial fibrillation based on the frequency of his symptoms and his low atrial fibrillation burden.  We checked orthostatic vital signs in the office today and they are normal.  His heart rate response is also normal.  He is reassured and will continue to increase his activity level as tolerated.  He will follow-up with  his primary care physician as needed.  Medication Adjustments/Labs and Tests Ordered: Current medicines are reviewed at length with the patient today.  Concerns regarding medicines are outlined above.  No orders of the defined types were placed in this encounter.  No orders of the defined types were placed in this encounter.   Patient Instructions  Medication Instructions:  Your provider recommends that you continue on your current medications as directed. Please refer to the Current Medication list given to you today.   *If you  need a refill on your cardiac medications before your next appointment, please call your pharmacy*   Follow-Up: At [facility], you and your health needs are our priority.  As part of our continuing mission to provide you with exceptional heart care, we have created designated Provider Care Teams.  These Care Teams include your primary Cardiologist (physician) and Advanced Practice Providers (APPs -  Physician Assistants and Nurse Practitioners) who all work together to provide you with the care you need, when you need it. Your next appointment:   6 month(s) The format for your next appointment:   In Person Provider:   You may see Sherren Mocha, MD or one of the following Advanced Practice Providers on your designated Care Team:    [name], PA-C  Robbie Lis, [place]      Signed, Sherren Mocha, MD  04/06/2020 7:56 AM    [place]

## 2020-04-03 NOTE — Patient Instructions (Signed)

## 2020-04-06 ENCOUNTER — Encounter: Payer: Self-pay | Admitting: Cardiovascular Disease

## 2020-04-11 NOTE — Progress Notes (Signed)
Primary Care Physician: Jefm Petty, MD Primary Cardiologist: Dr Burt Knack Primary Electrophysiologist: Dr Rayann Heman Referring Physician: Dr [name] Jerry is a 70 y.o. male with a history of RBBB, AS s/p AVR, HLD, tachybradycardia syndrome, transient CHB s/p PPM, persistent atrial fibrillation, and atrial flutter who presents for follow up in the [facility]. The patient was admitted electively and on 07/23/2019 he was taken the operating room where he underwent AVR.  He did develop postoperative atrial fibrillation as well as significant heart block. On 07/28/2019 he had a fall in the bathroom that was felt to be related to the heart block and sustained a significant laceration in the region of his left eyebrow requiring plastic surgery repair. His CT scan was also done at that time and showed an avulsion fracture the anterior inferior aspect of the C6 vertebral body. He was placed in a soft cervical immobilizer. CT scan of the head was unremarkable. Neurosurgery will follow up as an outpatient. He was seen by Dr Rayann Heman and a PPM was implanted. He has been started on Coumadin with a CHADS2VASC score of 2. He was also started on amiodarone and converted to SR prior to discharge. His amiodarone and warfarin were eventually discontinued as he had no recurrence of afib.  On follow up today, patient is s/p afib and flutter ablation with Dr Rayann Heman on 03/15/20. Patient reports he has done reasonably well. He denies CP, swallowing pain, or groin issues since ablation. He does have issues with chronic dizziness, no frank syncope. His device shows 1.4% afib burden.   Today, he denies symptoms of palpitations, shortness of breath, orthopnea, PND, lower extremity edema, syncope, snoring, daytime somnolence, bleeding, or neurologic sequela. The patient is tolerating medications without difficulties and is otherwise without complaint today.    Atrial Fibrillation Risk  Factors:  he does not have symptoms or diagnosis of sleep apnea.   he has a BMI of Body mass index is 26.22 kg/m.[name] Filed Weights   04/12/20 0928  Weight: 79.4 kg    Family History  Problem Relation Age of Onset  . Heart disease Maternal Uncle   . Hypertension Brother   . Colon cancer Neg Hx   . Colon polyps Neg Hx   . Esophageal cancer Neg Hx   . Rectal cancer Neg Hx   . Stomach cancer Neg Hx      Atrial Fibrillation Management history:  Previous antiarrhythmic drugs: amiodarone Previous cardioversions: none Previous ablations: 03/15/20 afib and flutter CHADS2VASC score: 2 Anticoagulation history: warfarin   Past Medical History:  Diagnosis Date  . Aortic stenosis   . Benign prostatic hyperplasia   . Bronchitis   . Cataract    bilateral   . CHF (congestive heart failure) ([place])   . Colon polyps   . Complete heart block ([place]) 07/28/2019  . Heart murmur   . History of fracture of vertebral column   . Hyperlipidemia   . Kidney stone   . Nonrheumatic aortic valve stenosis   . Osteoporosis   . Osteoporosis   . Pure hypercholesterolemia   . RBBB   . 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 Sensorineural hearing loss of both ears   . Thrombosed hemorrhoids    hsitory of  . Tubular adenoma of colon   . Vitamin D deficiency    Past Surgical History:  Procedure Laterality Date  . AORTIC VALVE REPLACEMENT N/A 07/23/2019   Procedure: MINIMALLY INVASIVE AORTIC VALVE REPLACEMENT (AVR) using Margaretha Sheffield Resilia 21 MM Aortic Valve.;  Surgeon: Rexene Alberts, MD;  Location: [place];  Service: Open Heart Surgery;  Laterality: N/A;  . ATRIAL FIBRILLATION ABLATION N/A 03/15/2020   Procedure: ATRIAL FIBRILLATION ABLATION;  Surgeon: [name], MD;  Location: [place] [facility];  Service: Cardiovascular;  Laterality:  N/A;  . CARDIAC CATHETERIZATION    . COLON SURGERY  2006   1 foot ascending colon removed   . COLONOSCOPY    . HERNIA REPAIR    . PACEMAKER IMPLANT N/A 07/29/2019   Procedure: PACEMAKER IMPLANT;  Surgeon: [name], MD;  Location: [place] [facility];  Service: Cardiovascular;  Laterality: N/A;  . POLYPECTOMY    . RIGHT HEART CATH AND CORONARY ANGIOGRAPHY N/A 06/08/2019   Procedure: RIGHT HEART CATH AND CORONARY ANGIOGRAPHY;  Surgeon: Sherren Mocha, MD;  Location: [place] [facility];  Service: Cardiovascular;  Laterality: N/A;  . TEE WITHOUT CARDIOVERSION N/A 07/23/2019   Procedure: TRANSESOPHAGEAL ECHOCARDIOGRAM (TEE);  Surgeon: Rexene Alberts, MD;  Location: [facility];  Service: Open Heart Surgery;  Laterality: N/A;  . TESTICLE SURGERY      Current Outpatient Medications  Medication Sig Dispense Refill  . acetaminophen (TYLENOL) 500 MG tablet Take 1,000 mg by mouth every 8 (eight) hours as needed for moderate pain or headache.    [name] amoxicillin (AMOXIL) 500 MG capsule as directed. For dental appts    . apixaban (ELIQUIS) 5 MG TABS tablet Take 1 tablet (5 mg total) by mouth 2 (two) times daily. 60 tablet 3  . Calcium Citrate-Vitamin D (CITRACAL + D PO) Take 2 tablets by mouth in the morning and at bedtime.    . Cholecalciferol (DIALYVITE VITAMIN D 5000) 125 MCG (5000 UT) capsule Take 5,000 Units by mouth daily.    [name] ibuprofen (ADVIL) 200 MG tablet Take 400 mg by mouth every 6 (six) hours as needed for moderate pain.     . metoprolol tartrate (LOPRESSOR) 25 MG tablet Take 1 tablet (25 mg total) by mouth 2 (two) times daily. 180 tablet 3  . pantoprazole (PROTONIX) 40 MG tablet Take 1 tablet (40 mg total) by mouth daily. 90 tablet 3  . pravastatin (PRAVACHOL) 40 MG tablet Take 40 mg by mouth at bedtime.     No current facility-administered medications for this encounter.    Allergies  Allergen Reactions  . Lamisil [Terbinafine]     Elevated liver enzymes (LFTs)    Social History    Socioeconomic History  . Marital status: Single    Spouse name: Not on file  . Number of children: Not on file  . Years of education: Not on file  . Highest education level: Professional school degree (e.g., MD, DDS, DVM, JD)  Occupational History  . Occupation: Retired Pharmacist, community  Tobacco Use  . Smoking status: Never Smoker  . Smokeless tobacco: Never Used  Vaping Use  . Vaping Use: Never used  Substance and Sexual Activity  . Alcohol use: Yes    Comment: Moderately, 2 bottles wine per week  . Drug use: No  . Sexual activity: Not on file  Other Topics Concern  . Not on file  Social History Narrative  . Not on file   Social Determinants of Health   Financial Resource Strain: Not on file  Food Insecurity: Not on file  Transportation Needs: Not on file  Physical  Activity: Not on file  Stress: Not on file  Social Connections: Not on file  Intimate Partner Violence: Not on file     ROS- All systems are reviewed and negative except as per the HPI above.  Physical Exam: Vitals:   04/12/20 0928  BP: 130/82  Pulse: 65  Weight: 79.4 kg    GEN- The patient is well appearing, alert and oriented x 3 today.   HEENT-head normocephalic, atraumatic, sclera clear, conjunctiva pink, hearing intact, trachea midline. Lungs- Clear to ausculation bilaterally, normal work of breathing Heart- Regular rate and rhythm, no murmurs, rubs or gallops  GI- soft, NT, ND, + BS Extremities- no clubbing, cyanosis, or edema MS- no significant deformity or atrophy Skin- no rash or lesion Psych- euthymic mood, full affect Neuro- strength and sensation are intact   Wt Readings from Last 3 Encounters:  04/12/20 79.4 kg  04/03/20 78.7 kg  03/15/20 72.6 kg    EKG today demonstrates  SR, RBBB Vent. rate 65 BPM PR interval 176 ms QRS duration 160 ms QT/QTc 440/457 ms  Echo 12/08/19 demonstrated  1. Left ventricular ejection fraction, by estimation, is 60 to 65%. The  left ventricle has  normal function. The left ventricle has no regional  wall motion abnormalities. There is moderate left ventricular hypertrophy.  Left ventricular diastolic  parameters are consistent with Grade I diastolic dysfunction (impaired  relaxation).  2. Right ventricular systolic function is normal. The right ventricular  size is normal.  3. The aortic valve has been repaired/replaced. There is a 21 mm Edwards  Inspiris Resilia valve present in the aortic position. Echo findings are  consistent with normal structure and function of the aortic valve  prosthesis. Aortic valve area, by VTI  measures 1.23 cm. Aortic valve mean gradient measures 15.0 mmHg. Aortic  valve Vmax measures 2.71 m/s. Peak gradient 29 mmHg. DI 0.36.  4. The inferior vena cava is dilated in size with >50% respiratory  variability, suggesting right atrial pressure of 8 mmHg.   Comparison(s): Changes from prior study are noted. 09/14/2019: LVEF 50-55%,  21 mm Edwards Inspiris Resilia valve, mean gradient 8.5 mmHg.  Epic records are reviewed at length today  CHA2DS2-VASc Score = 2  The patient's score is based upon: CHF History: No HTN History: No Diabetes History: No Stroke History: No Vascular Disease History: Yes Age Score: 1 Gender Score: 0      ASSESSMENT AND PLAN: 1. Persistent Atrial Fibrillation/atrial flutter The patient's CHA2DS2-VASc score is 2, indicating a 2.2% annual risk of stroke.   S/p afib and flutter ablation 03/15/20 AF burden on device 1.4% Continue Eliquis 5 mg BID Continue Lopressor 25 mg BID  2. Secondary Hypercoagulable State (ICD10:  D68.69) The patient is at significant risk for stroke/thromboembolism based upon his CHA2DS2-VASc Score of 2.  Continue Apixaban (Eliquis).   3. Transient CHB S/p PPM, followed by Dr Rayann Heman and the device clinic.  4. Aortic stenosis S/p bioprosthetic AVR 06/2019.  5. CAD Nonobstructive by cath 05/2019 No anginal symptoms.  6. Dizziness Improving,  does not appear related to afib.  Encouraged him to follow up with PCP.   Follow up with Dr Rayann Heman as scheduled.    [facility] [address] [place], [place] [address] [phone] 04/12/2020 9:44 AM

## 2020-04-12 ENCOUNTER — Ambulatory Visit (HOSPITAL_COMMUNITY)
Admission: RE | Admit: 2020-04-12 | Discharge: 2020-04-12 | Disposition: A | Discharge status: Home / Self Care | Payer: Medicare Other | Source: Ambulatory Visit | Attending: Physician Assistant | Admitting: Physician Assistant

## 2020-04-12 ENCOUNTER — Encounter (HOSPITAL_COMMUNITY): Payer: Self-pay | Admitting: Physician Assistant

## 2020-04-12 ENCOUNTER — Other Ambulatory Visit: Payer: Self-pay

## 2020-04-12 VITALS — BP 130/82 | HR 65 | Wt 175.0 lb

## 2020-04-12 DIAGNOSIS — I455 Other specified heart block: Secondary | ICD-10-CM | POA: Insufficient documentation

## 2020-04-12 DIAGNOSIS — I495 Sick sinus syndrome: Secondary | ICD-10-CM | POA: Insufficient documentation

## 2020-04-12 DIAGNOSIS — I350 Nonrheumatic aortic (valve) stenosis: Secondary | ICD-10-CM | POA: Diagnosis not present

## 2020-04-12 DIAGNOSIS — Z950 Presence of cardiac pacemaker: Secondary | ICD-10-CM | POA: Insufficient documentation

## 2020-04-12 DIAGNOSIS — I2510 Atherosclerotic heart disease of native coronary artery without angina pectoris: Secondary | ICD-10-CM | POA: Diagnosis not present

## 2020-04-12 DIAGNOSIS — I4510 Unspecified right bundle-branch block: Secondary | ICD-10-CM | POA: Diagnosis not present

## 2020-04-12 DIAGNOSIS — D6869 Other thrombophilia: Secondary | ICD-10-CM | POA: Insufficient documentation

## 2020-04-12 DIAGNOSIS — Z7901 Long term (current) use of anticoagulants: Secondary | ICD-10-CM | POA: Diagnosis not present

## 2020-04-12 DIAGNOSIS — R42 Dizziness and giddiness: Secondary | ICD-10-CM | POA: Diagnosis not present

## 2020-04-12 DIAGNOSIS — Z79899 Other long term (current) drug therapy: Secondary | ICD-10-CM | POA: Insufficient documentation

## 2020-04-12 DIAGNOSIS — I4819 Other persistent atrial fibrillation: Secondary | ICD-10-CM | POA: Insufficient documentation

## 2020-04-12 DIAGNOSIS — Z953 Presence of xenogenic heart valve: Secondary | ICD-10-CM | POA: Insufficient documentation

## 2020-04-28 ENCOUNTER — Ambulatory Visit (INDEPENDENT_AMBULATORY_CARE_PROVIDER_SITE_OTHER): Payer: Medicare Other

## 2020-04-28 DIAGNOSIS — I442 Atrioventricular block, complete: Secondary | ICD-10-CM

## 2020-04-28 LAB — CUP PACEART REMOTE DEVICE CHECK
Battery Remaining Longevity: 130 mo
Battery Remaining Percentage: 95.5 %
Battery Voltage: 3.02 V
Brady Statistic AP VP Percent: 1 %
Brady Statistic AP VS Percent: 24 %
Brady Statistic AS VP Percent: 1 %
Brady Statistic AS VS Percent: 76 %
Brady Statistic RA Percent Paced: 23 %
Brady Statistic RV Percent Paced: 1 %
Date Time Interrogation Session: 20220204020015
Implantable Lead Implant Date: 20210506
Implantable Lead Implant Date: 20210506
Implantable Lead Location: 753859
Implantable Lead Location: 753860
Implantable Pulse Generator Implant Date: 20210506
Lead Channel Impedance Value: 450 Ohm
Lead Channel Impedance Value: 490 Ohm
Lead Channel Pacing Threshold Amplitude: 0.5 V
Lead Channel Pacing Threshold Amplitude: 0.75 V
Lead Channel Pacing Threshold Pulse Width: 0.4 ms
Lead Channel Pacing Threshold Pulse Width: 0.4 ms
Lead Channel Sensing Intrinsic Amplitude: 2.4 mV
Lead Channel Sensing Intrinsic Amplitude: 5.5 mV
Lead Channel Setting Pacing Amplitude: 1 V
Lead Channel Setting Pacing Amplitude: 1.5 V
Lead Channel Setting Pacing Pulse Width: 0.4 ms
Lead Channel Setting Sensing Sensitivity: 2 mV
Pulse Gen Model: 2272
Pulse Gen Serial Number: 3825759

## 2020-05-01 ENCOUNTER — Telehealth: Payer: Self-pay | Admitting: Internal Medicine

## 2020-05-01 NOTE — Telephone Encounter (Signed)
Returning phone call. Advised patient he can still have break thorugh AF considering his ablation is less than 3 months.  AT/AF burden is <1% which has improved since 03/30/20. Made aware of follow up apt. With Dr. Rayann Heman 06/14/20 to follow up about ablation. Advised patient to call with any further questions or concerns. Thanked me for call.

## 2020-05-01 NOTE — Telephone Encounter (Signed)
Pt called in and would like to speak to a nurse about his last remote on 2/4.  He seen the results on mychart but does not understand them.   Best number [phone]

## 2020-05-02 DIAGNOSIS — M542 Cervicalgia: Secondary | ICD-10-CM | POA: Insufficient documentation

## 2020-05-02 DIAGNOSIS — R42 Dizziness and giddiness: Secondary | ICD-10-CM | POA: Insufficient documentation

## 2020-05-02 DIAGNOSIS — R2689 Other abnormalities of gait and mobility: Secondary | ICD-10-CM | POA: Insufficient documentation

## 2020-05-02 NOTE — Progress Notes (Signed)
Remote pacemaker transmission.   

## 2020-05-04 ENCOUNTER — Other Ambulatory Visit (HOSPITAL_COMMUNITY): Payer: Self-pay | Admitting: Physician Assistant

## 2020-05-26 ENCOUNTER — Telehealth: Payer: Self-pay | Admitting: Internal Medicine

## 2020-05-26 NOTE — Telephone Encounter (Signed)
    Pt is leaving to go to [place] on 03/07 and will be coming back on 06/07/20. He would like to know if he needs to bring his monitor with him

## 2020-05-26 NOTE — Telephone Encounter (Signed)
I spoke with the patient and let him know he do not have to take his monitor with him.

## 2020-06-14 ENCOUNTER — Ambulatory Visit: Payer: Medicare Other | Admitting: Internal Medicine

## 2020-06-14 ENCOUNTER — Other Ambulatory Visit: Payer: Self-pay

## 2020-06-14 ENCOUNTER — Encounter: Payer: Self-pay | Admitting: Internal Medicine

## 2020-06-14 VITALS — BP 144/88 | HR 65 | Ht 68.5 in | Wt 172.4 lb

## 2020-06-14 DIAGNOSIS — I495 Sick sinus syndrome: Secondary | ICD-10-CM

## 2020-06-14 DIAGNOSIS — I4819 Other persistent atrial fibrillation: Secondary | ICD-10-CM

## 2020-06-14 LAB — CUP PACEART INCLINIC DEVICE CHECK
Battery Remaining Longevity: 135 mo
Battery Voltage: 3.02 V
Brady Statistic RA Percent Paced: 22 %
Brady Statistic RV Percent Paced: 0.16 %
Date Time Interrogation Session: 20220323085100
Implantable Lead Implant Date: 20210506
Implantable Lead Implant Date: 20210506
Implantable Lead Location: 753859
Implantable Lead Location: 753860
Implantable Pulse Generator Implant Date: 20210506
Lead Channel Impedance Value: 437.5 Ohm
Lead Channel Impedance Value: 462.5 Ohm
Lead Channel Pacing Threshold Amplitude: 0.625 V
Lead Channel Pacing Threshold Amplitude: 1 V
Lead Channel Pacing Threshold Pulse Width: 0.4 ms
Lead Channel Pacing Threshold Pulse Width: 0.4 ms
Lead Channel Sensing Intrinsic Amplitude: 1.6 mV
Lead Channel Sensing Intrinsic Amplitude: 4.2 mV
Lead Channel Setting Pacing Amplitude: 1.25 V
Lead Channel Setting Pacing Amplitude: 1.625
Lead Channel Setting Pacing Pulse Width: 0.4 ms
Lead Channel Setting Sensing Sensitivity: 2 mV
Pulse Gen Model: 2272
Pulse Gen Serial Number: 3825759

## 2020-06-14 MED ORDER — METOPROLOL TARTRATE 25 MG PO TABS: 12.5000 mg | ORAL_TABLET | Freq: Two times a day (BID) | ORAL | 0 refills | Status: DC

## 2020-06-14 NOTE — Patient Instructions (Addendum)
Medication Instructions:  Stop Protonix Reduce your Metoprolol to 12.5 mg two time daily, then in 4 weeks stop   Your physician recommends that you continue on your current medications as directed. Please refer to the Current Medication list given to you today.  Labwork: None ordered.  Testing/Procedures: None ordered.  Follow-Up: Your physician wants you to follow-up in: 10/18/20 at 2:45 pm with [name], MD  Remote monitoring is used to monitor your Pacemaker from home. This monitoring reduces the number of office visits required to check your device to one time per year. It allows [place] to keep an eye on the functioning of your device to ensure it is working properly. You are scheduled for a device check from home on 07/28/20. You may send your transmission at any time that day. If you have a wireless device, the transmission will be sent automatically. After your physician reviews your transmission, you will receive a postcard with your next transmission date.  Any Other Special Instructions Will Be Listed Below (If Applicable).  If you need a refill on your cardiac medications before your next appointment, please call your pharmacy.

## 2020-06-14 NOTE — Progress Notes (Signed)
PCP: Jefm Petty, MD Primary Cardiologist: Dr Clenton Pare Jerry is a 70 y.o. male who presents today for routine electrophysiology followup.  Since his recent afib ablation, the patient reports doing very well.  he denies procedure related complications and is pleased with the results of the procedure.  Today, he denies symptoms of palpitations, chest pain, shortness of breath,  lower extremity edema, presyncope, or syncope.  He has noncardiac dizziness.  The patient is otherwise without complaint today.   Past Medical History:  Diagnosis Date  . Aortic stenosis   . Benign prostatic hyperplasia   . Bronchitis   . Cataract    bilateral   . CHF (congestive heart failure) ([place])   . Colon polyps   . Complete heart block ([place]) 07/28/2019  . Heart murmur   . History of fracture of vertebral column   . Hyperlipidemia   . Kidney stone   . Nonrheumatic aortic valve stenosis   . Osteoporosis   . Osteoporosis   . Pure hypercholesterolemia   . RBBB   . 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 Sensorineural hearing loss of both ears   . Thrombosed hemorrhoids    hsitory of  . Tubular adenoma of colon   . Vitamin D deficiency    Past Surgical History:  Procedure Laterality Date  . AORTIC VALVE REPLACEMENT N/A 07/23/2019   Procedure: MINIMALLY INVASIVE AORTIC VALVE REPLACEMENT (AVR) using Margaretha Sheffield Resilia 21 MM Aortic Valve.;  Surgeon: Rexene Alberts, MD;  Location: [place];  Service: Open Heart Surgery;  Laterality: N/A;  . ATRIAL FIBRILLATION ABLATION N/A 03/15/2020   Procedure: ATRIAL FIBRILLATION ABLATION;  Surgeon: [name], MD;  Location: [facility];  Service: Cardiovascular;  Laterality: N/A;  . CARDIAC CATHETERIZATION    . COLON SURGERY  2006   1 foot ascending colon removed   . COLONOSCOPY    . HERNIA  REPAIR    . PACEMAKER IMPLANT N/A 07/29/2019   Procedure: PACEMAKER IMPLANT;  Surgeon: [name], MD;  Location: [place] [facility];  Service: Cardiovascular;  Laterality: N/A;  . POLYPECTOMY    . RIGHT HEART CATH AND CORONARY ANGIOGRAPHY N/A 06/08/2019   Procedure: RIGHT HEART CATH AND CORONARY ANGIOGRAPHY;  Surgeon: Sherren Mocha, MD;  Location: [place];  Service: Cardiovascular;  Laterality: N/A;  . TEE WITHOUT CARDIOVERSION N/A 07/23/2019   Procedure: TRANSESOPHAGEAL ECHOCARDIOGRAM (TEE);  Surgeon: Rexene Alberts, MD;  Location: [facility];  Service: Open Heart Surgery;  Laterality: N/A;  . TESTICLE SURGERY      ROS- all systems are personally reviewed and negatives except as per HPI above  Current Outpatient Medications  Medication Sig Dispense Refill  . acetaminophen (TYLENOL) 500 MG tablet Take 1,000 mg by mouth every 8 (eight) hours as needed for moderate pain or headache.    [name] amoxicillin (AMOXIL) 500 MG capsule as directed. For dental appts    . Calcium Citrate-Vitamin D (CITRACAL + D PO) Take 2 tablets by mouth in the morning and at bedtime.    . Cholecalciferol (DIALYVITE VITAMIN D 5000) 125 MCG (5000 UT) capsule Take 5,000 Units by mouth daily.    [name] ELIQUIS 5 MG TABS tablet TAKE 1 TABLET(5 MG) BY MOUTH TWICE DAILY 60 tablet 6  . ibuprofen (ADVIL) 200 MG tablet Take 400 mg by mouth every 6 (six) hours as  needed for moderate pain.     . metoprolol tartrate (LOPRESSOR) 25 MG tablet Take 1 tablet (25 mg total) by mouth 2 (two) times daily. 180 tablet 3  . pravastatin (PRAVACHOL) 40 MG tablet Take 40 mg by mouth at bedtime.    . pantoprazole (PROTONIX) 40 MG tablet Take 1 tablet (40 mg total) by mouth daily. 90 tablet 3   No current facility-administered medications for this visit.    Physical Exam: Vitals:   06/14/20 0845  BP: (!) 144/88  Pulse: 65  SpO2: 99%  Weight: 172 lb 6.4 oz (78.2 kg)  Height: 5' 8.5" (1.74 m)    GEN- The patient is well appearing,  alert and oriented x 3 today.   Head- normocephalic, atraumatic Eyes-  Sclera clear, conjunctiva pink Ears- hearing intact Oropharynx- clear Lungs- Clear to ausculation bilaterally, normal work of breathing Heart- Regular rate and rhythm, no murmurs, rubs or gallops, PMI not laterally displaced GI- soft, NT, ND, + BS Extremities- no clubbing, cyanosis, or edema  EKG tracing ordered today is personally reviewed and shows sinus with RBBB  Assessment and Plan:  1. Paroxysmal atrial fibrillation/ atrial flutter Doing well s/p ablation chads2vasc score is 2.  Continue eliquis Wean metoprolol to off  2. Transient AV block post AVR Normal pacemaker function See Pace Art report No changes today he is not device dependant today   Return to see me in 4 months  [name] MD, [facility] 06/14/2020 8:56 AM

## 2020-07-28 ENCOUNTER — Ambulatory Visit (INDEPENDENT_AMBULATORY_CARE_PROVIDER_SITE_OTHER): Payer: Medicare Other

## 2020-07-28 DIAGNOSIS — I495 Sick sinus syndrome: Secondary | ICD-10-CM | POA: Diagnosis not present

## 2020-07-31 LAB — CUP PACEART REMOTE DEVICE CHECK
Battery Remaining Longevity: 125 mo
Battery Remaining Percentage: 95.5 %
Battery Voltage: 3.02 V
Brady Statistic AP VP Percent: 1 %
Brady Statistic AP VS Percent: 13 %
Brady Statistic AS VP Percent: 1 %
Brady Statistic AS VS Percent: 87 %
Brady Statistic RA Percent Paced: 13 %
Brady Statistic RV Percent Paced: 1 %
Date Time Interrogation Session: 20220508222942
Implantable Lead Implant Date: 20210506
Implantable Lead Implant Date: 20210506
Implantable Lead Location: 753859
Implantable Lead Location: 753860
Implantable Pulse Generator Implant Date: 20210506
Lead Channel Impedance Value: 440 Ohm
Lead Channel Impedance Value: 460 Ohm
Lead Channel Pacing Threshold Amplitude: 0.5 V
Lead Channel Pacing Threshold Amplitude: 0.875 V
Lead Channel Pacing Threshold Pulse Width: 0.4 ms
Lead Channel Pacing Threshold Pulse Width: 0.4 ms
Lead Channel Sensing Intrinsic Amplitude: 3.6 mV
Lead Channel Sensing Intrinsic Amplitude: 5.1 mV
Lead Channel Setting Pacing Amplitude: 1.125
Lead Channel Setting Pacing Amplitude: 1.5 V
Lead Channel Setting Pacing Pulse Width: 0.4 ms
Lead Channel Setting Sensing Sensitivity: 2 mV
Pulse Gen Model: 2272
Pulse Gen Serial Number: 3825759

## 2020-08-14 ENCOUNTER — Ambulatory Visit: Payer: Medicare Other | Admitting: Thoracic Surgery (Cardiothoracic Vascular Surgery)

## 2020-08-14 NOTE — Progress Notes (Signed)
Remote pacemaker transmission.   

## 2020-09-18 ENCOUNTER — Other Ambulatory Visit: Payer: Self-pay

## 2020-09-18 ENCOUNTER — Telehealth (INDEPENDENT_AMBULATORY_CARE_PROVIDER_SITE_OTHER): Payer: Medicare Other | Admitting: Thoracic Surgery (Cardiothoracic Vascular Surgery)

## 2020-09-18 ENCOUNTER — Encounter: Payer: Self-pay | Admitting: Thoracic Surgery (Cardiothoracic Vascular Surgery)

## 2020-09-18 DIAGNOSIS — Z953 Presence of xenogenic heart valve: Secondary | ICD-10-CM

## 2020-09-18 NOTE — Progress Notes (Signed)
[address]       [place] [address]             [phone]     CARDIOTHORACIC SURGERY TELEPHONE VIRTUAL OFFICE NOTE  Primary Cardiologist is Sherren Mocha, MD Electrophysiology Consultant is [name], MD PCP is Jefm Petty, MD   HPI:  I spoke with Jerry Foster (DOB 08-22-1950 ) via telephone on 09/18/2020 at 3:04 PM and verified that I was speaking with the correct person using more than one form of identification.  We discussed the fact that I was contacting them from my office and they were located at home, as well as the reason(s) for conducting our visit virtually instead of in-person.  The patient expressed understanding the circumstances and agreed to proceed as described.  Patient is a 70 year old male who underwent aortic valve replacement using stented bioprosthetic tissue valve via right minithoracotomy approach on July 23, 2019 for bicuspid aortic valve disease with severe symptomatic aortic stenosis.  Postoperatively the patient developed complete heart block requiring permanent pacemaker placement as well as recurrent atrial fibrillation and atrial flutter.  He underwent catheter-based ablation last December by Dr. Rayann Heman.  Since then he has done very well.  I spoke with the patient over the telephone today and he reports that he is not having any problems whatsoever related to his heart.  His exercise tolerance is good and he denies any exertional shortness of breath or chest discomfort.  He states that he does have a "equilibrium problem" and plans to see a neurologist.  He does not feel that this is related to his heart rhythm in fact he is confident that he is remaining in rhythm without any symptomatic atrial fibrillation and atrial flutter.  He remains anticoagulated using Eliquis.    Current Outpatient Medications  Medication Sig Dispense Refill   acetaminophen (TYLENOL) 500 MG tablet Take 1,000 mg by mouth every 8 (eight) hours as  needed for moderate pain or headache.     amoxicillin (AMOXIL) 500 MG capsule as directed. For dental appts     Calcium Citrate-Vitamin D (CITRACAL + D PO) Take 2 tablets by mouth in the morning and at bedtime.     Cholecalciferol (DIALYVITE VITAMIN D 5000) 125 MCG (5000 UT) capsule Take 5,000 Units by mouth daily.     ELIQUIS 5 MG TABS tablet TAKE 1 TABLET(5 MG) BY MOUTH TWICE DAILY 60 tablet 6   ibuprofen (ADVIL) 200 MG tablet Take 400 mg by mouth every 6 (six) hours as needed for moderate pain.      metoprolol tartrate (LOPRESSOR) 25 MG tablet Take 0.5 tablets (12.5 mg total) by mouth 2 (two) times daily for 28 days. 28 tablet 0   pravastatin (PRAVACHOL) 40 MG tablet Take 40 mg by mouth at bedtime.     No current facility-administered medications for this visit.     Diagnostic Tests:    ECHOCARDIOGRAM REPORT         Patient Name:   Jerry Foster  Date of Exam: 09/14/2019  Medical Rec #:  [id number]     Height:       69.0 in  Accession #:    [id number]    Weight:       156.0 lb  Date of Birth:  Jun 16, 1950     BSA:          1.859 m  Patient Foster:    64 years      BP:  132/80 mmHg  Patient Gender: M             HR:           60 bpm.  Exam Location:  [address]   Procedure: 2D Echo, 3D Echo, Cardiac Doppler and Color Doppler   Indications:    I35 Aortic stenosis     History:        Patient has prior history of Echocardiogram examinations,  most                  recent 07/23/2019. CHF, Pacemaker, Arrythmias:Atrial                  Fibrillation, RBBB and Complete heart block.,                  Signs/Symptoms:Murmur; Risk Factors:Dyslipidemia.                  Aortic Valve: 21 mm Edwards Inspiris Resilia valve is  present in                  the aortic position. Procedure Date: 07/23/19.     Sonographer:    Jessee Avers, RDCS  Referring Phys: [place]     1. Normal LV function; mild LVH; grade 1 diastolic dysfunction; s/p AVR  with mean  gradient of 9 mmHg and no AI.   2. Left ventricular ejection fraction, by estimation, is 50 to 55%. The  left ventricle has low normal function. The left ventricle has no regional  wall motion abnormalities. There is mild left ventricular hypertrophy.  Left ventricular diastolic  parameters are consistent with Grade I diastolic dysfunction (impaired  relaxation). Elevated left atrial pressure.   3. Right ventricular systolic function is normal. The right ventricular  size is normal. There is normal pulmonary artery systolic pressure.   4. The mitral valve is normal in structure. Trivial mitral valve  regurgitation. No evidence of mitral stenosis.   5. The aortic valve has been repaired/replaced. Aortic valve  regurgitation is not visualized. No aortic stenosis is present. There is a  21 mm Edwards Inspiris Resilia valve present in the aortic position.  Procedure Date: 07/23/19.   6. The inferior vena cava is normal in size with greater than 50%  respiratory variability, suggesting right atrial pressure of 3 mmHg.   Comparison(s): Intraop TEE 07/23/19 EF 60-65%.   FINDINGS   Left Ventricle: Left ventricular ejection fraction, by estimation, is 50  to 55%. The left ventricle has low normal function. The left ventricle has  no regional wall motion abnormalities. The left ventricular internal  cavity size was normal in size.  There is mild left ventricular hypertrophy. Left ventricular diastolic  parameters are consistent with Grade I diastolic dysfunction (impaired  relaxation). Elevated left atrial pressure.   Right Ventricle: The right ventricular size is normal.Right ventricular  systolic function is normal. There is normal pulmonary artery systolic  pressure. The tricuspid regurgitant velocity is 2.18 m/s, and with an  assumed right atrial pressure of 3 mmHg,  the estimated right ventricular systolic pressure is 62.0 mmHg.   Left Atrium: Left atrial size was normal in size.    Right Atrium: Right atrial size was normal in size.   Pericardium: There is no evidence of pericardial effusion.   Mitral Valve: The mitral valve is normal in structure. Normal mobility of  the mitral valve leaflets. Trivial mitral valve regurgitation. No evidence  of mitral valve stenosis.   Tricuspid Valve: The tricuspid valve is normal in structure. Tricuspid  valve regurgitation is trivial. No evidence of tricuspid stenosis.   Aortic Valve: The aortic valve has been repaired/replaced. Aortic valve  regurgitation is not visualized. No aortic stenosis is present. Aortic  valve mean gradient measures 8.5 mmHg. Aortic valve peak gradient measures  15.1 mmHg. There is a 21 mm Edwards  Inspiris Resilia valve present in the aortic position. Procedure Date:  07/23/19.   Pulmonic Valve: The pulmonic valve was normal in structure. Pulmonic valve  regurgitation is trivial. No evidence of pulmonic stenosis.   Aorta: The aortic root is normal in size and structure.   Venous: The inferior vena cava is normal in size with greater than 50%  respiratory variability, suggesting right atrial pressure of 3 mmHg.   IAS/Shunts: No atrial level shunt detected by color flow Doppler.   Additional Comments: Normal LV function; mild LVH; grade 1 diastolic  dysfunction; s/p AVR with mean gradient of 9 mmHg and no AI. A pacer wire  is visualized.      LEFT VENTRICLE  PLAX 2D  LVIDd:         4.90 cm Diastology  LVIDs:         3.45 cm LV e' lateral:   7.75 cm/s  LV PW:         0.80 cm LV E/e' lateral: 9.3  LV IVS:        0.80 cm LV e' medial:    4.31 cm/s                         LV E/e' medial:  16.7                            3D Volume EF:                         3D EF:        152 %                         LV EDV:       67 ml                         LV ESV:       56 ml                         LV SV:        86 ml   RIGHT VENTRICLE  RV Basal diam:  3.20 cm  RV S prime:     9.12 cm/s  TAPSE  (M-mode): 1.6 cm  RVSP:           22.0 mmHg   LEFT ATRIUM             Index       RIGHT ATRIUM           Index  LA diam:        3.80 cm 2.04 cm/m  RA Pressure: 3.00 mmHg  LA Vol (A2C):   65.4 ml 35.18 ml/m RA Area:     14.10 cm  LA Vol (A4C):   43.7 ml 23.51 ml/m RA Volume:   35.80 ml  19.26 ml/m  LA Biplane Vol: 57.4 ml  30.88 ml/m   AORTIC VALVE  AV Vmax:           194.00 cm/s  AV Vmean:          142.000 cm/s  AV VTI:            0.437 m  AV Peak Grad:      15.1 mmHg  AV Mean Grad:      8.5 mmHg  LVOT Vmax:         110.00 cm/s  LVOT Vmean:        75.400 cm/s  LVOT VTI:          0.249 m  LVOT/AV VTI ratio: 0.57     AORTA  Ao Root diam: 3.10 cm  Ao Asc diam:  3.00 cm   MITRAL VALVE               TRICUSPID VALVE                             TR Peak grad:   19.0 mmHg                             TR Vmax:        218.00 cm/s  MV E velocity: 72.00 cm/s  Estimated RAP:  3.00 mmHg  MV A velocity: 84.40 cm/s  RVSP:           22.0 mmHg  MV E/A ratio:  0.85                             SHUNTS                             Systemic VTI: 0.25 m   Kirk Ruths MD  Electronically signed by Kirk Ruths MD  Signature Date/Time: 09/14/2019/1:38:06 PM       Impression:  Patient is doing very well more than 1 year status post aortic valve replacement with bioprosthetic tissue valve  Plan:  Patient will continue to follow-up regularly with Dr. Burt Knack and Dr. Rayann Heman.  The patient has been reminded regarding the importance of dental hygiene and the lifelong need for antibiotic prophylaxis for all dental cleanings and other related invasive procedures.  All of his questions have been answered.    I discussed limitations of evaluation and management via telephone.  The patient was advised to call back for repeat telephone consultation or to seek an in-person evaluation if questions arise or the patient's clinical condition changes in any significant manner.  I spent in excess of 5  minutes of non-face-to-face time during the conduct of this telephone virtual office consultation, including pre-visit review of the patient's records and direct conversation with the patient.      Valentina Gu. Roxy Manns, MD 09/18/2020 3:04 PM

## 2020-10-16 ENCOUNTER — Encounter: Payer: Self-pay | Admitting: Gastroenterology

## 2020-10-18 ENCOUNTER — Encounter: Payer: Medicare Other | Admitting: Internal Medicine

## 2020-10-27 ENCOUNTER — Ambulatory Visit (INDEPENDENT_AMBULATORY_CARE_PROVIDER_SITE_OTHER): Payer: Medicare Other

## 2020-10-27 DIAGNOSIS — I495 Sick sinus syndrome: Secondary | ICD-10-CM | POA: Diagnosis not present

## 2020-10-30 LAB — CUP PACEART REMOTE DEVICE CHECK
Battery Remaining Longevity: 120 mo
Battery Remaining Percentage: 91 %
Battery Voltage: 3.02 V
Brady Statistic AP VP Percent: 1 %
Brady Statistic AP VS Percent: 11 %
Brady Statistic AS VP Percent: 1 %
Brady Statistic AS VS Percent: 89 %
Brady Statistic RA Percent Paced: 11 %
Brady Statistic RV Percent Paced: 1 %
Date Time Interrogation Session: 20220807231157
Implantable Lead Implant Date: 20210506
Implantable Lead Implant Date: 20210506
Implantable Lead Location: 753859
Implantable Lead Location: 753860
Implantable Pulse Generator Implant Date: 20210506
Lead Channel Impedance Value: 460 Ohm
Lead Channel Impedance Value: 490 Ohm
Lead Channel Pacing Threshold Amplitude: 0.5 V
Lead Channel Pacing Threshold Amplitude: 1 V
Lead Channel Pacing Threshold Pulse Width: 0.4 ms
Lead Channel Pacing Threshold Pulse Width: 0.4 ms
Lead Channel Sensing Intrinsic Amplitude: 4.3 mV
Lead Channel Sensing Intrinsic Amplitude: 4.7 mV
Lead Channel Setting Pacing Amplitude: 1.25 V
Lead Channel Setting Pacing Amplitude: 1.5 V
Lead Channel Setting Pacing Pulse Width: 0.4 ms
Lead Channel Setting Sensing Sensitivity: 2 mV
Pulse Gen Model: 2272
Pulse Gen Serial Number: 3825759

## 2020-11-01 ENCOUNTER — Ambulatory Visit: Payer: Medicare Other | Admitting: Internal Medicine

## 2020-11-01 ENCOUNTER — Other Ambulatory Visit: Payer: Self-pay

## 2020-11-01 ENCOUNTER — Encounter: Payer: Self-pay | Admitting: Internal Medicine

## 2020-11-01 VITALS — BP 132/82 | HR 74 | Ht 68.5 in | Wt 165.0 lb

## 2020-11-01 DIAGNOSIS — I480 Paroxysmal atrial fibrillation: Secondary | ICD-10-CM

## 2020-11-01 DIAGNOSIS — I442 Atrioventricular block, complete: Secondary | ICD-10-CM

## 2020-11-01 DIAGNOSIS — I483 Typical atrial flutter: Secondary | ICD-10-CM

## 2020-11-01 NOTE — Patient Instructions (Addendum)
Medication Instructions:  Your physician recommends that you continue on your current medications as directed. Please refer to the Current Medication list given to you today.  Labwork: None ordered.  Testing/Procedures: None ordered.  Follow-Up: Your physician wants you to follow-up in: 6 months with the [facility], 3-4 months with Dr. Burt Knack (overdue) 12 months with Dr. Rayann Heman.   Remote monitoring is used to monitor your Pacemaker from home. This monitoring reduces the number of office visits required to check your device to one time per year. It allows [place] to keep an eye on the functioning of your device to ensure it is working properly. You are scheduled for a device check from home on 01/26/21. You may send your transmission at any time that day. If you have a wireless device, the transmission will be sent automatically. After your physician reviews your transmission, you will receive a postcard with your next transmission date.  Any Other Special Instructions Will Be Listed Below (If Applicable).  If you need a refill on your cardiac medications before your next appointment, please call your pharmacy.

## 2020-11-01 NOTE — Progress Notes (Signed)
PCP: Jefm Petty, MD Primary Cardiologist: Dr Burt Knack Primary EP:  Dr [name] Jerry is a 70 y.o. male who presents today for routine electrophysiology followup.  Since last being seen in our clinic, the patient reports doing very well.  Today, he denies symptoms of palpitations, chest pain, shortness of breath,  lower extremity edema, dizziness, presyncope, or syncope.  The patient is otherwise without complaint today.   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   21 mm Edwards Inspiris Resilia stented bovine pericardial tissue valve via right mini thoracotomy approach   Sensorineural hearing loss of both ears    Thrombosed hemorrhoids    hsitory of   Tubular adenoma of colon    Vitamin D deficiency    Past Surgical History:  Procedure Laterality Date   AORTIC VALVE REPLACEMENT N/A 07/23/2019   Procedure: MINIMALLY INVASIVE AORTIC VALVE REPLACEMENT (AVR) using Margaretha Sheffield Resilia 21 MM Aortic Valve.;  Surgeon: Rexene Alberts, MD;  Location: [place];  Service: Open Heart Surgery;  Laterality: N/A;   ATRIAL FIBRILLATION ABLATION N/A 03/15/2020   Procedure: ATRIAL FIBRILLATION ABLATION;  Surgeon: [name], MD;  Location: [place] [facility];  Service: Cardiovascular;  Laterality: N/A;   CARDIAC CATHETERIZATION     COLON SURGERY  2006   1 foot ascending colon removed    COLONOSCOPY     HERNIA REPAIR     PACEMAKER IMPLANT N/A 07/29/2019   Procedure: PACEMAKER IMPLANT;  Surgeon: [name], MD;   Location: [place] [facility];  Service: Cardiovascular;  Laterality: N/A;   POLYPECTOMY     RIGHT HEART CATH AND CORONARY ANGIOGRAPHY N/A 06/08/2019   Procedure: RIGHT HEART CATH AND CORONARY ANGIOGRAPHY;  Surgeon: Sherren Mocha, MD;  Location: [place] [facility];  Service: Cardiovascular;  Laterality: N/A;   TEE WITHOUT CARDIOVERSION N/A 07/23/2019   Procedure: TRANSESOPHAGEAL ECHOCARDIOGRAM (TEE);  Surgeon: Rexene Alberts, MD;  Location: [place];  Service: Open Heart Surgery;  Laterality: N/A;   TESTICLE SURGERY      ROS- all systems are reviewed and negative except as per HPI above  Current Outpatient Medications  Medication Sig Dispense Refill   acetaminophen (TYLENOL) 500 MG tablet Take 1,000 mg by mouth every 8 (eight) hours as needed for moderate pain or headache.     amoxicillin (AMOXIL) 500 MG capsule as directed. For dental appts     Calcium Citrate-Vitamin D (CITRACAL + D PO) Take 2 tablets by mouth in the morning and at bedtime.     Cholecalciferol (DIALYVITE VITAMIN D 5000) 125 MCG (5000 UT) capsule Take 5,000 Units by mouth daily.     ELIQUIS 5 MG TABS tablet TAKE 1 TABLET(5 MG) BY MOUTH TWICE DAILY 60 tablet 6   ibuprofen (ADVIL) 200 MG tablet Take 400 mg by mouth every 6 (six) hours as needed for moderate pain.      terbinafine (LAMISIL) 250  MG tablet Take 250 mg by mouth daily.     pravastatin (PRAVACHOL) 40 MG tablet Take 40 mg by mouth at bedtime. (Patient not taking: Reported on 11/01/2020)     No current facility-administered medications for this visit.    Physical Exam: Vitals:   11/01/20 0927  BP: 132/82  Pulse: 74  SpO2: 97%  Weight: 165 lb (74.8 kg)  Height: 5' 8.5" (1.74 m)    GEN- The patient is well appearing, alert and oriented x 3 today.   Head- normocephalic, atraumatic Eyes-  Sclera clear, conjunctiva pink Ears- hearing intact Oropharynx- clear Lungs- Clear to ausculation bilaterally, normal work of breathing Chest- pacemaker pocket is well  healed Heart- Regular rate and rhythm, no murmurs, rubs or gallops, PMI not laterally displaced GI- soft, NT, ND, + BS Extremities- no clubbing, cyanosis, or edema  Pacemaker interrogation- reviewed in detail today,  See PACEART report  ekg tracing ordered today is personally reviewed and shows sinus with PACs, RBBB  Assessment and Plan:  1. Symptomatic transient complete heart block Normal pacemaker function See Pace Art report No changes today he is not device dependant today  2. Paroxysmal atrial fibrillation/ atrial flutter Doing well s/p ablation 03/15/20 Afib burden is 1.3% by PPM remotes [id number] Risks, benefits and potential toxicities for medications prescribed and/or refilled reviewed with patient today.   Return to see AF clinic in 6 months I will see in a year  JThompson GrayerMD, FMinimally Invasive Surgery Hawaii8/12/2020 9:32 AM

## 2020-11-16 NOTE — Progress Notes (Signed)
Remote pacemaker transmission.   

## 2020-11-19 NOTE — Progress Notes (Signed)
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    He presents to our office today to schedule a colonoscopy with Dr. Havery Moros. His most recent colonoscopy was 04/10/2017 which identified 3 tubular adenomatous polyps which were removed from the ascending and transverse colon, intact anastomosis site and a tortuous colon. He was advised by Dr. Havery Moros to repeat a colonoscopy in 3 years. He denies having any upper or lower abdominal pain. He is passing a normal formed brown bowel movement daily. No rectal bleeding or black stools. No GERD symptoms. No CP, palpitations or SOB. He has lost 5lbs over the past 3 to 4 months.   History of non obstructive CAD, severe aortic stenosis s/p bioprosthetic aortic valve replacement 06/2019 with post-op CHB s/p pacemaker placement 07/2019 and paroxysmal atrial fibrillation/atrial flutter s/p ablation 02/2020 on Eliquis. He is followed by cardiologist Dr. Burt Knack and electophysiologist Dr. Rayann Heman. He was last seen in office by Dr. Burt Knack on 04/06/2020 with complaints of dizziness without CP, SOB or syncope. He did not have orthostatic hypotension at that time and Dr. Burt Knack did not assess his dizziness was related to his afib. He was advised to increased his activity as tolerated and to follow up 09/2020 which has not been done. He has an appointment scheduled with Dr. Burt Knack on 02/20/2021. He was  last seen by Dr. Marlou Porch on 11/01/20. At that time his EKG showed a sinus rhythm with PACs and a RBBB. His pacemaker was functioning appropriately. He was advised to follow up in the fib clinic in 6 months.  He was seen by cardiothoracic surgeon Dr. Darylene Price 09/18/2020 via telephone visit. At that time, he reported having equilibrium problems without any chest pain or SOB. Dr. Roxy Manns overall thought he was doing very well one year post aortic valve replacement with bioprosthetic tissue valve surgery with instructions to continue regular follow up with Dr. Burt Knack and Dr. Rayann Heman.   He complains of having memory issues, intermittent dizziness, poor balance and double vision. He was evaluated by neurologist Dr. Doy Hutching at [facility] 09/19/2020. He was assessed to have vertigo and past lacunar infarcts (per head CT 04/21/2020).  Labs done during this visit showed a low iron and iron saturation levels without anemia. Hg 15.8.   He continues to have intermittent dizziness and double vision which occurs once every 10 days and sometimes occurs while he is driving. He covers one eye while he drives which "corrects his double vision well enough to drive". He sometimes pulls off to the side of the road and parks his car until the episode of double vision abates.   Labs in Care Everywhere: 07/11/2020: Iron 42. Iron saturation 13%. TIBC 314.  Ferritin 40. WBC 6.6. Hg 15.8. MCV 88.2. PLT 98.  Total bili 0.7.  Alk phos 60.  AST 37.  ALT 26.  BUN 17.  Creatinine 0.77.  10/30/2020: Iron 91. Iron saturation 25%. TIBC 357. Ferritin 40. WBC 9.5. Hg 16.4. MCV 88.2. PLT  112.    Head CT 04/21/20:  No evidence of acute intracranial abnormality.   Chronic lacunar infarcts within the right basal ganglia and possibly also within the left thalamus.   Mild generalized atrophy of the brain.   Unchanged from the prior head CT of 07/28/2019, there are sclerotic changes within the right frontal calvarium/orbital  roof. 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 Clinical correlation is recommended.   Mild paranasal sinus disease as described.  ECHO 12/08/2019: 1. Left ventricular ejection fraction, by estimation, is 60 to 65%. The left ventricle has normal function. The left ventricle has no regional wall motion abnormalities. There is moderate left ventricular hypertrophy. Left ventricular diastolic parameters are consistent with Grade I diastolic dysfunction (impaired relaxation). 2. Right ventricular systolic function is normal. The right ventricular size is normal. 3. The aortic valve has been repaired/replaced. There is a 21 mm Edwards Inspiris Resilia valve present in the aortic position. Echo findings are consistent with normal structure and function of the aortic valve prosthesis. Aortic valve area, by VTI measures 1.23 cm. Aortic valve mean gradient measures 15.0 mmHg. Aortic valve Vmax measures 2.71 m/s. Peak gradient 29 mmHg. DI 0.36. 4. The inferior vena cava is dilated in size with >50% respiratory variability, suggesting right atrial pressure of 8 mmHg.  Past GI procedures:  Colonoscopy 04/10/2017: - Patent end-to-side ileo-colonic anastomosis, characterized by healthy appearing mucosa. - One 3 mm polyp in the ascending colon, removed with a cold snare. Resected and retrieved. - Two 3 to 5 mm polyps in the transverse colon, removed with a cold snare. Resected and retrieved. - Tortuous colon. - Internal hemorrhoids. - The examination was otherwise normal. - 3 year recall  Biopsy Result: - TUBULAR ADENOMA (THREE FRAGMENTS). - NEGATIVE FOR HIGH GRADE DYSPLASIA OR MALIGNANCY.  Colonoscopy done in 2006 identified a right colon villous adenoma s/p resection per records reviewed previously by Dr. Havery Moros ( I was not able to locate these records in Epic or Care Everywhere).   CBC Latest Ref Rng & Units 02/22/2020 12/24/2019  09/07/2019  WBC 3.4 - 10.8 x10E3/uL 7.7 6.1 7.5  Hemoglobin 13.0 - 17.7 g/dL 15.4 16.3 14.8  Hematocrit 37.5 - 51.0 % 44.6 48.9 45.9  Platelets 150 - 450 x10E3/uL 140(L) 162 163    CMP Latest Ref Rng & Units 02/22/2020 12/24/2019 09/07/2019  Glucose 65 - 99 mg/dL 99 92 88  BUN 8 - 27 mg/dL _0 Creatinine 0.76 - 1.27 mg/dL 0.90 0.85 0.81  Sodium 134 - 144 mmol/L 139 137 137  Potassium 3.5 - 5.2 mmol/L 4.2 4.2 4.7  Chloride 96 - 106 mmol/L 102 100 101  CO2 20 - 29 mmol/L _1 Calcium 8.6 - 10.2 mg/dL 9.5 9.9 9.5  Total Protein 6.0 - 8.5 g/dL - - 6.7  Total Bilirubin 0.0 - 1.2 mg/dL - - 0.4  Alkaline Phos 48 - 121 IU/L - - 73  AST 0 - 40 IU/L - - 33  ALT 0 - 44 IU/L - - 27     Past Medical History:  Diagnosis Date   Aortic stenosis    Benign prostatic hyperplasia    Bronchitis    Cataract    bilateral    CHF (congestive heart failure) (HCC)    Colon polyps    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   Heart murmur    History of fracture of vertebral column    Hyperlipidemia  Kidney stone    Nonrheumatic aortic valve stenosis    Osteoporosis    Osteoporosis    Pure hypercholesterolemia    RBBB    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   21 mm Edwards Inspiris Resilia stented bovine pericardial tissue valve via right mini thoracotomy approach   Sensorineural hearing loss of both ears    Thrombosed hemorrhoids    hsitory of   Tubular adenoma of colon    Vitamin D deficiency    Past Surgical History:  Procedure Laterality Date   AORTIC VALVE REPLACEMENT N/A 07/23/2019   Procedure: MINIMALLY INVASIVE AORTIC VALVE REPLACEMENT (AVR) using Margaretha Sheffield Resilia 21 MM Aortic Valve.;  Surgeon: Rexene Alberts, MD;  Location: [place];  Service: Open Heart Surgery;  Laterality: N/A;   ATRIAL FIBRILLATION ABLATION N/A 03/15/2020   Procedure: ATRIAL FIBRILLATION ABLATION;  Surgeon: [name], MD;  Location: [place] [facility];   Service: Cardiovascular;  Laterality: N/A;   CARDIAC CATHETERIZATION     COLON SURGERY  2006   1 foot ascending colon removed    COLONOSCOPY     HERNIA REPAIR     PACEMAKER IMPLANT N/A 07/29/2019   Procedure: PACEMAKER IMPLANT;  Surgeon: [name], MD;  Location: [place] [facility];  Service: Cardiovascular;  Laterality: N/A;   POLYPECTOMY     RIGHT HEART CATH AND CORONARY ANGIOGRAPHY N/A 06/08/2019   Procedure: RIGHT HEART CATH AND CORONARY ANGIOGRAPHY;  Surgeon: Sherren Mocha, MD;  Location: [place] [facility];  Service: Cardiovascular;  Laterality: N/A;   TEE WITHOUT CARDIOVERSION N/A 07/23/2019   Procedure: TRANSESOPHAGEAL ECHOCARDIOGRAM (TEE);  Surgeon: Rexene Alberts, MD;  Location: [place];  Service: Open Heart Surgery;  Laterality: N/A;   TESTICLE SURGERY     Social History:  He is a Pharmacist, community. Non smoker. He drink one alcoholic beverage once weekly or less. No drug use. He drinks 3 cups of coffee in the morning.   Family History: No known family history of esophageal, gastric or colon cancer. Brother with HTN.    Allergies  Allergen Reactions   Lamisil [Terbinafine]     Elevated liver enzymes (LFTs)     Outpatient Encounter Medications as of 11/20/2020  Medication Sig   acetaminophen (TYLENOL) 500 MG tablet Take 1,000 mg by mouth every 8 (eight) hours as needed for moderate pain or headache.   amoxicillin (AMOXIL) 500 MG capsule as directed. For dental appts   Calcium Citrate-Vitamin D (CITRACAL + D PO) Take 2 tablets by mouth in the morning and at bedtime.   Cholecalciferol (DIALYVITE VITAMIN D 5000) 125 MCG (5000 UT) capsule Take 5,000 Units by mouth daily.   ELIQUIS 5 MG TABS tablet TAKE 1 TABLET(5 MG) BY MOUTH TWICE DAILY   ibuprofen (ADVIL) 200 MG tablet Take 400 mg by mouth every 6 (six) hours as needed for moderate pain.    pravastatin (PRAVACHOL) 40 MG tablet Take 40 mg by mouth at bedtime. (Patient not taking: Reported on 11/01/2020)   terbinafine (LAMISIL) 250 MG  tablet Take 250 mg by mouth daily.   No facility-administered encounter medications on file as of 11/20/2020.    REVIEW OF SYSTEMS:  Gen: Denies fever, sweats or chills. No weight loss.  CV: Denies chest pain, palpitations or edema. Resp: Denies cough, shortness of breath of hemoptysis.  GI: See HPI. GU : Denies urinary burning, blood in urine, increased urinary frequency or incontinence. MS: Denies joint pain, muscles aches or weakness. Derm: Denies  rash, itchiness, skin lesions or unhealing ulcers. Psych: + Memory loss. No anxiety or depression.  Heme: Denies bruising, bleeding. Neuro:  See HPI. Endo:  Denies any problems with DM, thyroid or adrenal function.  PHYSICAL EXAM: BP 140/90   Pulse 75   Ht 5' 8.5" (1.74 m)   Wt 165 lb 4 oz (75 kg)   BMI 24.76 kg/m   Wt Readings from Last 3 Encounters:  11/20/20 165 lb 4 oz (75 kg)  11/01/20 165 lb (74.8 kg)  06/14/20 172 lb 6.4 oz (78.2 kg)    General: 70 year old male in no acute distress. Head: Normocephalic and atraumatic. Eyes:  Sclerae non-icteric, conjunctive pink. Ears: Normal auditory acuity. Mouth: Dentition intact. No ulcers or lesions.  Neck: Supple, no lymphadenopathy or thyromegaly.  Lungs: Clear bilaterally to auscultation without wheezes, crackles or rhonchi. Heart: Regular rate and rhythm. Systolic murmur. No rub or gallop appreciated.  Abdomen: Soft, nontender, non distended. No masses. No hepatosplenomegaly. Normoactive bowel sounds x 4 quadrants.  Rectal: Deferred.  Musculoskeletal: Symmetrical with no gross deformities. Skin: Warm and dry. No rash or lesions on visible extremities. Extremities: No edema. Neurological: Alert oriented x 4, no focal deficits.  Psychological:  Alert and cooperative. Normal mood and affect.  ASSESSMENT AND PLAN:  86) 70 year old male with a past history of an advanced right sided villous adenoma s/p surgical resection 2006 and tubular adenomatous colon polyps in 2019 due for  a surveillance colonoscopy. -Colonoscopy to be scheduled after cardiac clearance and neurology clearance received  -Our office will contact cardiologist Dr. Rayann Heman to verify Eliquis instructions prior to proceeding with a colonoscopy  -Colonoscopy benefits and risks discussed including risk with sedation, risk of bleeding, perforation and infection   2) Significant cardiac history including nonobstructive CAD, AS s/p bioprosthetic aortic valve replacement 06/2019 with post-op CHB s/p pacemaker placement 07/2019, paroxysmal atrial fibrillation/atrial flutter s/p ablation 02/2020 on Eliquis, RBBB, CHF with LV EF 60 -65% per ECHO 11/2019. He was recently seen by his EP cardiologist and he was in a NSR with a normal functioning pacemaker. He was seen by his CV surgeon, stable 1 year post bioprosthetic AVR. He was last seen by his general cardiologist 03/2020 with complaints of dizziness thought not to be cardiac etiology. He was due to see Dr. Burt Knack 09/2020 which has not been done ( patient has an appointment scheduled with Dr. Burt Knack 01/2021). -Cardiac clearance (prior to proceeding with a colonoscopy) due to significant cardiac history, patient is past due to see Dr. Burt Knack  3) Dizziness, double vision. Head CT 03/2020 showed Chronic lacunar infarcts within the right basal ganglia and possibly also within the left thalamus. Patient reports having ongoing dizziness with episodes of double vision which occurs approximately once every 2 days and sometimes occurs while driving.  -I advised the patient not to drive unless cleared by neurology -Neuro clearance required prior to proceeding with a colonoscopy with sedation/MAC -Note, patient is considering Neuro 2nd opinion, patient to discuss further with PCP  4) Iron deficiency without anemia, resolved. Labs 06/2020 showed Iron 42. Iron saturation 13. Hg 15.8. Repeat labs 10/2020 showed iron level 91. Iron saturation 25%. No overt GI bleeding. No GERD symptoms.    5) Thrombocytopenia, unclear etiology. No history of liver disease. Normal LFTs. No history of alcohol use disorder.  -Eventual abdominal sonogram to assess the liver and spleen      CC:  Jefm Petty, MD

## 2020-11-20 ENCOUNTER — Telehealth: Payer: Self-pay

## 2020-11-20 ENCOUNTER — Encounter: Payer: Self-pay | Admitting: Nurse Practitioner

## 2020-11-20 ENCOUNTER — Ambulatory Visit: Payer: Medicare Other | Admitting: Nurse Practitioner

## 2020-11-20 VITALS — BP 140/90 | HR 75 | Ht 68.5 in | Wt 165.2 lb

## 2020-11-20 DIAGNOSIS — D6869 Other thrombophilia: Secondary | ICD-10-CM | POA: Diagnosis not present

## 2020-11-20 DIAGNOSIS — I483 Typical atrial flutter: Secondary | ICD-10-CM | POA: Diagnosis not present

## 2020-11-20 DIAGNOSIS — I4891 Unspecified atrial fibrillation: Secondary | ICD-10-CM

## 2020-11-20 DIAGNOSIS — Z86010 Personal history of colonic polyps: Secondary | ICD-10-CM

## 2020-11-20 DIAGNOSIS — I4819 Other persistent atrial fibrillation: Secondary | ICD-10-CM | POA: Diagnosis not present

## 2020-11-20 NOTE — Telephone Encounter (Signed)
Needs to be cleared for colonoscopy.  Spoke with the patient and let him know to call [facility] and have them send a clearance to our fax line [phone].  He was seen by Dr. Rayann Heman in August and should be able to get him cleared.  He verbalized understanding and was appreciative of my call.

## 2020-11-20 NOTE — Patient Instructions (Signed)
If you are age 72 or older, your body mass index should be between 23-30. Your Body mass index is 24.76 kg/m. If this is out of the aforementioned range listed, please consider follow up with your Primary Care Provider.  The [facility] providers would like to encourage you to use [facility] to communicate with providers for non-urgent requests or questions.  Due to long hold times on the telephone, sending your provider a message by [facility] may be faster and more efficient way to get a response. Please allow 48 business hours for a response.  Please remember that this is for non-urgent requests/questions.  Please obtain Neurology clearance before we can schedule your colonoscopy.  Contact your Cardiologist to schedule appointment for clearance regarding having your colonoscopy.  Please contact us once you have completed all required clearances.  It was great seeing you today! Thank you for entrusting me with your care and choosing [facility].  Noralyn Pick, CRNP

## 2020-11-20 NOTE — Telephone Encounter (Signed)
The patient left a voicemail stating he needs an earlier appointment with Dr. Burt Knack because he is going to need Cardiac clearance for a colonoscopy. He would like for someone to call him to reschedule his appointment. His phone number is [phone].

## 2020-11-21 NOTE — Progress Notes (Signed)
Agree with assessment with the following thoughts. Since his procedure was done, surveillance guidelines have changed. He had 3 small adenomas, can go 3 to 5 years before next colonoscopy. If not having any alarm symptoms, given his other medical problems, may be good to extend him to 5 years from his last exam, and then maybe his next colonoscopy would be his last. Last CBC and iron studies within normal limits. If he does wish to pursue colonoscopy now and not wait to the 5 year mark (which would be another 1.5 years or so), he should have cardiology clearance. Platelets mildly low. He had a CT scan abdomen last year showing a normal liver, unlikely cirrhosis.

## 2020-11-28 NOTE — Progress Notes (Signed)
Dr. Havery Moros, I called Jerry Foster and he gladly accepts a recall colonoscopy Jan. 2024.  Lauren please enter a new colonoscopy recall date Jan 2024. THX

## 2021-01-04 ENCOUNTER — Other Ambulatory Visit (HOSPITAL_COMMUNITY): Payer: Self-pay | Admitting: *Deleted

## 2021-01-04 MED ORDER — APIXABAN 5 MG PO TABS: ORAL_TABLET | ORAL | 6 refills | Status: DC

## 2021-01-26 ENCOUNTER — Ambulatory Visit (INDEPENDENT_AMBULATORY_CARE_PROVIDER_SITE_OTHER): Payer: Medicare Other

## 2021-01-26 DIAGNOSIS — I442 Atrioventricular block, complete: Secondary | ICD-10-CM

## 2021-01-26 LAB — CUP PACEART REMOTE DEVICE CHECK
Battery Remaining Longevity: 118 mo
Battery Remaining Percentage: 89 %
Battery Voltage: 3.02 V
Brady Statistic AP VP Percent: 1 %
Brady Statistic AP VS Percent: 14 %
Brady Statistic AS VP Percent: 1 %
Brady Statistic AS VS Percent: 86 %
Brady Statistic RA Percent Paced: 14 %
Brady Statistic RV Percent Paced: 1 %
Date Time Interrogation Session: 20221104020013
Implantable Lead Implant Date: 20210506
Implantable Lead Implant Date: 20210506
Implantable Lead Location: 753859
Implantable Lead Location: 753860
Implantable Pulse Generator Implant Date: 20210506
Lead Channel Impedance Value: 450 Ohm
Lead Channel Impedance Value: 490 Ohm
Lead Channel Pacing Threshold Amplitude: 0.625 V
Lead Channel Pacing Threshold Amplitude: 0.875 V
Lead Channel Pacing Threshold Pulse Width: 0.4 ms
Lead Channel Pacing Threshold Pulse Width: 0.4 ms
Lead Channel Sensing Intrinsic Amplitude: 3.8 mV
Lead Channel Sensing Intrinsic Amplitude: 5.2 mV
Lead Channel Setting Pacing Amplitude: 1.125
Lead Channel Setting Pacing Amplitude: 1.625
Lead Channel Setting Pacing Pulse Width: 0.4 ms
Lead Channel Setting Sensing Sensitivity: 0.7 mV
Pulse Gen Model: 2272
Pulse Gen Serial Number: 3825759

## 2021-01-31 NOTE — Progress Notes (Signed)
Remote pacemaker transmission.   

## 2021-02-20 ENCOUNTER — Ambulatory Visit: Payer: Medicare Other | Admitting: Cardiovascular Disease

## 2021-02-21 ENCOUNTER — Encounter: Payer: Self-pay | Admitting: Cardiovascular Disease

## 2021-02-27 ENCOUNTER — Ambulatory Visit: Payer: Medicare Other | Admitting: Cardiovascular Disease

## 2021-02-27 ENCOUNTER — Encounter: Payer: Self-pay | Admitting: Cardiovascular Disease

## 2021-02-27 ENCOUNTER — Other Ambulatory Visit: Payer: Self-pay

## 2021-02-27 VITALS — BP 130/88 | HR 74 | Ht 69.0 in | Wt 165.0 lb

## 2021-02-27 DIAGNOSIS — E782 Mixed hyperlipidemia: Secondary | ICD-10-CM

## 2021-02-27 DIAGNOSIS — I480 Paroxysmal atrial fibrillation: Secondary | ICD-10-CM

## 2021-02-27 DIAGNOSIS — I350 Nonrheumatic aortic (valve) stenosis: Secondary | ICD-10-CM | POA: Diagnosis not present

## 2021-02-27 NOTE — Patient Instructions (Signed)
Medication Instructions:  Your physician recommends that you continue on your current medications as directed. Please refer to the Current Medication list given to you today.  *If you need a refill on your cardiac medications before your next appointment, please call your pharmacy*  Follow-Up: At [facility], you and your health needs are our priority.  As part of our continuing mission to provide you with exceptional heart care, we have created designated Provider Care Teams.  These Care Teams include your primary Cardiologist (physician) and Advanced Practice Providers (APPs -  Physician Assistants and Nurse Practitioners) who all work together to provide you with the care you need, when you need it.  Your next appointment:   1 year(s)  The format for your next appointment:   In Person  Provider:   Sherren Mocha, MD

## 2021-02-27 NOTE — Progress Notes (Signed)
Cardiology Office Note:    Date:  02/27/2021   ID:  Jerry Foster, DOB 1950-06-29, MRN [id number]  PCP:  Jefm Petty, MD   [facility] Providers Cardiologist:  Sherren Mocha, MD Electrophysiologist:  [name], MD     Referring MD: Jefm Petty, MD   Chief Complaint  Patient presents with   Follow-up    Aortic valve disease    History of Present Illness:    Jerry Foster is a 70 y.o. male with a hx of severe aortic stenosis status post bioprosthetic aortic valve replacement in 2021.  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  He has developed persistent atrial fibrillation and flutter.  The patient also had postoperative atrial fibrillation and was on amiodarone and warfarin for a few months after surgery.  After this was discontinued, he developed recurrent atrial fibrillation identified on pacemaker interrogations.  He ultimately underwent atrial fibrillation ablation 03/15/2020.   The patient is here alone today.  He is doing well from a cardiac perspective.  He denies chest pain, chest pressure, shortness of breath, or heart palpitations.  He is tolerating oral anticoagulation without bleeding problems.  He does have some gait instability and occasional dizziness.  This has been chronic over the past 1 to 2 years and he is seeing neurology for this.  No other complaints.  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   21 mm Edwards Inspiris  Resilia stented bovine pericardial tissue valve via right mini thoracotomy approach   Sensorineural hearing loss of both ears    Thrombosed hemorrhoids    hsitory of   Tubular adenoma of colon    Vitamin D deficiency     Past Surgical History:  Procedure Laterality Date   AORTIC VALVE REPLACEMENT N/A 07/23/2019   Procedure: MINIMALLY INVASIVE AORTIC VALVE REPLACEMENT (AVR) using Margaretha Sheffield Resilia 21 MM Aortic Valve.;  Surgeon: Rexene Alberts, MD;  Location: [place];  Service: Open Heart Surgery;  Laterality: N/A;   ATRIAL FIBRILLATION ABLATION N/A 03/15/2020   Procedure: ATRIAL FIBRILLATION ABLATION;  Surgeon: [name], MD;  Location: [place] [facility];  Service: Cardiovascular;  Laterality: N/A;   CARDIAC CATHETERIZATION     COLON SURGERY  2006   1 foot ascending colon removed    COLONOSCOPY     HERNIA REPAIR     PACEMAKER IMPLANT N/A 07/29/2019   Procedure: PACEMAKER IMPLANT;  Surgeon: [name], MD;  Location: [place] [facility];  Service: Cardiovascular;  Laterality: N/A;   POLYPECTOMY     RIGHT HEART CATH AND CORONARY ANGIOGRAPHY N/A 06/08/2019   Procedure: RIGHT HEART CATH AND CORONARY ANGIOGRAPHY;  Surgeon: Sherren Mocha, MD;  Location: [place] [facility];  Service: Cardiovascular;  Laterality: N/A;   TEE WITHOUT CARDIOVERSION N/A 07/23/2019   Procedure: TRANSESOPHAGEAL ECHOCARDIOGRAM (TEE);  Surgeon: Rexene Alberts, MD;  Location: [place];  Service: Open  Heart Surgery;  Laterality: N/A;   TESTICLE SURGERY      Current Medications: Current Meds  Medication Sig   acetaminophen (TYLENOL) 500 MG tablet Take 1,000 mg by mouth every 8 (eight) hours as needed for moderate pain or headache.   amoxicillin (AMOXIL) 500 MG capsule as directed. For dental appts   apixaban (ELIQUIS) 5 MG TABS tablet TAKE 1 TABLET(5 MG) BY MOUTH TWICE DAILY   Calcium Citrate-Vitamin D (CITRACAL + D PO) Take 2 tablets by mouth in the morning and at bedtime.   Cholecalciferol (DIALYVITE  VITAMIN D 5000) 125 MCG (5000 UT) capsule Take 5,000 Units by mouth daily.   ibuprofen (ADVIL) 200 MG tablet Take 400 mg by mouth every 6 (six) hours as needed for moderate pain.    Omega-3 1000 MG CAPS Take by mouth daily at 6 (six) AM.   pravastatin (PRAVACHOL) 40 MG tablet Take 40 mg by mouth at bedtime.     Allergies:   Lamisil [terbinafine]   Social History   Socioeconomic History   Marital status: Single    Spouse name: Not on file   Number of children: Not on file   Years of education: Not on file   Highest education level: Professional school degree (e.g., MD, DDS, DVM, JD)  Occupational History   Occupation: Retired Pharmacist, community  Tobacco Use   Smoking status: Never   Smokeless tobacco: Never  Vaping Use   Vaping Use: Never used  Substance and Sexual Activity   Alcohol use: Yes    Comment: Moderately, 2 bottles wine per week   Drug use: No   Sexual activity: Not on file  Other Topics Concern   Not on file  Social History Narrative   Not on file   Social Determinants of Health   Financial Resource Strain: Not on file  Food Insecurity: Not on file  Transportation Needs: Not on file  Physical Activity: Not on file  Stress: Not on file  Social Connections: Not on file     Family History: The patient's family history includes Heart disease in his maternal uncle; Hypertension in his brother. There is no history of Colon cancer, Colon polyps, Esophageal cancer, Rectal cancer, or Stomach cancer.  ROS:   Please see the history of present illness.    All other systems reviewed and are negative.  EKGs/Labs/Other Studies Reviewed:    The following studies were reviewed today: Echo 12/08/2019: 1. Left ventricular ejection fraction, by estimation, is 60 to 65%. The  left ventricle has normal function. The left ventricle has no regional  wall motion abnormalities. There is moderate left ventricular hypertrophy.  Left ventricular diastolic  parameters are consistent with  Grade I diastolic dysfunction (impaired  relaxation).   2. Right ventricular systolic function is normal. The right ventricular  size is normal.   3. The aortic valve has been repaired/replaced. There is a 21 mm Edwards  Inspiris Resilia valve present in the aortic position. Echo findings are  consistent with normal structure and function of the aortic valve  prosthesis. Aortic valve area, by VTI  measures 1.23 cm. Aortic valve mean gradient measures 15.0 mmHg. Aortic  valve Vmax measures 2.71 m/s. Peak gradient 29 mmHg. DI 0.36.   4. The inferior vena cava is dilated in size with >50% respiratory  variability, suggesting right atrial pressure of 8 mmHg.   Comparison(s): Changes from prior study are noted. 09/14/2019: LVEF 50-55%,  21 mm Edwards Inspiris Resilia valve, mean gradient 8.5 mmHg.  EKG:  EKG is not ordered today.    Recent Labs: No results found for requested labs within last 8760 hours.  Recent Lipid Panel No results found for: CHOL, TRIG, HDL, CHOLHDL, VLDL, LDLCALC, LDLDIRECT   Risk Assessment/Calculations:    CHA2DS2-VASc Score = 2   This indicates a 2.2% annual risk of stroke. The patient's score is based upon: CHF History: 0 HTN History: 1 Diabetes History: 0 Stroke History: 0 Vascular Disease History: 0 Age Score: 1 Gender Score: 0         Physical Exam:    VS:  BP 130/88   Pulse 74   Ht 5\' 9"  (1.753 m)   Wt 165 lb (74.8 kg)   SpO2 95%   BMI 24.37 kg/m     Wt Readings from Last 3 Encounters:  02/27/21 165 lb (74.8 kg)  11/20/20 165 lb 4 oz (75 kg)  11/01/20 165 lb (74.8 kg)     GEN:  Well nourished, well developed in no acute distress HEENT: Normal NECK: No JVD; No carotid bruits LYMPHATICS: No lymphadenopathy CARDIAC: RRR, 2/6 SEM at the LLSB/RUSB RESPIRATORY:  Clear to auscultation without rales, wheezing or rhonchi  ABDOMEN: Soft, non-tender, non-distended MUSCULOSKELETAL:  No edema; No deformity  SKIN: Warm and dry NEUROLOGIC:   Alert and oriented x 3 PSYCHIATRIC:  Normal affect   ASSESSMENT:    1. Paroxysmal atrial fibrillation (HCC)   2. Nonrheumatic aortic valve stenosis   3. Mixed hyperlipidemia    PLAN:    In order of problems listed above:  Appears stable.  Low atrial fibrillation burden.  Has been followed regularly by Dr. Rayann Heman and I reviewed his notes.  He is tolerating apixaban without bleeding problems. The patient is status post bioprosthetic aortic valve replacement with normal aortic valve function postoperatively.  I reviewed his most recent echo as outlined above.  I stressed the importance of SBE prophylaxis with the patient today.  He will continue on apixaban for oral anticoagulation in the setting of atrial fibrillation. Treated with pravastatin.  LFTs reviewed through his primary care doctor's office and these are within normal limits.  I reviewed his most recent lipids through care everywhere.           Medication Adjustments/Labs and Tests Ordered: Current medicines are reviewed at length with the patient today.  Concerns regarding medicines are outlined above.  No orders of the defined types were placed in this encounter.  No orders of the defined types were placed in this encounter.   Patient Instructions  Medication Instructions:  Your physician recommends that you continue on your current medications as directed. Please refer to the Current Medication list given to you today.  *If you need a refill on your cardiac medications before your next appointment, please call your pharmacy*  Follow-Up: At [facility], you and your health needs are our priority.  As part of our continuing mission to provide you with exceptional heart care, we have created designated Provider Care Teams.  These Care Teams include your primary Cardiologist (physician) and Advanced Practice Providers (APPs -  Physician Assistants and Nurse Practitioners) who all work together to provide you with the care  you need, when you need it.  Your next appointment:   1 year(s)  The format for your next appointment:   In Person  Provider:   Sherren Mocha, MD      Signed, Sherren Mocha, MD  02/27/2021 1:59 PM    [place]

## 2021-03-12 ENCOUNTER — Ambulatory Visit: Payer: Medicare Other | Admitting: Neurology

## 2021-03-20 ENCOUNTER — Ambulatory Visit: Payer: Medicare Other | Admitting: Neurology

## 2021-03-20 ENCOUNTER — Encounter: Payer: Self-pay | Admitting: Neurology

## 2021-03-20 VITALS — BP 122/80 | HR 70 | Ht 69.0 in | Wt 170.5 lb

## 2021-03-20 DIAGNOSIS — R0683 Snoring: Secondary | ICD-10-CM

## 2021-03-20 DIAGNOSIS — R42 Dizziness and giddiness: Secondary | ICD-10-CM | POA: Diagnosis not present

## 2021-03-20 DIAGNOSIS — G4719 Other hypersomnia: Secondary | ICD-10-CM | POA: Diagnosis not present

## 2021-03-20 DIAGNOSIS — R413 Other amnesia: Secondary | ICD-10-CM

## 2021-03-20 HISTORY — DX: Snoring: R06.83

## 2021-03-20 NOTE — Progress Notes (Signed)
[facility]  PATIENT: Jerry Foster DOB: [birth year]  REFERRING DOCTOR OR PCP: Jefm Petty, MD SOURCE: Patient, notes from primary care, laboratory reports.  _________________________________   HISTORICAL  CHIEF COMPLAINT:  Chief Complaint  Patient presents with   New Patient (Initial Visit)    Rm 2, alone. Here for memory loss and balance disorder. Pt reports some mental changes, forgetfulness, more prevalent last 6 months. Balance concerns when walking. Aug 12, 2019 pt had a valve replacement and vertigo started around then.     HISTORY OF PRESENT ILLNESS:  I had the pleasure of seeing your patient, Jerry Foster, at [facility] for neurologic consultation regarding his memory loss and episodes of dizziness.  He is a 70 year old man who has notd difficulty with memeory over the past 6 months.    People around him are also noticing difficulty.    Besides reduced STM, he has had difficulty remembering small events from the last few days.   He needs to write things down more.  He has more trouble getting names out sometimes.   He always does better with hints.    He scored 27/30 on the MoCA (lost 2 points for recall but was 5/5 with hints).      He has had mild balance issues.   Sometimes he is wobbly and is afraid he will fall.   These spells last 90 seconds and he tries to sit or lay down until it passes.    Sometimes he feels mildly wobbly.   He has a cane but is better so does not need it now.      He had a AVR in 07/2019 followed by cardiac arrest and pacemaker placement.  He developed AFib and had ablation January 2022 with good results.   Physically he has done much better since then.       Montreal Cognitive Assessment  03/20/2021  Visuospatial/ Executive (0/5) 4  Naming (0/3) 3  Attention: Read list of digits (0/2) 2  Attention: Read list of letters (0/1) 1  Attention: Serial 7 subtraction starting at 100 (0/3) 3  Language: Repeat  phrase (0/2) 2  Language : Fluency (0/1) 1  Abstraction (0/2) 2  Delayed Recall (0/5) 3  Orientation (0/6) 6  Total 27   EPWORTH SLEEPINESS SCALE  On a scale of 0 - 3 what is the chance of dozing:  Sitting and Reading:   1 Watching TV:    2 Sitting inactive in a public place: 0 Passenger in car for one hour: 3 Lying down to rest in the afternoon: 3 Sitting and talking to someone: 0 Sitting quietly after lunch:  3 In a car, stopped in traffic:  0  Total (out of 24):   12/24  He occasionally feels mildly down but is not depressed.   He is extroverted with a good network of friends   He has had 2 husband who passed away .    He sleeps well, going to bed at 11 pm and waking up 430 to 7 am.   He does not wake up during the night.    He snores but is not sure if any OSA signs.      His mother and several aunts and maternal GM had Alzheimer.   His mother started having issues at age 51 while others were in 64's and 61's Is father lived to late 42's with no cognitive issues.     Data: I personally reviewed the  CT scan from 07/28/2019.  It shows mild generalized cortical atrophy that is typical for age.  There do not appear to be any large vessel strokes or acute findings in the brain.  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  TSH, B12, Vit D and HIV were all normal or negative between 06/2020 and 11/2020.       REVIEW OF SYSTEMS: Constitutional: No fevers, chills, sweats, or change in appetite Eyes: No visual changes, double vision, eye pain Ear, nose and throat: No hearing loss, ear pain, nasal congestion, sore throat Cardiovascular: No chest pain, palpitations Respiratory:  No shortness of breath at rest or with exertion.   No wheezes GastrointestinaI: No nausea, vomiting, diarrhea, abdominal pain, fecal incontinence Genitourinary:  No dysuria, urinary retention or frequency.  No nocturia. Musculoskeletal:  No neck  pain, back pain Integumentary: No rash, pruritus, skin lesions Neurological: as above Psychiatric: No depression at this time.  No anxiety Endocrine: No palpitations, diaphoresis, change in appetite, change in weigh or increased thirst Hematologic/Lymphatic:  No anemia, purpura, petechiae. Allergic/Immunologic: No itchy/runny eyes, nasal congestion, recent allergic reactions, rashes  ALLERGIES: Allergies  Allergen Reactions   Lamisil [Terbinafine]     Elevated liver enzymes (LFTs)    HOME MEDICATIONS:  Current Outpatient Medications:    acetaminophen (TYLENOL) 500 MG tablet, Take 1,000 mg by mouth every 8 (eight) hours as needed for moderate pain or headache., Disp: , Rfl:    amoxicillin (AMOXIL) 500 MG capsule, as directed. For dental appts, Disp: , Rfl:    apixaban (ELIQUIS) 5 MG TABS tablet, TAKE 1 TABLET(5 MG) BY MOUTH TWICE DAILY, Disp: 60 tablet, Rfl: 6   Calcium Citrate-Vitamin D (CITRACAL + D PO), Take 2 tablets by mouth in the morning and at bedtime., Disp: , Rfl:    Cholecalciferol (DIALYVITE VITAMIN D 5000) 125 MCG (5000 UT) capsule, Take 5,000 Units by mouth daily., Disp: , Rfl:    ibuprofen (ADVIL) 200 MG tablet, Take 400 mg by mouth every 6 (six) hours as needed for moderate pain. , Disp: , Rfl:    Omega-3 1000 MG CAPS, Take by mouth daily at 6 (six) AM., Disp: , Rfl:    pravastatin (PRAVACHOL) 40 MG tablet, Take 40 mg by mouth at bedtime., Disp: , Rfl:   PAST MEDICAL HISTORY: Past Medical History:  Diagnosis Date   Aortic stenosis    Benign prostatic hyperplasia    Bronchitis    Cataract    bilateral    CHF (congestive heart failure) (HCC)    Colon polyps    Complete heart block ([place]) 07/28/2019   Heart murmur    History of fracture of vertebral column    Hyperlipidemia    Kidney stone    Nonrheumatic aortic valve stenosis    Osteoporosis    Osteoporosis    Pure hypercholesterolemia    RBBB    S/P minimally-invasive aortic valve replacement with  bioprosthetic valve 07/23/2019   21 mm Edwards Inspiris Resilia stented bovine pericardial tissue valve via right mini thoracotomy approach   Sensorineural hearing loss of both ears    Thrombosed hemorrhoids    hsitory of   Tubular adenoma of colon    Vitamin D deficiency     PAST SURGICAL HISTORY: Past Surgical History:  Procedure Laterality Date   AORTIC VALVE REPLACEMENT N/A 07/23/2019   Procedure: MINIMALLY INVASIVE AORTIC VALVE REPLACEMENT (AVR) using Edwards INSPIRIS Resilia 21 MM Aortic Valve.;  Surgeon: Rexene Alberts, MD;  Location: [place];  Service: Open Heart Surgery;  Laterality: N/A;   ATRIAL FIBRILLATION ABLATION N/A 03/15/2020   Procedure: ATRIAL FIBRILLATION ABLATION;  Surgeon: [name], MD;  Location: [place] [facility];  Service: Cardiovascular;  Laterality: N/A;   CARDIAC CATHETERIZATION     COLON SURGERY  2006   1 foot ascending colon removed    COLONOSCOPY     HERNIA REPAIR     PACEMAKER IMPLANT N/A 07/29/2019   Procedure: PACEMAKER IMPLANT;  Surgeon: [name], MD;  Location: [place] [facility];  Service: Cardiovascular;  Laterality: N/A;   POLYPECTOMY     RIGHT HEART CATH AND CORONARY ANGIOGRAPHY N/A 06/08/2019   Procedure: RIGHT HEART CATH AND CORONARY ANGIOGRAPHY;  Surgeon: Sherren Mocha, MD;  Location: [place] [facility];  Service: Cardiovascular;  Laterality: N/A;   TEE WITHOUT CARDIOVERSION N/A 07/23/2019   Procedure: TRANSESOPHAGEAL ECHOCARDIOGRAM (TEE);  Surgeon: Rexene Alberts, MD;  Location: [place];  Service: Open Heart Surgery;  Laterality: N/A;   TESTICLE SURGERY      FAMILY HISTORY: Family History  Problem Relation Age of Onset   Heart disease Maternal Uncle    Hypertension Brother    Colon cancer Neg Hx    Colon polyps Neg Hx    Esophageal cancer Neg Hx    Rectal cancer Neg Hx    Stomach cancer Neg Hx     SOCIAL HISTORY:  Social History   Socioeconomic History   Marital status: Single    Spouse name: Not on file   Number of  children: Not on file   Years of education: Not on file   Highest education level: Professional school degree (e.g., MD, DDS, DVM, JD)  Occupational History   Occupation: Retired Pharmacist, community  Tobacco Use   Smoking status: Never   Smokeless tobacco: Never  Vaping Use   Vaping Use: Never used  Substance and Sexual Activity   Alcohol use: Yes    Comment: Moderately, 2 bottles wine per week   Drug use: No   Sexual activity: Not on file  Other Topics Concern   Not on file  Social History Narrative   Lives alone, currently has a friend living with him til around April   Left handed   Caffeine: 5 cups of coffee in the AM   Social Determinants of Health   Financial Resource Strain: Not on file  Food Insecurity: Not on file  Transportation Needs: Not on file  Physical Activity: Not on file  Stress: Not on file  Social Connections: Not on file  Intimate Partner Violence: Not on file     PHYSICAL EXAM  Vitals:   03/20/21 0842  BP: 122/80  Pulse: 70  Weight: 170 lb 8 oz (77.3 kg)  Height: 5\' 9"  (1.753 m)    Body mass index is 25.18 kg/m.   General: The patient is well-developed and well-nourished and in no acute distress  HEENT:  Head is [place]/AT.  Sclera are anicteric.    Neck: No carotid bruits are noted.  The neck is nontender.  Cardiovascular:  The heart has a regular rate and rhythm with a normal S1 and S2. There was a systolic ejection murmur.  No gallops or rubs.    Skin: Extremities are without rash or  edema.  Musculoskeletal:  Back is nontender  Neurologic Exam  Mental status: The patient is alert and oriented x 3 at the time of the examination. The patient has mildly  reduced  recent and mildly reduced remote memory (normal with hints), with an apparently normal attention span and concentration ability.   Speech is normal.  Cranial nerves: Extraocular movements are full. Pupils are equal, round, and reactive to light and accomodation.  He has mild ptosis on the  right.  There is good facial sensation to soft touch bilaterally.Facial strength is normal.  Trapezius and sternocleidomastoid strength is normal. No dysarthria is noted.  The tongue is midline, and the patient has symmetric elevation of the soft palate. No obvious hearing deficits are noted.  Motor:  Muscle bulk is normal.   Tone is normal. Strength is  5 / 5 in all 4 extremities.   Sensory: Sensory testing is intact to pinprick, soft touch and vibration sensation in the arms and slightly reduced vibration in the feet.  Coordination: Cerebellar testing reveals good finger-nose-finger and heel-to-shin bilaterally.  Gait and station: Station is normal.   Gait is normal. Tandem gait is normal for age. Romberg is negative.   Reflexes: Deep tendon reflexes are symmetric and normal bilaterally.   Plantar responses are flexor.    DIAGNOSTIC DATA (LABS, IMAGING, TESTING) - I reviewed patient records, labs, notes, testing and imaging myself where available.  Lab Results  Component Value Date   WBC 7.7 02/22/2020   HGB 15.4 02/22/2020   HCT 44.6 02/22/2020   MCV 90 02/22/2020   PLT 140 (L) 02/22/2020      Component Value Date/Time   NA 139 02/22/2020 1527   K 4.2 02/22/2020 1527   CL 102 02/22/2020 1527   CO2 23 02/22/2020 1527   GLUCOSE 99 02/22/2020 1527   GLUCOSE 119 (H) 07/31/2019 0425   BUN 21 02/22/2020 1527   CREATININE 0.90 02/22/2020 1527   CALCIUM 9.5 02/22/2020 1527   PROT 6.7 09/07/2019 0931   ALBUMIN 4.0 09/07/2019 0931   AST 33 09/07/2019 0931   ALT 27 09/07/2019 0931   ALKPHOS 73 09/07/2019 0931   BILITOT 0.4 09/07/2019 0931   GFRNONAA 86 02/22/2020 1527   GFRAA 99 02/22/2020 1527   Lab Results  Component Value Date   HGBA1C 5.7 (H) 07/19/2019   No results found for: VITAMINB12 Lab Results  Component Value Date   TSH 1.150 12/24/2019       ASSESSMENT AND PLAN  Memory loss - Plan: CT HEAD WO CONTRAST (5MM)  Snoring - Plan: Home sleep  test  Excessive daytime sleepiness - Plan: Home sleep test  Dizziness - Plan: CT HEAD WO CONTRAST (5MM)   In summary, Mr. Kidd is a 70 year old man who has noted difficulties with memory this past year.  He scored 27/30 on the [facility] cognitive assessment placing him within the normal range.  He lost 2 points for short-term recall but, with hints, he was able to quickly remember those 2 words as well.  The MRI of the brain showed expected age-related mild generalized cortical atrophy but no concerning pattern but no concerning pattern.    I discussed with him that I do not think he has a neurodegenerative process causing his mild cognitive issues --his score on the [facility] cognitive assessment test was in the normal range and I am more concerned that he has reduced focus/attention causing retrieval difficulty with memory function.  He did not have other medications that might affect focus and his cardiac and other medical issues seem well controlled.  He does not have depression.  He does snore and has mild excessive daytime sleepiness.  Therefore, we  will check a home sleep study to determine if he has sleep apnea that might be playing a role in his memory issues.  Additionally, since issues started after his heart surgery we will check a CAT scan to determine if there has been any strokes and also to see if there has been any change in atrophy compared to his previous CT.  The second issue is the spells of poor balance lasting 90 seconds or so.  These are occurring very rarely now, less than once a month.  Therefore, we will hold off on additional evaluation or treatment at this time.  If these become more frequent again consider additional evaluation.  He will return to see me in 6 months for regular scheduled visit or call sooner for new or worsening neurologic symptoms.  Thank you for asking me to see Mr. Corniel.  Please let me know if I can be of further assistance with him or other patients  in the future    Jenniger Figiel A. Felecia Shelling, MD, [facility] 67/59/1638, 4:66 AM Certified in Neurology, Clinical Neurophysiology, Sleep Medicine and Neuroimaging  [facility] [address], [place], [place] [id number] [phone]

## 2021-04-12 ENCOUNTER — Other Ambulatory Visit: Payer: Self-pay

## 2021-04-12 ENCOUNTER — Ambulatory Visit
Admission: RE | Admit: 2021-04-12 | Discharge: 2021-04-12 | Disposition: A | Discharge status: Home / Self Care | Payer: Medicare Other | Source: Ambulatory Visit | Attending: Neurology | Admitting: Neurology

## 2021-04-12 DIAGNOSIS — R413 Other amnesia: Secondary | ICD-10-CM | POA: Diagnosis not present

## 2021-04-12 DIAGNOSIS — R42 Dizziness and giddiness: Secondary | ICD-10-CM

## 2021-04-18 ENCOUNTER — Ambulatory Visit (INDEPENDENT_AMBULATORY_CARE_PROVIDER_SITE_OTHER): Payer: Medicare Other | Admitting: Neurology

## 2021-04-18 DIAGNOSIS — G4719 Other hypersomnia: Secondary | ICD-10-CM

## 2021-04-18 DIAGNOSIS — R0683 Snoring: Secondary | ICD-10-CM

## 2021-04-18 DIAGNOSIS — G4733 Obstructive sleep apnea (adult) (pediatric): Secondary | ICD-10-CM | POA: Diagnosis not present

## 2021-04-19 NOTE — Progress Notes (Signed)
° °  [facility]  HOME SLEEP STUDY  STUDY DATE: 04/18/2021 PATIENT NAME: Jerry Foster DOB: October 07, 1950 MRN: [id number]  ORDERING CLINICIAN: Richard A. Felecia Shelling, MD, PhD REFERRING CLINICIAN: Richard A. Felecia Shelling, MD. PhD   CLINICAL INFORMATION: 71 year old man with memory loss, snoring and excessive daytime sleepiness.  FINDINGS:  Total Record Time: 6 hours 32 minutes Total Sleep Time:  5 hours 47 minutes  Percent REM:   20.4   Calc. pAHI 3%:  8.3/h      REM pAHI:    10.1/h NREM pAHI: 7.7/h  ODI 4%:  4.5/h   Pulse Mean:    62 bpm pulse Range (43 to 86 bpm)    Oxygen Sat% Mean: 95%  O2Sat Range (83-98%)  O2Sat <88%: 0.8 minutes    IMPRESSION:  This home sleep study shows the following: Mild obstructive sleep apnea with a pAHI of 8.3/h.  However, the 4% ODI was only 4.5 (minimal) Mild excessive daytime sleepiness with an Epworth of 12/24   RECOMMENDATION: As the OSA was minimal to mild, CPAP is not indicated.  Consider treatment with an oral appliance. Current weight is 170 pounds with a BMI of 25.1 so weight loss is unlikely to have much impact..  If weight increases and symptoms worsen consider retesting. Follow-up with Dr. Felecia Shelling  INTERPRETING PHYSICIAN:   Nanine Means. Felecia Shelling, MD, PhD, [facility] Certified in Neurology, Clinical Neurophysiology, Sleep Medicine, Pain Medicine and Neuroimaging  [facility] [address], [place], [place] [address] [phone]

## 2021-04-20 ENCOUNTER — Encounter: Payer: Self-pay | Admitting: Neurology

## 2021-05-04 ENCOUNTER — Ambulatory Visit (INDEPENDENT_AMBULATORY_CARE_PROVIDER_SITE_OTHER): Payer: Medicare Other

## 2021-05-04 DIAGNOSIS — I442 Atrioventricular block, complete: Secondary | ICD-10-CM

## 2021-05-05 LAB — CUP PACEART REMOTE DEVICE CHECK
Battery Remaining Longevity: 113 mo
Battery Remaining Percentage: 87 %
Battery Voltage: 3.02 V
Brady Statistic AP VP Percent: 1 %
Brady Statistic AP VS Percent: 15 %
Brady Statistic AS VP Percent: 1 %
Brady Statistic AS VS Percent: 85 %
Brady Statistic RA Percent Paced: 14 %
Brady Statistic RV Percent Paced: 1 %
Date Time Interrogation Session: 20230209233738
Implantable Lead Implant Date: 20210506
Implantable Lead Implant Date: 20210506
Implantable Lead Location: 753859
Implantable Lead Location: 753860
Implantable Pulse Generator Implant Date: 20210506
Lead Channel Impedance Value: 440 Ohm
Lead Channel Impedance Value: 450 Ohm
Lead Channel Pacing Threshold Amplitude: 0.625 V
Lead Channel Pacing Threshold Amplitude: 0.875 V
Lead Channel Pacing Threshold Pulse Width: 0.4 ms
Lead Channel Pacing Threshold Pulse Width: 0.4 ms
Lead Channel Sensing Intrinsic Amplitude: 3.1 mV
Lead Channel Sensing Intrinsic Amplitude: 4.5 mV
Lead Channel Setting Pacing Amplitude: 1.125
Lead Channel Setting Pacing Amplitude: 1.625
Lead Channel Setting Pacing Pulse Width: 0.4 ms
Lead Channel Setting Sensing Sensitivity: 0.7 mV
Pulse Gen Model: 2272
Pulse Gen Serial Number: 3825759

## 2021-05-08 ENCOUNTER — Other Ambulatory Visit: Payer: Self-pay

## 2021-05-08 ENCOUNTER — Ambulatory Visit (HOSPITAL_COMMUNITY)
Admission: RE | Admit: 2021-05-08 | Discharge: 2021-05-08 | Disposition: A | Discharge status: Home / Self Care | Payer: Medicare Other | Source: Ambulatory Visit | Attending: Physician Assistant | Admitting: Physician Assistant

## 2021-05-08 ENCOUNTER — Encounter (HOSPITAL_COMMUNITY): Payer: Self-pay | Admitting: Physician Assistant

## 2021-05-08 VITALS — BP 150/90 | HR 76 | Ht 69.0 in | Wt 161.2 lb

## 2021-05-08 DIAGNOSIS — I495 Sick sinus syndrome: Secondary | ICD-10-CM | POA: Diagnosis not present

## 2021-05-08 DIAGNOSIS — Z7901 Long term (current) use of anticoagulants: Secondary | ICD-10-CM | POA: Diagnosis not present

## 2021-05-08 DIAGNOSIS — I2510 Atherosclerotic heart disease of native coronary artery without angina pectoris: Secondary | ICD-10-CM | POA: Insufficient documentation

## 2021-05-08 DIAGNOSIS — Z950 Presence of cardiac pacemaker: Secondary | ICD-10-CM | POA: Diagnosis not present

## 2021-05-08 DIAGNOSIS — I4892 Unspecified atrial flutter: Secondary | ICD-10-CM | POA: Insufficient documentation

## 2021-05-08 DIAGNOSIS — I350 Nonrheumatic aortic (valve) stenosis: Secondary | ICD-10-CM | POA: Insufficient documentation

## 2021-05-08 DIAGNOSIS — I4819 Other persistent atrial fibrillation: Secondary | ICD-10-CM | POA: Diagnosis present

## 2021-05-08 DIAGNOSIS — I4510 Unspecified right bundle-branch block: Secondary | ICD-10-CM | POA: Diagnosis not present

## 2021-05-08 DIAGNOSIS — D6869 Other thrombophilia: Secondary | ICD-10-CM | POA: Insufficient documentation

## 2021-05-08 NOTE — Progress Notes (Signed)
Primary Care Physician: Jefm Petty, MD Primary Cardiologist: Dr Burt Knack Primary Electrophysiologist: Dr Rayann Heman Referring Physician: Dr [name] Jerry Foster is a 71 y.o. male with a history of RBBB, AS s/p AVR, HLD, tachybradycardia syndrome, transient CHB s/p PPM, persistent atrial fibrillation, and atrial flutter who presents for follow up in the [facility]. The patient was admitted electively and on 07/23/2019 he was taken the operating room where he underwent AVR.  He did develop postoperative atrial fibrillation as well as significant heart block. On 07/28/2019 he had a fall in the bathroom that was felt to be related to the heart block and sustained a significant laceration in the region of his left eyebrow requiring plastic surgery repair. His CT scan was also done at that time and showed an avulsion fracture the anterior inferior aspect of the C6 vertebral body. He was placed in a soft cervical immobilizer. CT scan of the head was unremarkable. Neurosurgery will follow up as an outpatient. He was seen by Dr Rayann Heman and a PPM was implanted. He has been started on Coumadin with a CHADS2VASC score of 2. He was also started on amiodarone and converted to SR prior to discharge. His amiodarone and warfarin were eventually discontinued as he had no recurrence of afib.  Patient is s/p afib and flutter ablation with Dr Rayann Heman on 03/15/20.   On follow up today, patient reports that he has done well from a cardiac standpoint. His PPM shows 1% afib burden. He denies any bleeding issues on anticoagulation.   Today, he denies symptoms of palpitations, shortness of breath, orthopnea, PND, lower extremity edema, syncope, snoring, daytime somnolence, bleeding, or neurologic sequela. The patient is tolerating medications without difficulties and is otherwise without complaint today.    Atrial Fibrillation Risk Factors:  he does not have symptoms or diagnosis of sleep  apnea.   he has a BMI of Body mass index is 23.81 kg/m.[name] Filed Weights   05/08/21 0921  Weight: 73.1 kg    Family History  Problem Relation Age of Onset   Heart disease Maternal Uncle    Hypertension Brother    Colon cancer Neg Hx    Colon polyps Neg Hx    Esophageal cancer Neg Hx    Rectal cancer Neg Hx    Stomach cancer Neg Hx      Atrial Fibrillation Management history:  Previous antiarrhythmic drugs: amiodarone Previous cardioversions: none Previous ablations: 03/15/20 afib and flutter CHADS2VASC score: 2 Anticoagulation history: warfarin   Past Medical History:  Diagnosis Date   Aortic stenosis    Benign prostatic hyperplasia    Bronchitis    Cataract    bilateral    CHF (congestive heart failure) (HCC)    Colon polyps    Complete heart block ([place]) 07/28/2019   Heart murmur    History of fracture of vertebral column    Hyperlipidemia    Kidney stone    Nonrheumatic aortic valve stenosis    Osteoporosis    Osteoporosis    Pure hypercholesterolemia    RBBB    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   21 mm Edwards Inspiris Resilia stented bovine pericardial tissue valve via right mini thoracotomy approach   Sensorineural hearing loss of both ears    Thrombosed hemorrhoids    hsitory of   Tubular adenoma of colon    Vitamin D deficiency    Past Surgical History:  Procedure Laterality Date   AORTIC  VALVE REPLACEMENT N/A 07/23/2019   Procedure: MINIMALLY INVASIVE AORTIC VALVE REPLACEMENT (AVR) using Margaretha Sheffield Resilia 21 MM Aortic Valve.;  Surgeon: Rexene Alberts, MD;  Location: [facility];  Service: Open Heart Surgery;  Laterality: N/A;   ATRIAL FIBRILLATION ABLATION N/A 03/15/2020   Procedure: ATRIAL FIBRILLATION ABLATION;  Surgeon: [name], MD;  Location: [facility];  Service: Cardiovascular;  Laterality: N/A;   CARDIAC CATHETERIZATION     COLON SURGERY  2006   1 foot ascending colon removed     COLONOSCOPY     HERNIA REPAIR     PACEMAKER IMPLANT N/A 07/29/2019   Procedure: PACEMAKER IMPLANT;  Surgeon: [name], MD;  Location: [facility];  Service: Cardiovascular;  Laterality: N/A;   POLYPECTOMY     RIGHT HEART CATH AND CORONARY ANGIOGRAPHY N/A 06/08/2019   Procedure: RIGHT HEART CATH AND CORONARY ANGIOGRAPHY;  Surgeon: Sherren Mocha, MD;  Location: [facility];  Service: Cardiovascular;  Laterality: N/A;   TEE WITHOUT CARDIOVERSION N/A 07/23/2019   Procedure: TRANSESOPHAGEAL ECHOCARDIOGRAM (TEE);  Surgeon: Rexene Alberts, MD;  Location: [facility];  Service: Open Heart Surgery;  Laterality: N/A;   TESTICLE SURGERY      Current Outpatient Medications  Medication Sig Dispense Refill   acetaminophen (TYLENOL) 500 MG tablet Take 1,000 mg by mouth every 8 (eight) hours as needed for moderate pain or headache.     amoxicillin (AMOXIL) 500 MG capsule as directed. For dental appts     apixaban (ELIQUIS) 5 MG TABS tablet TAKE 1 TABLET(5 MG) BY MOUTH TWICE DAILY 60 tablet 6   Calcium Citrate-Vitamin D (CITRACAL + D PO) Take 2 tablets by mouth in the morning and at bedtime.     Cholecalciferol (DIALYVITE VITAMIN D 5000) 125 MCG (5000 UT) capsule Take 5,000 Units by mouth daily.     ibuprofen (ADVIL) 200 MG tablet Take 400 mg by mouth every 6 (six) hours as needed for moderate pain.      Omega-3 1000 MG CAPS Take by mouth daily at 6 (six) AM.     pravastatin (PRAVACHOL) 40 MG tablet Take 40 mg by mouth at bedtime.     No current facility-administered medications for this encounter.    Allergies  Allergen Reactions   Lamisil [Terbinafine]     Elevated liver enzymes (LFTs)    Social History   Socioeconomic History   Marital status: Single    Spouse name: Not on file   Number of children: Not on file   Years of education: Not on file   Highest education level: Professional school degree (e.g., MD, DDS, DVM, JD)  Occupational History   Occupation: Retired Pharmacist, community   Tobacco Use   Smoking status: Never   Smokeless tobacco: Never  Vaping Use   Vaping Use: Never used  Substance and Sexual Activity   Alcohol use: Yes    Alcohol/week: 6.0 - 8.0 standard drinks    Types: 6 - 8 Glasses of wine per week    Comment: 2 glass 3-4 times a week 05/08/21   Drug use: No   Sexual activity: Not on file  Other Topics Concern   Not on file  Social History Narrative   Lives alone, currently has a friend living with him til around April   Left handed   Caffeine: 5 cups of coffee in the AM   Social Determinants of Health   Financial Resource Strain: Not on file  Food Insecurity: Not on file  Transportation Needs: Not on file  Physical Activity: Not on file  Stress: Not on file  Social Connections: Not on file  Intimate Partner Violence: Not on file     ROS- All systems are reviewed and negative except as per the HPI above.  Physical Exam: Vitals:   05/08/21 0921  BP: (!) 150/90  Pulse: 76  Weight: 73.1 kg  Height: 5\' 9"  (1.753 m)    GEN- The patient is a well appearing male, alert and oriented x 3 today.   HEENT-head normocephalic, atraumatic, sclera clear, conjunctiva pink, hearing intact, trachea midline. Lungs- Clear to ausculation bilaterally, normal work of breathing Heart- Regular rate and rhythm, no murmurs, rubs or gallops  GI- soft, NT, ND, + BS Extremities- no clubbing, cyanosis, or edema MS- no significant deformity or atrophy Skin- no rash or lesion Psych- euthymic mood, full affect Neuro- strength and sensation are intact   Wt Readings from Last 3 Encounters:  05/08/21 73.1 kg  03/20/21 77.3 kg  02/27/21 74.8 kg    EKG today demonstrates  SR, RBBB Vent. rate 76 BPM PR interval 178 ms QRS duration 154 ms QT/QTcB 434/488 ms  Echo 12/08/19 demonstrated  1. Left ventricular ejection fraction, by estimation, is 60 to 65%. The  left ventricle has normal function. The left ventricle has no regional  wall motion  abnormalities. There is moderate left ventricular hypertrophy. Left ventricular diastolic parameters are consistent with Grade I diastolic dysfunction (impaired relaxation).   2. Right ventricular systolic function is normal. The right ventricular  size is normal.   3. The aortic valve has been repaired/replaced. There is a 21 mm Edwards Inspiris Resilia valve present in the aortic position. Echo findings are consistent with normal structure and function of the aortic valve prosthesis. Aortic valve area, by VTI measures 1.23 cm. Aortic valve mean gradient measures 15.0 mmHg. Aortic valve Vmax measures 2.71 m/s. Peak gradient 29 mmHg. DI 0.36.   4. The inferior vena cava is dilated in size with >50% respiratory  variability, suggesting right atrial pressure of 8 mmHg.   Comparison(s): Changes from prior study are noted. 09/14/2019: LVEF 50-55%, 21 mm Edwards Inspiris Resilia valve, mean gradient 8.5 mmHg.  Epic records are reviewed at length today  CHA2DS2-VASc Score = 2  The patient's score is based upon: CHF History: 0 HTN History: 1 Diabetes History: 0 Stroke History: 0 Vascular Disease History: 0 Age Score: 1 Gender Score: 0      ASSESSMENT AND PLAN: 1. Persistent Atrial Fibrillation/atrial flutter The patient's CHA2DS2-VASc score is 2, indicating a 2.2% annual risk of stroke.   S/p afib and flutter ablation 03/15/20 AF burden on device 1% Continue Eliquis 5 mg BID  2. Secondary Hypercoagulable State (ICD10:  D68.69) The patient is at significant risk for stroke/thromboembolism based upon his CHA2DS2-VASc Score of 2.  Continue Apixaban (Eliquis).   3. Transient CHB S/p PPM, followed by the device clinic.  4. Aortic stenosis S/p bioprosthetic AVR 06/2019.  5. CAD Nonobstructive by cath 05/2019 No anginal symptoms.  6. Elevated BP Better on recheck, has been well controlled at previous visits.  Will reassess at follow up, if it remains elevated, may need to start HTN  medications.    Follow up in the AF clinic in 6 months.    [facility] [address] [place], [place] [address] [phone] 05/08/2021 9:51 AM

## 2021-05-08 NOTE — Progress Notes (Signed)
Remote pacemaker transmission.   

## 2021-05-21 ENCOUNTER — Encounter: Payer: Self-pay | Admitting: Cardiovascular Disease

## 2021-05-21 DIAGNOSIS — I10 Essential (primary) hypertension: Secondary | ICD-10-CM

## 2021-05-21 NOTE — Telephone Encounter (Signed)
Jerry Foster - thx for your response to him. Once you hear back, I'd probably start him on amlodipine 5 mg daily. thx

## 2021-05-22 NOTE — Telephone Encounter (Signed)
Attempted to call patient this morning, no answer, left message to return call to office to review Cooper's recommendations.

## 2021-05-24 MED ORDER — AMLODIPINE BESYLATE 5 MG PO TABS: 5.0000 mg | ORAL_TABLET | Freq: Every day | ORAL | 3 refills | Status: DC

## 2021-05-29 NOTE — Telephone Encounter (Signed)
Will route to [place] for review. Pt has begun Amlodipine '5mg'$  QD, but still having consistent readings in upper 588'F-027'X systolic. ?

## 2021-05-30 ENCOUNTER — Encounter: Payer: Self-pay | Admitting: Cardiovascular Disease

## 2021-05-31 MED ORDER — AMLODIPINE BESYLATE 10 MG PO TABS: 10.0000 mg | ORAL_TABLET | Freq: Every day | ORAL | 3 refills | Status: DC

## 2021-05-31 NOTE — Telephone Encounter (Signed)
Sent new prescription in to pharmacy on file for Amlodipine '10mg'$  and placed referral to pharmD clinic for HTN management. Message sent to pt via [place]. ?

## 2021-05-31 NOTE — Telephone Encounter (Signed)
I would go ahead and increase amlodipine to 10 mg daily. Please emphasize that he is more likely to have some leg swelling on the higher dose, but most tolerate it well. Continue to log BP's and please arrange follow-up in the PharmD HTN Clinic in a few weeks. Bring BP readings to appointment.  ?

## 2021-06-07 ENCOUNTER — Encounter: Payer: Self-pay | Admitting: Cardiovascular Disease

## 2021-06-11 ENCOUNTER — Encounter: Payer: Self-pay | Admitting: Cardiovascular Disease

## 2021-06-21 ENCOUNTER — Encounter: Payer: Self-pay | Admitting: Internal Medicine

## 2021-06-22 ENCOUNTER — Ambulatory Visit: Payer: Medicare Other

## 2021-06-27 NOTE — Telephone Encounter (Signed)
Called pt, scheduled PharmD HTN appt at next available. ?

## 2021-07-06 IMAGING — DX DG CHEST 1V PORT
1 series · 1 of 1 positions shown · non-contrast
Comparison: Radiograph 07/19/2019, CT 06/11/2019

CLINICAL DATA: Atelectasis

EXAM:
PORTABLE CHEST 1 VIEW

[chest ap]
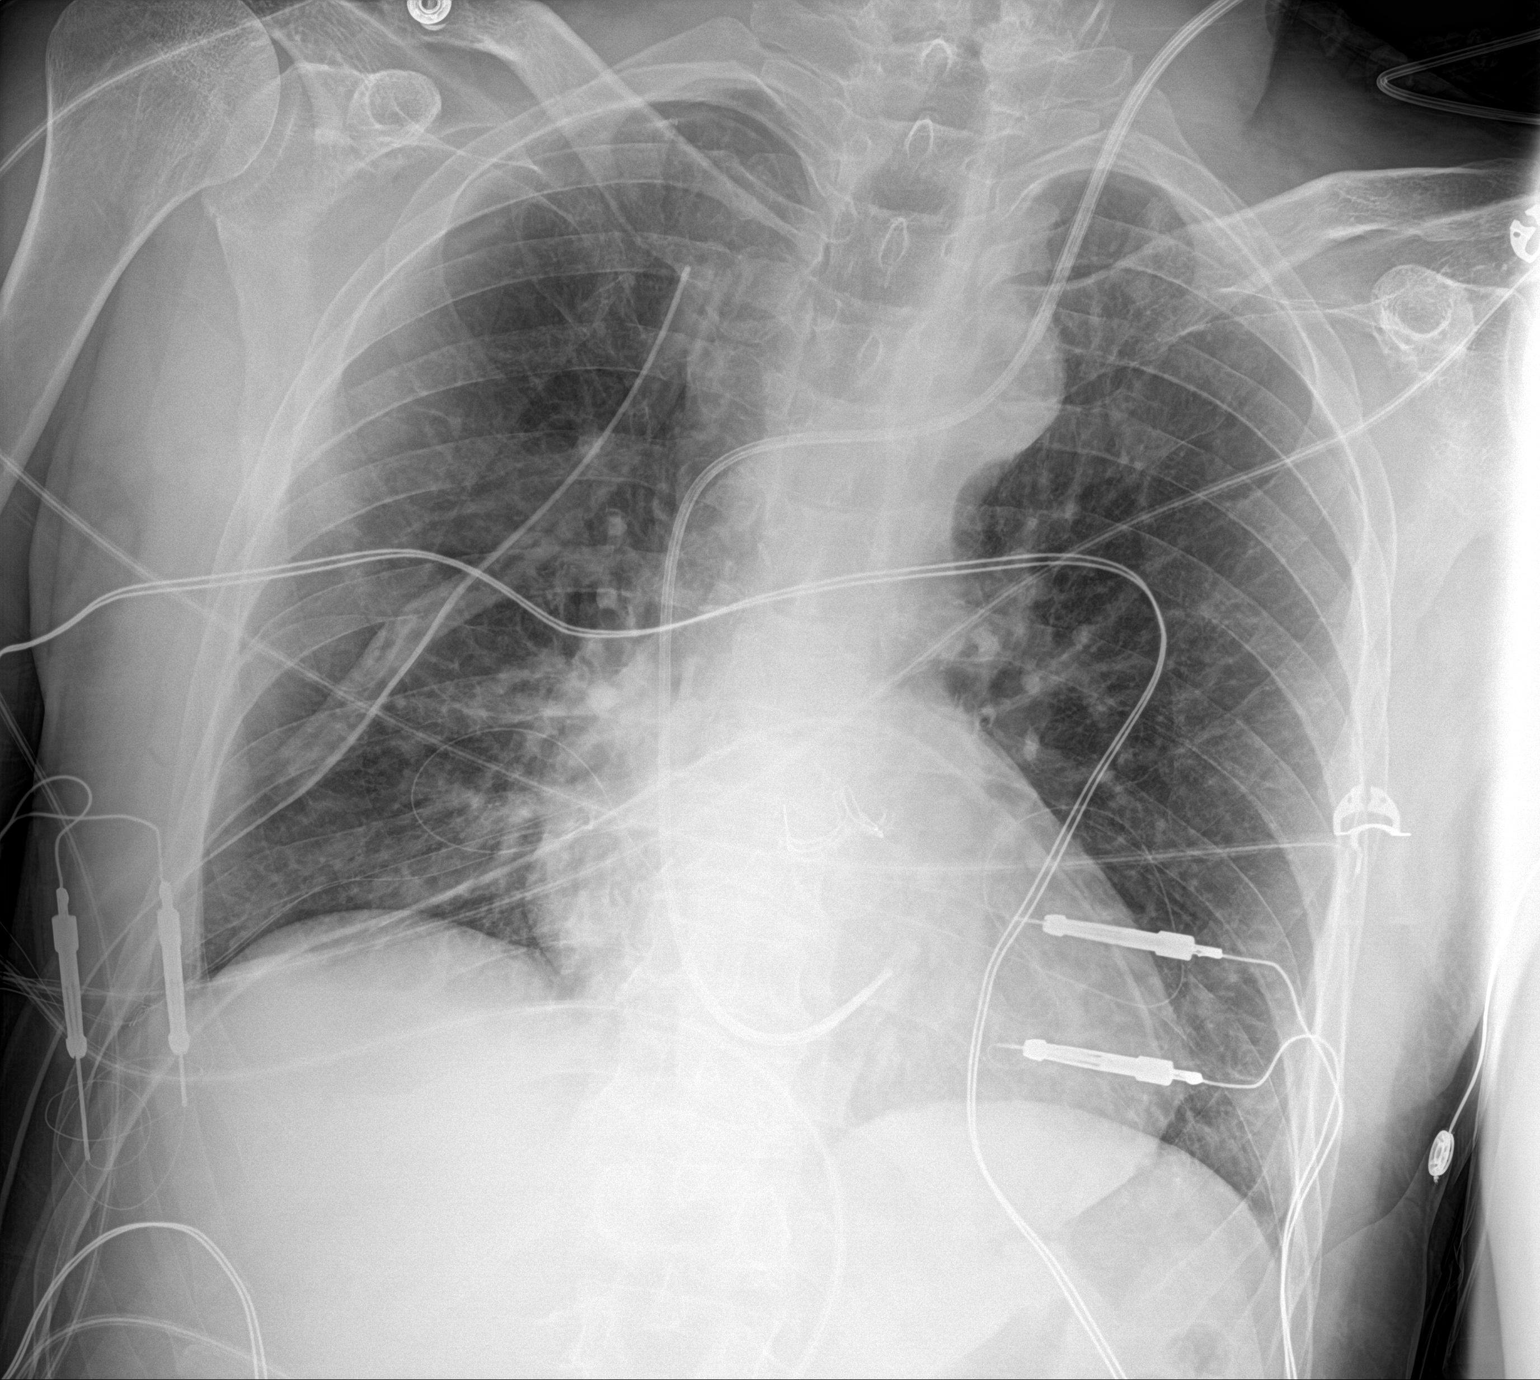

[1 of 1 positions shown; findings below may reference images not displayed]

FINDINGS: A left IJ approach Swan-Ganz catheter tip appears positioned at the
level of the right ventricle/pulmonary outflow tract.

Small epicardial pacer wires are present.

Bioprosthetic aortic valve replacement is noted.

Telemetry leads overlie the chest.

Right chest tube and mediastinal drain are in place.

Lung volumes are slightly diminished with some streaky opacities in
the lung bases favoring atelectasis. Mild central vascular
congestion with faint interstitial opacities could suggest at least
mild edema as well. There is a small right apical pneumothorax,
please see annotated image. No visible pleural fluid. No acute
osseous or soft tissue abnormality.
IMPRESSION: 1. Small right apical pneumothorax.  Right chest tube in place.
2. Streaky opacities in the lung bases favoring atelectasis.
3. Mild central vascular congestion and faint interstitial opacities
could suggest at least mild edema as well.

## 2021-07-07 IMAGING — DX DG CHEST 1V PORT
1 series · 1 of 1 positions shown · non-contrast
Comparison: Chest radiograph from one day prior.

CLINICAL DATA: Atelectasis

EXAM:
PORTABLE CHEST 1 VIEW

[chest]
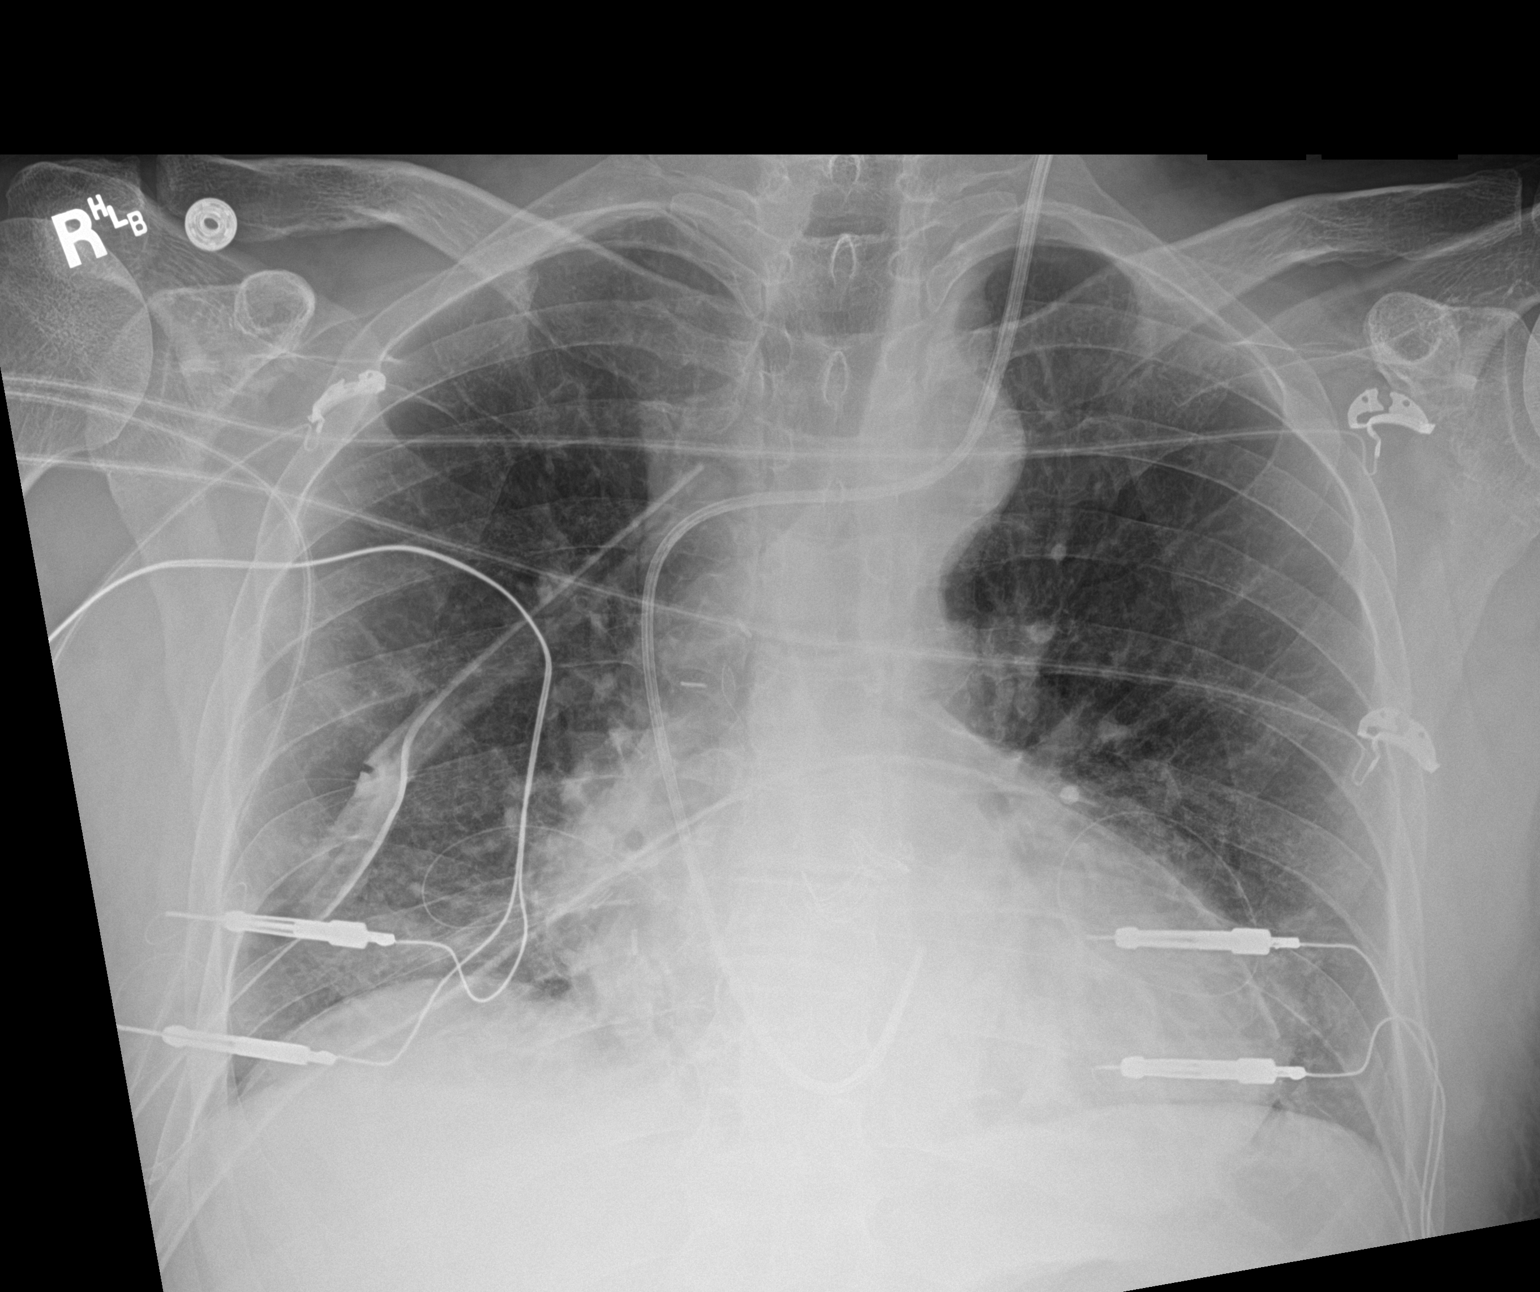

[1 of 1 positions shown; findings below may reference images not displayed]

FINDINGS: Upper right chest tube in place. Left internal jugular central
venous catheter terminates no over the right ventricle. Cardiac
valvular prosthesis in place. Stable mediastinal drain. Stable
cardiomediastinal silhouette with mild cardiomegaly. No
pneumothorax. No pleural effusion. No pulmonary edema. Similar mild
bibasilar atelectasis.
IMPRESSION: 1. No pneumothorax. Right chest tube in place.
2. Similar mild bibasilar atelectasis.
3. Stable mild cardiomegaly without pulmonary edema.

## 2021-07-11 IMAGING — CT CT CERVICAL SPINE W/O CM
3 of 4 series · 13 of 33 positions shown, 16 images · non-contrast
Comparison: None.

CLINICAL DATA: Fell, loss of consciousness, syncope

EXAM:
CT CERVICAL SPINE WITHOUT CONTRAST
TECHNIQUE: Multidetector CT imaging of the cervical spine was performed without
intravenous contrast. Multiplanar CT image reconstructions were also
generated.

[Series 10: c_spine 2.0 sag bone · sagittal · 0.27mm/px · 5 of 61 slices shown, 6 images]
[im 21/61  bone]
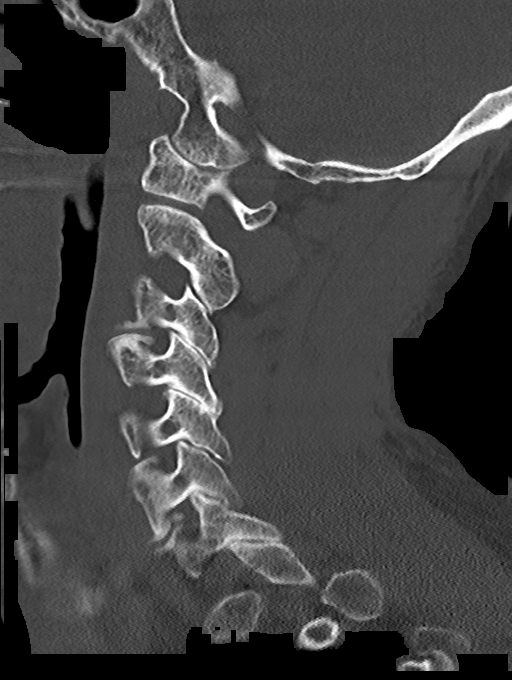
[im 26/61  bone]
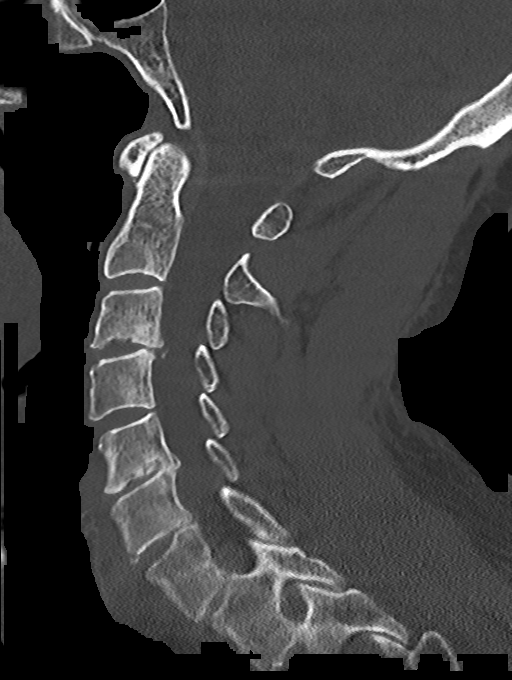
[im 31/61  soft-tissue]
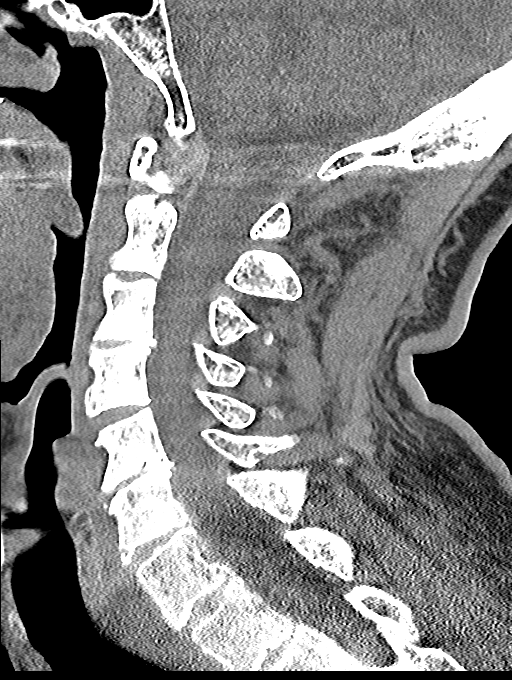
[im 31/61  bone]
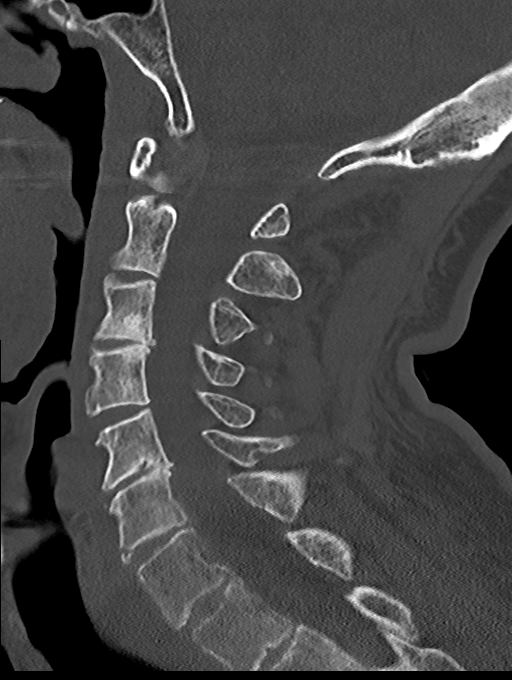
[im 36/61  bone]
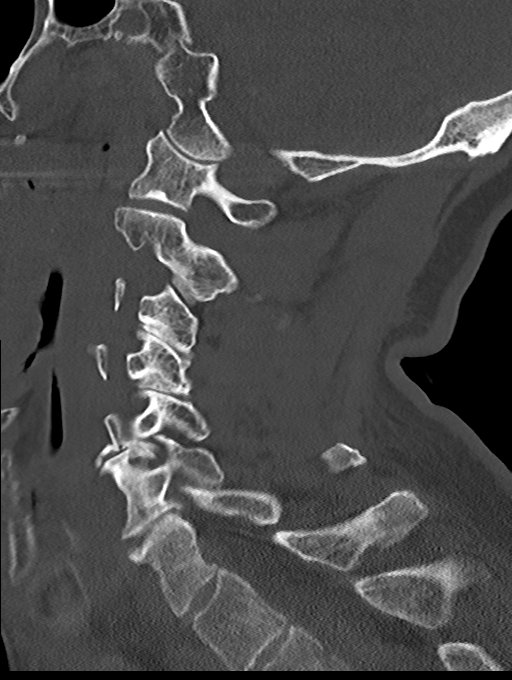
[im 41/61  bone]
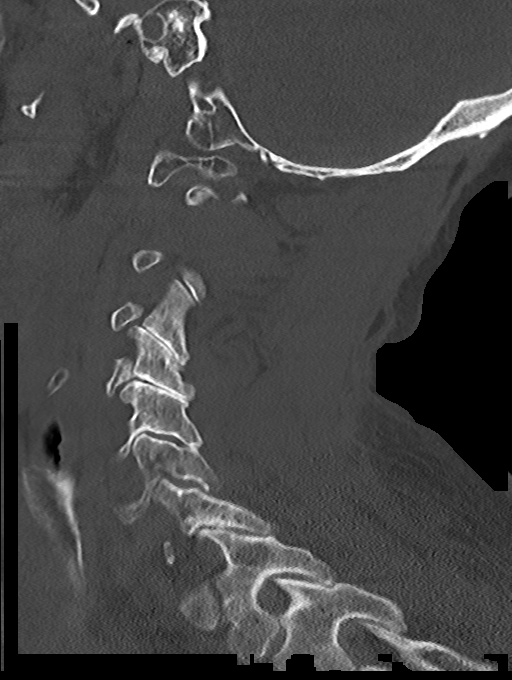

[Series 11: c_spine 2.0 cor bone · coronal · 0.27mm/px · 3 of 61 slices shown]
[im 13/61  bone]
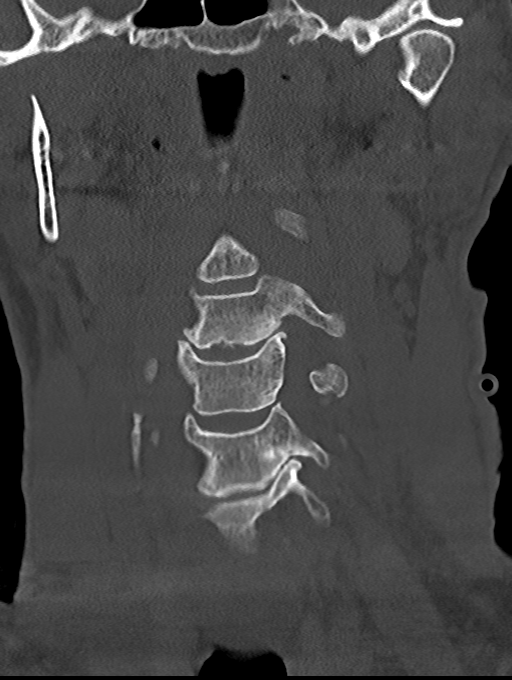
[im 25/61  bone]
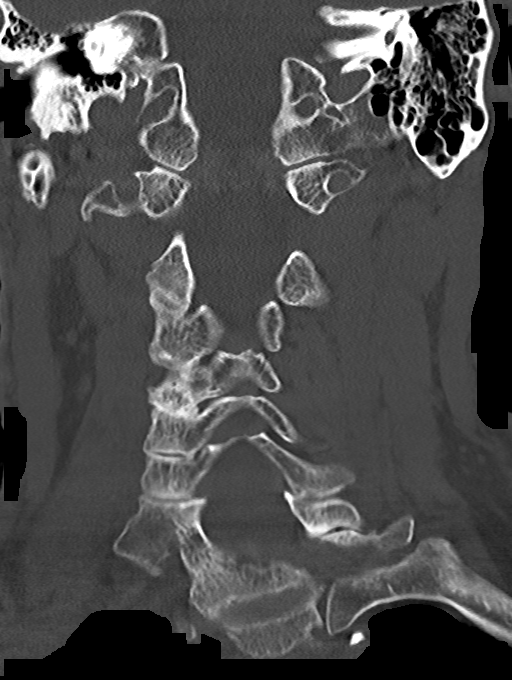
[im 37/61  bone]
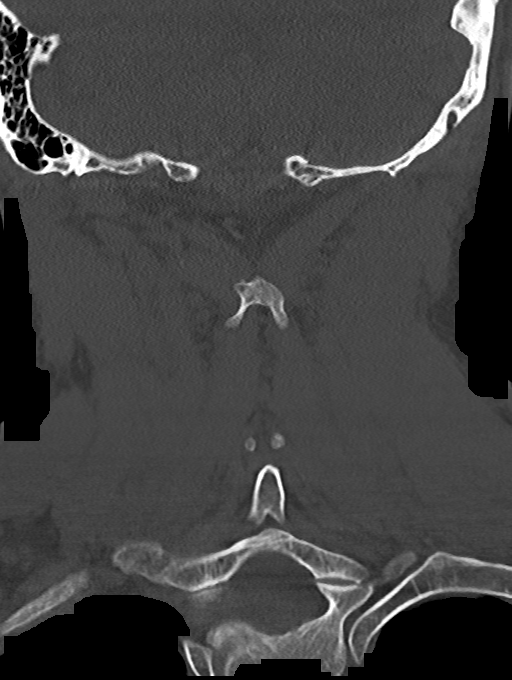

[Series 12: c_spine 2.0 orthogonals · axial · 0.21mm/px · z∈[-296,-164]mm · 5 of 87 slices shown, 7 images]
[im 15/87  soft-tissue]
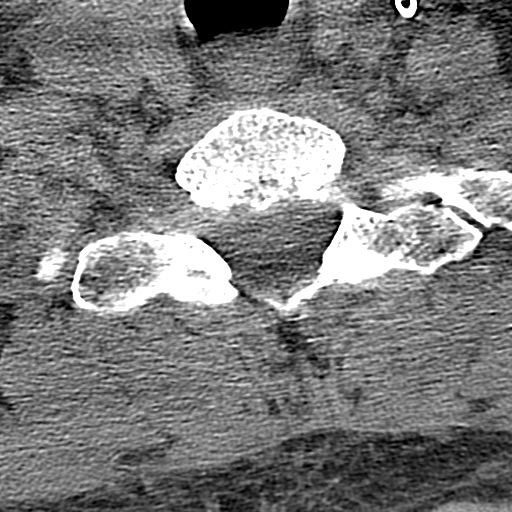
[im 15/87  bone]
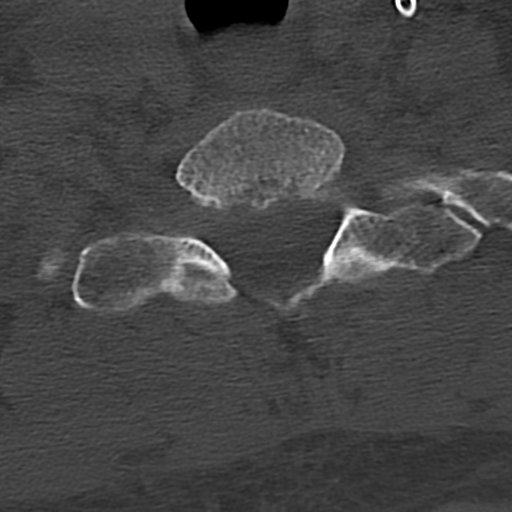
[im 29/87  bone]
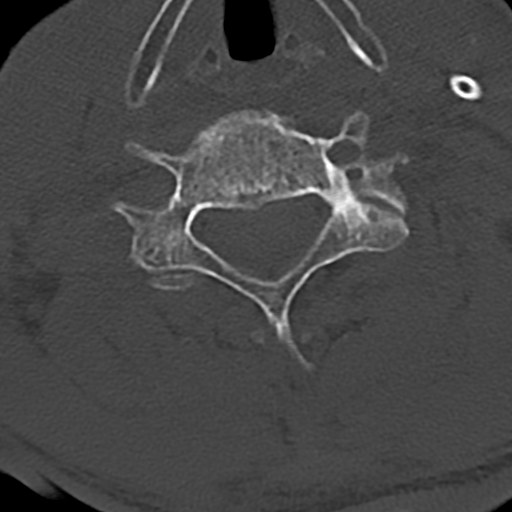
[im 44/87  bone]
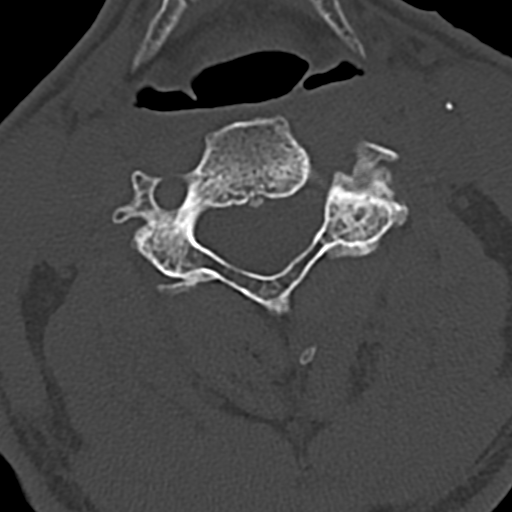
[im 58/87  bone]
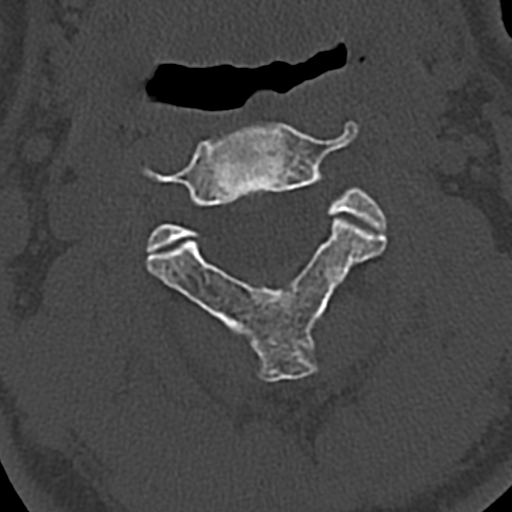
[im 72/87  soft-tissue]
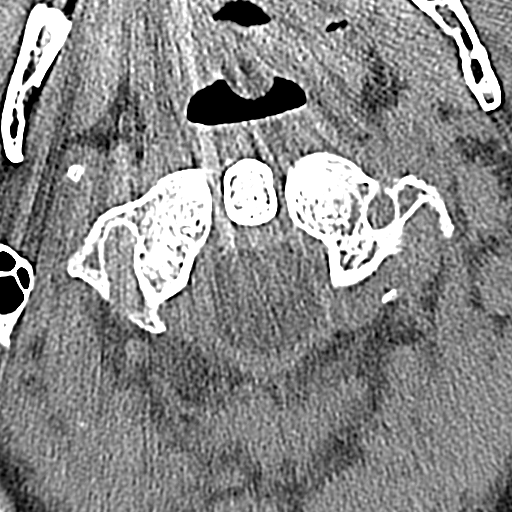
[im 72/87  bone]
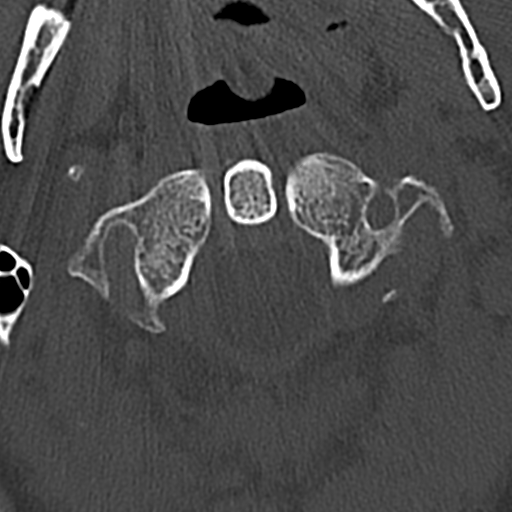

[13 of 33 positions shown; findings below may reference images not displayed]

FINDINGS: Alignment: Alignment is grossly anatomic.

Skull base and vertebrae: There is a small displaced fracture
through the anterior inferior margin of the C6 vertebral body, best
seen on sagittal reconstructed images consistent with hyperextension
injury. Minimal distraction at the fracture site.

No other acute displaced fractures.

Soft tissues and spinal canal: No prevertebral fluid or swelling. No
visible canal hematoma.

Disc levels: There is mild diffuse spondylosis and facet
hypertrophy. Changes are most pronounced at C3/C4, C5/C6, and C6/C7.
There is bilateral neural foraminal encroachment at these levels as
result of circumferential disc osteophyte complex. Changes are
slightly eccentric to the left.

Upper chest: Trace right apical pneumothorax is seen, reported on
prior chest x-ray.

Other: Reconstructed images demonstrate no additional findings.
IMPRESSION: 1. Avulsion fracture anterior inferior aspect of the C6 vertebral
body as above, consistent with hyperextension injury. Minimal
distraction of the fracture fragment.
2. Multilevel cervical spondylosis and facet hypertrophy as above,
greatest at C3-4, C5-6, and C6-7.

These results were called by telephone at the time of interpretation
on 07/28/2019 at [DATE] to provider ABAGAIL SICARD , who verbally
acknowledged these results.

## 2021-07-12 IMAGING — CR DG CHEST 2V
2 series · 2 of 2 positions shown · non-contrast
Comparison: July 27, 2019.

CLINICAL DATA: Postoperative status.

EXAM:
CHEST - 2 VIEW

[chest lat]
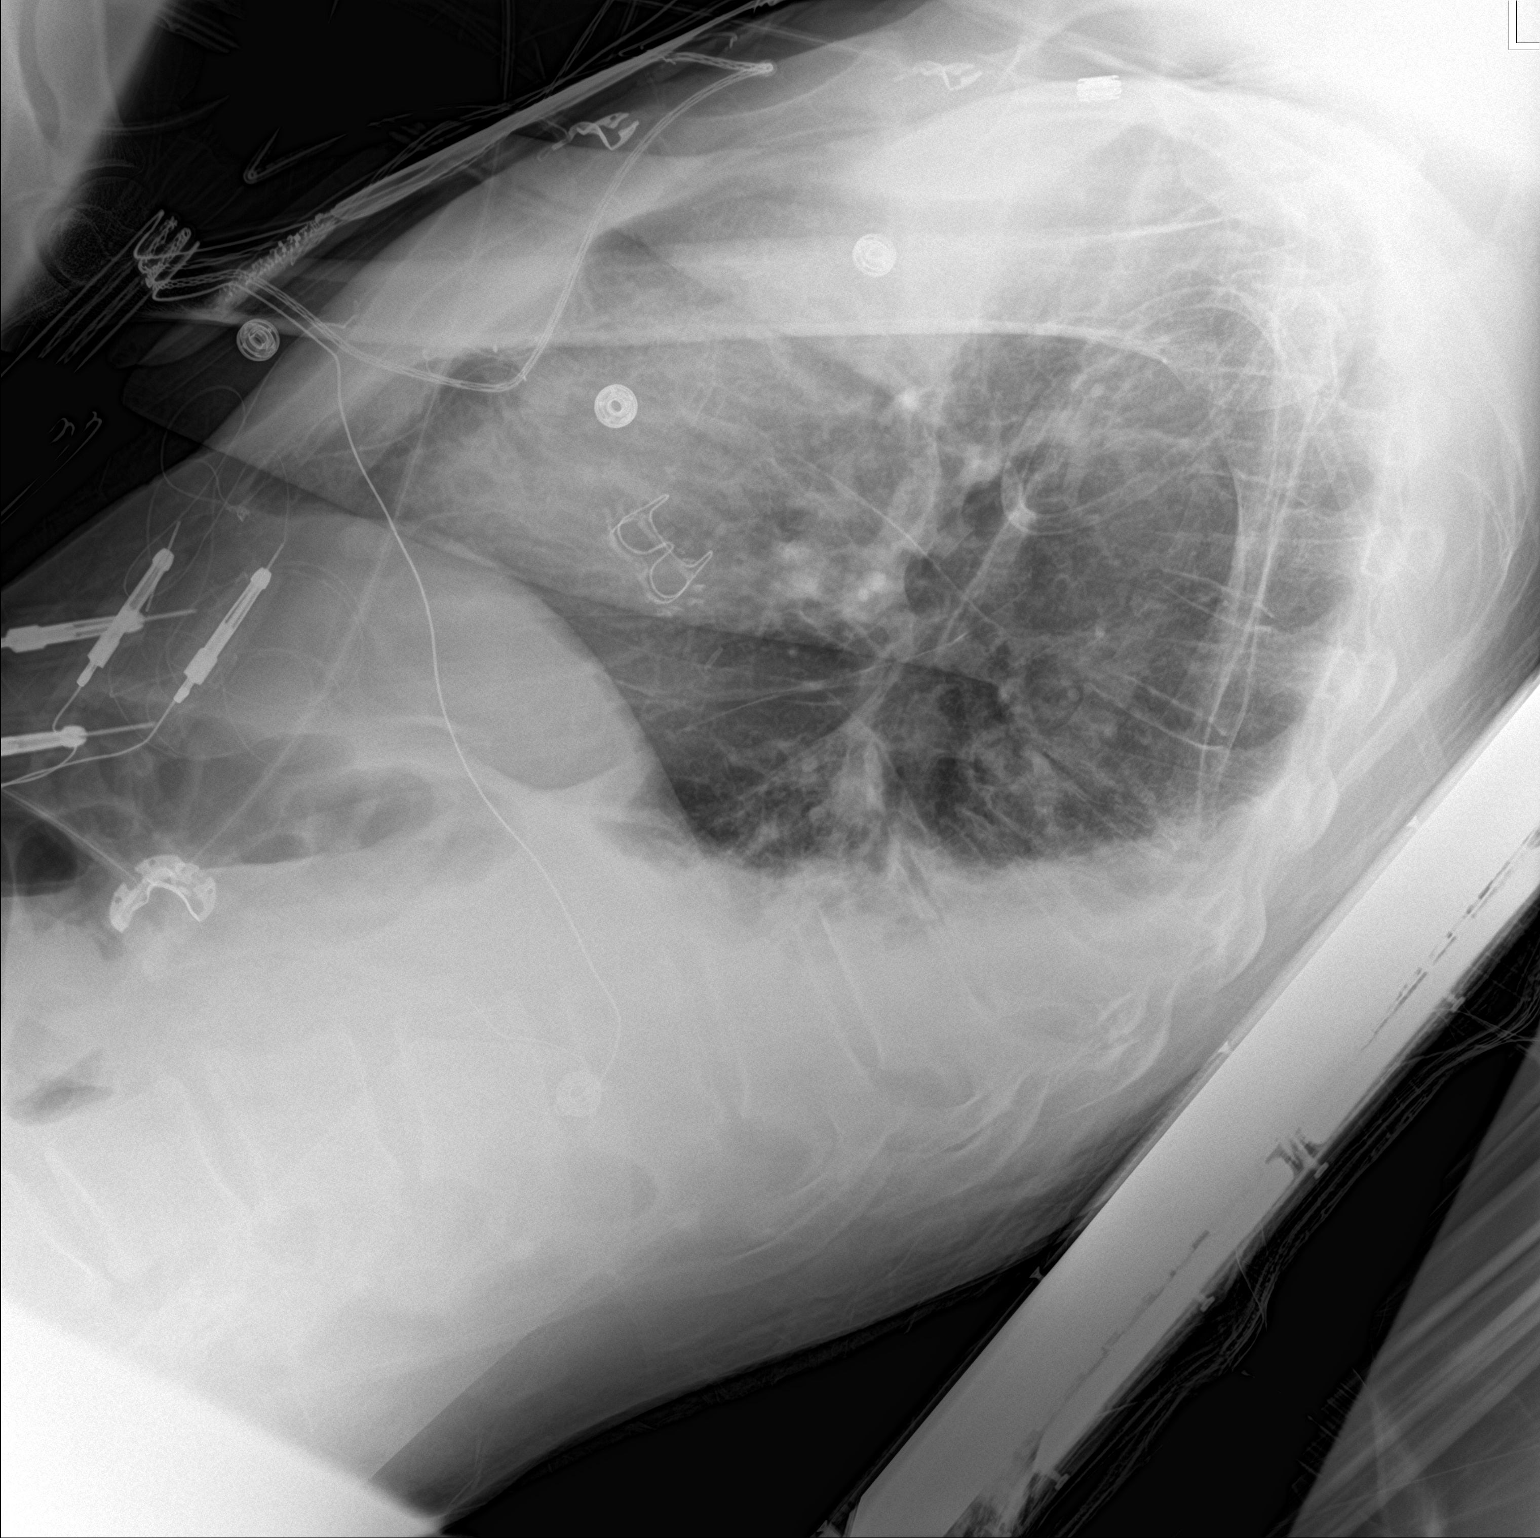

[chest ap]
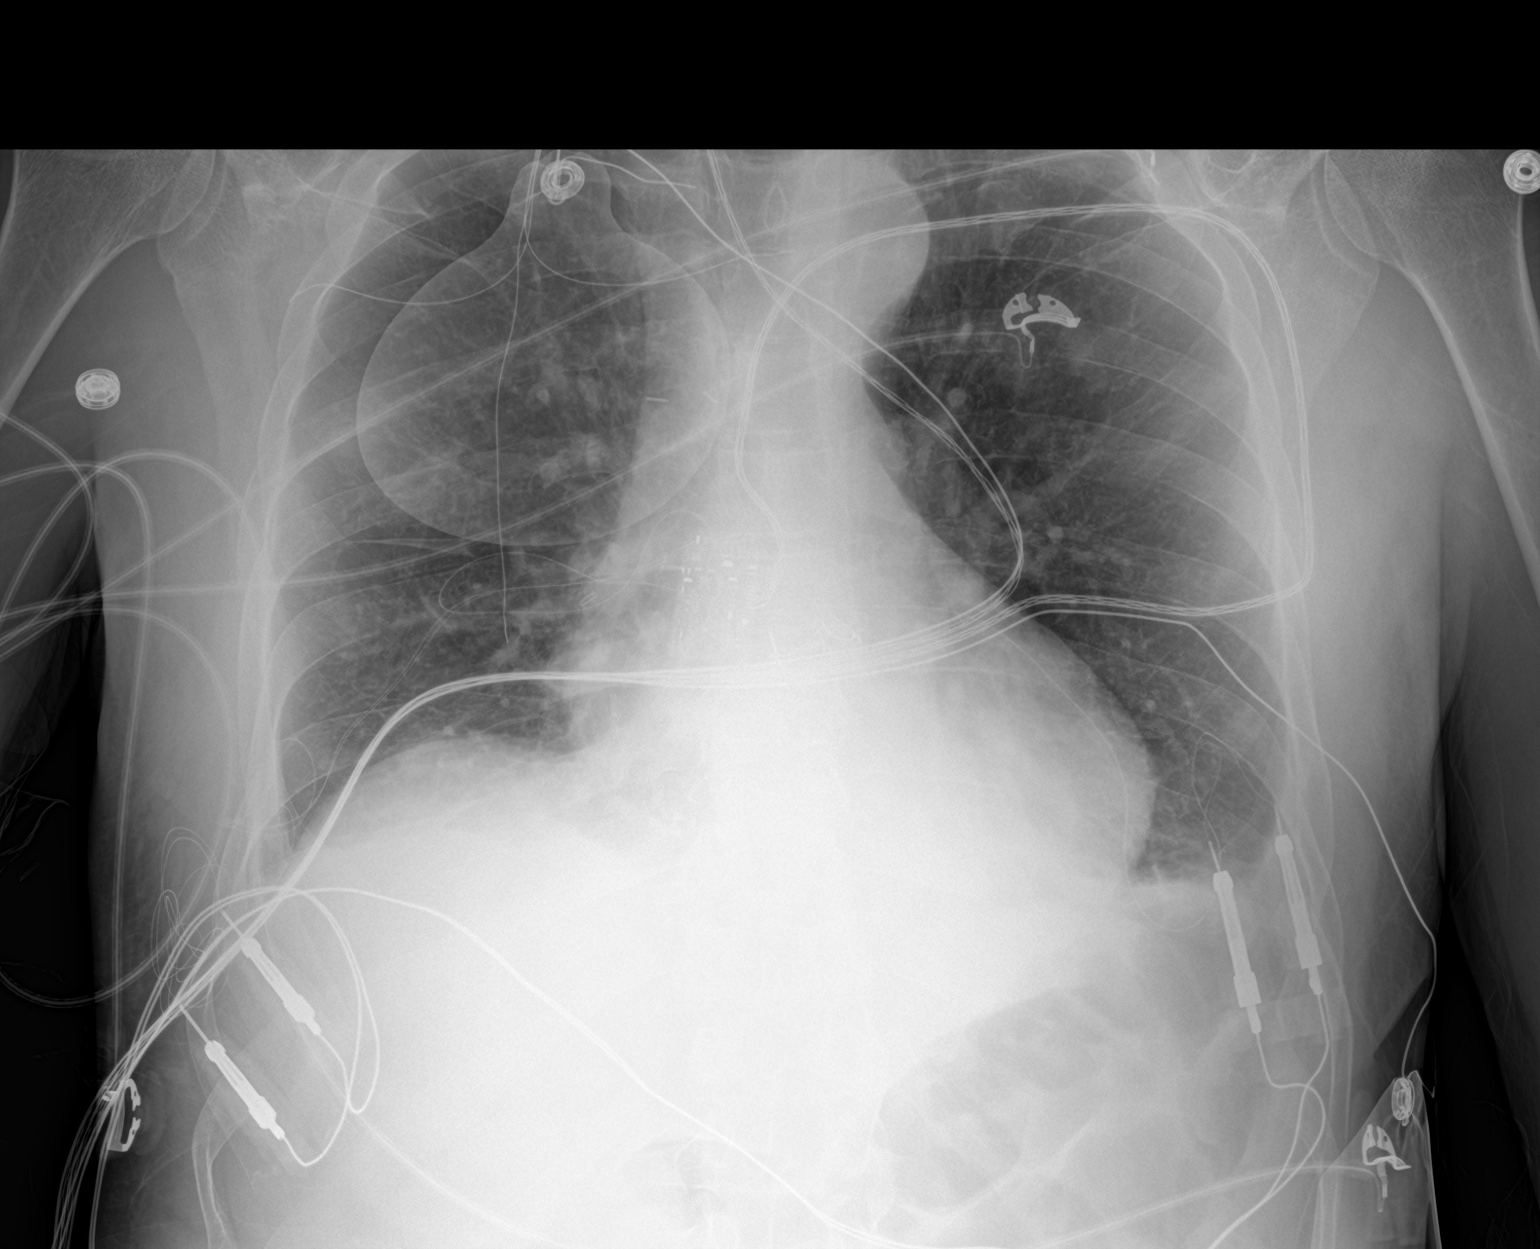

[2 of 2 positions shown; findings below may reference images not displayed]

FINDINGS: Stable cardiomediastinal silhouette. No pneumothorax is noted. Mild
bibasilar subsegmental atelectasis is noted with small pleural
effusions. Bony thorax is unremarkable.
IMPRESSION: Mild bibasilar subsegmental atelectasis with small pleural
effusions.

## 2021-07-24 ENCOUNTER — Ambulatory Visit: Payer: Medicare Other | Admitting: Pharmacist

## 2021-07-24 DIAGNOSIS — I10 Essential (primary) hypertension: Secondary | ICD-10-CM | POA: Diagnosis not present

## 2021-07-24 MED ORDER — CHLORTHALIDONE 25 MG PO TABS: ORAL_TABLET | ORAL | 5 refills | Status: DC

## 2021-07-24 NOTE — Progress Notes (Signed)
Patient ID: Jerry Foster                 DOB: 01-Jan-1951                      MRN: [id number] ? ? ? ? ?HPI: ?Jerry Foster is a 71 y.o. male referred by Dr. Burt Knack to HTN clinic. PMH is significant for severe aortic stenosis s/p bioprosthetic AVR in 2021, complete heart block requiring permanent pacemaker, afib/flutter s/p afib ablation 02/2020, and HTN. Pt sent in elevated home BP readings in February and was started on amlodipine '5mg'$  daily which was subsequently increased to '10mg'$  daily. His home cuff was brought to PCP office - and measured 52mHg higher. After purchasing a new BP cuff, updated readings were sent in on 06/07/21 of 159/100, 143/93, 140/88, and 155/93. He was referred to HTN clinic. ? ?Pt presents today for follow up. Reports tolerating amlodipine well. Does have some dizziness, seems to be unrelated to BP. Has had occasional dizzy spells while he's driving in his car. Doesn't use NSAIDs. Had some swelling in his ankles 7-10 days ago, this was new for him. Drinks 2 cups of coffee in the AM and has had these already today. Takes BP meds in the AM as well. Has had memory concerns, tried stopping his pravastatin a month ago but hasn't noticed improvement. Brings in home Omron bicep cuff today, readings 140/95, 141/86 on recheck, comparable to clinic reading. ? ?Current HTN meds: amlodipine '10mg'$  daily (8am) ?BP goal: <130/816mg ? ?Family History: Brother with HTN. ? ?Social History: Retired dePharmacist, communityNo drug or tobacco use. 2 glasses of wine 3-4x/week. ? ?Diet: Eats out regularly. Doesn't add much salt to food. 2 cups of coffee in the AM. ? ?Exercise: Lots of yardwork ? ?Home BP readings: 150/97, 137/92, 137/92, 132/92, 131/84, 144/91, 131/84, 134/87, 132/94, 139/92. HR 66-79 ? ?Wt Readings from Last 3 Encounters:  ?05/08/21 161 lb 3.2 oz (73.1 kg)  ?03/20/21 170 lb 8 oz (77.3 kg)  ?02/27/21 165 lb (74.8 kg)  ? ?BP Readings from Last 3 Encounters:  ?05/08/21 (!) 150/90  ?03/20/21 122/80  ?02/27/21 130/88   ? ?Pulse Readings from Last 3 Encounters:  ?05/08/21 76  ?03/20/21 70  ?02/27/21 74  ? ? ?Renal function: ?CrCl cannot be calculated (Patient's most recent lab result is older than the maximum 21 days allowed.). ? ?Past Medical History:  ?Diagnosis Date  ? Aortic stenosis   ? Benign prostatic hyperplasia   ? Bronchitis   ? Cataract   ? bilateral   ? [place]) [place]  ? Colon polyps   ? Complete heart block ([facility]  ? Heart murmur   ? History of fracture of vertebral column   ? Hyperlipidemia   ? Kidney stone   ? Nonrheumatic aortic valve stenosis   ? Osteoporosis   ? Osteoporosis   ? Pure hypercholesterolemia   ? RBBB   ? 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  ? 21 mm Edwards Inspiris Resilia stented bovine pericardial tissue valve via right mini thoracotomy approach  ? Sensorineural hearing loss of both ears   ? Thrombosed hemorrhoids   ? hsitory of  ? Tubular adenoma of colon   ? Vitamin D deficiency   ? ? ?Current Outpatient Medications on File Prior to Visit  ?Medication Sig Dispense Refill  ? acetaminophen (TYLENOL) 500 MG tablet Take 1,000 mg by mouth every 8 (eight) hours as needed  for moderate pain or headache.    ? amLODipine (NORVASC) 10 MG tablet Take 1 tablet (10 mg total) by mouth daily. 90 tablet 3  ? amoxicillin (AMOXIL) 500 MG capsule as directed. For dental appts    ? apixaban (ELIQUIS) 5 MG TABS tablet TAKE 1 TABLET(5 MG) BY MOUTH TWICE DAILY 60 tablet 6  ? Calcium Citrate-Vitamin D (CITRACAL + D PO) Take 2 tablets by mouth in the morning and at bedtime.    ? Cholecalciferol (DIALYVITE VITAMIN D 5000) 125 MCG (5000 UT) capsule Take 5,000 Units by mouth daily.    ? ibuprofen (ADVIL) 200 MG tablet Take 400 mg by mouth every 6 (six) hours as needed for moderate pain.     ? Omega-3 1000 MG CAPS Take by mouth daily at 6 (six) AM.    ? pravastatin (PRAVACHOL) 40 MG tablet Take 40 mg by mouth at bedtime.    ? ?No current  facility-administered medications on file prior to visit.  ? ? ?Allergies  ?Allergen Reactions  ? Lamisil [Terbinafine]   ?  Elevated liver enzymes (LFTs)  ? ? ? ?Assessment/Plan: ? ?1. Hypertension - BP remains elevated above goal < 130/26mHg. Will start chlorthalidone 12.'5mg'$  daily and continue amlodipine '10mg'$  daily. Will follow up in 3 weeks for BP check and BMET. Pt encouraged to continue keeping log of home BP readings and bring them in to next visit. Aim for < 2,'000mg'$  of daily sodium and 150 minutes of activity each week. ? ?Johniya Durfee E. Asher Torpey, PharmD, BCACP, CPP ?[id number] [place] [id number]?Phone: [phone] Fax: [phone]?07/24/2021 9:32 AM ? ? ?

## 2021-07-24 NOTE — Patient Instructions (Signed)
Your blood pressure goal is < 130/13mHg ? ?Start taking chlorthalidone - 1/2 tablet once daily in the morning ? ?Continue taking your other medications ? ?Monitor your blood pressure at home and bring in your readings to your next visit at the end of the month ? ?Aim for < 2,'000mg'$  of sodium each day ? ?

## 2021-08-02 LAB — CUP PACEART REMOTE DEVICE CHECK
Battery Remaining Longevity: 110 mo
Battery Remaining Percentage: 85 %
Battery Voltage: 3.02 V
Brady Statistic AP VP Percent: 1 %
Brady Statistic AP VS Percent: 14 %
Brady Statistic AS VP Percent: 1 %
Brady Statistic AS VS Percent: 86 %
Brady Statistic RA Percent Paced: 13 %
Brady Statistic RV Percent Paced: 1 %
Date Time Interrogation Session: 20230507233028
Implantable Lead Implant Date: 20210506
Implantable Lead Implant Date: 20210506
Implantable Lead Location: 753859
Implantable Lead Location: 753860
Implantable Pulse Generator Implant Date: 20210506
Lead Channel Impedance Value: 430 Ohm
Lead Channel Impedance Value: 440 Ohm
Lead Channel Pacing Threshold Amplitude: 0.625 V
Lead Channel Pacing Threshold Amplitude: 1 V
Lead Channel Pacing Threshold Pulse Width: 0.4 ms
Lead Channel Pacing Threshold Pulse Width: 0.4 ms
Lead Channel Sensing Intrinsic Amplitude: 3.6 mV
Lead Channel Sensing Intrinsic Amplitude: 5.2 mV
Lead Channel Setting Pacing Amplitude: 1.25 V
Lead Channel Setting Pacing Amplitude: 1.625
Lead Channel Setting Pacing Pulse Width: 0.4 ms
Lead Channel Setting Sensing Sensitivity: 0.7 mV
Pulse Gen Model: 2272
Pulse Gen Serial Number: 3825759

## 2021-08-03 ENCOUNTER — Ambulatory Visit (INDEPENDENT_AMBULATORY_CARE_PROVIDER_SITE_OTHER): Payer: Medicare Other

## 2021-08-03 DIAGNOSIS — I442 Atrioventricular block, complete: Secondary | ICD-10-CM | POA: Diagnosis not present

## 2021-08-09 NOTE — Progress Notes (Signed)
Remote pacemaker transmission.   

## 2021-08-14 NOTE — Progress Notes (Unsigned)
Patient ID: Jerry Foster                 DOB: 1951-01-02                      MRN: [id number]     HPI: Jerry Foster is a 71 y.o. male referred by Dr. Burt Knack to HTN clinic. PMH is significant for severe aortic stenosis s/p bioprosthetic AVR in 2021, complete heart block requiring permanent pacemaker, afib/flutter s/p afib ablation 02/2020, and HTN. Pt sent in elevated home BP readings in February and was started on amlodipine '5mg'$  daily which was subsequently increased to '10mg'$  daily. His home cuff was brought to PCP office - and measured 65mHg higher. After purchasing a new BP cuff, updated readings were sent in on 06/07/21 of 159/100, 143/93, 140/88, and 155/93. He was referred to HTN clinic. At PharmD appt on 07/24/21 pt was tolerating amlodipine, but BP still elevated to 140/90. Added chlorthalidone 12.5 mg daily. Validated home BP cuff in clinic.  Pt presents today for HTN f/u. Pt started chlorthalidone on 07/24/21. Has not noticed any AE, but still endorsing some swelling in his feet and ankles (new since starting amlodipine, also reported at last visit). Again endorses worsening memory. Asked whether any of his medications may contribute to memory impairment. Stopped pravastatin ~2 months ago w/ no changes in memory, but still has not restarted. Takes BP meds in the AM, uses pill box. No changes in diet and exercise since last visit. Occasional dizzy spells consistent with baseline. Denies HA, blurred vision, increased dizziness. Requests refill on Eliquis today.   Current HTN meds: amlodipine '10mg'$  daily (8am), chlorthalidone 12.5 mg daily (8AM) BP goal: <130/848mg  Family History: Brother with HTN.  Social History: Retired dePharmacist, communityNo drug or tobacco use. 2 glasses of wine 3-4x/week.  Diet: Eats out regularly. Doesn't add much salt to food. 2 cups of coffee in the AM.  Exercise: Lots of yardwork  Home BP readings:  5/7: 143/84  5/8: 147/80 (before meds) 5/9: 116/73 5/10: 135/72 5/12:  121/82 5/13: 130/88 5/14: 130/83 5/15: 131/81 5/18: 129/82 (before meds) 5/20: 127/79 5/21: 117/78  Pulse range 60s-80s  Wt Readings from Last 3 Encounters:  05/08/21 161 lb 3.2 oz (73.1 kg)  03/20/21 170 lb 8 oz (77.3 kg)  02/27/21 165 lb (74.8 kg)   BP Readings from Last 3 Encounters:  07/24/21 140/90  05/08/21 (!) 150/90  03/20/21 122/80   Pulse Readings from Last 3 Encounters:  07/24/21 73  05/08/21 76  03/20/21 70    Renal function: CrCl cannot be calculated (Patient's most recent lab result is older than the maximum 21 days allowed.).  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   21 mm Edwards Inspiris Resilia stented bovine pericardial tissue valve via right mini thoracotomy approach   Sensorineural hearing loss of both ears    Thrombosed hemorrhoids  hsitory of   Tubular adenoma of colon    Vitamin D deficiency     Current Outpatient Medications on File Prior to Visit  Medication Sig Dispense Refill   acetaminophen (TYLENOL) 500 MG tablet Take 1,000 mg by mouth every 8 (eight) hours as needed for moderate pain or headache.     amLODipine (NORVASC) 10 MG tablet Take 1 tablet (10 mg total) by mouth daily. 90 tablet 3   amoxicillin (AMOXIL) 500 MG capsule as directed. For dental appts     apixaban (ELIQUIS) 5 MG TABS tablet TAKE 1 TABLET(5 MG) BY MOUTH TWICE DAILY 60 tablet 6   Calcium Citrate-Vitamin D (CITRACAL + D PO) Take 2 tablets by mouth in the morning and at bedtime.      chlorthalidone (HYGROTON) 25 MG tablet Take 1/2 tablet by mouth daily. 15 tablet 5   Cholecalciferol (DIALYVITE VITAMIN D 5000) 125 MCG (5000 UT) capsule Take 5,000 Units by mouth daily.     ibuprofen (ADVIL) 200 MG tablet Take 400 mg by mouth every 6 (six) hours as needed for moderate pain.  (Patient not taking: Reported on 07/24/2021)     Omega-3 1000 MG CAPS Take by mouth daily at 6 (six) AM. (Patient not taking: Reported on 07/24/2021)     pravastatin (PRAVACHOL) 40 MG tablet Take 40 mg by mouth at bedtime. (Patient not taking: Reported on 07/24/2021)     No current facility-administered medications on file prior to visit.    Allergies  Allergen Reactions   Lamisil [Terbinafine]     Elevated liver enzymes (LFTs)     Assessment/Plan:  1. Hypertension - Clinic BP of 130/78 controlled at goal <130/80 mmHg on amlodipine 10 mg daily and chlorthalidone 12.5 mg daily, largely consistent with home readings. Will check BMET today to monitor for electrolyte abnormalities on chlorthalidone. Will communicate results to pt via MyChart per pt preference. If BMET WNL, consider increasing chlorthalidone to 25 mg daily and decreasing amlodipine to 5 mg daily to reduce suspected AE of LEE with amlodipine. Refilled Eliquis and counseled pt to use acetaminophen rather than ibuprofen for occasional pain/fever relief due to increased risk of bleeding with NSAIDs. Pt agreeable. Encouraged pt to continue keeping log of home BP readings and bring them in to next visit. Will schedule follow-up after reviewing BMET.   Patient seen with Park Liter, PY4 PharmD Candidate  Ramond Dial, Sandy.D, BCPS, CPP [place]  [address], [place], [place] [address]  Phone: [phone]; Fax: [phone]

## 2021-08-15 ENCOUNTER — Ambulatory Visit: Payer: Medicare Other | Admitting: Pharmacist

## 2021-08-15 VITALS — BP 130/78 | HR 62

## 2021-08-15 DIAGNOSIS — I10 Essential (primary) hypertension: Secondary | ICD-10-CM | POA: Diagnosis not present

## 2021-08-15 MED ORDER — APIXABAN 5 MG PO TABS: ORAL_TABLET | ORAL | 5 refills | Status: DC

## 2021-08-15 NOTE — Patient Instructions (Addendum)
It was great to see you today!  Your blood pressures are much improved. Your goal is <130/80 and your blood pressure today was 130/78.  Continue taking amlodipine 10 mg daily and chlorthalidone 12.5 mg daily We will send you a MyChart message with further instructions after we review your lab work tomorrow morning. We will also schedule a follow-up appt after we see your labs.  Continue checking your resting blood pressure at home 3-4 times/week. Bring in a log to your next visit.   We sent 6 more months of Eliquis to your pharmacy.

## 2021-08-16 LAB — BASIC METABOLIC PANEL
BUN/Creatinine Ratio: 27 — ABNORMAL HIGH (ref 10–24)
BUN: 24 mg/dL (ref 8–27)
CO2: 26 mmol/L (ref 20–29)
Calcium: 9.8 mg/dL (ref 8.6–10.2)
Chloride: 98 mmol/L (ref 96–106)
Creatinine, Ser: 0.89 mg/dL (ref 0.76–1.27)
Glucose: 92 mg/dL (ref 70–99)
Potassium: 3.8 mmol/L (ref 3.5–5.2)
Sodium: 139 mmol/L (ref 134–144)
eGFR: 91 mL/min/{1.73_m2} (ref >59)

## 2021-08-22 NOTE — Progress Notes (Unsigned)
Contacted Jerry Foster via telephone to confirm appt on 6/27 at 1:30PM as pt had not read MyChart message. Will keep appt as scheduled.

## 2021-09-02 ENCOUNTER — Other Ambulatory Visit (HOSPITAL_COMMUNITY): Payer: Self-pay | Admitting: Physician Assistant

## 2021-09-03 NOTE — Telephone Encounter (Signed)
Prescription refill request for Eliquis received. Indication: Atrial Fib Last office visit: 05/08/21  C Fenton PA-C Scr: 0.89 on 08/15/21 Age:  71 Weight: 73.1kg  Based on above findings Eliquis '5mg'$  twice daily is the appropriate dose.  Refill approved.

## 2021-09-18 ENCOUNTER — Encounter: Payer: Self-pay | Admitting: Neurology

## 2021-09-18 ENCOUNTER — Ambulatory Visit: Payer: Medicare Other | Admitting: Neurology

## 2021-09-18 ENCOUNTER — Ambulatory Visit: Payer: Medicare Other | Admitting: Pharmacist

## 2021-09-18 VITALS — BP 114/80 | HR 66

## 2021-09-18 VITALS — BP 136/93 | HR 90 | Ht 69.5 in | Wt 161.0 lb

## 2021-09-18 DIAGNOSIS — R200 Anesthesia of skin: Secondary | ICD-10-CM

## 2021-09-18 DIAGNOSIS — R413 Other amnesia: Secondary | ICD-10-CM | POA: Diagnosis not present

## 2021-09-18 DIAGNOSIS — I442 Atrioventricular block, complete: Secondary | ICD-10-CM

## 2021-09-18 DIAGNOSIS — I10 Essential (primary) hypertension: Secondary | ICD-10-CM

## 2021-09-18 DIAGNOSIS — G459 Transient cerebral ischemic attack, unspecified: Secondary | ICD-10-CM | POA: Diagnosis not present

## 2021-09-18 DIAGNOSIS — Z950 Presence of cardiac pacemaker: Secondary | ICD-10-CM

## 2021-09-18 MED ORDER — SERTRALINE HCL 50 MG PO TABS: 50.0000 mg | ORAL_TABLET | Freq: Every day | ORAL | 11 refills | Status: DC

## 2021-09-18 NOTE — Progress Notes (Signed)
[facility]  PATIENT: Jerry Foster DOB: 1950/10/23  REFERRING DOCTOR OR PCP: Jefm Petty, MD SOURCE: Patient, notes from primary care, laboratory reports.  _________________________________   HISTORICAL  CHIEF COMPLAINT:  Chief Complaint  Patient presents with   Follow-up    RM 2, with niece and twin brother. C/o progressive memory loss. Wondering if he is a good candidate for memantine.    HISTORY OF PRESENT ILLNESS:  Jerry Foster, at [facility] for neurologic consultation regarding his memory loss and episodes of dizziness.  UPDATE 09/18/2021: He feels he is more confused and has noted more difficulties with his memory.  His family have also noted more issues.  Besides reduced STM, he has had difficulty remembering small events from the last few days.   He needs to write things down more.  He has more trouble getting names out sometimes.   He always does better with hints.    He scored 24/30 on the [facility] cognitive assessment (mild cognitive impairment).  He lost all 5 points for delayed recall.  However, about 15 to 20 minutes later when I asked him about the 5 words, he did get two correct without a prompt and an additional 1 with a category prompt..  Of note, he is educated (retired Pharmacist, community).      He denies depression but he has noted that he cries at times.        At the last visit, because he had snoring and mild excessive daytime sleepiness we ordered a home sleep study.  It did not show significant sleep apnea.  Since 09/14/2021, he noted the onset of numbness in the tongue.  He also had pain in both calves at the time.   He then laid down and talked jibberish for about 15 seconds.   The leg symptoms resolved that day but he continues to note numbness at the tip of the tongue.  The numbness is bilateral and not involving the rest of the face.  Taste has not changed.          He has had mild balance issues and feels wobbly.   He  thought he would fall a few times.   Other times, balance is fine.     These spells last 90 seconds and he tries to sit or lay down until it passes.    He had 3 times over the course of a week.   Sometimes he feels mildly wobbly.   He has a cane but is better so does not need it now.      He had a AVR in 07/2019 followed by cardiac arrest and pacemaker placement.  He developed AFib and had ablation January 2022 with good results.      His cardiologist is Dr. Burt Knack.      09/18/2021    9:28 AM 03/20/2021    8:35 AM  Montreal Cognitive Assessment   Visuospatial/ Executive (0/5) 5 4  Naming (0/3) 3 3  Attention: Read list of digits (0/2) 2 2  Attention: Read list of letters (0/1) 1 1  Attention: Serial 7 subtraction starting at 100 (0/3) 3 3  Language: Repeat phrase (0/2) 2 2  Language : Fluency (0/1) 0 1  Abstraction (0/2) 2 2  Delayed Recall (0/5) 0 3  Orientation (0/6) 6 6  Total 24 27  Adjusted Score (based on education) 24    He sleeps well, going to bed at 11 pm and waking up 430 to 7 am.  He does not wake up during the night.    He snores but is not sure if any OSA signs.     His mother and several aunts and maternal GM had Alzheimer.   His mother started having issues at Foster 87 while others were in 76's and 50's Is father lived to late 37's with no cognitive issues.     Data: I personally reviewed the CT scan from 07/28/2019.  It shows mild generalized cortical atrophy that is typical for Foster.  There do not appear to be any large vessel strokes or acute findings in the brain.  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  TSH, B12, Vit D and HIV were all normal or negative between 06/2020 and 11/2020.       REVIEW OF SYSTEMS: Constitutional: No fevers, chills, sweats, or change in appetite Eyes: No visual changes, double vision, eye pain Ear, nose and throat: No hearing loss, ear pain, nasal congestion, sore  throat Cardiovascular: No chest pain, palpitations Respiratory:  No shortness of breath at rest or with exertion.   No wheezes GastrointestinaI: No nausea, vomiting, diarrhea, abdominal pain, fecal incontinence Genitourinary:  No dysuria, urinary retention or frequency.  No nocturia. Musculoskeletal:  No neck pain, back pain Integumentary: No rash, pruritus, skin lesions Neurological: as above Psychiatric: No depression at this time.  No anxiety Endocrine: No palpitations, diaphoresis, change in appetite, change in weigh or increased thirst Hematologic/Lymphatic:  No anemia, purpura, petechiae. Allergic/Immunologic: No itchy/runny eyes, nasal congestion, recent allergic reactions, rashes  ALLERGIES: Allergies  Allergen Reactions   Lamisil [Terbinafine]     Elevated liver enzymes (LFTs)    HOME MEDICATIONS:  Current Outpatient Medications:    acetaminophen (TYLENOL) 500 MG tablet, Take 1,000 mg by mouth every 8 (eight) hours as needed for moderate pain or headache., Disp: , Rfl:    amLODipine (NORVASC) 10 MG tablet, Take 1 tablet (10 mg total) by mouth daily., Disp: 90 tablet, Rfl: 3   amoxicillin (AMOXIL) 500 MG capsule, as directed. For dental appts, Disp: , Rfl:    Calcium Citrate-Vitamin D (CITRACAL + D PO), Take 2 tablets by mouth in the morning and at bedtime., Disp: , Rfl:    chlorthalidone (HYGROTON) 25 MG tablet, Take 1/2 tablet by mouth daily., Disp: 15 tablet, Rfl: 5   Cholecalciferol (DIALYVITE VITAMIN D 5000) 125 MCG (5000 UT) capsule, Take 5,000 Units by mouth daily., Disp: , Rfl:    ELIQUIS 5 MG TABS tablet, TAKE 1 TABLET(5 MG) BY MOUTH TWICE DAILY, Disp: 60 tablet, Rfl: 5   sertraline (ZOLOFT) 50 MG tablet, Take 1 tablet (50 mg total) by mouth daily., Disp: 30 tablet, Rfl: 11  PAST MEDICAL HISTORY: Past Medical History:  Diagnosis Date   Aortic stenosis    Benign prostatic hyperplasia    Bronchitis    Cataract    bilateral    CHF (congestive heart failure) (HCC)     Colon polyps    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   Heart murmur    History of fracture of vertebral column    Hyperlipidemia    Kidney stone    Nonrheumatic aortic valve stenosis    Osteoporosis    Osteoporosis    Pure hypercholesterolemia    RBBB    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   21 mm Edwards Inspiris Resilia stented bovine pericardial tissue valve via right mini thoracotomy approach  Sensorineural hearing loss of both ears    Thrombosed hemorrhoids    hsitory of   Tubular adenoma of colon    Vitamin D deficiency     PAST SURGICAL HISTORY: Past Surgical History:  Procedure Laterality Date   AORTIC VALVE REPLACEMENT N/A 07/23/2019   Procedure: MINIMALLY INVASIVE AORTIC VALVE REPLACEMENT (AVR) using Margaretha Sheffield Resilia 21 MM Aortic Valve.;  Surgeon: Rexene Alberts, MD;  Location: [place];  Service: Open Heart Surgery;  Laterality: N/A;   ATRIAL FIBRILLATION ABLATION N/A 03/15/2020   Procedure: ATRIAL FIBRILLATION ABLATION;  Surgeon: [name], MD;  Location: [place] [facility];  Service: Cardiovascular;  Laterality: N/A;   CARDIAC CATHETERIZATION     COLON SURGERY  2006   1 foot ascending colon removed    COLONOSCOPY     HERNIA REPAIR     PACEMAKER IMPLANT N/A 07/29/2019   Procedure: PACEMAKER IMPLANT;  Surgeon: [name], MD;  Location: [place] [facility];  Service: Cardiovascular;  Laterality: N/A;   POLYPECTOMY     RIGHT HEART CATH AND CORONARY ANGIOGRAPHY N/A 06/08/2019   Procedure: RIGHT HEART CATH AND CORONARY ANGIOGRAPHY;  Surgeon: Sherren Mocha, MD;  Location: [place] [facility];  Service: Cardiovascular;  Laterality: N/A;   TEE WITHOUT CARDIOVERSION N/A 07/23/2019   Procedure: TRANSESOPHAGEAL ECHOCARDIOGRAM (TEE);  Surgeon: Rexene Alberts, MD;  Location: [place];  Service: Open Heart Surgery;  Laterality: N/A;   TESTICLE SURGERY      FAMILY HISTORY: Family History  Problem Relation Foster of  Onset   Heart disease Maternal Uncle    Hypertension Brother    Colon cancer Neg Hx    Colon polyps Neg Hx    Esophageal cancer Neg Hx    Rectal cancer Neg Hx    Stomach cancer Neg Hx     SOCIAL HISTORY:  Social History   Socioeconomic History   Marital status: Single    Spouse name: Not on file   Number of children: Not on file   Years of education: Not on file   Highest education level: Professional school degree (e.g., MD, DDS, DVM, JD)  Occupational History   Occupation: Retired Pharmacist, community  Tobacco Use   Smoking status: Never   Smokeless tobacco: Never  Vaping Use   Vaping Use: Never used  Substance and Sexual Activity   Alcohol use: Yes    Alcohol/week: 6.0 - 8.0 standard drinks of alcohol    Types: 6 - 8 Glasses of wine per week    Comment: 2 glass 3-4 times a week 05/08/21   Drug use: No   Sexual activity: Not on file  Other Topics Concern   Not on file  Social History Narrative   Lives alone, currently has a friend living with him til around April   Left handed   Caffeine: 5 cups of coffee in the AM   Social Determinants of Health   Financial Resource Strain: Not on file  Food Insecurity: Not on file  Transportation Needs: Not on file  Physical Activity: Not on file  Stress: Not on file  Social Connections: Not on file  Intimate Partner Violence: Not on file     PHYSICAL EXAM  Vitals:   09/18/21 0927  BP: (!) 136/93  Pulse: 90  Weight: 161 lb (73 kg)  Height: 5' 9.5" (1.765 m)    Body mass index is 23.43 kg/m.   General: The patient is well-developed and well-nourished and in no acute distress  HEENT:  Head is [place]/AT.  Sclera are anicteric.    Neck: Good range of motion.  Skin: Extremities are without rash.  He has mild edema at the ankles.  Neurologic Exam  Mental status: The patient is alert and oriented x 3 at the time of the examination.  Details of the [facility] cognitive assessment or above.  Cranial nerves: Extraocular movements  are full.  Facial strength and sensation was normal.  The tongue is midline, and the patient has symmetric elevation of the soft palate. No obvious hearing deficits are noted.  Motor:  Muscle bulk is normal.   Tone is normal. Strength is  5 / 5 in all 4 extremities.   Sensory: Normal sensation to touch and temperature in the arms.  Probably normal sensation in the legs (mild asymmetry medially in the legs but symmetric elsewhere in the legs  Coordination: Cerebellar testing reveals good finger-nose-finger and heel-to-shin bilaterally.  Gait and station: Station is normal.   Gait is normal. Tandem gait is normal for Foster. Romberg is negative.   Reflexes: Deep tendon reflexes are symmetric and normal bilaterally.   Plantar responses are flexor.    DIAGNOSTIC DATA (LABS, IMAGING, TESTING) - I reviewed patient records, labs, notes, testing and imaging myself where available.  Lab Results  Component Value Date   WBC 7.7 02/22/2020   HGB 15.4 02/22/2020   HCT 44.6 02/22/2020   MCV 90 02/22/2020   PLT 140 (L) 02/22/2020      Component Value Date/Time   NA 139 08/15/2021 1455   K 3.8 08/15/2021 1455   CL 98 08/15/2021 1455   CO2 26 08/15/2021 1455   GLUCOSE 92 08/15/2021 1455   GLUCOSE 119 (H) 07/31/2019 0425   BUN 24 08/15/2021 1455   CREATININE 0.89 08/15/2021 1455   CALCIUM 9.8 08/15/2021 1455   PROT 6.7 09/07/2019 0931   ALBUMIN 4.0 09/07/2019 0931   AST 33 09/07/2019 0931   ALT 27 09/07/2019 0931   ALKPHOS 73 09/07/2019 0931   BILITOT 0.4 09/07/2019 0931   GFRNONAA 86 02/22/2020 1527   GFRAA 99 02/22/2020 1527   Lab Results  Component Value Date   HGBA1C 5.7 (H) 07/19/2019   No results found for: "VITAMINB12" Lab Results  Component Value Date   TSH 1.150 12/24/2019       ASSESSMENT AND PLAN  Memory loss  TIA (transient ischemic attack)  Complete heart block (HCC)  Numbness of tongue   His score on the Montreal cognitive assessment places him in the  mild cognitive impairment range.  (24/30).  Late last year he was low normal (27/30).  Of note, he did recall 2 of the 5 words 15 to 20 minutes later.  If he had done that during the test he would have scored in the normal range.  Regardless, he seems to be performing below his baseline (he is a retired Pharmacist, community).  A CT scan last year showed mild generalized cortical atrophy but no specific pattern.  There was not extreme vascular changes or evidence of a large vessel stroke at the time. He had a 15-second episode of speech impairment that was associated with numbness in the tongue.  Although nonspecific, this might have been a transient ischemic attack.  We will check an MRI of the brain and MR angiogram of the head and neck.  This would also allow [place] to better evaluate the brain for the cognitive issues.  He has a pacemaker and I will check with cardiology to determine if  any special instructions are needed for the MRI He has had some crying spells and I will have him start Zoloft in case mood issues are playing a role in his cognitive issues. If symptoms do not improve with the Zoloft, then we may want to consider PET scan or CSF analysis for Alzheimer markers.  We could also refer him to [facility] for their MIND study.  Empirically, donepezil and/or memantine could also be initiated. 5.   He will return to see [place] in 4 months or sooner if there are new or worsening neurologic symptoms.  48-minute office visit with the majority of the time spent face-to-face for history and physical, discussion/counseling and decision-making.  Additional time with record review and documentation.  Addendum: Pacemaker is MRI compatible and we can schedule the studies at [place]. Felecia Shelling, MD, [facility] 09/08/8370, 9:02 PM Certified in Neurology, Clinical Neurophysiology, Sleep Medicine and Neuroimaging  [facility] [address], [place], [place] [address] [phone]

## 2021-10-09 ENCOUNTER — Telehealth: Payer: Self-pay | Admitting: Neurology

## 2021-10-09 NOTE — Telephone Encounter (Signed)
MRI brain is approved, but they are requiring a peer to peer for the MRA head and MRA neck. I have already faxed in the office notes. Case number [id number] call [phone]

## 2021-10-15 NOTE — Telephone Encounter (Signed)
[employer] medicare Josem Kaufmann: [phone] exp. 10/09/21-11/07/21 sent to [facility]

## 2021-10-15 NOTE — Telephone Encounter (Signed)
This patient left a voice mail asking for an update on his MRI. Are you able to do a peer to peer?

## 2021-10-22 DIAGNOSIS — F32A Depression, unspecified: Secondary | ICD-10-CM | POA: Insufficient documentation

## 2021-10-29 ENCOUNTER — Emergency Department (HOSPITAL_COMMUNITY)
Admission: EM | Admit: 2021-10-29 | Discharge: 2021-10-29 | Discharge status: Against Medical Advice | Payer: Medicare Other | Attending: Emergency Medicine | Admitting: Emergency Medicine

## 2021-10-29 ENCOUNTER — Telehealth: Payer: Self-pay | Admitting: Neurology

## 2021-10-29 ENCOUNTER — Encounter (HOSPITAL_COMMUNITY): Payer: Self-pay | Admitting: Emergency Medicine

## 2021-10-29 ENCOUNTER — Emergency Department (HOSPITAL_COMMUNITY): Payer: Medicare Other

## 2021-10-29 DIAGNOSIS — Z5321 Procedure and treatment not carried out due to patient leaving prior to being seen by health care provider: Secondary | ICD-10-CM | POA: Diagnosis not present

## 2021-10-29 DIAGNOSIS — E876 Hypokalemia: Secondary | ICD-10-CM | POA: Diagnosis not present

## 2021-10-29 DIAGNOSIS — R410 Disorientation, unspecified: Secondary | ICD-10-CM | POA: Insufficient documentation

## 2021-10-29 DIAGNOSIS — R413 Other amnesia: Secondary | ICD-10-CM | POA: Insufficient documentation

## 2021-10-29 DIAGNOSIS — Z950 Presence of cardiac pacemaker: Secondary | ICD-10-CM | POA: Insufficient documentation

## 2021-10-29 DIAGNOSIS — D696 Thrombocytopenia, unspecified: Secondary | ICD-10-CM | POA: Insufficient documentation

## 2021-10-29 LAB — COMPREHENSIVE METABOLIC PANEL
ALT: 29 U/L (ref 0–44)
AST: 39 U/L (ref 15–41)
Albumin: 4.4 g/dL (ref 3.5–5.0)
Alkaline Phosphatase: 55 U/L (ref 38–126)
Anion gap: 12 (ref 5–15)
BUN: 16 mg/dL (ref 8–23)
CO2: 28 mmol/L (ref 22–32)
Calcium: 10.2 mg/dL (ref 8.9–10.3)
Chloride: 97 mmol/L — ABNORMAL LOW (ref 98–111)
Creatinine, Ser: 0.88 mg/dL (ref 0.61–1.24)
GFR, Estimated: >60 mL/min (ref >60)
Glucose, Bld: 121 mg/dL — ABNORMAL HIGH (ref 70–99)
Potassium: 3.2 mmol/L — ABNORMAL LOW (ref 3.5–5.1)
Sodium: 137 mmol/L (ref 135–145)
Total Bilirubin: 1.2 mg/dL (ref 0.3–1.2)
Total Protein: 7.5 g/dL (ref 6.5–8.1)

## 2021-10-29 LAB — CBC WITH DIFFERENTIAL/PLATELET
Abs Immature Granulocytes: 0.03 10*3/uL (ref 0.00–0.07)
Basophils Absolute: 0 10*3/uL (ref 0.0–0.1)
Basophils Relative: 0 %
Eosinophils Absolute: 0 10*3/uL (ref 0.0–0.5)
Eosinophils Relative: 0 %
HCT: 45.4 % (ref 39.0–52.0)
Hemoglobin: 15.9 g/dL (ref 13.0–17.0)
Immature Granulocytes: 0 %
Lymphocytes Relative: 17 %
Lymphs Abs: 1.7 10*3/uL (ref 0.7–4.0)
MCH: 30.9 pg (ref 26.0–34.0)
MCHC: 35 g/dL (ref 30.0–36.0)
MCV: 88.2 fL (ref 80.0–100.0)
Monocytes Absolute: 0.5 10*3/uL (ref 0.1–1.0)
Monocytes Relative: 5 %
Neutro Abs: 7.4 10*3/uL (ref 1.7–7.7)
Neutrophils Relative %: 78 %
Platelets: 124 10*3/uL — ABNORMAL LOW (ref 150–400)
RBC: 5.15 MIL/uL (ref 4.22–5.81)
RDW: 12.3 % (ref 11.5–15.5)
WBC: 9.6 10*3/uL (ref 4.0–10.5)
nRBC: 0 % (ref 0.0–0.2)

## 2021-10-29 LAB — CBG MONITORING, ED: Glucose-Capillary: 115 mg/dL — ABNORMAL HIGH (ref 70–99)

## 2021-10-29 NOTE — Telephone Encounter (Signed)
Called pt back. He noticed some "glitches in memory". This morning, got up, cannot remember a lot of weekend. Just came from [place] (7 people there). He can remember some of the people, but not everyone that was there. He called friend who brought up that this past Tuesday, went to [place] to meet with friend. He cannot remember this.  No new meds started recently.   Has MRI's scheduled for 12/17/21 at [facility]. Has pacemaker. States MRI scheduling got delayed d/t needing info for pacemaker first.   He is going to have friend bring him to [facility] to get checked out and see about getting MRI's completed while there. He is very concerned about worsening memory issues.

## 2021-10-29 NOTE — ED Notes (Signed)
Pt cannot be found in lobby

## 2021-10-29 NOTE — ED Triage Notes (Signed)
Patient here with complaint of not being able to recall events that happened earlier in the week, such as visiting [facility] and talking to staff but not being able to remember ever going there. Patient has no facial droop, no dysarthria, no arm drift. Patient is alert, oriented, and in no apparent distress at this time.

## 2021-10-29 NOTE — Telephone Encounter (Signed)
Pt has called out of concern re: not remembering major details of his weekend.  Pt declined sending a my chart message as he fears he wouldn't know what to really type as a result of the concerns with his memory.  Pt is aware of his upcoming appointment and would really like a call back more than anything else at this point.  Pt states he has experienced extensive memory loss.

## 2021-10-29 NOTE — ED Provider Triage Note (Signed)
Emergency Medicine Provider Triage Evaluation Note  Jerry Foster , a 71 y.o. male  was evaluated in triage.  Pt complains of memory loss.  Patient states he awakened this morning and felt that he can no longer remember what year it was and was having trouble remembering events that occurred over the weekend.  He denies any physical symptoms, weakness, slurred speech.  He states that he had some similar symptoms in June.  Patient had a CT head at that time which was apparently negative and was supposed to have an MRI but there was confusion due to the patient's pacemaker about his ability to have an MRI.  Patient states that since that time it has been found that his pacemaker is compatible with MRI.  Patient denies any recent trauma or injury.  Denies chest pain, shortness of breath, headache, vision changes.  Endorses memory loss which worsened this morning  Review of Systems  Positive: Memory loss Negative: Headache  Physical Exam  BP (!) 170/100   Pulse 84   Temp 98.4 F (36.9 C) (Oral)   Resp 16   SpO2 97%  Gen:   Awake, no distress   Resp:  Normal effort  MSK:   Moves extremities without difficulty  Other:    Medical Decision Making  Medically screening exam initiated at 12:56 PM.  Appropriate orders placed.  Jerry Foster was informed that the remainder of the evaluation will be completed by another provider, this initial triage assessment does not replace that evaluation, and the importance of remaining in the ED until their evaluation is complete.     Dorothyann Peng, PA-C 10/29/21 1258

## 2021-10-30 MED ORDER — POTASSIUM CHLORIDE CRYS ER 20 MEQ PO TBCR: 20.0000 meq | EXTENDED_RELEASE_TABLET | Freq: Two times a day (BID) | ORAL | 5 refills | Status: AC

## 2021-10-30 NOTE — Telephone Encounter (Signed)
Pt called again wanting to be advised on what he can do next now that he has gone to the ER. Pt states that he woke up very confused this morning.

## 2021-10-30 NOTE — Addendum Note (Signed)
Addended by: Wyvonnia Lora on: 10/30/2021 01:26 PM   Modules accepted: Orders

## 2021-10-30 NOTE — Telephone Encounter (Signed)
Jerry Foster in [facility] scheduling is checking on this for me

## 2021-10-30 NOTE — Telephone Encounter (Signed)
Called pt back. Relayed Dr. Garth Bigness message. He verbalized understanding. I provided phone# to [name] study.  E-scribed potassium rx. He would like to move up MRI's scheduled for 12/17/21. Aware I will send message to Tori to see if she can get that moved up. He asked that she call him. He expressed frustration on how long it took to get MRI scheduled d/t issue w/ getting pacemaker info.

## 2021-10-31 NOTE — Telephone Encounter (Signed)
He was moved up to 11/05/21

## 2021-11-02 ENCOUNTER — Ambulatory Visit (INDEPENDENT_AMBULATORY_CARE_PROVIDER_SITE_OTHER): Payer: Medicare Other

## 2021-11-02 DIAGNOSIS — I442 Atrioventricular block, complete: Secondary | ICD-10-CM | POA: Diagnosis not present

## 2021-11-02 LAB — CUP PACEART REMOTE DEVICE CHECK
Battery Remaining Longevity: 107 mo
Battery Remaining Percentage: 82 %
Battery Voltage: 3.02 V
Brady Statistic AP VP Percent: 1 %
Brady Statistic AP VS Percent: 14 %
Brady Statistic AS VP Percent: 1 %
Brady Statistic AS VS Percent: 86 %
Brady Statistic RA Percent Paced: 13 %
Brady Statistic RV Percent Paced: 1 %
Date Time Interrogation Session: 20230811020038
Implantable Lead Implant Date: 20210506
Implantable Lead Implant Date: 20210506
Implantable Lead Location: 753859
Implantable Lead Location: 753860
Implantable Pulse Generator Implant Date: 20210506
Lead Channel Impedance Value: 430 Ohm
Lead Channel Impedance Value: 460 Ohm
Lead Channel Pacing Threshold Amplitude: 0.5 V
Lead Channel Pacing Threshold Amplitude: 1 V
Lead Channel Pacing Threshold Pulse Width: 0.4 ms
Lead Channel Pacing Threshold Pulse Width: 0.4 ms
Lead Channel Sensing Intrinsic Amplitude: 4.1 mV
Lead Channel Sensing Intrinsic Amplitude: 4.5 mV
Lead Channel Setting Pacing Amplitude: 1.25 V
Lead Channel Setting Pacing Amplitude: 1.5 V
Lead Channel Setting Pacing Pulse Width: 0.4 ms
Lead Channel Setting Sensing Sensitivity: 0.7 mV
Pulse Gen Model: 2272
Pulse Gen Serial Number: 3825759

## 2021-11-05 ENCOUNTER — Ambulatory Visit (HOSPITAL_COMMUNITY)
Admission: RE | Admit: 2021-11-05 | Discharge: 2021-11-05 | Disposition: A | Discharge status: Home / Self Care | Payer: Medicare Other | Source: Ambulatory Visit | Attending: Neurology | Admitting: Neurology

## 2021-11-05 DIAGNOSIS — G459 Transient cerebral ischemic attack, unspecified: Secondary | ICD-10-CM | POA: Diagnosis not present

## 2021-11-05 DIAGNOSIS — Z950 Presence of cardiac pacemaker: Secondary | ICD-10-CM | POA: Diagnosis present

## 2021-11-05 DIAGNOSIS — R413 Other amnesia: Secondary | ICD-10-CM | POA: Diagnosis present

## 2021-11-05 MED ORDER — GADOBUTROL 1 MMOL/ML IV SOLN
7.5000 mL | Freq: Once | INTRAVENOUS | Status: AC | PRN
  Administered 2021-11-05: 7.5 mL via INTRAVENOUS

## 2021-11-05 NOTE — Progress Notes (Signed)
Patient here today at [facility] for MRI brain wo contrast, angio head/neck w wo contrast. Patient has [employer] device. Transmission sent. Orders received for DOO 85. Will re-program once scan is completed.

## 2021-11-06 ENCOUNTER — Encounter (HOSPITAL_COMMUNITY): Payer: Self-pay | Admitting: Physician Assistant

## 2021-11-06 ENCOUNTER — Ambulatory Visit (HOSPITAL_COMMUNITY)
Admission: RE | Admit: 2021-11-06 | Discharge: 2021-11-06 | Disposition: A | Discharge status: Home / Self Care | Payer: Medicare Other | Source: Ambulatory Visit | Attending: Physician Assistant | Admitting: Physician Assistant

## 2021-11-06 VITALS — BP 106/80 | HR 79 | Ht 69.5 in | Wt 159.8 lb

## 2021-11-06 DIAGNOSIS — I10 Essential (primary) hypertension: Secondary | ICD-10-CM | POA: Insufficient documentation

## 2021-11-06 DIAGNOSIS — I4892 Unspecified atrial flutter: Secondary | ICD-10-CM | POA: Insufficient documentation

## 2021-11-06 DIAGNOSIS — I4510 Unspecified right bundle-branch block: Secondary | ICD-10-CM | POA: Diagnosis not present

## 2021-11-06 DIAGNOSIS — E785 Hyperlipidemia, unspecified: Secondary | ICD-10-CM | POA: Insufficient documentation

## 2021-11-06 DIAGNOSIS — Z7901 Long term (current) use of anticoagulants: Secondary | ICD-10-CM | POA: Diagnosis not present

## 2021-11-06 DIAGNOSIS — I350 Nonrheumatic aortic (valve) stenosis: Secondary | ICD-10-CM | POA: Insufficient documentation

## 2021-11-06 DIAGNOSIS — I2510 Atherosclerotic heart disease of native coronary artery without angina pectoris: Secondary | ICD-10-CM | POA: Diagnosis not present

## 2021-11-06 DIAGNOSIS — D6869 Other thrombophilia: Secondary | ICD-10-CM | POA: Insufficient documentation

## 2021-11-06 DIAGNOSIS — I4819 Other persistent atrial fibrillation: Secondary | ICD-10-CM | POA: Diagnosis present

## 2021-11-06 NOTE — Progress Notes (Signed)
Primary Care Physician: Jefm Petty, MD Primary Cardiologist: Dr Burt Knack Primary Electrophysiologist: Dr Rayann Heman Referring Physician: Dr [name] Jerry Foster is a 71 y.o. male with a history of RBBB, AS s/p AVR, HLD, tachybradycardia syndrome, transient CHB s/p PPM, persistent atrial fibrillation, and atrial flutter who presents for follow up in the [facility]. The patient was admitted electively and on 07/23/2019 he was taken the operating room where he underwent AVR.  He did develop postoperative atrial fibrillation as well as significant heart block. On 07/28/2019 he had a fall in the bathroom that was felt to be related to the heart block and sustained a significant laceration in the region of his left eyebrow requiring plastic surgery repair. His CT scan was also done at that time and showed an avulsion fracture the anterior inferior aspect of the C6 vertebral body. He was placed in a soft cervical immobilizer. CT scan of the head was unremarkable. Neurosurgery will follow up as an outpatient. He was seen by Dr Rayann Heman and a PPM was implanted. He has been started on Coumadin with a CHADS2VASC score of 2. He was also started on amiodarone and converted to SR prior to discharge. His amiodarone and warfarin were eventually discontinued as he had no recurrence of afib.  Patient is s/p afib and flutter ablation with Dr Rayann Heman on 03/15/20.   On follow up today, patient reports that he is doing well from a cardiac standpoint. His PPM shows 4.8% afib burden. He is having memory issues and is scheduled to see neurology. No bleeding issues on anticoagulation.   Today, he denies symptoms of palpitations, shortness of breath, orthopnea, PND, lower extremity edema, syncope, snoring, daytime somnolence, bleeding, or neurologic sequela. The patient is tolerating medications without difficulties and is otherwise without complaint today.    Atrial Fibrillation Risk Factors:  he  does not have symptoms or diagnosis of sleep apnea.   he has a BMI of Body mass index is 23.26 kg/m.[name] Filed Weights   11/06/21 0910  Weight: 72.5 kg    Family History  Problem Relation Age of Onset   Heart disease Maternal Uncle    Hypertension Brother    Colon cancer Neg Hx    Colon polyps Neg Hx    Esophageal cancer Neg Hx    Rectal cancer Neg Hx    Stomach cancer Neg Hx      Atrial Fibrillation Management history:  Previous antiarrhythmic drugs: amiodarone Previous cardioversions: none Previous ablations: 03/15/20 afib and flutter CHADS2VASC score: 2 Anticoagulation history: warfarin, Eliquis   Past Medical History:  Diagnosis Date   Aortic stenosis    Benign prostatic hyperplasia    Bronchitis    Cataract    bilateral    CHF (congestive heart failure) (HCC)    Colon polyps    Complete heart block ([place]) 07/28/2019   Heart murmur    History of fracture of vertebral column    Hyperlipidemia    Kidney stone    Nonrheumatic aortic valve stenosis    Osteoporosis    Osteoporosis    Pure hypercholesterolemia    RBBB    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   21 mm Edwards Inspiris Resilia stented bovine pericardial tissue valve via right mini thoracotomy approach   Sensorineural hearing loss of both ears    Thrombosed hemorrhoids    hsitory of   Tubular adenoma of colon    Vitamin D deficiency  Past Surgical History:  Procedure Laterality Date   AORTIC VALVE REPLACEMENT N/A 07/23/2019   Procedure: MINIMALLY INVASIVE AORTIC VALVE REPLACEMENT (AVR) using Margaretha Sheffield Resilia 21 MM Aortic Valve.;  Surgeon: Rexene Alberts, MD;  Location: [place];  Service: Open Heart Surgery;  Laterality: N/A;   ATRIAL FIBRILLATION ABLATION N/A 03/15/2020   Procedure: ATRIAL FIBRILLATION ABLATION;  Surgeon: [name], MD;  Location: [place] [facility];  Service: Cardiovascular;  Laterality: N/A;   CARDIAC CATHETERIZATION      COLON SURGERY  2006   1 foot ascending colon removed    COLONOSCOPY     HERNIA REPAIR     PACEMAKER IMPLANT N/A 07/29/2019   Procedure: PACEMAKER IMPLANT;  Surgeon: [name], MD;  Location: [place] [facility];  Service: Cardiovascular;  Laterality: N/A;   POLYPECTOMY     RIGHT HEART CATH AND CORONARY ANGIOGRAPHY N/A 06/08/2019   Procedure: RIGHT HEART CATH AND CORONARY ANGIOGRAPHY;  Surgeon: Sherren Mocha, MD;  Location: [place] [facility];  Service: Cardiovascular;  Laterality: N/A;   TEE WITHOUT CARDIOVERSION N/A 07/23/2019   Procedure: TRANSESOPHAGEAL ECHOCARDIOGRAM (TEE);  Surgeon: Rexene Alberts, MD;  Location: [place];  Service: Open Heart Surgery;  Laterality: N/A;   TESTICLE SURGERY      Current Outpatient Medications  Medication Sig Dispense Refill   acetaminophen (TYLENOL) 500 MG tablet Take 1,000 mg by mouth every 8 (eight) hours as needed for moderate pain or headache.     amLODipine (NORVASC) 10 MG tablet Take 1 tablet (10 mg total) by mouth daily. 90 tablet 3   amoxicillin (AMOXIL) 500 MG capsule as directed. For dental appts     Calcium Citrate-Vitamin D (CITRACAL + D PO) Take 2 tablets by mouth in the morning and at bedtime.     chlorthalidone (HYGROTON) 25 MG tablet Take 1/2 tablet by mouth daily. 15 tablet 5   Cholecalciferol (DIALYVITE VITAMIN D 5000) 125 MCG (5000 UT) capsule Take 5,000 Units by mouth daily.     ELIQUIS 5 MG TABS tablet TAKE 1 TABLET(5 MG) BY MOUTH TWICE DAILY 60 tablet 5   Omega-3 1000 MG CAPS Take by mouth.     potassium chloride SA (KLOR-CON M) 20 MEQ tablet Take 1 tablet (20 mEq total) by mouth 2 (two) times daily. 30 tablet 5   pravastatin (PRAVACHOL) 40 MG tablet Take 40 mg by mouth daily.     sertraline (ZOLOFT) 50 MG tablet Take 1 tablet (50 mg total) by mouth daily. 30 tablet 11   No current facility-administered medications for this encounter.    Allergies  Allergen Reactions   Lamisil [Terbinafine]     Elevated liver enzymes (LFTs)     Social History   Socioeconomic History   Marital status: Single    Spouse name: Not on file   Number of children: Not on file   Years of education: Not on file   Highest education level: Professional school degree (e.g., MD, DDS, DVM, JD)  Occupational History   Occupation: Retired Pharmacist, community  Tobacco Use   Smoking status: Never   Smokeless tobacco: Never   Tobacco comments:    Never smoke 11/06/21  Vaping Use   Vaping Use: Never used  Substance and Sexual Activity   Alcohol use: Yes    Alcohol/week: 6.0 - 8.0 standard drinks of alcohol    Types: 6 - 8 Glasses of wine per week    Comment: 2 glass 3-4 times a week 05/08/21   Drug use:  No   Sexual activity: Not on file  Other Topics Concern   Not on file  Social History Narrative   Lives alone, currently has a friend living with him til around April   Left handed   Caffeine: 5 cups of coffee in the AM   Social Determinants of Health   Financial Resource Strain: Not on file  Food Insecurity: Not on file  Transportation Needs: Not on file  Physical Activity: Not on file  Stress: Not on file  Social Connections: Not on file  Intimate Partner Violence: Not on file     ROS- All systems are reviewed and negative except as per the HPI above.  Physical Exam: Vitals:   11/06/21 0910  BP: 106/80  Pulse: 79  Weight: 72.5 kg  Height: 5' 9.5" (1.765 m)     GEN- The patient is a well appearing male, alert and oriented x 3 today.   HEENT-head normocephalic, atraumatic, sclera clear, conjunctiva pink, hearing intact, trachea midline. Lungs- Clear to ausculation bilaterally, normal work of breathing Heart- Regular rate and rhythm, no murmurs, rubs or gallops  GI- soft, NT, ND, + BS Extremities- no clubbing, cyanosis, or edema MS- no significant deformity or atrophy Skin- no rash or lesion Psych- euthymic mood, full affect Neuro- strength and sensation are intact   Wt Readings from Last 3 Encounters:  11/06/21 72.5  kg  09/18/21 73 kg  05/08/21 73.1 kg    EKG today demonstrates  SR, RBBB Vent. rate 79 BPM PR interval 178 ms QRS duration 160 ms QT/QTcB 436/499 ms  Echo 12/08/19 demonstrated  1. Left ventricular ejection fraction, by estimation, is 60 to 65%. The  left ventricle has normal function. The left ventricle has no regional  wall motion abnormalities. There is moderate left ventricular hypertrophy. Left ventricular diastolic parameters are consistent with Grade I diastolic dysfunction (impaired relaxation).   2. Right ventricular systolic function is normal. The right ventricular  size is normal.   3. The aortic valve has been repaired/replaced. There is a 21 mm Edwards Inspiris Resilia valve present in the aortic position. Echo findings are consistent with normal structure and function of the aortic valve prosthesis. Aortic valve area, by VTI measures 1.23 cm. Aortic valve mean gradient measures 15.0 mmHg. Aortic valve Vmax measures 2.71 m/s. Peak gradient 29 mmHg. DI 0.36.   4. The inferior vena cava is dilated in size with >50% respiratory  variability, suggesting right atrial pressure of 8 mmHg.   Comparison(s): Changes from prior study are noted. 09/14/2019: LVEF 50-55%, 21 mm Edwards Inspiris Resilia valve, mean gradient 8.5 mmHg.  Epic records are reviewed at length today  CHA2DS2-VASc Score = 2  The patient's score is based upon: CHF History: 0 HTN History: 1 Diabetes History: 0 Stroke History: 0 Vascular Disease History: 0 Age Score: 1 Gender Score: 0      ASSESSMENT AND PLAN: 1. Persistent Atrial Fibrillation/atrial flutter The patient's CHA2DS2-VASc score is 2, indicating a 2.2% annual risk of stroke.   S/p afib and flutter ablation 03/15/20 AF burden on device 4.8%, longest episode 4.5 minutes. Continue Eliquis 5 mg BID  2. Secondary Hypercoagulable State (ICD10:  D68.69) The patient is at significant risk for stroke/thromboembolism based upon his CHA2DS2-VASc  Score of 2.  Continue Apixaban (Eliquis).   3. Transient CHB S/p PPM, followed by the device clinic.  4. Aortic stenosis S/p bioprosthetic AVR 06/2019.  5. CAD Nonobstructive by cath 05/2019 No anginal symptoms.  6. HTN Stable, no  changes today.   Follow up with Dr Burt Knack and Dr Rayann Heman per recalls. AF clinic in one year.    [facility] [address] [place], [place] [address] [phone] 11/06/2021 3:45 PM

## 2021-11-27 NOTE — Progress Notes (Signed)
Remote pacemaker transmission.   

## 2021-12-07 DIAGNOSIS — D696 Thrombocytopenia, unspecified: Secondary | ICD-10-CM | POA: Insufficient documentation

## 2021-12-17 ENCOUNTER — Ambulatory Visit (HOSPITAL_COMMUNITY): Payer: Medicare Other

## 2021-12-17 ENCOUNTER — Other Ambulatory Visit (HOSPITAL_COMMUNITY): Payer: Medicare Other

## 2022-01-09 ENCOUNTER — Ambulatory Visit: Payer: Medicare Other | Admitting: Neurology

## 2022-01-09 ENCOUNTER — Encounter: Payer: Self-pay | Admitting: Neurology

## 2022-01-09 ENCOUNTER — Other Ambulatory Visit: Payer: Self-pay

## 2022-01-09 VITALS — BP 130/72 | HR 73 | Ht 69.0 in | Wt 164.5 lb

## 2022-01-09 DIAGNOSIS — G309 Alzheimer's disease, unspecified: Secondary | ICD-10-CM

## 2022-01-09 DIAGNOSIS — I4819 Other persistent atrial fibrillation: Secondary | ICD-10-CM

## 2022-01-09 DIAGNOSIS — R42 Dizziness and giddiness: Secondary | ICD-10-CM

## 2022-01-09 DIAGNOSIS — Z953 Presence of xenogenic heart valve: Secondary | ICD-10-CM

## 2022-01-09 DIAGNOSIS — G3184 Mild cognitive impairment, so stated: Secondary | ICD-10-CM

## 2022-01-09 DIAGNOSIS — R413 Other amnesia: Secondary | ICD-10-CM | POA: Diagnosis not present

## 2022-01-09 MED ORDER — CHLORTHALIDONE 25 MG PO TABS: ORAL_TABLET | ORAL | 2 refills | Status: DC

## 2022-01-09 NOTE — Progress Notes (Addendum)
[facility]  PATIENT: Jerry Jerry Foster DOB: 1950-06-12  REFERRING DOCTOR OR PCP: Jerry Jerry Foster SOURCE: Patient, notes from primary care, laboratory reports.  _________________________________   HISTORICAL  CHIEF COMPLAINT:  Chief Complaint  Patient presents with   Follow-up    Pt in room #1 with friend (Jerry Foster). Pt here today for memory f/u.    HISTORY OF PRESENT ILLNESS:  Jerry Jerry Foster, at [facility] for neurologic consultation regarding his memory loss and episodes of dizziness.  UPDATE 01/09/2022: He continues to note difficulty with his memory.    He has gotten lost near his home.  He now needs to use GPS more.   He is still reading and has memory of some of what he reads but not everything.   He spent a few hours at a friends new home but could not remember anything about the visit.    He needs to write things down more.  He has more trouble getting names out sometimes.   He always does better with hints.      He scored 26/30 on the [facility] cognitive assessment today which is consistent with mild cognitive impairment.  He lost 3 points for delayed recall.  However, about 15 to 20 minutes later when I asked him about the 5 words, he got 3 correct without a prompt and an additional 1 with a category prompt..  Of note, he is educated (retired Pharmacist, community) so a score of 26 is certainly abnormal.       Earlier this year, because he had snoring and mild excessive daytime sleepiness we ordered a home sleep study.  It did not show significant sleep apnea.  He has not noted more episodes of dizziness.  At MR angiogram was fine.  He had a AVR in 07/2019 followed by cardiac arrest and pacemaker placement. The surgery was performed with cardioplegia pump (time 159 minutes).  He developed AFib and had ablation January 2022 with good results.      His cardiologist is Dr. Burt Foster.      01/09/2022    9:48 AM 09/18/2021    9:28 AM 03/20/2021    8:35 AM   Montreal Cognitive Assessment   Visuospatial/ Executive (0/5) '5 5 4  '$ Naming (0/3) '3 3 3  '$ Attention: Read list of digits (0/2) '2 2 2  '$ Attention: Read list of letters (0/1) '1 1 1  '$ Attention: Serial 7 subtraction starting at 100 (0/3) '2 3 3  '$ Language: Repeat phrase (0/2) '2 2 2  '$ Language : Fluency (0/1) 1 0 1  Abstraction (0/2) '2 2 2  '$ Delayed Recall (0/5) 2 0 3  Orientation (0/6) '6 6 6  '$ Total '26 24 27  '$ Adjusted Score (based on education)  24      His mother and several aunts and maternal GM had Alzheimer.   His mother started having issues at Jerry Foster 33 while others were in 12's and 55's Is father lived to late 70's with no cognitive issues.     Data: I personally reviewed the CT scan from 07/28/2019.  It shows mild generalized cortical atrophy that is typical for Jerry Foster.  There do not appear to be any large vessel strokes or acute findings in the brain.  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  MRI of the brain 11/05/2021 shows mild generalized cortical atrophy, possibly a little more pronounced in the medial temporal lobes.  It is unchanged compared to  the CT scan from 07/28/2019 and 04/12/2021.  TSH, B12, Vit D and HIV were all normal or negative between 06/2020 and 11/2020.       REVIEW OF SYSTEMS: Constitutional: No fevers, chills, sweats, or change in appetite Eyes: No visual changes, double vision, eye pain Ear, nose and throat: No hearing loss, ear pain, nasal congestion, sore throat Cardiovascular: No chest pain, palpitations Respiratory:  No shortness of breath at rest or with exertion.   No wheezes GastrointestinaI: No nausea, vomiting, diarrhea, abdominal pain, fecal incontinence Genitourinary:  No dysuria, urinary retention or frequency.  No nocturia. Musculoskeletal:  No neck pain, back pain Integumentary: No rash, pruritus, skin lesions Neurological: as above Psychiatric: No depression at this time.  No  anxiety Endocrine: No palpitations, diaphoresis, change in appetite, change in weigh or increased thirst Hematologic/Lymphatic:  No anemia, purpura, petechiae. Allergic/Immunologic: No itchy/runny eyes, nasal congestion, recent allergic reactions, rashes  ALLERGIES: Allergies  Allergen Reactions   Lamisil [Terbinafine]     Elevated liver enzymes (LFTs)    HOME MEDICATIONS:  Current Outpatient Medications:    acetaminophen (TYLENOL) 500 MG tablet, Take 1,000 mg by mouth every 8 (eight) hours as needed for moderate pain or headache., Disp: , Rfl:    amLODipine (NORVASC) 10 MG tablet, Take 1 tablet (10 mg total) by mouth daily., Disp: 90 tablet, Rfl: 3   amoxicillin (AMOXIL) 500 MG capsule, as directed. For dental appts, Disp: , Rfl:    Calcium Citrate-Vitamin D (CITRACAL + D PO), Take 2 tablets by mouth in the morning and at bedtime., Disp: , Rfl:    chlorthalidone (HYGROTON) 25 MG tablet, Take 1/2 tablet by mouth daily., Disp: 15 tablet, Rfl: 2   Cholecalciferol (DIALYVITE VITAMIN D 5000) 125 MCG (5000 UT) capsule, Take 5,000 Units by mouth daily., Disp: , Rfl:    ELIQUIS 5 MG TABS tablet, TAKE 1 TABLET(5 MG) BY MOUTH TWICE DAILY, Disp: 60 tablet, Rfl: 5   Omega-3 1000 MG CAPS, Take by mouth., Disp: , Rfl:    pravastatin (PRAVACHOL) 40 MG tablet, Take 40 mg by mouth daily., Disp: , Rfl:    sertraline (ZOLOFT) 50 MG tablet, Take 1 tablet (50 mg total) by mouth daily., Disp: 30 tablet, Rfl: 11   potassium chloride SA (KLOR-CON M) 20 MEQ tablet, Take 1 tablet (20 mEq total) by mouth 2 (two) times daily. (Patient not taking: Reported on 01/09/2022), Disp: 30 tablet, Rfl: 5  PAST MEDICAL HISTORY: Past Medical History:  Diagnosis Date   Aortic stenosis    Benign prostatic hyperplasia    Bronchitis    Cataract    bilateral    CHF (congestive heart failure) (HCC)    Colon polyps    Complete heart block ([place]) 07/28/2019   Heart murmur    History of fracture of vertebral column     Hyperlipidemia    Kidney stone    Nonrheumatic aortic valve stenosis    Osteoporosis    Osteoporosis    Pure hypercholesterolemia    RBBB    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   21 mm Edwards Inspiris Resilia stented bovine pericardial tissue valve via right mini thoracotomy approach   Sensorineural hearing loss of both ears    Thrombosed hemorrhoids    hsitory of   Tubular adenoma of colon    Vitamin D deficiency     PAST SURGICAL HISTORY: Past Surgical History:  Procedure Laterality Date   AORTIC VALVE REPLACEMENT N/A 07/23/2019   Procedure:  MINIMALLY INVASIVE AORTIC VALVE REPLACEMENT (AVR) using Margaretha Sheffield Resilia 21 MM Aortic Valve.;  Surgeon: Rexene Alberts, Foster;  Location: [place];  Service: Open Heart Surgery;  Laterality: N/A;   ATRIAL FIBRILLATION ABLATION N/A 03/15/2020   Procedure: ATRIAL FIBRILLATION ABLATION;  Surgeon: [name], Foster;  Location: [place] [facility];  Service: Cardiovascular;  Laterality: N/A;   CARDIAC CATHETERIZATION     COLON SURGERY  2006   1 foot ascending colon removed    COLONOSCOPY     HERNIA REPAIR     PACEMAKER IMPLANT N/A 07/29/2019   Procedure: PACEMAKER IMPLANT;  Surgeon: [name], Foster;  Location: [place] [facility];  Service: Cardiovascular;  Laterality: N/A;   POLYPECTOMY     RIGHT HEART CATH AND CORONARY ANGIOGRAPHY N/A 06/08/2019   Procedure: RIGHT HEART CATH AND CORONARY ANGIOGRAPHY;  Surgeon: Sherren Mocha, Foster;  Location: [place] [facility];  Service: Cardiovascular;  Laterality: N/A;   TEE WITHOUT CARDIOVERSION N/A 07/23/2019   Procedure: TRANSESOPHAGEAL ECHOCARDIOGRAM (TEE);  Surgeon: Rexene Alberts, Foster;  Location: [place];  Service: Open Heart Surgery;  Laterality: N/A;   TESTICLE SURGERY      FAMILY HISTORY: Family History  Problem Relation Jerry Foster of Onset   Heart disease Maternal Uncle    Hypertension Brother    Colon cancer Neg Hx    Colon polyps Neg Hx    Esophageal  cancer Neg Hx    Rectal cancer Neg Hx    Stomach cancer Neg Hx     SOCIAL HISTORY:  Social History   Socioeconomic History   Marital status: Single    Spouse name: Not on file   Number of children: Not on file   Years of education: Not on file   Highest education level: Professional school degree (e.g., Foster, DDS, DVM, JD)  Occupational History   Occupation: Retired Pharmacist, community  Tobacco Use   Smoking status: Never   Smokeless tobacco: Never   Tobacco comments:    Never smoke 11/06/21  Vaping Use   Vaping Use: Never used  Substance and Sexual Activity   Alcohol use: Yes    Alcohol/week: 6.0 - 8.0 standard drinks of alcohol    Types: 6 - 8 Glasses of wine per week    Comment: 2 glass 3-4 times a week 05/08/21   Drug use: No   Sexual activity: Not on file  Other Topics Concern   Not on file  Social History Narrative   Lives alone, currently has a friend living with him til around April   Left handed   Caffeine: 5 cups of coffee in the AM   Social Determinants of Health   Financial Resource Strain: Not on file  Food Insecurity: Not on file  Transportation Needs: Not on file  Physical Activity: Not on file  Stress: Not on file  Social Connections: Not on file  Intimate Partner Violence: Not on file     PHYSICAL EXAM  Vitals:   01/09/22 0936  BP: 130/72  Pulse: 73  Weight: 164 lb 8 oz (74.6 kg)  Height: '5\' 9"'$  (1.753 m)    Body mass index is 24.29 kg/m.   General: The patient is well-developed and well-nourished and in no acute distress  HEENT:  Head is [place]/AT.  Sclera are anicteric.    Neck: Good range of motion.  Skin: Extremities are without rash.  He has mild edema at the ankles.  Neurologic Exam  Mental status: The patient is alert and oriented  x 3 at the time of the examination.  Details of the [facility] cognitive assessment or above.  Of note, he missed 3 points for short-term recall and one-point for calculations  Cranial nerves: Extraocular  movements are full.  Facial strength and sensation was normal.  The tongue is midline, and the patient has symmetric elevation of the soft palate. No obvious hearing deficits are noted.  Motor:  Muscle bulk is normal.   Tone is normal. Strength is  5 / 5 in all 4 extremities.   Sensory: Normal sensation to touch and temperature in the arms.  Probably normal sensation in the legs (mild asymmetry medially in the legs but symmetric elsewhere in the legs  Coordination: Cerebellar testing reveals good finger-nose-finger and heel-to-shin bilaterally.  Gait and station: Station is normal.   Gait is normal. Tandem gait is normal for Jerry Foster. Romberg is negative.   Reflexes: Deep tendon reflexes are symmetric and normal bilaterally.   Plantar responses are flexor.    DIAGNOSTIC DATA (LABS, IMAGING, TESTING) - I reviewed patient records, labs, notes, testing and imaging myself where available.  Lab Results  Component Value Date   WBC 9.6 10/29/2021   HGB 15.9 10/29/2021   HCT 45.4 10/29/2021   MCV 88.2 10/29/2021   PLT 124 (L) 10/29/2021      Component Value Date/Time   NA 137 10/29/2021 1304   NA 139 08/15/2021 1455   K 3.2 (L) 10/29/2021 1304   CL 97 (L) 10/29/2021 1304   CO2 28 10/29/2021 1304   GLUCOSE 121 (H) 10/29/2021 1304   BUN 16 10/29/2021 1304   BUN 24 08/15/2021 1455   CREATININE 0.88 10/29/2021 1304   CALCIUM 10.2 10/29/2021 1304   PROT 7.5 10/29/2021 1304   PROT 6.7 09/07/2019 0931   ALBUMIN 4.4 10/29/2021 1304   ALBUMIN 4.0 09/07/2019 0931   AST 39 10/29/2021 1304   ALT 29 10/29/2021 1304   ALKPHOS 55 10/29/2021 1304   BILITOT 1.2 10/29/2021 1304   BILITOT 0.4 09/07/2019 0931   GFRNONAA >60 10/29/2021 1304   GFRAA 99 02/22/2020 1527   Lab Results  Component Value Date   HGBA1C 5.7 (H) 07/19/2019   No results found for: "VITAMINB12" Lab Results  Component Value Date   TSH 1.150 12/24/2019       ASSESSMENT AND PLAN  Alzheimer's disease, unspecified  (CODE) ([place]) - Plan: NM PET Brain Amyloid  Memory loss  Persistent atrial fibrillation (HCC)  S/P minimally-invasive aortic valve replacement with bioprosthetic valve  Dizziness  Mild cognitive impairment   He scored 26/30 on the Montreal cognitive assessment consistent with mild cognitive impairment that is stable compared to earlier this year and last year.  We had a long discussion about options.  I discussed that there is a new Alzheimer's medication approved for people with mild cognitive impairment you have proof of amyloid pathology.  He would like to get an amyloid PET scan and would be interested in this treatment if the PET scan is consistent with Alzheimer's disease.  We discussed the risks including microhemorrhages, superficial siderosis, ARIA-E and bleeds.,  He is on Eliquis and he could have higher risk. Stay active and exercise as tolerated. 5.   He will return to see [place] in 4 months or sooner if there are new or worsening neurologic symptoms.  45-minute office visit with the majority of the time spent face-to-face for history and physical, discussion/counseling and decision-making.  Additional time with record review and documentation.  Addendum: Pacemaker  is MRI compatible and we can schedule the studies at [place]. Felecia Shelling, Foster, [facility] 19/50/9326, 7:12 AM Certified in Neurology, Clinical Neurophysiology, Sleep Medicine and Neuroimaging  [facility] [address] [place], [place] [address] [phone]

## 2022-01-10 ENCOUNTER — Other Ambulatory Visit: Payer: Self-pay | Admitting: Cardiovascular Disease

## 2022-01-11 ENCOUNTER — Telehealth: Payer: Self-pay | Admitting: Neurology

## 2022-01-11 NOTE — Telephone Encounter (Signed)
[employer] medicare Josem Kaufmann: [id number] exp. 01/11/22-02/09/22 sent to [facility]

## 2022-01-23 NOTE — Telephone Encounter (Signed)
New CPT code approved [id number]       Authorized  Approval Valid Through: 01/23/2022 - 02/21/2022

## 2022-01-23 NOTE — Telephone Encounter (Signed)
Today Jerry Foster was told the auth on file was under the wrong CPT code for pet scan. I submitted a new [name] for CPT code [id number]       In Progress  Anticipated Determination Date: 02/01/2022

## 2022-01-25 ENCOUNTER — Encounter (HOSPITAL_COMMUNITY)
Admission: RE | Admit: 2022-01-25 | Discharge: 2022-01-25 | Disposition: A | Discharge status: Home / Self Care | Payer: Medicare Other | Source: Ambulatory Visit | Attending: Neurology | Admitting: Neurology

## 2022-01-25 DIAGNOSIS — R9402 Abnormal brain scan: Secondary | ICD-10-CM | POA: Insufficient documentation

## 2022-01-25 DIAGNOSIS — G308 Other Alzheimer's disease: Secondary | ICD-10-CM | POA: Insufficient documentation

## 2022-01-25 DIAGNOSIS — G319 Degenerative disease of nervous system, unspecified: Secondary | ICD-10-CM | POA: Diagnosis not present

## 2022-01-25 DIAGNOSIS — G3184 Mild cognitive impairment, so stated: Secondary | ICD-10-CM | POA: Diagnosis not present

## 2022-01-25 DIAGNOSIS — G309 Alzheimer's disease, unspecified: Secondary | ICD-10-CM | POA: Insufficient documentation

## 2022-01-25 DIAGNOSIS — R269 Unspecified abnormalities of gait and mobility: Secondary | ICD-10-CM | POA: Diagnosis not present

## 2022-01-25 DIAGNOSIS — R413 Other amnesia: Secondary | ICD-10-CM | POA: Diagnosis not present

## 2022-01-25 DIAGNOSIS — E859 Amyloidosis, unspecified: Secondary | ICD-10-CM | POA: Insufficient documentation

## 2022-01-25 DIAGNOSIS — F0280 Dementia in other diseases classified elsewhere without behavioral disturbance: Secondary | ICD-10-CM | POA: Insufficient documentation

## 2022-01-25 DIAGNOSIS — R42 Dizziness and giddiness: Secondary | ICD-10-CM | POA: Insufficient documentation

## 2022-01-25 MED ORDER — FLUDEOXYGLUCOSE F - 18 (FDG) INJECTION: 9.7000 | Freq: Once | INTRAVENOUS | Status: DC

## 2022-01-25 MED ORDER — FLORBETAPIR F 18 500-1900 MBQ/ML IV SOLN
9.7000 | Freq: Once | INTRAVENOUS | Status: AC
  Administered 2022-01-25: 9.7 via INTRAVENOUS

## 2022-01-29 ENCOUNTER — Telehealth: Payer: Self-pay | Admitting: Neurology

## 2022-01-29 NOTE — Telephone Encounter (Signed)
Jerry Foster has mild cognitive impairment.  The amyloid PET scan was negative so he does not have early AD.    Etiology less clear.   MRI r/o vascular dementia but he has mild atrophy.   Neurocognitive testing with neuropsychology was normal.  Home sleep study showed no significant OSA.    Perhaps depression could be playing some role.    I called but could not leave a message (mailbox full).   Will try to reach again later

## 2022-01-29 NOTE — Telephone Encounter (Signed)
I spoke with Dr. Golden Circle to go over the PET scan.  His mild cognitive impairment has been mostly stable.  We discussed the possibility of depression.  He feels his mood has done fairly well.  He is going to start seeing a therapist.  He does not have apathy and stays quite active socially and physically.

## 2022-02-01 ENCOUNTER — Ambulatory Visit (INDEPENDENT_AMBULATORY_CARE_PROVIDER_SITE_OTHER): Payer: Medicare Other

## 2022-02-01 DIAGNOSIS — Z950 Presence of cardiac pacemaker: Secondary | ICD-10-CM | POA: Diagnosis not present

## 2022-02-01 DIAGNOSIS — I442 Atrioventricular block, complete: Secondary | ICD-10-CM | POA: Diagnosis not present

## 2022-02-02 LAB — CUP PACEART REMOTE DEVICE CHECK
Battery Remaining Longevity: 104 mo
Battery Remaining Percentage: 80 %
Battery Voltage: 3.01 V
Brady Statistic AP VP Percent: 1 %
Brady Statistic AP VS Percent: 14 %
Brady Statistic AS VP Percent: 1 %
Brady Statistic AS VS Percent: 85 %
Brady Statistic RA Percent Paced: 14 %
Brady Statistic RV Percent Paced: 1 %
Date Time Interrogation Session: 20231110020013
Implantable Lead Connection Status: 753985
Implantable Lead Connection Status: 753985
Implantable Lead Implant Date: 20210506
Implantable Lead Implant Date: 20210506
Implantable Lead Location: 753859
Implantable Lead Location: 753860
Implantable Pulse Generator Implant Date: 20210506
Lead Channel Impedance Value: 430 Ohm
Lead Channel Impedance Value: 440 Ohm
Lead Channel Pacing Threshold Amplitude: 0.625 V
Lead Channel Pacing Threshold Amplitude: 1.375 V
Lead Channel Pacing Threshold Pulse Width: 0.4 ms
Lead Channel Pacing Threshold Pulse Width: 0.4 ms
Lead Channel Sensing Intrinsic Amplitude: 1.6 mV
Lead Channel Sensing Intrinsic Amplitude: 4.6 mV
Lead Channel Setting Pacing Amplitude: 1.625
Lead Channel Setting Pacing Amplitude: 1.625
Lead Channel Setting Pacing Pulse Width: 0.4 ms
Lead Channel Setting Sensing Sensitivity: 0.7 mV
Pulse Gen Model: 2272
Pulse Gen Serial Number: 3825759

## 2022-02-05 ENCOUNTER — Telehealth: Payer: Self-pay | Admitting: Neurology

## 2022-02-05 DIAGNOSIS — R413 Other amnesia: Secondary | ICD-10-CM

## 2022-02-05 NOTE — Telephone Encounter (Signed)
I spoke to Dr. Golden Circle about the addendum for the PET scan.  Upon further review, radiology felt the result was more equivocal.  Usually this test is clearly positive or negative.  I discussed this with him furhter.  I would like to check the pTau-181 blood test.  This looks at a different protein dysregulated and Alzheimer's.  If this is negative, then I feel more confident that the PET scan was negative.  However, if the tau test is positive we may need to reconsider rechecking the PET scan in the future or doing a CSF analysis.

## 2022-02-06 ENCOUNTER — Other Ambulatory Visit: Payer: Self-pay | Admitting: Neurology

## 2022-02-06 DIAGNOSIS — R413 Other amnesia: Secondary | ICD-10-CM

## 2022-02-06 NOTE — Progress Notes (Signed)
As PET scan is equivocal will order ATN profile from [facility] to help sort out possibility of AD.

## 2022-02-06 NOTE — Progress Notes (Signed)
Remote pacemaker transmission.   

## 2022-02-18 ENCOUNTER — Other Ambulatory Visit (INDEPENDENT_AMBULATORY_CARE_PROVIDER_SITE_OTHER): Payer: Self-pay

## 2022-02-18 DIAGNOSIS — Z0289 Encounter for other administrative examinations: Secondary | ICD-10-CM

## 2022-02-18 DIAGNOSIS — R413 Other amnesia: Secondary | ICD-10-CM

## 2022-02-21 LAB — ATN PROFILE
A -- Beta-amyloid 42/40 Ratio: 0.098 — ABNORMAL LOW (ref >0.102)
Beta-amyloid 40: 181.53 pg/mL
Beta-amyloid 42: 17.75 pg/mL
N -- NfL, Plasma: 2.44 pg/mL (ref 0.00–7.64)
T -- p-tau181: 1.06 pg/mL — ABNORMAL HIGH (ref 0.00–0.97)

## 2022-02-21 LAB — P-TAU181

## 2022-02-22 ENCOUNTER — Telehealth: Payer: Self-pay | Admitting: Neurology

## 2022-02-22 ENCOUNTER — Encounter: Payer: Self-pay | Admitting: Neurology

## 2022-02-22 ENCOUNTER — Other Ambulatory Visit: Payer: Self-pay | Admitting: Neurology

## 2022-02-22 MED ORDER — DONEPEZIL HCL 5 MG PO TABS: 5.0000 mg | ORAL_TABLET | Freq: Every day | ORAL | 0 refills | Status: DC

## 2022-02-22 MED ORDER — DONEPEZIL HCL 5 MG PO TABS: 5.0000 mg | ORAL_TABLET | Freq: Every day | ORAL | 5 refills | Status: DC

## 2022-02-22 NOTE — Telephone Encounter (Signed)
I spoke to Dr. Golden Circle about the ATN Alzheimer biomarkers.  The amyloid beta 42/40 ratio was reduced and the pTau181 was elevated.  This pattern is highly suggestive of Alzheimer's disease pathology.  Complicating the interpretation is that his amyloid PET scan was initially read as negative and then addended to equivocal..  He is potentially interested in [place].  We had previously discussed the risks and benefits.  There was modest benefit clinically but also risk of amyloid related edema and hemorrhage.  Additionally there would need to be monitoring of 3 additional MRIs during the first 6 or 7 months of therapy.  For now, I am going to start Donepezil 5 mg and we can increase that dose if well-tolerated.  We will set up a visit in January to further discuss.

## 2022-04-05 ENCOUNTER — Other Ambulatory Visit: Payer: Self-pay | Admitting: Cardiovascular Disease

## 2022-04-16 ENCOUNTER — Other Ambulatory Visit: Payer: Self-pay | Admitting: *Deleted

## 2022-04-16 ENCOUNTER — Telehealth: Payer: Self-pay | Admitting: *Deleted

## 2022-04-16 MED ORDER — APIXABAN 5 MG PO TABS: ORAL_TABLET | ORAL | 0 refills | Status: DC

## 2022-04-16 NOTE — Telephone Encounter (Signed)
S/w pt sent in Eliquis and scheduled appt with SW next week.

## 2022-04-19 ENCOUNTER — Other Ambulatory Visit: Payer: Self-pay | Admitting: Cardiovascular Disease

## 2022-04-23 NOTE — Progress Notes (Unsigned)
Cardiology Office Note:    Date:  04/24/2022   ID:  Jerry Foster, DOB December 28, 1950, MRN [id number]  PCP:  Jefm Petty, [place] [facility] Cardiologist:  Sherren Mocha, MD Electrophysiologist:  [name], MD     Referring MD: Jefm Petty, MD   Patient Profile: Aortic stenosis S/p min inv bioprosthetic AVR 06/2019 Post op c/b AFib and high grade HB >> fall with lac to L eyebrow, C6 avulsion fx >> pacemaker Limited TTE 12/08/19: EF 60-65, no RWMA, moderate LVH, GR 1 DD, normal RVSF, normally functioning AVR with mean gradient 15 mmHg Coronary artery disease  Mild, non-obs dz by cath 06/08/2019 Persistent atrial fibrillation  Post op >> recurrent after DC of Amiodarone   Isthmus dependent AFlutter S/p AFlutter ablation and PVI ablation for AFib in 02/2020 ([place]) Mobitz II AV block S/p Pacemaker  Hyperlipidemia RBBB Pericarditis (11/2019) Carotid US 07/19/19: Bilateral ICA 1-39 Aortic atherosclerosis  Alzheimer's Dementia      History of Present Illness:   Jerry Foster is a 72 y.o. male with the above problem list.  He was last seen by Dr. Burt Knack 02/27/2021. He was last seen in the AFib clinic 11/06/21. He returns for f/u on AS, AFib. He is here with his friend, Berneta Sages. [name] notes that he has been dx with Alzheimer's dementia. He has not had chest pain, shortness of breath, syncope, orthopnea, leg edema.      Reviewed and updated this encounter:   Tobacco  Allergies  Meds  Problems  Med Hx  Surg Hx  Fam Hx     ROS  Labs/Other Test Reviewed:   Recent Labs: 10/29/2021: ALT 29; BUN 16; Creatinine, Ser 0.88; Hemoglobin 15.9; Platelets 124; Potassium 3.2; Sodium 137   Recent Lipid Panel No results for input(s): "CHOL", "TRIG", "HDL", "VLDL", "LDLCALC", "LDLDIRECT" in the last 8760 hours.   Risk Assessment/Calculations/Metrics:    CHA2DS2-VASc Score = 2   This indicates a 2.2% annual risk of stroke. The patient's score is based upon: CHF History:  0 HTN History: 0 Diabetes History: 0 Stroke History: 0 Vascular Disease History: 1 Age Score: 1 Gender Score: 0    STOP-Bang Score:  5          Physical Exam:   VS:  BP 112/80 (BP Location: Left Arm, Patient Position: Sitting, Cuff Size: Normal)   Pulse 75   Ht '5\' 9"'$  (1.753 m)   Wt 169 lb (76.7 kg)   SpO2 99%   BMI 24.96 kg/m    Wt Readings from Last 3 Encounters:  04/24/22 169 lb (76.7 kg)  01/09/22 164 lb 8 oz (74.6 kg)  11/06/21 159 lb 12.8 oz (72.5 kg)    Constitutional:      Appearance: Healthy appearance. Not in distress.  Neck:     Vascular: JVD normal.  Pulmonary:     Breath sounds: Normal breath sounds. No wheezing. No rales.  Cardiovascular:     Normal rate. Regular rhythm. Normal S1. Normal S2.      Murmurs: There is a grade 2/6 systolic murmur at the URSB.  Edema:    Peripheral edema absent.  Abdominal:     Palpations: Abdomen is soft.          ASSESSMENT & PLAN:   Severe aortic stenosis S/p bioprosthetic AVR in 2021. Normally functioning AVR by last echocardiogram in 2021. Clinically, he is doing well w/o chest pain, shortness of breath.  Continue SBE prophylaxis.  Obtain follow-up echocardiogram prior to next visit.  Persistent atrial fibrillation ([place]) Status post PVI ablation in 2021.  Recent device interrogation demonstrated approximately 4% A-fib burden.  Continue Eliquis 5 mg twice daily.  Recent hemoglobin normal.  Recent creatinine less than 1.5.  Pure hypercholesterolemia He knows that he stop taking pravastatin due to his recent diagnosis of Alzheimer's dementia.  We discussed the rationale for taking statin therapy as it relates to coronary artery disease, aortic atherosclerosis.  He agrees to resume this.  Lipids are managed by primary care.  Sleep disorder breathing He is been told that he snores and has been told that he stops breathing when he is sleeping. STOP-Bang Score:  5     Arrange [place] Home Sleep Study. He is hesitant to have to  use CPAP. He may be a candidate for Inspire.               Dispo:  Return in about 6 months (around 10/23/2022) for Routine Follow Up, w/ Dr. Burt Knack.   Signed, [name], PA-C  04/24/2022 9:48 AM    [facility] [address], [place], [place]  [address] Phone: [phone]; Fax: [phone]

## 2022-04-24 ENCOUNTER — Encounter: Payer: Self-pay | Admitting: Physician Assistant

## 2022-04-24 ENCOUNTER — Ambulatory Visit: Payer: Medicare Other | Attending: Physician Assistant | Admitting: Physician Assistant

## 2022-04-24 VITALS — BP 112/80 | HR 75 | Ht 69.0 in | Wt 169.0 lb

## 2022-04-24 DIAGNOSIS — I350 Nonrheumatic aortic (valve) stenosis: Secondary | ICD-10-CM | POA: Diagnosis not present

## 2022-04-24 DIAGNOSIS — G4730 Sleep apnea, unspecified: Secondary | ICD-10-CM | POA: Diagnosis not present

## 2022-04-24 DIAGNOSIS — E7800 Pure hypercholesterolemia, unspecified: Secondary | ICD-10-CM | POA: Diagnosis not present

## 2022-04-24 DIAGNOSIS — I4819 Other persistent atrial fibrillation: Secondary | ICD-10-CM

## 2022-04-24 NOTE — Assessment & Plan Note (Signed)
He is been told that he snores and has been told that he stops breathing when he is sleeping. STOP-Bang Score:  5     Arrange Jerry Foster Home Sleep Study. He is hesitant to have to use CPAP. He may be a candidate for Inspire.

## 2022-04-24 NOTE — Patient Instructions (Addendum)
Medication Instructions:  Your physician recommends that you continue on your current medications as directed. Please refer to the Current Medication list given to you today.  *If you need a refill on your cardiac medications before your next appointment, please call your pharmacy*   Lab Work: None ordered   If you have labs (blood work) drawn today and your tests are completely normal, you will receive your results only by: [place] (if you have MyChart) OR A paper copy in the mail If you have any lab test that is abnormal or we need to change your treatment, we will call you to review the results.   Testing/Procedures: Your physician has requested that you have an echocardiogram IN 6 MONTHS, A FEW DAYS BEFORE YOU SEE DR. Burt Knack. Echocardiography is a painless test that uses sound waves to create images of your heart. It provides your doctor with information about the size and shape of your heart and how well your heart's chambers and valves are working. This procedure takes approximately one hour. There are no restrictions for this procedure. Please do NOT wear cologne, perfume, aftershave, or lotions (deodorant is allowed). Please arrive 15 minutes prior to your appointment time.   Your physician has recommended that you have a sleep study. DO NOT OPEN THE BOX UNTIL YOU RECEIVE A CALL WITH A CODE FROM OUR OFFICE.  THIS WILL TAKE PLACE ONCE WE RECEIVE AUTHORIZATION FROM YOUR INSURANCE.  This test records several body functions during sleep, including: brain activity, eye movement, oxygen and carbon dioxide blood levels, heart rate and rhythm, breathing rate and rhythm, the flow of air through your mouth and nose, snoring, body muscle movements, and chest and belly movement.    Follow-Up: At [facility], you and your health needs are our priority.  As part of our continuing mission to provide you with exceptional heart care, we have created designated Provider Care Teams.   These Care Teams include your primary Cardiologist (physician) and Advanced Practice Providers (APPs -  Physician Assistants and Nurse Practitioners) who all work together to provide you with the care you need, when you need it.  We recommend signing up for the patient portal called "MyChart".  Sign up information is provided on this After Visit Summary.  MyChart is used to connect with patients for Virtual Visits (Telemedicine).  Patients are able to view lab/test results, encounter notes, upcoming appointments, etc.  Non-urgent messages can be sent to your provider as well.   To learn more about what you can do with MyChart, go to [email or web address].    Your next appointment:   6 month(s)  Provider:   Sherren Mocha, MD     Other Instructions

## 2022-04-24 NOTE — Assessment & Plan Note (Signed)
Status post PVI ablation in 2021.  Recent device interrogation demonstrated approximately 4% A-fib burden.  Continue Eliquis 5 mg twice daily.  Recent hemoglobin normal.  Recent creatinine less than 1.5.

## 2022-04-24 NOTE — Assessment & Plan Note (Signed)
He knows that he stop taking pravastatin due to his recent diagnosis of Alzheimer's dementia.  We discussed the rationale for taking statin therapy as it relates to coronary artery disease, aortic atherosclerosis.  He agrees to resume this.  Lipids are managed by primary care.

## 2022-04-24 NOTE — Assessment & Plan Note (Signed)
S/p bioprosthetic AVR in 2021. Normally functioning AVR by last echocardiogram in 2021. Clinically, he is doing well w/o chest pain, shortness of breath.  Continue SBE prophylaxis.  Obtain follow-up echocardiogram prior to next visit.

## 2022-04-25 ENCOUNTER — Telehealth: Payer: Self-pay

## 2022-04-25 NOTE — Telephone Encounter (Signed)
Pt was seen in the office today by [name], PA who ordered the patient an Itamar sleep study. Patient is agreeable to signed waiver and not open the box until he has been called with the PIN#.

## 2022-05-02 NOTE — Telephone Encounter (Signed)
Prior Authorization for ITAMAR sent to [employer] via web portal. Tracking Number . do not require Pre-Authorization by [place] 

## 2022-05-03 ENCOUNTER — Ambulatory Visit: Payer: Medicare Other

## 2022-05-03 DIAGNOSIS — I442 Atrioventricular block, complete: Secondary | ICD-10-CM

## 2022-05-03 LAB — CUP PACEART REMOTE DEVICE CHECK
Battery Remaining Longevity: 101 mo
Battery Remaining Percentage: 78 %
Battery Voltage: 3.01 V
Brady Statistic AP VP Percent: 1 %
Brady Statistic AP VS Percent: 15 %
Brady Statistic AS VP Percent: 1 %
Brady Statistic AS VS Percent: 85 %
Brady Statistic RA Percent Paced: 14 %
Brady Statistic RV Percent Paced: 1 %
Date Time Interrogation Session: 20240209020017
Implantable Lead Connection Status: 753985
Implantable Lead Connection Status: 753985
Implantable Lead Implant Date: 20210506
Implantable Lead Implant Date: 20210506
Implantable Lead Location: 753859
Implantable Lead Location: 753860
Implantable Pulse Generator Implant Date: 20210506
Lead Channel Impedance Value: 410 Ohm
Lead Channel Impedance Value: 450 Ohm
Lead Channel Pacing Threshold Amplitude: 0.5 V
Lead Channel Pacing Threshold Amplitude: 1.125 V
Lead Channel Pacing Threshold Pulse Width: 0.4 ms
Lead Channel Pacing Threshold Pulse Width: 0.4 ms
Lead Channel Sensing Intrinsic Amplitude: 3.3 mV
Lead Channel Sensing Intrinsic Amplitude: 4.5 mV
Lead Channel Setting Pacing Amplitude: 1.375
Lead Channel Setting Pacing Amplitude: 1.5 V
Lead Channel Setting Pacing Pulse Width: 0.4 ms
Lead Channel Setting Sensing Sensitivity: 0.7 mV
Pulse Gen Model: 2272
Pulse Gen Serial Number: 3825759

## 2022-05-03 NOTE — Telephone Encounter (Signed)
Pt has been given PIN# [id number]. Pt will do study between tonight and early next week.   Called and made the patient aware that he may proceed with the [facility] Sleep Study. PIN # provided to the patient. Patient made aware that he will be contacted after the test has been read with the results and any recommendations. Patient verbalized understanding and thanked me for the call.

## 2022-05-08 ENCOUNTER — Encounter (INDEPENDENT_AMBULATORY_CARE_PROVIDER_SITE_OTHER): Payer: Medicare Other | Admitting: Cardiology

## 2022-05-08 DIAGNOSIS — G4719 Other hypersomnia: Secondary | ICD-10-CM | POA: Diagnosis not present

## 2022-05-10 ENCOUNTER — Other Ambulatory Visit: Payer: Self-pay | Admitting: Cardiovascular Disease

## 2022-05-10 NOTE — Telephone Encounter (Signed)
Prescription refill request for Eliquis received.  Indication: afib  Last office visit:[place], 04/24/2022 Scr: 10/29/2021, 0.88 Age: 72 yo  Weight: 76.7 kg   Refill sent.

## 2022-05-11 ENCOUNTER — Ambulatory Visit: Payer: Medicare Other | Attending: Physician Assistant

## 2022-05-11 ENCOUNTER — Encounter: Payer: Self-pay | Admitting: Physician Assistant

## 2022-05-11 DIAGNOSIS — I350 Nonrheumatic aortic (valve) stenosis: Secondary | ICD-10-CM

## 2022-05-11 DIAGNOSIS — I4819 Other persistent atrial fibrillation: Secondary | ICD-10-CM

## 2022-05-11 DIAGNOSIS — G4730 Sleep apnea, unspecified: Secondary | ICD-10-CM

## 2022-05-11 NOTE — Procedures (Signed)
   SLEEP STUDY REPORT Patient Information Study Date: 05/08/2022 Patient Name: Jerry Foster Patient ID: [id number] Birth Date: May 31, 1950 Age: 72 Gender: Male BMI: 25.1 (W=170 lb, H=5' 9'') Referring Physician: [name], PA  TEST DESCRIPTION: Home sleep apnea testing was completed using the WatchPat, a Type 1 device, utilizing peripheral arterial tonometry (PAT), chest movement, actigraphy, pulse oximetry, pulse rate, body position and snore. AHI was calculated with apnea and hypopnea using valid sleep time as the denominator. RDI includes apneas, hypopneas, and RERAs. The data acquired and the scoring of sleep and all associated events were performed in accordance with the recommended standards and specifications as outlined in the AASM Manual for the Scoring of Sleep and Associated Events 2.2.0 (2015).   FINDINGS: 1.  No evidence of Obstructive Sleep Apnea with AHI 0.8/hr.  2.  No Central Sleep Apnea. 3.  Oxygen desaturations as low as 85%. 4.  Minimal snoring was present. O2 sats were < 88% for 0.1 minutes. 5.  Total sleep time was 7 hrs and 9 min. 6.  15.5% of total sleep time was spent in REM sleep.  7.  Normal sleep onset latency at 21 min.  8.  Prolonged REM sleep onset latency at 293 min.  9.  Total awakenings were 4.  10.  Possible atrial arrhythmias with a duration of 43 seconds.  This is not diagnostic of atrial fibrillation and if clinically indicated should consider further testing with outpatient event monitoring.   DIAGNOSIS:  Normal study with no significant sleep disordered breathing. Possible atrial arrhythmias.  RECOMMENDATIONS:   1. Normal study with no significant sleep disordered breathing.  2.  Healthy sleep recommendations include:  adequate nightly sleep (normal 7-9 hrs/night), avoidance of caffeine after noon and alcohol near bedtime, and maintaining a sleep environment that is cool, dark and quiet.  3.  Weight loss for overweight patients is  recommended.    4.  Snoring recommendations include:  weight loss where appropriate, side sleeping, and avoidance of alcohol before bed.  5.  Operation of motor vehicle or dangerous equipment must be avoided when feeling drowsy, excessively sleepy, or mentally fatigued.    6.  An ENT consultation which may be useful for specific causes of and possible treatment of bothersome snoring.   7. Weight loss may be of benefit in reducing the severity of snoring.   Signature: Fransico Him, MD; [facility]; [place], [place] Board of Sleep Medicine Electronically Signed: 05/11/2022

## 2022-05-15 ENCOUNTER — Telehealth: Payer: Self-pay | Admitting: *Deleted

## 2022-05-15 NOTE — Telephone Encounter (Signed)
-----   Message from Jerry Foster, [place] sent at 05/13/2022  8:44 AM EST -----  ----- Message ----- From: Sueanne Margarita, MD Sent: 05/11/2022  10:11 AM EST To: [facility] Sleep Studies  Please let patient know that sleep study showed no significant sleep apnea.

## 2022-05-15 NOTE — Telephone Encounter (Signed)
The patient has been notified of the result and verbalized understanding.  All questions (if any) were answered. Jerry Foster, [place] 05/15/2022 6:12 PM     Pt is agreeable to normal results.

## 2022-05-21 ENCOUNTER — Telehealth: Payer: Self-pay | Admitting: Neurology

## 2022-05-21 NOTE — Progress Notes (Signed)
Remote pacemaker transmission.   

## 2022-05-21 NOTE — Telephone Encounter (Signed)
LVM returning pt call. Asked him to call back.  Per note from Dr. Felecia Shelling 02/22/22:  "The amyloid beta 42/40 ratio was reduced and the pTau181 protein was elevated.  This pattern is highly suggestive of Alzheimer's disease pathology. Complicating the interpretation is that his amyloid PET scan was initially read as negative and then addended to equivocal. The medication we spoke about is lecanemab (Also known as [place]).  We had previously discussed the risks and benefits.  There was modest benefit clinically but also risk of amyloid related edema and hemorrhage.  Additionally there would need to be monitoring of 3 additional MRIs during the first 6 or 7 months of therapy. For now, I am going to start Donepezil 5 mg and we can increase that dose if well-tolerated.  We will set up a visit in January to further discuss."  I don't see where a visit was ever scheduled for him to come in to see Dr. Felecia Shelling in January. Please offer appt on hold today at 3:30pm with Dr. Felecia Shelling if he can come in then. That way they can discuss next steps.

## 2022-05-21 NOTE — Telephone Encounter (Signed)
Pt is calling stated he needs to talk to nurse about the new medication he was waiting to come in. Pt is requesting a call back from nurse.

## 2022-05-22 ENCOUNTER — Ambulatory Visit: Payer: Medicare Other | Admitting: Physician Assistant

## 2022-05-22 NOTE — Telephone Encounter (Signed)
Can you please see if pt on 06/12/22 at 245pm on Dr. Garth Bigness schedule can move to 230pm? Then offer appt at 3pm for this pt to see Dr. Felecia Shelling to further discuss options. We can then also add him to the wait list

## 2022-05-27 ENCOUNTER — Other Ambulatory Visit: Payer: Self-pay | Admitting: Cardiovascular Disease

## 2022-05-27 NOTE — Telephone Encounter (Signed)
Called pt and left a  VM message to please call office to schedule appointment.

## 2022-05-27 NOTE — Telephone Encounter (Signed)
Please follow to see if they can take appt

## 2022-05-28 NOTE — Telephone Encounter (Signed)
Called pt and LVM again for him to call back and schedule this appt.

## 2022-06-11 ENCOUNTER — Other Ambulatory Visit: Payer: Self-pay | Admitting: Cardiovascular Disease

## 2022-06-11 DIAGNOSIS — I10 Essential (primary) hypertension: Secondary | ICD-10-CM

## 2022-06-12 ENCOUNTER — Encounter: Payer: Self-pay | Admitting: Neurology

## 2022-06-12 ENCOUNTER — Ambulatory Visit: Payer: Medicare Other | Admitting: Neurology

## 2022-06-12 VITALS — BP 113/77 | HR 70 | Ht 69.0 in | Wt 168.0 lb

## 2022-06-12 DIAGNOSIS — I4819 Other persistent atrial fibrillation: Secondary | ICD-10-CM | POA: Diagnosis not present

## 2022-06-12 DIAGNOSIS — G3184 Mild cognitive impairment, so stated: Secondary | ICD-10-CM

## 2022-06-12 DIAGNOSIS — R413 Other amnesia: Secondary | ICD-10-CM

## 2022-06-12 MED ORDER — DONEPEZIL HCL 10 MG PO TABS: 10.0000 mg | ORAL_TABLET | Freq: Every day | ORAL | 4 refills | Status: DC

## 2022-06-12 NOTE — Progress Notes (Signed)
[facility]  PATIENT: Jerry Foster DOB: 10-Dec-1950  REFERRING DOCTOR OR PCP: Jefm Petty, MD SOURCE: Patient, notes from primary care, laboratory reports.  _________________________________   HISTORICAL  CHIEF COMPLAINT:  Chief Complaint  Patient presents with   Room 10    Pt is here with Jeneen Rinks. Pt states that memory has been steady. Pt states that life is good, nothing new to report.     HISTORY OF PRESENT ILLNESS:  [place], at [facility] for neurologic consultation regarding his memory loss and episodes of dizziness.  UPDATE 06/12/2022: His amyloid 42/40 ratio was mildly reduced and pTau181 was mildly increased on 02/18/2022.    However, the amyloid PET scan 01/25/2022 was equivocal.      He continues to note difficulty with his memory.    He has gotten lost near his home.  He now needs to use GPS more.   He is still reading and has memory of some of what he reads but not everything.   He spent a few hours at a friends new home but could not remember anything about the visit.    He needs to write things down more.  He has more trouble getting names out sometimes.   He always does better with hints.      He scored 26/30 on the [facility] cognitive assessment today which is consistent with mild cognitive impairment.  He lost 3 points for delayed recall.  However, about 15 to 20 minutes later when I asked him about the 5 words, he got 3 correct without a prompt and an additional 1 with a category prompt..  Of note, he is educated (retired Pharmacist, community) so a score of 26 is certainly abnormal.       Earlier this year, because he had snoring and mild excessive daytime sleepiness we ordered a home sleep study.  It did not show significant sleep apnea.  He has not noted more episodes of dizziness.  At MR angiogram was fine.  He had a AVR in 07/2019 followed by cardiac arrest and pacemaker placement. The surgery was performed with cardioplegia pump  (time 159 minutes).  He developed AFib and had ablation January 2022 with good results.      His cardiologist is Dr. Burt Knack.      06/12/2022    3:10 PM 01/09/2022    9:48 AM 09/18/2021    9:28 AM 03/20/2021    8:35 AM  Montreal Cognitive Assessment   Visuospatial/ Executive (0/5) 5 5 5 4   Naming (0/3) 3 3 3 3   Attention: Read list of digits (0/2) 2 2 2 2   Attention: Read list of letters (0/1) 1 1 1 1   Attention: Serial 7 subtraction starting at 100 (0/3) 3 2 3 3   Language: Repeat phrase (0/2) 2 2 2 2   Language : Fluency (0/1) 1 1 0 1  Abstraction (0/2) 2 2 2 2   Delayed Recall (0/5) 5 2 0 3  Orientation (0/6) 6 6 6 6   Total 30 26 24 27   Adjusted Score (based on education) 61  24      His mother and several aunts and maternal GM had Alzheimer.   His mother started having issues at age 1 while others were in 66's and 21's Is father lived to late 13's with no cognitive issues.     Data: I personally reviewed the CT scan from 07/28/2019.  It shows mild generalized cortical atrophy that is typical for age.  There do not  appear to be any large vessel strokes or acute findings in the brain.  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  MRI of the brain 11/05/2021 shows mild generalized cortical atrophy, possibly a little more pronounced in the medial temporal lobes.  It is unchanged compared to the CT scan from 07/28/2019 and 04/12/2021.  TSH, B12, Vit D and HIV were all normal or negative between 06/2020 and 11/2020.       REVIEW OF SYSTEMS: Constitutional: No fevers, chills, sweats, or change in appetite Eyes: No visual changes, double vision, eye pain Ear, nose and throat: No hearing loss, ear pain, nasal congestion, sore throat Cardiovascular: No chest pain, palpitations Respiratory:  No shortness of breath at rest or with exertion.   No wheezes GastrointestinaI: No nausea, vomiting, diarrhea, abdominal pain, fecal  incontinence Genitourinary:  No dysuria, urinary retention or frequency.  No nocturia. Musculoskeletal:  No neck pain, back pain Integumentary: No rash, pruritus, skin lesions Neurological: as above Psychiatric: No depression at this time.  No anxiety Endocrine: No palpitations, diaphoresis, change in appetite, change in weigh or increased thirst Hematologic/Lymphatic:  No anemia, purpura, petechiae. Allergic/Immunologic: No itchy/runny eyes, nasal congestion, recent allergic reactions, rashes  ALLERGIES: Allergies  Allergen Reactions   Lamisil [Terbinafine]     Elevated liver enzymes (LFTs)    HOME MEDICATIONS:  Current Outpatient Medications:    acetaminophen (TYLENOL) 500 MG tablet, Take 1,000 mg by mouth every 8 (eight) hours as needed for moderate pain or headache., Disp: , Rfl:    amLODipine (NORVASC) 10 MG tablet, TAKE 1 TABLET(10 MG) BY MOUTH DAILY, Disp: 90 tablet, Rfl: 3   amoxicillin (AMOXIL) 500 MG capsule, as directed. For dental appts, Disp: , Rfl:    apixaban (ELIQUIS) 5 MG TABS tablet, TAKE 1 TABLET(5 MG) BY MOUTH TWICE DAILY, Disp: 60 tablet, Rfl: 5   Calcium Citrate-Vitamin D (CITRACAL + D PO), Take 2 tablets by mouth in the morning and at bedtime., Disp: , Rfl:    chlorthalidone (HYGROTON) 25 MG tablet, Take 1/2 tablet by mouth daily., Disp: 45 tablet, Rfl: 3   Cholecalciferol (DIALYVITE VITAMIN D 5000) 125 MCG (5000 UT) capsule, Take 5,000 Units by mouth daily., Disp: , Rfl:    Omega-3 1000 MG CAPS, Take by mouth., Disp: , Rfl:    potassium chloride SA (KLOR-CON M) 20 MEQ tablet, Take 1 tablet (20 mEq total) by mouth 2 (two) times daily., Disp: 30 tablet, Rfl: 5   pravastatin (PRAVACHOL) 40 MG tablet, Take 40 mg by mouth daily., Disp: , Rfl:    sertraline (ZOLOFT) 50 MG tablet, Take 1 tablet (50 mg total) by mouth daily., Disp: 30 tablet, Rfl: 11   donepezil (ARICEPT) 10 MG tablet, Take 1 tablet (10 mg total) by mouth at bedtime., Disp: 90 tablet, Rfl: 4  PAST  MEDICAL HISTORY: Past Medical History:  Diagnosis Date   Aortic stenosis    Benign prostatic hyperplasia    Bronchitis    Cataract    bilateral    CHF (congestive heart failure) (HCC)    Colon polyps    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   Heart murmur    History of fracture of vertebral column    Hyperlipidemia    Kidney stone    Nonrheumatic aortic valve stenosis    Osteoporosis    Osteoporosis    Pure hypercholesterolemia    RBBB    S/P minimally-invasive aortic valve replacement with  bioprosthetic valve 07/23/2019   21 mm Edwards Inspiris Resilia stented bovine pericardial tissue valve via right mini thoracotomy approach   Sensorineural hearing loss of both ears    Snoring 03/20/2021   Itamar Sleep Study Feb 2024: normal   Thrombosed hemorrhoids    hsitory of   Tubular adenoma of colon    Vitamin D deficiency     PAST SURGICAL HISTORY: Past Surgical History:  Procedure Laterality Date   AORTIC VALVE REPLACEMENT N/A 07/23/2019   Procedure: MINIMALLY INVASIVE AORTIC VALVE REPLACEMENT (AVR) using Margaretha Sheffield Resilia 21 MM Aortic Valve.;  Surgeon: Rexene Alberts, MD;  Location: [facility];  Service: Open Heart Surgery;  Laterality: N/A;   ATRIAL FIBRILLATION ABLATION N/A 03/15/2020   Procedure: ATRIAL FIBRILLATION ABLATION;  Surgeon: [name], MD;  Location: [facility];  Service: Cardiovascular;  Laterality: N/A;   CARDIAC CATHETERIZATION     COLON SURGERY  2006   1 foot ascending colon removed    COLONOSCOPY     HERNIA REPAIR     PACEMAKER IMPLANT N/A 07/29/2019   Procedure: PACEMAKER IMPLANT;  Surgeon: [name], MD;  Location: [facility] [facility];  Service: Cardiovascular;  Laterality: N/A;   POLYPECTOMY     RIGHT HEART CATH AND CORONARY ANGIOGRAPHY N/A 06/08/2019   Procedure: RIGHT HEART CATH AND CORONARY ANGIOGRAPHY;  Surgeon: Sherren Mocha, MD;  Location: [place] [facility];  Service: Cardiovascular;  Laterality: N/A;   TEE WITHOUT  CARDIOVERSION N/A 07/23/2019   Procedure: TRANSESOPHAGEAL ECHOCARDIOGRAM (TEE);  Surgeon: Rexene Alberts, MD;  Location: [facility];  Service: Open Heart Surgery;  Laterality: N/A;   TESTICLE SURGERY      FAMILY HISTORY: Family History  Problem Relation Age of Onset   Heart disease Maternal Uncle    Hypertension Brother    Colon cancer Neg Hx    Colon polyps Neg Hx    Esophageal cancer Neg Hx    Rectal cancer Neg Hx    Stomach cancer Neg Hx     SOCIAL HISTORY:  Social History   Socioeconomic History   Marital status: Single    Spouse name: Not on file   Number of children: Not on file   Years of education: Not on file   Highest education level: Professional school degree (e.g., MD, DDS, DVM, JD)  Occupational History   Occupation: Retired Pharmacist, community  Tobacco Use   Smoking status: Never   Smokeless tobacco: Never   Tobacco comments:    Never smoke 11/06/21  Vaping Use   Vaping Use: Never used  Substance and Sexual Activity   Alcohol use: Yes    Alcohol/week: 6.0 - 8.0 standard drinks of alcohol    Types: 6 - 8 Glasses of wine per week    Comment: 2 glass 3-4 times a week 05/08/21   Drug use: No   Sexual activity: Not on file  Other Topics Concern   Not on file  Social History Narrative   Lives alone, currently has a friend living with him til around April   Left handed   Caffeine: 5 cups of coffee in the AM   Social Determinants of Health   Financial Resource Strain: Not on file  Food Insecurity: Not on file  Transportation Needs: Not on file  Physical Activity: Not on file  Stress: Not on file  Social Connections: Not on file  Intimate Partner Violence: Not on file     PHYSICAL EXAM  Vitals:   06/12/22 1507  BP: 113/77  Pulse: 70  Weight: 168 lb (76.2 kg)  Height: 5\' 9"  (1.753 m)    Body mass index is 24.81 kg/m.   General: The patient is well-developed and well-nourished and in no acute distress  HEENT:  Head is [place]/AT.  Sclera are anicteric.     Neck: Good range of motion.  Skin: Extremities are without rash.  He has mild edema at the ankles.  Neurologic Exam  Mental status: The patient is alert and oriented x 3 at the time of the examination.  Details of the [facility] cognitive assessment are above.  Scored 30/30 today  Cranial nerves: Extraocular movements are full.  Facial strength and sensation was normal.  The tongue is midline, and the patient has symmetric elevation of the soft palate. No obvious hearing deficits are noted.  Motor:  Muscle bulk is normal.   Tone is normal. Strength is  5 / 5 in all 4 extremities.   Sensory: Normal sensation to touch and temperature in the arms.  Probably normal sensation in the legs (mild asymmetry medially in the legs but symmetric elsewhere in the legs  Coordination: Cerebellar testing reveals good finger-nose-finger and heel-to-shin bilaterally.  Gait and station: Station is normal.   Gait is normal. Tandem gait is normal for age. Romberg is negative.   Reflexes: Deep tendon reflexes are symmetric and normal bilaterally.   Plantar responses are flexor.    DIAGNOSTIC DATA (LABS, IMAGING, TESTING) - I reviewed patient records, labs, notes, testing and imaging myself where available.  Lab Results  Component Value Date   WBC 9.6 10/29/2021   HGB 15.9 10/29/2021   HCT 45.4 10/29/2021   MCV 88.2 10/29/2021   PLT 124 (L) 10/29/2021      Component Value Date/Time   NA 137 10/29/2021 1304   NA 139 08/15/2021 1455   K 3.2 (L) 10/29/2021 1304   CL 97 (L) 10/29/2021 1304   CO2 28 10/29/2021 1304   GLUCOSE 121 (H) 10/29/2021 1304   BUN 16 10/29/2021 1304   BUN 24 08/15/2021 1455   CREATININE 0.88 10/29/2021 1304   CALCIUM 10.2 10/29/2021 1304   PROT 7.5 10/29/2021 1304   PROT 6.7 09/07/2019 0931   ALBUMIN 4.4 10/29/2021 1304   ALBUMIN 4.0 09/07/2019 0931   AST 39 10/29/2021 1304   ALT 29 10/29/2021 1304   ALKPHOS 55 10/29/2021 1304   BILITOT 1.2 10/29/2021 1304   BILITOT  0.4 09/07/2019 0931   GFRNONAA >60 10/29/2021 1304   GFRAA 99 02/22/2020 1527   Lab Results  Component Value Date   HGBA1C 5.7 (H) 07/19/2019   No results found for: "VITAMINB12" Lab Results  Component Value Date   TSH 1.150 12/24/2019       ASSESSMENT AND PLAN  Memory loss  Persistent atrial fibrillation (HCC)  Mild cognitive impairment   He scored 30/30 on the Montreal cognitive assessment today (was 26/30 last visit).   Blood biomarkers were c/w early AD but PET was equivocal.   Will continue donepzi and increase dose to 10 mg Stay active and exercise as tolerated. 5.   He will return to see [place] in 4 months or sooner if there are new or worsening neurologic symptoms.   Faythe Heitzenrater A. Felecia Shelling, MD, [id number], 0000000 PM Certified in Neurology, Clinical Neurophysiology, Sleep Medicine and Neuroimaging  [facility] [address] [place], [place] [address] [phone]

## 2022-06-24 ENCOUNTER — Other Ambulatory Visit: Payer: Self-pay | Admitting: Cardiovascular Disease

## 2022-07-30 ENCOUNTER — Ambulatory Visit: Payer: Medicare Other | Admitting: Neurology

## 2022-08-02 ENCOUNTER — Ambulatory Visit (INDEPENDENT_AMBULATORY_CARE_PROVIDER_SITE_OTHER): Payer: Medicare Other

## 2022-08-02 DIAGNOSIS — I442 Atrioventricular block, complete: Secondary | ICD-10-CM | POA: Diagnosis not present

## 2022-08-02 LAB — CUP PACEART REMOTE DEVICE CHECK
Battery Remaining Longevity: 98 mo
Battery Remaining Percentage: 76 %
Battery Voltage: 3.01 V
Brady Statistic AP VP Percent: 1 %
Brady Statistic AP VS Percent: 15 %
Brady Statistic AS VP Percent: 1 %
Brady Statistic AS VS Percent: 85 %
Brady Statistic RA Percent Paced: 14 %
Brady Statistic RV Percent Paced: 1 %
Date Time Interrogation Session: 20240510020056
Implantable Lead Connection Status: 753985
Implantable Lead Connection Status: 753985
Implantable Lead Implant Date: 20210506
Implantable Lead Implant Date: 20210506
Implantable Lead Location: 753859
Implantable Lead Location: 753860
Implantable Pulse Generator Implant Date: 20210506
Lead Channel Impedance Value: 400 Ohm
Lead Channel Impedance Value: 450 Ohm
Lead Channel Pacing Threshold Amplitude: 0.5 V
Lead Channel Pacing Threshold Amplitude: 1 V
Lead Channel Pacing Threshold Pulse Width: 0.4 ms
Lead Channel Pacing Threshold Pulse Width: 0.4 ms
Lead Channel Sensing Intrinsic Amplitude: 3.4 mV
Lead Channel Sensing Intrinsic Amplitude: 4.5 mV
Lead Channel Setting Pacing Amplitude: 1.25 V
Lead Channel Setting Pacing Amplitude: 1.5 V
Lead Channel Setting Pacing Pulse Width: 0.4 ms
Lead Channel Setting Sensing Sensitivity: 0.7 mV
Pulse Gen Model: 2272
Pulse Gen Serial Number: 3825759

## 2022-08-20 NOTE — Progress Notes (Signed)
Remote pacemaker transmission.   

## 2022-09-06 ENCOUNTER — Emergency Department (HOSPITAL_COMMUNITY): Payer: Medicare Other

## 2022-09-06 ENCOUNTER — Encounter (HOSPITAL_COMMUNITY): Payer: Self-pay

## 2022-09-06 ENCOUNTER — Ambulatory Visit (HOSPITAL_COMMUNITY)
Admission: EM | Admit: 2022-09-06 | Discharge: 2022-09-06 | Disposition: A | Discharge status: Home / Self Care | Payer: Medicare Other

## 2022-09-06 ENCOUNTER — Other Ambulatory Visit: Payer: Self-pay

## 2022-09-06 ENCOUNTER — Emergency Department (HOSPITAL_COMMUNITY)
Admission: EM | Admit: 2022-09-06 | Discharge: 2022-09-06 | Disposition: A | Discharge status: Home / Self Care | Payer: Medicare Other | Attending: Emergency Medicine | Admitting: Emergency Medicine

## 2022-09-06 DIAGNOSIS — Z950 Presence of cardiac pacemaker: Secondary | ICD-10-CM | POA: Insufficient documentation

## 2022-09-06 DIAGNOSIS — G309 Alzheimer's disease, unspecified: Secondary | ICD-10-CM | POA: Insufficient documentation

## 2022-09-06 DIAGNOSIS — R410 Disorientation, unspecified: Secondary | ICD-10-CM | POA: Insufficient documentation

## 2022-09-06 DIAGNOSIS — Z7901 Long term (current) use of anticoagulants: Secondary | ICD-10-CM | POA: Insufficient documentation

## 2022-09-06 LAB — CBC WITH DIFFERENTIAL/PLATELET
Abs Immature Granulocytes: 0.03 10*3/uL (ref 0.00–0.07)
Basophils Absolute: 0 10*3/uL (ref 0.0–0.1)
Basophils Relative: 0 %
Eosinophils Absolute: 0.1 10*3/uL (ref 0.0–0.5)
Eosinophils Relative: 1 %
HCT: 49.8 % (ref 39.0–52.0)
Hemoglobin: 16.6 g/dL (ref 13.0–17.0)
Immature Granulocytes: 0 %
Lymphocytes Relative: 30 %
Lymphs Abs: 2.2 10*3/uL (ref 0.7–4.0)
MCH: 31.1 pg (ref 26.0–34.0)
MCHC: 33.3 g/dL (ref 30.0–36.0)
MCV: 93.4 fL (ref 80.0–100.0)
Monocytes Absolute: 0.5 10*3/uL (ref 0.1–1.0)
Monocytes Relative: 7 %
Neutro Abs: 4.5 10*3/uL (ref 1.7–7.7)
Neutrophils Relative %: 62 %
Platelets: 137 10*3/uL — ABNORMAL LOW (ref 150–400)
RBC: 5.33 MIL/uL (ref 4.22–5.81)
RDW: 13 % (ref 11.5–15.5)
WBC: 7.3 10*3/uL (ref 4.0–10.5)
nRBC: 0 % (ref 0.0–0.2)

## 2022-09-06 LAB — BASIC METABOLIC PANEL
Anion gap: 12 (ref 5–15)
BUN: 28 mg/dL — ABNORMAL HIGH (ref 8–23)
CO2: 22 mmol/L (ref 22–32)
Calcium: 9.4 mg/dL (ref 8.9–10.3)
Chloride: 101 mmol/L (ref 98–111)
Creatinine, Ser: 0.91 mg/dL (ref 0.61–1.24)
GFR, Estimated: >60 mL/min (ref >60)
Glucose, Bld: 92 mg/dL (ref 70–99)
Potassium: 3.8 mmol/L (ref 3.5–5.1)
Sodium: 135 mmol/L (ref 135–145)

## 2022-09-06 LAB — URINALYSIS, ROUTINE W REFLEX MICROSCOPIC
Bilirubin Urine: NEGATIVE
Glucose, UA: NEGATIVE mg/dL
Hgb urine dipstick: NEGATIVE
Ketones, ur: NEGATIVE mg/dL
Leukocytes,Ua: NEGATIVE
Nitrite: NEGATIVE
Protein, ur: NEGATIVE mg/dL
Specific Gravity, Urine: 1.014 (ref 1.005–1.030)
pH: 7 (ref 5.0–8.0)

## 2022-09-06 LAB — RAPID HIV SCREEN (HIV 1/2 AB+AG)
HIV 1/2 Antibodies: NONREACTIVE
HIV-1 P24 Antigen - HIV24: NONREACTIVE

## 2022-09-06 LAB — TSH: TSH: 0.618 u[IU]/mL (ref 0.350–4.500)

## 2022-09-06 LAB — RAPID URINE DRUG SCREEN, HOSP PERFORMED
Amphetamines: NOT DETECTED
Barbiturates: NOT DETECTED
Benzodiazepines: NOT DETECTED
Cocaine: NOT DETECTED
Opiates: NOT DETECTED
Tetrahydrocannabinol: NOT DETECTED

## 2022-09-06 LAB — TROPONIN I (HIGH SENSITIVITY): Troponin I (High Sensitivity): 15 ng/L (ref <18)

## 2022-09-06 LAB — T4, FREE: Free T4: 0.97 ng/dL (ref 0.61–1.12)

## 2022-09-06 NOTE — ED Triage Notes (Signed)
Patient has history of alzheimer's. Last weekend symptoms are getting worse. Patient's friend states the Patient may have been taking his medications incorrectly. Thinks the Patient has missed does of the blood pressure med and blood thinner.   AO x 4.

## 2022-09-06 NOTE — ED Notes (Signed)
Patient is being discharged from the Urgent Care and sent to the Emergency Department via POV . Per [facility] PA, patient is in need of higher level of care due to altered mental status in need of blood work. Patient is aware and verbalizes understanding of plan of care.  Vitals:   09/06/22 1025  BP: 122/87  Pulse: 80  Resp: (!) 22  Temp: 98.2 F (36.8 C)  SpO2: 95%

## 2022-09-06 NOTE — Discharge Instructions (Signed)
Please follow-up with your neurologist in the office.  Call their office on Monday to schedule follow-up appointment for these worsening confusion episodes.  Please be very careful to arrange your medications at the start of the week and make sure that you are taking appropriate medications from your pill organizer.  Please avoid driving.

## 2022-09-06 NOTE — ED Provider Notes (Signed)
Here with friend who reports increasing confusion over the last several days. Having problems with memory. He does have alzheimer's but friend reports it's never escalated like this. Also some concerns about not taking medicines correctly; either not enough BP med or possible too much Eliquis.  No weakness, gait change, slurred speech, facial droop. He answers my questions appropriately. Alert and oriented.   Likely needs advanced imaging and blood work. Not available in urgent care setting. Sent to ED across parking lot from UC.   Dyann Goodspeed, Lurena Joiner, [place] 09/06/22 1043

## 2022-09-06 NOTE — ED Provider Notes (Signed)
[facility]MENT AT [facility] Provider Note   CSN: [id number] Arrival date & time: 09/06/22  1046     History  Chief Complaint  Patient presents with   Cognitive Decline    Jerry Foster is a 72 y.o. male seen emerged apartment with concern for cognitive decline.  Supplemental history is provided by the patient's boyfriend present at the bedside.  They reported concern the patient has had progressive and rapid cognitive decline over the past several months, worsening in the past month specifically.  The patient still lives by himself, is able to perform some daily activities, including cooking certain meals.  He does not drive anymore due to episodes of confusion while driving.  He has friends that check in on him and make sure he is eating regularly.  He was notably confused last week when he was [place] with some friends in [place], and apparently "could not pull his words together, was confusing facts and names."    Patient was last seen by neurologist in March by review of the medical records, he noted that the patient's MRI of the brain in August 2023 showed "mild generalized cortical atrophy possibly more pronounced in the medial temporal lobes."  Reported HIV and TSH levels normal or negative in 2022.  Patient has been seen by neurology for cognitive decline, diagnosed potential Alzheimer's and started on donepezil which he takes.  He reports a family history of Alzheimer's in his mother, perhaps in her 63s.  Patient denies any known history of HIV or syphilis.  His partner at the bedside confirms this.  His partner also expressed concern the patient may be taking the wrong medications, noted that the patient's pill organizer was disorganized, and seem to be missing pills, including his blood pressure pill and his Eliquis.  The patient has a history of heart block and pacemaker placement and severe aortic stenosis s/p bioprosthetic AVR  06/2019  HPI     Home Medications Prior to Admission medications   Medication Sig Start Date End Date Taking? Authorizing Provider  acetaminophen (TYLENOL) 500 MG tablet Take 1,000 mg by mouth every 8 (eight) hours as needed for moderate pain or headache.    [provider]  amLODipine (NORVASC) 10 MG tablet TAKE 1 TABLET(10 MG) BY MOUTH DAILY 06/11/22   Tonny Bollman, MD  amoxicillin (AMOXIL) 500 MG capsule as directed. For dental appts    [provider]  apixaban (ELIQUIS) 5 MG TABS tablet TAKE 1 TABLET(5 MG) BY MOUTH TWICE DAILY 05/10/22   Tonny Bollman, MD  Calcium Citrate-Vitamin D (CITRACAL + D PO) Take 2 tablets by mouth in the morning and at bedtime.    [provider]  chlorthalidone (HYGROTON) 25 MG tablet Take 1/2 tablet by mouth daily. 05/27/22   Tonny Bollman, MD  Cholecalciferol (DIALYVITE VITAMIN D 5000) 125 MCG (5000 UT) capsule Take 5,000 Units by mouth daily.    [provider]  donepezil (ARICEPT) 10 MG tablet Take 1 tablet (10 mg total) by mouth at bedtime. 06/12/22   Sater, Pearletha Furl, MD  MELATONIN PO Take by mouth at bedtime.    [provider]  Omega-3 1000 MG CAPS Take by mouth.    [provider]  potassium chloride SA (KLOR-CON M) 20 MEQ tablet Take 1 tablet (20 mEq total) by mouth 2 (two) times daily. 10/30/21   Sater, Pearletha Furl, MD  pravastatin (PRAVACHOL) 40 MG tablet Take 40 mg by mouth daily.  [provider]  sertraline (ZOLOFT) 50 MG tablet Take 1 tablet (50 mg total) by mouth daily. 09/18/21   Sater, Pearletha Furl, MD      Allergies    Lamisil [terbinafine]    Review of Systems   Review of Systems  Physical Exam Updated Vital Signs BP 126/84 (BP Location: Right Arm)   Pulse 74   Temp 98.7 F (37.1 C) (Oral)   Resp (!) 24   SpO2 100%  Physical Exam Constitutional:      General: He is not in acute distress. HENT:     Head: Normocephalic and atraumatic.  Eyes:     Conjunctiva/sclera:  Conjunctivae normal.     Pupils: Pupils are equal, round, and reactive to light.  Cardiovascular:     Rate and Rhythm: Normal rate and regular rhythm.  Pulmonary:     Effort: Pulmonary effort is normal. No respiratory distress.  Abdominal:     General: There is no distension.     Tenderness: There is no abdominal tenderness.  Skin:    General: Skin is warm and dry.  Neurological:     General: No focal deficit present.     Mental Status: He is alert and oriented to person, place, and time. Mental status is at baseline.     Cranial Nerves: No cranial nerve deficit.     Sensory: No sensory deficit.     Motor: No weakness.  Psychiatric:        Mood and Affect: Mood normal.        Behavior: Behavior normal.     ED Results / Procedures / Treatments   Labs (all labs ordered are listed, but only abnormal results are displayed) Labs Reviewed  BASIC METABOLIC PANEL - Abnormal; Notable for the following components:      Result Value   BUN 28 (*)    All other components within normal limits  CBC WITH DIFFERENTIAL/PLATELET - Abnormal; Notable for the following components:   Platelets 137 (*)    All other components within normal limits  URINALYSIS, ROUTINE W REFLEX MICROSCOPIC - Abnormal; Notable for the following components:   APPearance CLOUDY (*)    All other components within normal limits  TSH  T4, FREE  RAPID HIV SCREEN (HIV 1/2 AB+AG)  RAPID URINE DRUG SCREEN, HOSP PERFORMED  RPR  ETHANOL  TROPONIN I (HIGH SENSITIVITY)  TROPONIN I (HIGH SENSITIVITY)    EKG None  Radiology CT Head Wo Contrast  Result Date: 09/06/2022 CLINICAL DATA:  Mental status change, unknown cause. EXAM: CT HEAD WITHOUT CONTRAST TECHNIQUE: Contiguous axial images were obtained from the base of the skull through the vertex without intravenous contrast. RADIATION DOSE REDUCTION: This exam was performed according to the departmental dose-optimization program which includes automated exposure control,  adjustment of the mA and/or kV according to patient size and/or use of iterative reconstruction technique. COMPARISON:  Head MRI 11/05/2021 and head CT 10/29/2021 FINDINGS: Brain: There is no evidence of an acute infarct, intracranial hemorrhage, mass, midline shift, or extra-axial fluid collection. Chronic lacunar infarcts are again seen in the right caudate nucleus and left cerebellar hemisphere. There is mild cerebral atrophy. Vascular: Calcified atherosclerosis at the skull base. Skull: No fracture. 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 Sinuses/Orbits: No significant inflammatory changes in the included paranasal sinuses. Clear mastoid air cells. Unremarkable orbits. Other: None. IMPRESSION: No evidence of acute intracranial abnormality. Electronically Signed  By: Sebastian Ache M.D.   On: 09/06/2022 12:59    Procedures Procedures    Medications Ordered in ED Medications - No data to display  ED Course/ Medical Decision Making/ A&P Clinical Course as of 09/06/22 1642  Fri Sep 06, 2022  1503 Patient is reassessed and remains asymptomatic.  His partner is comfortable taking him home.  At this point I do recommend he follow-up with a neurologist for I suspect chronic progression of his neurocognitive disease [MT]    Clinical Course User Index [MT] Lucifer Soja, Kermit Balo, MD                             Medical Decision Making Amount and/or Complexity of Data Reviewed Labs: ordered. Radiology: ordered.   This patient presents to the ED with concern for cognitive decline, intermittent confusion. This involves an extensive number of treatment options, and is a complaint that carries with it a high risk of complications and morbidity.  The differential diagnosis includes degenerative process, including Alzheimer's disorder other dementia form, which may be rapidly progressing.   Alternatively there may be an acute medical process such as infection, and/or metabolic encephalopathy.    Co-morbidities that complicate the patient evaluation: History of Alzheimer's  Additional history obtained from patient's boyfriend at bedside  External records from outside source obtained and reviewed including neurology office evaluation of prior MRI imaging  I ordered and personally interpreted labs.  The pertinent results include:  no emergent findings  I ordered imaging studies including CT head I independently visualized and interpreted imaging which showed no emergent findings I agree with the radiologist interpretation  The patient was maintained on a cardiac monitor.  I personally viewed and interpreted the cardiac monitored which showed an underlying rhythm of: Sinus rhythm  I have reviewed the patients home medicines and have made adjustments as needed  Test Considered: doubt CNS infection/meningitis/cns emergency - no indication for emergent LP at this time   After the interventions noted above, I reevaluated the patient and found that they have: stayed the same  Patient remains coherent, asymptomatic with no acute complaints.  His boyfriend reporting that they are comfortable going home at this time.  I do not see indication for emergent hospitalization at this time.  Dispostion:  After consideration of the diagnostic results and the patients response to treatment, I feel that the patent would benefit from outpatient follow up with PCP or neurologist.         Final Clinical Impression(s) / ED Diagnoses Final diagnoses:  Confusion    Rx / DC Orders ED Discharge Orders     None         Terald Sleeper, MD 09/06/22 1644

## 2022-09-06 NOTE — ED Triage Notes (Signed)
Pt came in via POV with partner d/t concerns of pt having significant cognitive decline over the past few weeks. He does have Hx of Alzheimer's but is usually very high functioning. Partner reports pt usually takes his own meds & lives alone, but they noticed he may not have been taking his Eliquis or Amlodipine appropriately. Does have a pacemaker.

## 2022-09-07 LAB — RPR: RPR Ser Ql: NONREACTIVE

## 2022-10-22 ENCOUNTER — Ambulatory Visit (HOSPITAL_COMMUNITY): Payer: Medicare Other | Attending: Physician Assistant

## 2022-10-22 ENCOUNTER — Other Ambulatory Visit: Payer: Self-pay | Admitting: Neurology

## 2022-10-22 DIAGNOSIS — G4730 Sleep apnea, unspecified: Secondary | ICD-10-CM | POA: Insufficient documentation

## 2022-10-22 DIAGNOSIS — I350 Nonrheumatic aortic (valve) stenosis: Secondary | ICD-10-CM | POA: Diagnosis present

## 2022-10-22 DIAGNOSIS — I4819 Other persistent atrial fibrillation: Secondary | ICD-10-CM | POA: Diagnosis not present

## 2022-10-22 LAB — ECHOCARDIOGRAM COMPLETE
AR max vel: 1.05 cm2
AV Area VTI: 1 cm2
AV Area mean vel: 1.03 cm2
AV Mean grad: 18 mmHg
AV Peak grad: 33.9 mmHg
Ao pk vel: 2.91 m/s
Area-P 1/2: 2.73 cm2
MV M vel: 5.88 m/s
MV Peak grad: 138.3 mmHg
S' Lateral: 1.8 cm

## 2022-10-22 NOTE — Telephone Encounter (Signed)
Last seen on 06/12/22 Follow up scheduled on 06/12/23

## 2022-11-01 ENCOUNTER — Ambulatory Visit: Payer: Medicare Other

## 2022-11-01 DIAGNOSIS — I442 Atrioventricular block, complete: Secondary | ICD-10-CM

## 2022-11-01 LAB — CUP PACEART REMOTE DEVICE CHECK
Battery Remaining Longevity: 95 mo
Battery Remaining Percentage: 74 %
Battery Voltage: 3.01 V
Brady Statistic AP VP Percent: 1 %
Brady Statistic AP VS Percent: 16 %
Brady Statistic AS VP Percent: 1 %
Brady Statistic AS VS Percent: 84 %
Brady Statistic RA Percent Paced: 14 %
Brady Statistic RV Percent Paced: 1 %
Date Time Interrogation Session: 20240809150244
Implantable Lead Connection Status: 753985
Implantable Lead Connection Status: 753985
Implantable Lead Implant Date: 20210506
Implantable Lead Implant Date: 20210506
Implantable Lead Location: 753859
Implantable Lead Location: 753860
Implantable Pulse Generator Implant Date: 20210506
Lead Channel Impedance Value: 400 Ohm
Lead Channel Impedance Value: 450 Ohm
Lead Channel Pacing Threshold Amplitude: 0.5 V
Lead Channel Pacing Threshold Amplitude: 1 V
Lead Channel Pacing Threshold Pulse Width: 0.4 ms
Lead Channel Pacing Threshold Pulse Width: 0.4 ms
Lead Channel Sensing Intrinsic Amplitude: 2.3 mV
Lead Channel Sensing Intrinsic Amplitude: 5 mV
Lead Channel Setting Pacing Amplitude: 1.25 V
Lead Channel Setting Pacing Amplitude: 1.5 V
Lead Channel Setting Pacing Pulse Width: 0.4 ms
Lead Channel Setting Sensing Sensitivity: 0.7 mV
Pulse Gen Model: 2272
Pulse Gen Serial Number: 3825759

## 2022-11-04 ENCOUNTER — Ambulatory Visit: Payer: Medicare Other | Admitting: Cardiovascular Disease

## 2022-11-06 ENCOUNTER — Other Ambulatory Visit: Payer: Self-pay | Admitting: Cardiovascular Disease

## 2022-11-06 NOTE — Telephone Encounter (Signed)
Pt last saw Tereso Newcomer, PA on 04/24/22, last labs 09/06/22 Creat 0.91, age 72, weight 76.2kg, based on specified criteria pt is on appropriate dosage of Eliquis 5mg  BID for afib.  Will refill rx.

## 2022-11-07 ENCOUNTER — Encounter (HOSPITAL_COMMUNITY): Payer: Self-pay | Admitting: Physician Assistant

## 2022-11-07 ENCOUNTER — Ambulatory Visit (HOSPITAL_COMMUNITY)
Admission: RE | Admit: 2022-11-07 | Discharge: 2022-11-07 | Disposition: A | Discharge status: Home / Self Care | Payer: Medicare Other | Source: Ambulatory Visit | Attending: Physician Assistant | Admitting: Physician Assistant

## 2022-11-07 VITALS — BP 128/88 | HR 68 | Ht 70.0 in | Wt 163.0 lb

## 2022-11-07 DIAGNOSIS — D6869 Other thrombophilia: Secondary | ICD-10-CM | POA: Diagnosis not present

## 2022-11-07 DIAGNOSIS — I10 Essential (primary) hypertension: Secondary | ICD-10-CM | POA: Diagnosis not present

## 2022-11-07 DIAGNOSIS — I455 Other specified heart block: Secondary | ICD-10-CM | POA: Insufficient documentation

## 2022-11-07 DIAGNOSIS — I2510 Atherosclerotic heart disease of native coronary artery without angina pectoris: Secondary | ICD-10-CM | POA: Diagnosis not present

## 2022-11-07 DIAGNOSIS — I4819 Other persistent atrial fibrillation: Secondary | ICD-10-CM | POA: Insufficient documentation

## 2022-11-07 DIAGNOSIS — Z952 Presence of prosthetic heart valve: Secondary | ICD-10-CM | POA: Diagnosis not present

## 2022-11-07 NOTE — Progress Notes (Signed)
Primary Care Physician: Loyal Jacobson, MD Primary Cardiologist: Dr Excell Seltzer Primary Electrophysiologist: Dr Lalla Brothers Referring Physician: Dr Hillis Range Luebke is a 72 y.o. male with a history of RBBB, AS s/p AVR, HLD, tachybradycardia syndrome, transient CHB s/p PPM, persistent atrial fibrillation, and atrial flutter who presents for follow up in the [facility]. The patient was admitted electively and on 07/23/2019 he was taken the operating room where he underwent AVR.  He did develop postoperative atrial fibrillation as well as significant heart block. On 07/28/2019 he had a fall in the bathroom that was felt to be related to the heart block and sustained a significant laceration in the region of his left eyebrow requiring plastic surgery repair. His CT scan was also done at that time and showed an avulsion fracture the anterior inferior aspect of the C6 vertebral body. He was placed in a soft cervical immobilizer. CT scan of the head was unremarkable. Neurosurgery will follow up as an outpatient. He was seen by Dr Johney Frame and a PPM was implanted. He has been started on Coumadin with a CHADS2VASC score of 2. He was also started on amiodarone and converted to SR prior to discharge. His amiodarone and warfarin were eventually discontinued as he had no recurrence of afib.  Patient is s/p afib and flutter ablation with Dr Johney Frame on 03/15/20.   On follow up today, patient reports that he has felt well since his last visit. He remains very active exercising daily. He has been diagnosed with mild cognitive decline and follows with neurology. His PPM shows 3.5% afib burden. He is asymptomatic when in afib. No bleeding issues on anticoagulation.   Today, he denies symptoms of palpitations, shortness of breath, orthopnea, PND, lower extremity edema, syncope, snoring, daytime somnolence, bleeding, or neurologic sequela. The patient is tolerating medications without difficulties  and is otherwise without complaint today.    Atrial Fibrillation Risk Factors:  he does not have symptoms or diagnosis of sleep apnea. Negative sleep study   Atrial Fibrillation Management history:  Previous antiarrhythmic drugs: amiodarone Previous cardioversions: none Previous ablations: 03/15/20 afib and flutter Anticoagulation history: warfarin, Eliquis   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   21 mm Edwards Inspiris Resilia stented bovine pericardial tissue valve via right mini thoracotomy approach   Sensorineural hearing loss of both ears    Snoring 03/20/2021   [facility] Feb 2024: normal   Thrombosed hemorrhoids    hsitory of   Tubular adenoma of colon    Vitamin D deficiency     Current Outpatient Medications  Medication Sig Dispense Refill   acetaminophen (TYLENOL) 500 MG tablet Take 1,000 mg by mouth every 8 (eight) hours as needed for moderate pain or headache.     amLODipine (NORVASC) 10 MG tablet TAKE 1 TABLET(10 MG) BY MOUTH DAILY 90 tablet 3   amoxicillin (AMOXIL) 500 MG capsule  as directed. For dental appts     Calcium Citrate-Vitamin D (CITRACAL + D PO) Take 2 tablets by mouth in the morning and at bedtime.     chlorthalidone (HYGROTON) 25 MG tablet Take 1/2 tablet by mouth daily. 45 tablet 3   Cholecalciferol (DIALYVITE VITAMIN D 5000) 125 MCG (5000 UT) capsule Take 5,000 Units by mouth daily.     donepezil (ARICEPT) 10 MG tablet Take 1 tablet (10 mg total) by mouth at  bedtime. 90 tablet 4   Doxepin HCl 3 MG TABS Take 1 tablet by mouth daily.     ELIQUIS 5 MG TABS tablet TAKE 1 TABLET(5 MG) BY MOUTH TWICE DAILY 60 tablet 5   MELATONIN PO Take by mouth at bedtime.     Omega-3 1000 MG CAPS Take by mouth.     potassium chloride SA (KLOR-CON M) 20 MEQ tablet Take 1 tablet (20 mEq total) by mouth 2 (two) times daily. 30 tablet 5   pravastatin (PRAVACHOL) 40 MG tablet Take 40 mg by mouth daily.     sertraline (ZOLOFT) 50 MG tablet TAKE 1 TABLET(50 MG) BY MOUTH DAILY 30 tablet 8   No current facility-administered medications for this encounter.    ROS- All systems are reviewed and negative except as per the HPI above.  Physical Exam: Vitals:   11/07/22 0935  BP: 128/88  Pulse: 68  Weight: 73.9 kg  Height: 5\' 10"  (1.778 m)     GEN: Well nourished, well developed in no acute distress NECK: No JVD; No carotid bruits CARDIAC: Regular rate and rhythm, no murmurs, rubs, gallops RESPIRATORY:  Clear to auscultation without rales, wheezing or rhonchi  ABDOMEN: Soft, non-tender, non-distended EXTREMITIES:  No edema; No deformity    Wt Readings from Last 3 Encounters:  11/07/22 73.9 kg  09/06/22 76.2 kg  06/12/22 76.2 kg    EKG today demonstrates  SR, RBBB Vent. rate 68 BPM PR interval 184 ms QRS duration 156 ms QT/QTcB 432/459 ms  Echo 12/08/19 demonstrated   1. 21 mm AoV bioprosthesis. MG up to 18 mmHG on this study, but remains  within limits. The aortic valve has been repaired/replaced. Aortic valve  regurgitation is not visualized. There is a 21 mm bioprosthetic valve  present in the aortic position.  Procedure Date: 07/23/2019. Echo findings are consistent with normal  structure and function of the aortic valve prosthesis. Aortic valve area,  by VTI measures 1.00 cm. Aortic valve mean gradient measures 18.0 mmHg.  Aortic valve Vmax measures 2.91 m/s.   2. Left ventricular ejection fraction, by estimation, is 70 to 75%. The  left ventricle  has hyperdynamic function. The left ventricle has no  regional wall motion abnormalities. There is mild concentric left  ventricular hypertrophy. Left ventricular  diastolic parameters are consistent with Grade I diastolic dysfunction  (impaired relaxation). The average left ventricular global longitudinal  strain is -26.1 %. The global longitudinal strain is normal.   3. Right ventricular systolic function is normal. The right ventricular  size is normal. There is mildly elevated pulmonary artery systolic  pressure. The estimated right ventricular systolic pressure is 36.9 mmHg.   4. The mitral valve is grossly normal. Mild mitral valve regurgitation.  No evidence of mitral stenosis.   5. The inferior vena cava is normal in size with <50% respiratory  variability, suggesting right atrial pressure of 8 mmHg.   Epic records are reviewed at length today  CHA2DS2-VASc Score = 2  The patient's score is based  upon: CHF History: 0 HTN History: 0 Diabetes History: 0 Stroke History: 0 Vascular Disease History: 1 Age Score: 1 Gender Score: 0      ASSESSMENT AND PLAN: Persistent Atrial Fibrillation/atrial flutter The patient's CHA2DS2-VASc score is 2, indicating a 2.2% annual risk of stroke.   S/p afib and flutter ablation 03/15/20 AF burden on device 3.5%, patient asymptomatic.  Continue Eliquis 5 mg BID  Secondary Hypercoagulable State (ICD10:  D68.69) The patient is at significant risk for stroke/thromboembolism based upon his CHA2DS2-VASc Score of 2.  Continue Apixaban (Eliquis).   Transient CHB S/p PPM, followed by Dr Lalla Brothers and the device clinic  Aortic stenosis S/p bioprosthetic AVR 06/2019.  CAD Nonobstructive by cath 05/2019 No anginal symptoms  HTN Stable on current regimen   Follow up with Dr Lalla Brothers in 6 months.    Jorja Loa PA-C [facility] [address] [place], [place] [address] [phone] 11/07/2022 9:42 AM

## 2022-11-13 ENCOUNTER — Ambulatory Visit: Payer: Medicare Other | Attending: Cardiovascular Disease | Admitting: Cardiovascular Disease

## 2022-11-13 ENCOUNTER — Encounter: Payer: Self-pay | Admitting: Cardiovascular Disease

## 2022-11-13 VITALS — BP 108/80 | HR 63 | Ht 67.5 in | Wt 161.0 lb

## 2022-11-13 DIAGNOSIS — I442 Atrioventricular block, complete: Secondary | ICD-10-CM

## 2022-11-13 DIAGNOSIS — I10 Essential (primary) hypertension: Secondary | ICD-10-CM

## 2022-11-13 DIAGNOSIS — I350 Nonrheumatic aortic (valve) stenosis: Secondary | ICD-10-CM

## 2022-11-13 DIAGNOSIS — I4819 Other persistent atrial fibrillation: Secondary | ICD-10-CM | POA: Diagnosis not present

## 2022-11-13 NOTE — Progress Notes (Signed)
Cardiology Office Note:    Date:  11/13/2022   ID:  Jerry Foster, DOB 05-Apr-1950, MRN [id number]  PCP:  Loyal Jacobson, MD   [facility] Providers Cardiologist:  Tonny Bollman, MD Electrophysiologist:  Hillis Range, MD (Inactive)     Referring MD: Loyal Jacobson, MD   Chief Complaint  Patient presents with   Follow-up    Aortic valve disease    History of Present Illness:    Jerry Foster is a 72 y.o. male with a hx of:  Aortic stenosis S/p min inv bioprosthetic AVR 06/2019 Post op c/b AFib and high grade HB >> fall with lac to L eyebrow, C6 avulsion fx >> pacemaker Limited TTE 12/08/19: EF 60-65, no RWMA, moderate LVH, GR 1 DD, normal RVSF, normally functioning AVR with mean gradient 15 mmHg Coronary artery disease  Mild, non-obs dz by cath 06/08/2019 Persistent atrial fibrillation  Post op >> recurrent after DC of Amiodarone   Isthmus dependent AFlutter S/p AFlutter ablation and PVI ablation for AFib in 02/2020 ([facility]) Mobitz II AV block S/p Pacemaker  Hyperlipidemia RBBB Pericarditis (11/2019) Carotid US 07/19/19: Bilateral ICA 1-39 Aortic atherosclerosis  Alzheimer's Dementia  The patient is here with his partner today.  He notes some progressive memory impairment and he quit driving a few months ago.  He is being followed by a neurocognitive specialist now.  From a cardiac perspective, he feels like he is doing well.  He does report some transient neurologic symptoms that have been concerning for TIAs.  He has developed expressive aphasia on a few different occasions.  Aspirin has been added to his medical regimen.  He continues on apixaban.  He has further testing coming up with a carotid ultrasound and CT scan of the brain.  From a cardiac standpoint, he denies chest pain, heart palpitations, shortness of breath with activity, edema, orthopnea, or PND.  He does complain of some generalized fatigue at the end of the day.  Past Medical History:  Diagnosis  Date   Aortic stenosis    Benign prostatic hyperplasia    Bronchitis    Cataract    bilateral    CHF (congestive heart failure) (HCC)    Colon polyps    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   Heart murmur    History of fracture of vertebral column    Hyperlipidemia    Kidney stone    Nonrheumatic aortic valve stenosis    Osteoporosis    Osteoporosis    Pure hypercholesterolemia    RBBB    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   21 mm Edwards Inspiris Resilia stented bovine pericardial tissue valve via right mini thoracotomy approach   Sensorineural hearing loss of both ears    Snoring 03/20/2021   [facility] Feb 2024: normal   Thrombosed hemorrhoids    hsitory of   Tubular adenoma of colon    Vitamin D deficiency     Past Surgical History:  Procedure Laterality Date   AORTIC VALVE REPLACEMENT N/A 07/23/2019   Procedure: MINIMALLY INVASIVE AORTIC VALVE REPLACEMENT (AVR) using Cyndee Brightly Resilia 21 MM Aortic Valve.;  Surgeon: Purcell Nails, MD;  Location: [facility] OR;  Service: Open Heart Surgery;  Laterality: N/A;   ATRIAL FIBRILLATION ABLATION N/A 03/15/2020   Procedure: ATRIAL FIBRILLATION ABLATION;  Surgeon: Hillis Range, MD;  Location: [facility] INVASIVE CV LAB;  Service: Cardiovascular;  Laterality: N/A;   CARDIAC CATHETERIZATION     COLON SURGERY  2006  1 foot ascending colon removed    COLONOSCOPY     HERNIA REPAIR     PACEMAKER IMPLANT N/A 07/29/2019   Procedure: PACEMAKER IMPLANT;  Surgeon: Hillis Range, MD;  Location: [facility] INVASIVE CV LAB;  Service: Cardiovascular;  Laterality: N/A;   POLYPECTOMY     RIGHT HEART CATH AND CORONARY ANGIOGRAPHY N/A 06/08/2019   Procedure: RIGHT HEART CATH AND CORONARY ANGIOGRAPHY;  Surgeon: Tonny Bollman, MD;  Location: [facility] INVASIVE CV LAB;  Service: Cardiovascular;  Laterality: N/A;   TEE WITHOUT CARDIOVERSION N/A 07/23/2019   Procedure: TRANSESOPHAGEAL ECHOCARDIOGRAM (TEE);   Surgeon: Purcell Nails, MD;  Location: [facility] OR;  Service: Open Heart Surgery;  Laterality: N/A;   TESTICLE SURGERY      Current Medications: Current Meds  Medication Sig   acetaminophen (TYLENOL) 500 MG tablet Take 1,000 mg by mouth every 8 (eight) hours as needed for moderate pain or headache.   amLODipine (NORVASC) 10 MG tablet TAKE 1 TABLET(10 MG) BY MOUTH DAILY   amoxicillin (AMOXIL) 500 MG capsule as directed. For dental appts   Calcium Citrate-Vitamin D (CITRACAL + D PO) Take 1 tablet by mouth in the morning and at bedtime.   chlorthalidone (HYGROTON) 25 MG tablet Take 1/2 tablet by mouth daily.   Cholecalciferol (DIALYVITE VITAMIN D 5000) 125 MCG (5000 UT) capsule Take 5,000 Units by mouth daily.   donepezil (ARICEPT) 10 MG tablet Take 1 tablet (10 mg total) by mouth at bedtime.   Doxepin HCl 3 MG TABS Take 1 tablet by mouth daily.   ELIQUIS 5 MG TABS tablet TAKE 1 TABLET(5 MG) BY MOUTH TWICE DAILY   MELATONIN PO Take by mouth at bedtime.   Omega-3 1000 MG CAPS Take by mouth.   potassium chloride SA (KLOR-CON M) 20 MEQ tablet Take 1 tablet (20 mEq total) by mouth 2 (two) times daily.   pravastatin (PRAVACHOL) 40 MG tablet Take 40 mg by mouth daily.   sertraline (ZOLOFT) 50 MG tablet TAKE 1 TABLET(50 MG) BY MOUTH DAILY     Allergies:   Lamisil [terbinafine]   Social History   Socioeconomic History   Marital status: Single    Spouse name: Not on file   Number of children: Not on file   Years of education: Not on file   Highest education level: Professional school degree (e.g., MD, DDS, DVM, JD)  Occupational History   Occupation: Retired Education officer, community  Tobacco Use   Smoking status: Never   Smokeless tobacco: Never   Tobacco comments:    Never smoke 11/06/21  Vaping Use   Vaping status: Never Used  Substance and Sexual Activity   Alcohol use: Yes    Alcohol/week: 6.0 - 8.0 standard drinks of alcohol    Types: 6 - 8 Glasses of wine per week    Comment: 2 glass 3-4 times a  week 05/08/21   Drug use: No   Sexual activity: Not on file  Other Topics Concern   Not on file  Social History Narrative   Lives alone, currently has a friend living with him til around April   Left handed   Caffeine: 5 cups of coffee in the AM   Social Determinants of Health   Financial Resource Strain: Low Risk  (11/21/2020)   [facility] from [facility] visits prior to 05/25/2022., [facility] visits prior to 05/25/2022.   Overall Financial Resource Strain (CARDIA)    Difficulty of Paying Living Expenses: Not hard at all  Food Insecurity: No Food Insecurity (11/21/2020)   Received from [facility] visits prior to 05/25/2022., [facility] visits prior to 05/25/2022.   Hunger Vital Sign    Worried About Running Out of Food in the Last Year: Never true    Ran Out of Food in the Last Year: Never true  Transportation Needs: No Transportation Needs (11/21/2020)   [facility] from [facility] visits prior to 05/25/2022., [facility] visits prior to 05/25/2022.   PRAPARE - Administrator, Civil Service (Medical): No    Lack of Transportation (Non-Medical): No  Physical Activity: Sufficiently Active (11/21/2020)   Received from [facility] visits prior to 05/25/2022., [facility] visits prior to 05/25/2022.   Exercise Vital Sign    Days of Exercise per Week: 4 days    Minutes of Exercise per Session: 40 min  Stress: No Stress Concern Present (11/21/2020)   Received from [facility] visits prior to 05/25/2022., [facility] visits prior to 05/25/2022.   [employer] of Occupational Health - Occupational Stress Questionnaire    Feeling of Stress : Only a little  Social Connections: Moderately Isolated (11/21/2020)   Received from [facility] visits prior to  05/25/2022., [facility] visits prior to 05/25/2022.   Social Advertising account executive [NHANES]    Frequency of Communication with Friends and Family: More than three times a week    Frequency of Social Gatherings with Friends and Family: More than three times a week    Attends Religious Services: Never    Database administrator or Organizations: Yes    Attends Engineer, structural: More than 4 times per year    Marital Status: Widowed     Family History: The patient's family history includes Heart disease in his maternal uncle; Hypertension in his brother. There is no history of Colon cancer, Colon polyps, Esophageal cancer, Rectal cancer, or Stomach cancer.  ROS:   Please see the history of present illness.    All other systems reviewed and are negative.  EKGs/Labs/Other Studies Reviewed:    The following studies were reviewed today: Echo 10/22/2022: 1. 21 mm AoV bioprosthesis. MG up to 18 mmHG on this study, but remains  within limits. The aortic valve has been repaired/replaced. Aortic valve  regurgitation is not visualized. There is a 21 mm bioprosthetic valve  present in the aortic position.  Procedure Date: 07/23/2019. Echo findings are consistent with normal  structure and function of the aortic valve prosthesis. Aortic valve area,  by VTI measures 1.00 cm. Aortic valve mean gradient measures 18.0 mmHg.  Aortic valve Vmax measures 2.91 m/s.   2. Left ventricular ejection fraction, by estimation, is 70 to 75%. The  left ventricle has hyperdynamic function. The left ventricle has no  regional wall motion abnormalities. There is mild concentric left  ventricular hypertrophy. Left ventricular  diastolic parameters are consistent with Grade I diastolic dysfunction  (impaired relaxation). The average left ventricular global longitudinal  strain is -26.1 %. The global longitudinal strain is normal.   3. Right ventricular systolic function is  normal. The right ventricular  size is normal. There is mildly elevated pulmonary artery systolic  pressure. The estimated right ventricular systolic pressure is 36.9 mmHg.   4. The mitral valve is grossly normal. Mild mitral valve regurgitation.  No  evidence of mitral stenosis.   5. The inferior vena cava is normal in size with <50% respiratory  variability, suggesting right atrial pressure of 8 mmHg.       Recent Labs: 09/06/2022: BUN 28; Creatinine, Ser 0.91; Hemoglobin 16.6; Platelets 137; Potassium 3.8; Sodium 135; TSH 0.618  Recent Lipid Panel No results found for: "CHOL", "TRIG", "HDL", "CHOLHDL", "VLDL", "LDLCALC", "LDLDIRECT"   Risk Assessment/Calculations:    CHA2DS2-VASc Score = 2   This indicates a 2.2% annual risk of stroke. The patient's score is based upon: CHF History: 0 HTN History: 0 Diabetes History: 0 Stroke History: 0 Vascular Disease History: 1 Age Score: 1 Gender Score: 0          STOP-Bang Score:  5       Physical Exam:    VS:  BP 108/80   Pulse 63   Ht 5' 7.5" (1.715 m)   Wt 161 lb (73 kg)   SpO2 98%   BMI 24.84 kg/m     Wt Readings from Last 3 Encounters:  11/13/22 161 lb (73 kg)  11/07/22 163 lb (73.9 kg)  09/06/22 167 lb 15.9 oz (76.2 kg)     GEN:  Well nourished, well developed in no acute distress HEENT: Normal NECK: No JVD; No carotid bruits LYMPHATICS: No lymphadenopathy CARDIAC: RRR, there is a 2/6 systolic ejection murmur at the right upper sternal border with no diastolic murmur RESPIRATORY:  Clear to auscultation without rales, wheezing or rhonchi  ABDOMEN: Soft, non-tender, non-distended MUSCULOSKELETAL:  No edema; No deformity  SKIN: Warm and dry NEUROLOGIC:  Alert and oriented x 3 PSYCHIATRIC:  Normal affect   ASSESSMENT:    1. Persistent atrial fibrillation (HCC)   2. Heart block AV complete (HCC)   3. Nonrheumatic aortic valve stenosis   4. Primary hypertension    PLAN:    In order of problems listed  above:  Recent notes reviewed from the A-fib clinic.  His most recent AF burden is 3.5% on device interrogation.  Patient is tolerating apixaban 5 mg twice daily.  Aspirin has been added to his medical regimen with concern for TIAs. Status post permanent pacemaker, followed in device clinic. Patient status post bioprosthetic aortic valve replacement in 2021.  Recent echo reviewed with normal function of his aortic valve bioprosthesis.  The patient has grade 1 diastolic dysfunction and vigorous LVEF of 70 to 75%. Blood pressure controlled on current medical regimen with amlodipine 10 mg daily.      Medication Adjustments/Labs and Tests Ordered: Current medicines are reviewed at length with the patient today.  Concerns regarding medicines are outlined above.  No orders of the defined types were placed in this encounter.  No orders of the defined types were placed in this encounter.   Patient Instructions  Medication Instructions:  Your physician recommends that you continue on your current medications as directed. Please refer to the Current Medication list given to you today.  *If you need a refill on your cardiac medications before your next appointment, please call your pharmacy*   Lab Work: NONE If you have labs (blood work) drawn today and your tests are completely normal, you will receive your results only by: MyChart Message (if you have MyChart) OR A paper copy in the mail If you have any lab test that is abnormal or we need to change your treatment, we will call you to review the results.   Testing/Procedures: NONE   Follow-Up: At [facility], you and your  health needs are our priority.  As part of our continuing mission to provide you with exceptional heart care, we have created designated Provider Care Teams.  These Care Teams include your primary Cardiologist (physician) and Advanced Practice Providers (APPs -  Physician Assistants and Nurse Practitioners) who  all work together to provide you with the care you need, when you need it.  We recommend signing up for the patient portal called "MyChart".  Sign up information is provided on this After Visit Summary.  MyChart is used to connect with patients for Virtual Visits (Telemedicine).  Patients are able to view lab/test results, encounter notes, upcoming appointments, etc.  Non-urgent messages can be sent to your provider as well.   To learn more about what you can do with MyChart, go to [email or web address].    Your next appointment:   1 year(s)  Provider:   Tonny Bollman, MD        Signed, Tonny Bollman, MD  11/13/2022 4:57 PM    [facility]

## 2022-11-13 NOTE — Patient Instructions (Signed)

## 2022-11-15 NOTE — Progress Notes (Signed)
Remote pacemaker transmission.   

## 2022-11-27 ENCOUNTER — Ambulatory Visit: Payer: Medicare Other | Admitting: Neurology

## 2022-11-27 ENCOUNTER — Encounter: Payer: Self-pay | Admitting: Neurology

## 2022-11-27 VITALS — BP 107/73 | HR 66 | Ht 70.0 in | Wt 163.0 lb

## 2022-11-27 DIAGNOSIS — Z9189 Other specified personal risk factors, not elsewhere classified: Secondary | ICD-10-CM

## 2022-11-27 DIAGNOSIS — R29818 Other symptoms and signs involving the nervous system: Secondary | ICD-10-CM | POA: Diagnosis not present

## 2022-11-27 DIAGNOSIS — R4701 Aphasia: Secondary | ICD-10-CM

## 2022-11-27 DIAGNOSIS — G4719 Other hypersomnia: Secondary | ICD-10-CM

## 2022-11-27 DIAGNOSIS — G4733 Obstructive sleep apnea (adult) (pediatric): Secondary | ICD-10-CM

## 2022-11-27 MED ORDER — PRAVASTATIN SODIUM 80 MG PO TABS: 80.0000 mg | ORAL_TABLET | Freq: Every day | ORAL | 3 refills | Status: DC

## 2022-11-27 NOTE — Progress Notes (Signed)
[facility]  PATIENT: Jerry Foster DOB: 1950/11/26  REFERRING DOCTOR OR PCP: Loyal Jacobson, MD SOURCE: Patient, notes from primary care, laboratory reports.  _________________________________   HISTORICAL  CHIEF COMPLAINT:  Chief Complaint  Patient presents with   Room 10    Pt is here with his Brother. Pt states that his memory hasn't gotten any better. Pt states that he has a hard time remembering names.     HISTORY OF PRESENT ILLNESS:  Jerry Foster, at [facility] for neurologic consultation regarding his memory loss and episodes of dizziness.  UPDATE 11/27/2022: He feels he is having more memory issues.   He is no longer driving.   He has had 3 episodes of aphasia.  With one, he was talking on the phone but ten was unable to speak for about 8 minutes.   He could fully understand the other caller.    He had 2 other episodes lasting 5-8 minutes with garbled speech and cna not recall if he had any issues understanding.  All episodes were witnessed.  No one told him he had a facial droop and he was able to stand and walk.      He was on Eliquis for a prosthetic heart valve and Atrial Fibrillation.     He is already on pravastatin 40 mg for hyperlipidemia.   LDL was 117.  TChol was 189 most recently   EPWORTH SLEEPINESS SCALE  On a scale of 0 - 3 what is the chance of dozing:  Sitting and Reading:   2 Watching TV:    3 Sitting inactive in a public place: 1 Passenger in car for one hour: 3 Lying down to rest in the afternoon: 0 Sitting and talking to someone: 0 Sitting quietly after lunch:  1 In a car, stopped in traffic:  0  Total (out of 24):    10/24   mild EDS    CT 11/13/2022: No acute findings.    Mild stable atrophy  Carotid 11/20/2022 1. Moderate amount of left-sided atherosclerotic plaque, not  resulting in a hemodynamically significant stenosis.  2. Unremarkable sonographic evaluation of the right carotid system.    ECHO 10/22/2022 1. 21 mm AoV bioprosthesis. MG up to 18 mmHG on this study, but remains within limits. The aortic valve has been repaired/ replaced. Aortic valve regurgitation is not visualized. There is a 21 mm bioprosthetic valve present in the aortic position. Procedure Date: 4/ 30/ 2021. Echo findings are consistent with normal structure and function of the aortic valve prosthesis. Aortic valve area, by VTI measures 1. 00 cm . Aortic valve mean gradient measures 18. 0 mmHg. Aortic valve Vmax measures 2. 91 m/ s. 2. Left ventricular ejection fraction, by estimation, is 70 to 75% . The left ventricle has hyperdynamic function. The left ventricle has no regional wall motion abnormalities. There is mild concentric left ventricular hypertrophy. Left ventricular diastolic parameters are consistent with Grade I diastolic dysfunction ( impaired relaxation) . The average left ventricular global longitudinal strain is - 26. 1 % . The global longitudinal strain is normal. 3. Right ventricular systolic function is normal. The right ventricular size is normal. There is mildly elevated pulmonary artery systolic pressure. The estimated right ventricular systolic pressure is 36. 9 mmHg. 4. The mitral valve is grossly normal. Mild mitral valve regurgitation. No evidence of mitral stenosis. 5. The inferior vena cava is normal in size with < 50% respiratory variability, suggesting right atrial pressure of 8 mmHg.  His amyloid 42/40 ratio was mildly reduced and [id number] was mildly increased on 02/18/2022.    However, the amyloid PET scan 01/25/2022 was equivocal.      He continues to note difficulty with his memory.    He has gotten lost near his home.  He now needs to use GPS more.   He is still reading and has memory of some of what he reads but not everything.   He spent a few hours at a friends new home but could not remember anything about the visit.    He needs to write things down more.  He has more trouble getting  names out sometimes.   He always does better with hints.      He scored 26/30 on the [facility] cognitive assessment today which is consistent with mild cognitive impairment.  He lost 3 points for delayed recall.  However, about 15 to 20 minutes later when I asked him about the 5 words, he got 3 correct without a prompt and an additional 1 with a category prompt..  Of note, he is educated (retired Education officer, community) so a score of 26 is certainly abnormal.       Earlier this year, because he had snoring and mild excessive daytime sleepiness we ordered a home sleep study.  It did not show significant sleep apnea.  He has not noted more episodes of dizziness.  At MR angiogram was fine.  He had a AVR in 07/2019 followed by cardiac arrest and pacemaker placement. The surgery was performed with cardioplegia pump (time 159 minutes).  He developed AFib and had ablation January 2022 with good results.      His cardiologist is Dr. Excell Seltzer.      06/12/2022    3:10 PM 01/09/2022    9:48 AM 09/18/2021    9:28 AM 03/20/2021    8:35 AM  Montreal Cognitive Assessment   Visuospatial/ Executive (0/5) 5 5 5 4   Naming (0/3) 3 3 3 3   Attention: Read list of digits (0/2) 2 2 2 2   Attention: Read list of letters (0/1) 1 1 1 1   Attention: Serial 7 subtraction starting at 100 (0/3) 3 2 3 3   Language: Repeat phrase (0/2) 2 2 2 2   Language : Fluency (0/1) 1 1 0 1  Abstraction (0/2) 2 2 2 2   Delayed Recall (0/5) 5 2 0 3  Orientation (0/6) 6 6 6 6   Total 30 26 24 27   Adjusted Score (based on education) 30  24      His mother and several aunts and maternal GM had Alzheimer.   His mother started having issues at age 43 while others were in 59's and 22's Is father lived to late 54's with no cognitive issues.     Data: I personally reviewed the CT scan from 07/28/2019.  It shows mild generalized cortical atrophy that is typical for age.  There do not appear to be any large vessel strokes or acute findings in the brain.  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  MRI of the brain 11/05/2021 shows mild generalized cortical atrophy, possibly a little more pronounced in the medial temporal lobes.  It is unchanged compared to the CT scan from 07/28/2019 and 04/12/2021.  TSH, B12, Vit D and HIV were all normal or negative between 06/2020 and 11/2020.       REVIEW OF SYSTEMS: Constitutional: No fevers, chills, sweats, or change  in appetite Eyes: No visual changes, double vision, eye pain Ear, nose and throat: No hearing loss, ear pain, nasal congestion, sore throat Cardiovascular: No chest pain, palpitations Respiratory:  No shortness of breath at rest or with exertion.   No wheezes GastrointestinaI: No nausea, vomiting, diarrhea, abdominal pain, fecal incontinence Genitourinary:  No dysuria, urinary retention or frequency.  No nocturia. Musculoskeletal:  No neck pain, back pain Integumentary: No rash, pruritus, skin lesions Neurological: as above Psychiatric: No depression at this time.  No anxiety Endocrine: No palpitations, diaphoresis, change in appetite, change in weigh or increased thirst Hematologic/Lymphatic:  No anemia, purpura, petechiae. Allergic/Immunologic: No itchy/runny eyes, nasal congestion, recent allergic reactions, rashes  ALLERGIES: Allergies  Allergen Reactions   Lamisil [Terbinafine]     Elevated liver enzymes (LFTs)    HOME MEDICATIONS:  Current Outpatient Medications:    acetaminophen (TYLENOL) 500 MG tablet, Take 1,000 mg by mouth every 8 (eight) hours as needed for moderate pain or headache., Disp: , Rfl:    amLODipine (NORVASC) 10 MG tablet, TAKE 1 TABLET(10 MG) BY MOUTH DAILY, Disp: 90 tablet, Rfl: 3   Calcium Citrate-Vitamin D (CITRACAL + D PO), Take 1 tablet by mouth in the morning and at bedtime., Disp: , Rfl:    chlorthalidone (HYGROTON) 25 MG tablet, Take 1/2 tablet by mouth daily., Disp: 45 tablet, Rfl: 3    Cholecalciferol (DIALYVITE VITAMIN D 5000) 125 MCG (5000 UT) capsule, Take 5,000 Units by mouth daily., Disp: , Rfl:    donepezil (ARICEPT) 10 MG tablet, Take 1 tablet (10 mg total) by mouth at bedtime., Disp: 90 tablet, Rfl: 4   Doxepin HCl 3 MG TABS, Take 1 tablet by mouth daily., Disp: , Rfl:    ELIQUIS 5 MG TABS tablet, TAKE 1 TABLET(5 MG) BY MOUTH TWICE DAILY, Disp: 60 tablet, Rfl: 5   MELATONIN PO, Take by mouth at bedtime., Disp: , Rfl:    Omega-3 1000 MG CAPS, Take by mouth., Disp: , Rfl:    potassium chloride SA (KLOR-CON M) 20 MEQ tablet, Take 1 tablet (20 mEq total) by mouth 2 (two) times daily., Disp: 30 tablet, Rfl: 5   sertraline (ZOLOFT) 50 MG tablet, TAKE 1 TABLET(50 MG) BY MOUTH DAILY, Disp: 30 tablet, Rfl: 8   amoxicillin (AMOXIL) 500 MG capsule, as directed. For dental appts (Patient not taking: Reported on 11/27/2022), Disp: , Rfl:    pravastatin (PRAVACHOL) 80 MG tablet, Take 1 tablet (80 mg total) by mouth daily., Disp: 90 tablet, Rfl: 3  PAST MEDICAL HISTORY: 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   21 mm Edwards Inspiris Resilia stented bovine pericardial tissue valve via right mini thoracotomy approach   Sensorineural hearing loss of both ears    Snoring 03/20/2021   [facility] Study Feb 2024: normal   Thrombosed hemorrhoids    hsitory of   Tubular adenoma of colon    Vitamin D deficiency  PAST SURGICAL HISTORY: Past Surgical History:  Procedure Laterality Date   AORTIC VALVE  REPLACEMENT N/A 07/23/2019   Procedure: MINIMALLY INVASIVE AORTIC VALVE REPLACEMENT (AVR) using Cyndee Brightly Resilia 21 MM Aortic Valve.;  Surgeon: Purcell Nails, MD;  Location: [facility] OR;  Service: Open Heart Surgery;  Laterality: N/A;   ATRIAL FIBRILLATION ABLATION N/A 03/15/2020   Procedure: ATRIAL FIBRILLATION ABLATION;  Surgeon: Hillis Range, MD;  Location: [facility] INVASIVE CV LAB;  Service: Cardiovascular;  Laterality: N/A;   CARDIAC CATHETERIZATION     COLON SURGERY  2006   1 foot ascending colon removed    COLONOSCOPY     HERNIA REPAIR     PACEMAKER IMPLANT N/A 07/29/2019   Procedure: PACEMAKER IMPLANT;  Surgeon: Hillis Range, MD;  Location: [facility] INVASIVE CV LAB;  Service: Cardiovascular;  Laterality: N/A;   POLYPECTOMY     RIGHT HEART CATH AND CORONARY ANGIOGRAPHY N/A 06/08/2019   Procedure: RIGHT HEART CATH AND CORONARY ANGIOGRAPHY;  Surgeon: Tonny Bollman, MD;  [facility] INVAS[facility] CV LAB;  Service: Cardiovascular;  Laterality: N/A;   TEE WITHOUT CARDIOVERSION N/A 07/23/2019   Procedure: TRANSESOPHAGEAL ECHOCARDIOGRAM (TEE);  Surgeon: Purcell Nails, MD;  Location: [facility] OR;  Service: Open Heart Surgery;  Laterality: N/A;   TESTICLE SURGERY      FAMILY HISTORY: Family History  Problem Relation Age of Onset   Heart disease Maternal Uncle    Hypertension Brother    Colon cancer Neg Hx    Colon polyps Neg Hx    Esophageal cancer Neg Hx    Rectal cancer Neg Hx    Stomach cancer Neg Hx     SOCIAL HISTORY:  Social History   Socioeconomic History   Marital status: Single    Spouse name: Not on file   Number of children: Not on file   Years of education: Not on file   Highest education level: Professional school degree (e.g., MD, DDS, DVM, JD)  Occupational History   Occupation: Retired Education officer, community  Tobacco Use   Smoking status: Never   Smokeless tobacco: Never   Tobacco comments:    Never smoke 11/06/21  Vaping Use   Vaping status: Never Used  Substance and Sexual  Activity   Alcohol use: Yes    Alcohol/week: 6.0 - 8.0 standard drinks of alcohol    Types: 6 - 8 Glasses of wine per week    Comment: 2 glass 3-4 times a week 05/08/21   Drug use: No   Sexual activity: Not on file  Other Topics Concern   Not on file  Social History Narrative   Lives alone, currently has a friend living with him til around April   Left handed   Caffeine: 5 cups of coffee in the AM   Social Determinants of Health   Financial Resource Strain: Low Risk  (11/21/2020)   Received from [facility] visits prior to 05/25/2022., [facility] visits prior to 05/25/2022.   Overall Financial Resource Strain (CARDIA)    Difficulty of Paying Living Expenses: Not hard at all  Food Insecurity: No Food Insecurity (11/21/2020)   [facility] from [facility] prior to 05/25/2022., [facility] visits prior to 05/25/2022.   Hunger Vital Sign    Worried About Running Out of Food in the Last Year: Never true    Ran Out of Food in the Last Year: Never true  Transportation Needs: No Transportation Needs (11/21/2020)   Received  from [facility] visits prior to 05/25/2022., [facility] visits prior to 05/25/2022.   PRAPARE - Administrator, Civil Service (Medical): No    Lack of Transportation (Non-Medical): No  Physical Activity: Sufficiently Active (11/21/2020)   Received from [facility] visits prior to 05/25/2022., [facility] visits prior to 05/25/2022.   Exercise Vital Sign    Days of Exercise per Week: 4 days    Minutes of Exercise per Session: 40 min  Stress: No Stress Concern Present (11/21/2020)   Received from [facility] visits prior to 05/25/2022., [facility] visits prior to 05/25/2022.   [employer] of Occupational Health - Occupational Stress Questionnaire     Feeling of Stress : Only a little  Social Connections: Moderately Isolated (11/21/2020)   Received from [facility] visits prior to 05/25/2022., [facility] visits prior to 05/25/2022.   Social Advertising account executive [NHANES]    Frequency of Communication with Friends and Family: More than three times a week    Frequency of Social Gatherings with Friends and Family: More than three times a week    Attends Religious Services: Never    Database administrator or Organizations: Yes    Attends Engineer, structural: More than 4 times per year    Marital Status: Widowed  Intimate Partner Violence: Not At Risk (11/21/2020)   Received from [facility] visits prior to 05/25/2022., [facility] visits prior to 05/25/2022.   Humiliation, Afraid, Rape, and Kick questionnaire    Fear of Current or Ex-Partner: No    Emotionally Abused: No    Physically Abused: No    Sexually Abused: No     PHYSICAL EXAM  Vitals:   11/27/22 1444  BP: 107/73  Pulse: 66  Weight: 163 lb (73.9 kg)  Height: 5\' 10"  (1.778 m)    Body mass index is 23.39 kg/m.   General: The patient is well-developed and well-nourished and in no acute distress  HEENT:  Head is [place]/AT.  Sclera are anicteric.    Neck: Good range of motion.  Skin: Extremities are without rash.  He has mild edema at the ankles.  Neurologic Exam  Mental status: The patient is alert and oriented x 3 at the time of the examination.  Details of the [facility] cognitive assessment are above.   Memory seemed appropriate today.  Cranial nerves: Extraocular movements are full.  Facial strength and sensation was normal.  The tongue is midline, and the patient has symmetric elevation of the soft palate. No obvious hearing deficits are noted.  Motor:  Muscle bulk is normal.   Tone is normal. Strength is  5 / 5 in all 4 extremities.   Sensory: Normal sensation to  touch and temperature in the arms.  Probably normal sensation in the legs (mild asymmetry medially in the legs but symmetric elsewhere in the legs  Coordination: Cerebellar testing reveals good finger-nose-finger and heel-to-shin bilaterally.  Gait and station: Station is normal.   Gait is normal. Tandem gait is normal for age. Romberg is negative.   Reflexes: Deep tendon reflexes are symmetric and normal bilaterally.   Plantar responses are flexor.    DIAGNOSTIC DATA (LABS, IMAGING, TESTING) - I reviewed patient records, labs, notes, testing and imaging myself where available.  Lab Results  Component Value Date  WBC 7.3 09/06/2022   HGB 16.6 09/06/2022   HCT 49.8 09/06/2022   MCV 93.4 09/06/2022   PLT 137 (L) 09/06/2022      Component Value Date/Time   NA 135 09/06/2022 1139   NA 139 08/15/2021 1455   K 3.8 09/06/2022 1139   CL 101 09/06/2022 1139   CO2 22 09/06/2022 1139   GLUCOSE 92 09/06/2022 1139   BUN 28 (H) 09/06/2022 1139   BUN 24 08/15/2021 1455   CREATININE 0.91 09/06/2022 1139   CALCIUM 9.4 09/06/2022 1139   PROT 7.5 10/29/2021 1304   PROT 6.7 09/07/2019 0931   ALBUMIN 4.4 10/29/2021 1304   ALBUMIN 4.0 09/07/2019 0931   AST 39 10/29/2021 1304   ALT 29 10/29/2021 1304   ALKPHOS 55 10/29/2021 1304   BILITOT 1.2 10/29/2021 1304   BILITOT 0.4 09/07/2019 0931   GFRNONAA >60 09/06/2022 1139   GFRAA 99 02/22/2020 1527   Lab Results  Component Value Date   HGBA1C 5.7 (H) 07/19/2019   No results found for: "VITAMINB12" Lab Results  Component Value Date   TSH 0.618 09/06/2022       ASSESSMENT AND PLAN  Transient neurologic deficit - Plan: EEG adult  Aphasia  At risk for central sleep apnea - Plan: PSG SLEEP STUDY  OSA (obstructive sleep apnea) - Plan: PSG SLEEP STUDY  Excessive daytime sleepiness - Plan: PSG SLEEP STUDY   He has had 3 episodes of aphasia, each lasting about 5 to 8 minutes.  This could be transient ischemic attack or seizure  related.  Carotid Doppler showed moderate plaque in the left carotid bulb that did not appear to be hemodynamically significant stenosis.  He is on Eliquis due to his cardiac issues including A-fib.  He is advised to continue the Eliquis. We will check an EEG to determine if there is any epileptiform activity. PSG for witnessed apnea.  Due to his cardiac issues would recommend an in lab study rather than a home sleep study. Stay active and exercise as tolerated. 5.   Cognitive issues appear stable.   He will return to see [place] in 4 months or sooner if there are new or worsening neurologic symptoms.   43-minute office visit with the majority of the time spent face-to-face for history and physical, discussion/counseling and decision-making.  Additional time with record review and documentation.   This visit is part of a comprehensive longitudinal care medical relationship regarding the patients primary diagnosis of memory loss and related concerns.  Tylek Boney A. Epimenio Foot, MD, [facility] 11/27/2022, 7:20 PM Certified in Neurology, Clinical Neurophysiology, Sleep Medicine and Neuroimaging  [facility] [address] [place], [place] [address] [phone]

## 2022-11-28 ENCOUNTER — Telehealth: Payer: Self-pay | Admitting: Neurology

## 2022-11-28 NOTE — Telephone Encounter (Signed)
Patient left a voicemail on my phone stating he needs to reschedule his EEG appointment. Can you reschedule the patient. Thank you.

## 2022-11-28 NOTE — Telephone Encounter (Signed)
Pt rescheduled for 12/17/22 at 9:30am

## 2022-11-28 NOTE — Progress Notes (Signed)
  Electrophysiology Office Note:   ID:  Jerry Foster, Jerry Foster 07-29-1950, MRN [id number]  Primary Cardiologist: Tonny Bollman, MD Electrophysiologist: Lanier Prude, MD      History of Present Illness:   Jerry Foster is a 72 y.o. male with h/o RBBB, AS s/p AVR, tachy brady syndrome, persistent AF, advanced AV block, and atrial flutter seen today for routine electrophysiology followup.   Last seen by EP 11/01/2020. Has followed up with AF clinic in the interim.   Since last being seen in our clinic the patient reports doing very well.  he denies chest pain, palpitations, dyspnea, PND, orthopnea, nausea, vomiting, dizziness, syncope, edema, weight gain, or early satiety.   Review of systems complete and found to be negative unless listed in HPI.   EP Information / Studies Reviewed:    EKG is not ordered today. EKG from 11/07/2022 reviewed which showed NSR at 68 bpm with intermittent A pacing following a PAC.    PPM Interrogation-  reviewed in detail today,  See PACEART report.  Device History: Abbott Dual Chamber PPM implanted 07/2019 for Tachy-Brady syndrome  Arrhythmia History  S/p Ablation 02/2020 by Dr. Johney Frame  Physical Exam:   VS:  BP 126/72   Pulse 68   Ht 5\' 10"  (1.778 m)   Wt 166 lb 3.2 oz (75.4 kg)   SpO2 92%   BMI 23.85 kg/m    Wt Readings from Last 3 Encounters:  11/29/22 166 lb 3.2 oz (75.4 kg)  11/27/22 163 lb (73.9 kg)  11/13/22 161 lb (73 kg)     GEN: Well nourished, well developed in no acute distress NECK: No JVD; No carotid bruits CARDIAC: Regular rate and rhythm with occasional ectopy, no murmurs, rubs, gallops RESPIRATORY:  Clear to auscultation without rales, wheezing or rhonchi  ABDOMEN: Soft, non-tender, non-distended EXTREMITIES:  No edema; No deformity   ASSESSMENT AND PLAN:    Tachy-Brady syndrome s/p Abbott PPM  Transient CHB Normal PPM function See Pace Art report No changes today Device is MRI compatible and parameters are  appropriate  Paroxysmal Atrial Fibrillation and Atrial Flutter  S/p Ablation 02/2020 Burden <1% by device, though long follow up.  Continue Eliquis 5mg  BID for CHA2DS2VASC of at least 5  CAD  Denies s/s ischemia  HTN Stable on current regimen         Disposition:   Follow up with Dr. Lalla Brothers in 6 months  Signed, Graciella Freer, PA-C

## 2022-11-29 ENCOUNTER — Encounter: Payer: Self-pay | Admitting: Student

## 2022-11-29 ENCOUNTER — Ambulatory Visit: Payer: Medicare Other | Attending: Student | Admitting: Student

## 2022-11-29 VITALS — BP 126/72 | HR 68 | Ht 70.0 in | Wt 166.2 lb

## 2022-11-29 DIAGNOSIS — I10 Essential (primary) hypertension: Secondary | ICD-10-CM

## 2022-11-29 DIAGNOSIS — I350 Nonrheumatic aortic (valve) stenosis: Secondary | ICD-10-CM

## 2022-11-29 DIAGNOSIS — I4819 Other persistent atrial fibrillation: Secondary | ICD-10-CM

## 2022-11-29 DIAGNOSIS — I442 Atrioventricular block, complete: Secondary | ICD-10-CM | POA: Diagnosis not present

## 2022-11-29 LAB — CUP PACEART INCLINIC DEVICE CHECK
Battery Remaining Longevity: 99 mo
Battery Voltage: 3.01 V
Brady Statistic RA Percent Paced: 15 %
Brady Statistic RV Percent Paced: 0.42 %
Date Time Interrogation Session: 20240906092451
Implantable Lead Connection Status: 753985
Implantable Lead Connection Status: 753985
Implantable Lead Implant Date: 20210506
Implantable Lead Implant Date: 20210506
Implantable Lead Location: 753859
Implantable Lead Location: 753860
Implantable Pulse Generator Implant Date: 20210506
Lead Channel Impedance Value: 425 Ohm
Lead Channel Impedance Value: 462.5 Ohm
Lead Channel Pacing Threshold Amplitude: 0.625 V
Lead Channel Pacing Threshold Amplitude: 1 V
Lead Channel Pacing Threshold Pulse Width: 0.4 ms
Lead Channel Pacing Threshold Pulse Width: 0.4 ms
Lead Channel Sensing Intrinsic Amplitude: 3.3 mV
Lead Channel Sensing Intrinsic Amplitude: 5.5 mV
Lead Channel Setting Pacing Amplitude: 1.25 V
Lead Channel Setting Pacing Amplitude: 1.625
Lead Channel Setting Pacing Pulse Width: 0.4 ms
Lead Channel Setting Sensing Sensitivity: 0.7 mV
Pulse Gen Model: 2272
Pulse Gen Serial Number: 3825759

## 2022-11-29 NOTE — Patient Instructions (Signed)
Medication Instructions:  Your physician recommends that you continue on your current medications as directed. Please refer to the Current Medication list given to you today.  *If you need a refill on your cardiac medications before your next appointment, please call your pharmacy*  Lab Work: None ordered If you have labs (blood work) drawn today and your tests are completely normal, you will receive your results only by: MyChart Message (if you have MyChart) OR A paper copy in the mail If you have any lab test that is abnormal or we need to change your treatment, we will call you to review the results.  Follow-Up: At [facility], you and your health needs are our priority.  As part of our continuing mission to provide you with exceptional heart care, we have created designated Provider Care Teams.  These Care Teams include your primary Cardiologist (physician) and Advanced Practice Providers (APPs -  Physician Assistants and Nurse Practitioners) who all work together to provide you with the care you need, when you need it.  Your next appointment:   5 month(s)  Provider:   Steffanie Dunn, MD

## 2022-12-10 ENCOUNTER — Other Ambulatory Visit: Payer: Medicare Other | Admitting: *Deleted

## 2022-12-10 DIAGNOSIS — G459 Transient cerebral ischemic attack, unspecified: Secondary | ICD-10-CM | POA: Insufficient documentation

## 2022-12-17 ENCOUNTER — Ambulatory Visit: Payer: Medicare Other | Admitting: Neurology

## 2022-12-17 DIAGNOSIS — R4182 Altered mental status, unspecified: Secondary | ICD-10-CM

## 2022-12-17 DIAGNOSIS — R29818 Other symptoms and signs involving the nervous system: Secondary | ICD-10-CM

## 2022-12-19 ENCOUNTER — Encounter: Payer: Self-pay | Admitting: Neurology

## 2022-12-19 NOTE — Progress Notes (Signed)
[facility]  EEG (ELECTROENCEPHALOGRAM) REPORT   STUDY DATE: 12/17/2018 PATIENT NAME: Jerry Foster DOB: [birth year] MRN: [id number]  ORDERING CLINICIAN: Daysia Vandenboom A. Epimenio Foot, MD. PhD  TECHNOLOGIST: Marianne Sofia, REEGT TECHNIQUE: Electroencephalogram was recorded utilizing standard 10-20 system of lead placement and reformatted into average and bipolar montages.  RECORDING TIME: 25 minutes 15 seconds  CLINICAL INFORMATION: 72 year old man with transient neurologic deficits memory loss  FINDINGS: A digital EEG was performed while the patient was awake and drowsy. While awake and most alert there was increased posterior beta activity.  There was intermittent theta activity, mostly symmetric.  There were no focal, lateralizing, epileptiform activity or seizures seen.  Photic stimulation had a normal driving response. Hyperventilation and recovery did not change the underlying rhythms. EKG channel shows normal sinus rhythm.  The patient became drowsy and sleep was recorded.  IMPRESSION: This EEG while the patient was awake and asleep shows the following: Increased posterior beta activity could be due to medication. Few episodes of intermittent theta activity without definite lateralization could represent drowsiness. No epileptiform activity was noted.   INTERPRETING PHYSICIAN:   Lovelle Deitrick A. Epimenio Foot, MD, PhD, [facility] Certified in Neurology, Clinical Neurophysiology, Sleep Medicine, Pain Medicine and Neuroimaging  [facility] [address] [place], [place] [address] [phone]

## 2022-12-23 ENCOUNTER — Telehealth: Payer: Self-pay | Admitting: Neurology

## 2022-12-23 NOTE — Telephone Encounter (Signed)
NPSG- BCBS medicare no auth req  [employer].

## 2023-01-31 ENCOUNTER — Ambulatory Visit (INDEPENDENT_AMBULATORY_CARE_PROVIDER_SITE_OTHER): Payer: Medicare Other

## 2023-01-31 DIAGNOSIS — I442 Atrioventricular block, complete: Secondary | ICD-10-CM

## 2023-02-06 LAB — CUP PACEART REMOTE DEVICE CHECK
Battery Remaining Longevity: 93 mo
Battery Remaining Percentage: 71 %
Battery Voltage: 3.01 V
Brady Statistic AP VP Percent: 1 %
Brady Statistic AP VS Percent: 29 %
Brady Statistic AS VP Percent: 1 %
Brady Statistic AS VS Percent: 70 %
Brady Statistic RA Percent Paced: 26 %
Brady Statistic RV Percent Paced: 1 %
Date Time Interrogation Session: 20241113002742
Implantable Lead Connection Status: 753985
Implantable Lead Connection Status: 753985
Implantable Lead Implant Date: 20210506
Implantable Lead Implant Date: 20210506
Implantable Lead Location: 753859
Implantable Lead Location: 753860
Implantable Pulse Generator Implant Date: 20210506
Lead Channel Impedance Value: 430 Ohm
Lead Channel Impedance Value: 460 Ohm
Lead Channel Pacing Threshold Amplitude: 0.5 V
Lead Channel Pacing Threshold Amplitude: 1.125 V
Lead Channel Pacing Threshold Pulse Width: 0.4 ms
Lead Channel Pacing Threshold Pulse Width: 0.4 ms
Lead Channel Sensing Intrinsic Amplitude: 3.4 mV
Lead Channel Sensing Intrinsic Amplitude: 3.9 mV
Lead Channel Setting Pacing Amplitude: 1.375
Lead Channel Setting Pacing Amplitude: 1.5 V
Lead Channel Setting Pacing Pulse Width: 0.4 ms
Lead Channel Setting Sensing Sensitivity: 0.7 mV
Pulse Gen Model: 2272
Pulse Gen Serial Number: 3825759

## 2023-02-11 NOTE — Progress Notes (Signed)
Remote pacemaker transmission.   

## 2023-02-15 ENCOUNTER — Other Ambulatory Visit: Payer: Self-pay | Admitting: Cardiovascular Disease

## 2023-02-25 ENCOUNTER — Ambulatory Visit: Payer: Medicare Other | Admitting: Neurology

## 2023-02-25 DIAGNOSIS — G4733 Obstructive sleep apnea (adult) (pediatric): Secondary | ICD-10-CM | POA: Diagnosis not present

## 2023-02-25 DIAGNOSIS — Z9189 Other specified personal risk factors, not elsewhere classified: Secondary | ICD-10-CM

## 2023-02-25 DIAGNOSIS — G4719 Other hypersomnia: Secondary | ICD-10-CM

## 2023-02-27 NOTE — Progress Notes (Signed)
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   Reduced amount of N3 sleep Recommendation Auto-PAP 8 - 15 cm H2O pressure with heated humidifier Download in 30-90 days Follow up with Dr. Despina Arias A. Epimenio Foot, MD, PhD, FAAN Certified in Neurology, Clinical Neurophysiology, Sleep Medicine  Director, [facility]         Comments   Patient arrived for a diagnostic polysomnogram. Procedure explained and all questions answered. Standard paste setup without complications. Patient slept supine, right, and left. Mild to moderate snoring heard. Severe respiratory events observed. After 2 hours total sleep time, AHI = 40.5. CPAP was started at 5cm with a large Vitera full face mask, as he primarily breathed thru his mouth during the diagnostic portion. CPAP pressure was increased to 13 cm/H2O in an effort to control obstructive events and abolish snoring. No significant cardiac arrhythmias observed. Patient does have a known cardiac history. No significant PLMS observed. One restroom visit.    Lights out: 09:58:41 PM Lights on: 05:28:07 AM   Time Total Supine Side Prone Upright  Recording (TRT) 7h 29.20m 3h 55.13m 3h 34.33m 0h 0.52m 0h 0.70m  Sleep  (TST) 6h 13.69m 2h 49.54m 3h 23.73m 0h 0.26m 0h 0.51m   Latency N1 N2 N3 REM Onset Per. Slp. Eff.  Actual 0h 22.8m 0h 0.33m 1h 11.37m 3h 43.28m 0h 1.59m 0h 30.16m 82.98%   Stg Dur Wake N1 N2 N3 REM  Total 76.5 24.0 252.0 16.0 81.0  Supine 65.5 18.5 138.0 13.0 0.0  Side 11.0 5.5 114.0 3.0 81.0  Prone 0.0 0.0 0.0 0.0 0.0  Upright 0.0 0.0 0.0 0.0 0.0   Stg % Wake N1 N2 N3 REM  Total 17.0 6.4 67.6 4.3 21.7  Supine 14.6 5.0 37.0 3.5 0.0  Side 2.4 1.5 30.6 0.8 21.7  Prone 0.0 0.0 0.0 0.0 0.0  Upright 0.0 0.0 0.0 0.0 0.0     Apnea Summary Sub Supine Side Prone Upright  Total 101 Total 101 99 2 0 0    REM 1 0 1 0 0    NREM 100 99 1 0 0  Obs 92 REM 0 0 0 0 0    NREM 92 92 0 0 0  Mix 2 REM 0 0 0 0 0    NREM 2 1 1  0 0  Cen 7 REM 1 0 1 0 0    NREM 6 6 0 0 0   Rera Summary Sub Supine Side Prone Upright  Total 0 Total 0 0 0 0 0    REM 0 0 0 0 0    NREM 0 0 0 0 0   Hypopnea Summary Sub Supine Side Prone Upright  Total 29 Total 29 29 0 0 0    REM 0 0 0  0 0    NREM 29 29 0 0 0   4% Hypopnea Summary Sub Supine Side Prone Upright  Total (4%) 17 Total 17 17 0 0 0    REM 0 0 0 0 0    NREM 17 17 0 0 0     AHI Total Obs Mix Cen  20.91 Apnea 16.25 14.80 0.32 1.13   Hypopnea 4.66 -- -- --  18.98 Hypopnea (4%) 2.73 -- -- --    Total Supine Side Prone Upright  Position AHI 20.91 45.31 0.59 0.00 0.00  REM AHI 0.74   NREM AHI 26.51   Position RDI 20.91 45.31 0.59 0.00 0.00  REM RDI 0.74   NREM RDI 26.51    4% Hypopnea Total Supine Side Prone Upright  Position AHI (4%) 18.98 41.06 0.59 0.00 0.00  REM AHI (4%) 0.74   NREM AHI (4%) 24.04   Position RDI (4%) 18.98 41.06 0.59 0.00 0.00  REM RDI (4%) 0.74   NREM RDI (4%) 24.04    Desaturation Information Threshold: 2% <100% <90% <80% <70% <60% <50% <40%  Supine 160.0 36.0 0.0 0.0 0.0 0.0 0.0  Side 20.0 0.0 0.0 0.0 0.0 0.0 0.0  Prone 0.0 0.0 0.0 0.0 0.0 0.0 0.0  Upright 0.0 0.0 0.0 0.0 0.0 0.0 0.0  Total 180.0 36.0 0.0 0.0 0.0 0.0 0.0   Index 24.1 4.8 0.0 0.0 0.0 0.0 0.0   Threshold: 3% <100% <90% <80% <70% <60% <50% <40%  Supine 124.0 35.0 0.0 0.0 0.0 0.0 0.0  Side 4.0 0.0 0.0 0.0 0.0 0.0 0.0  Prone 0.0 0.0 0.0 0.0 0.0 0.0 0.0  Upright 0.0 0.0 0.0 0.0 0.0 0.0 0.0  Total 128.0 35.0 0.0 0.0 0.0 0.0 0.0  Index 17.1 4.7 0.0 0.0 0.0 0.0 0.0   Threshold: 4% <100% <90% <80% <70% <60% <50% <40%  Supine 95.0 34.0 0.0 0.0 0.0 0.0 0.0  Side 3.0 0.0 0.0 0.0 0.0 0.0 0.0  Prone 0.0 0.0 0.0 0.0 0.0 0.0 0.0  Upright 0.0 0.0 0.0 0.0 0.0 0.0 0.0  Total 98.0 34.0 0.0 0.0 0.0 0.0 0.0  Index 13.1 4.6 0.0 0.0 0.0 0.0 0.0   Threshold: 4% <100% <90% <80% <70% <60% <50% <40%  Supine 95 34 0 0 0 0 0  Side 3 0 0 0 0 0 0  Prone 0 0 0 0 0 0 0  Upright 0 0 0 0 0 0 0  Total 98 34 0 0 0 0 0   Awakening/Arousal Information # of Awakenings 68  Wake after sleep onset 75.38m  Wake after persistent sleep 68.43m   Arousal Assoc. Arousals Index  Apneas 75 12.1  Hypopneas 17 2.7  Leg Movements 1 0.2  Snore 0 0.0  PTT Arousals 0 0.0  Spontaneous 48 7.7  Total 141 22.7  Leg Movement Information PLMS LMs Index  Total LMs during PLMS 4 0.6  LMs w/ Microarousals 0 0.0   LM LMs Index  w/ Microarousal 1 0.2  w/ Awakening 1 0.2  w/ Resp Event 0 0.0  Spontaneous 13 2.1  Total 14 2.3     Desaturation threshold setting: 4% Minimum desaturation setting: 10 seconds SaO2 nadir: 81% The longest event was a 72 sec obstructive Apnea with a minimum SaO2 of 82%. The lowest SaO2 was 81% associated with a 28 sec obstructive Apnea. EKG Rates EKG Avg Max Min  Awake 64 80 58  Asleep 62 77 54  EKG Events: N/A

## 2023-03-03 ENCOUNTER — Encounter: Payer: Self-pay | Admitting: *Deleted

## 2023-03-03 DIAGNOSIS — G4733 Obstructive sleep apnea (adult) (pediatric): Secondary | ICD-10-CM

## 2023-03-11 DIAGNOSIS — G4733 Obstructive sleep apnea (adult) (pediatric): Secondary | ICD-10-CM | POA: Insufficient documentation

## 2023-03-14 ENCOUNTER — Encounter (HOSPITAL_COMMUNITY): Payer: Self-pay

## 2023-03-14 ENCOUNTER — Emergency Department (HOSPITAL_COMMUNITY): Payer: Medicare Other

## 2023-03-14 ENCOUNTER — Emergency Department (HOSPITAL_COMMUNITY)
Admission: EM | Admit: 2023-03-14 | Discharge: 2023-03-14 | Disposition: A | Discharge status: Home / Self Care | Payer: Medicare Other | Attending: Emergency Medicine | Admitting: Emergency Medicine

## 2023-03-14 ENCOUNTER — Other Ambulatory Visit: Payer: Self-pay

## 2023-03-14 ENCOUNTER — Telehealth: Payer: Self-pay | Admitting: Neurology

## 2023-03-14 DIAGNOSIS — I509 Heart failure, unspecified: Secondary | ICD-10-CM | POA: Insufficient documentation

## 2023-03-14 DIAGNOSIS — R410 Disorientation, unspecified: Secondary | ICD-10-CM | POA: Insufficient documentation

## 2023-03-14 DIAGNOSIS — R569 Unspecified convulsions: Secondary | ICD-10-CM | POA: Diagnosis present

## 2023-03-14 LAB — COMPREHENSIVE METABOLIC PANEL
ALT: 29 U/L (ref 0–44)
AST: 35 U/L (ref 15–41)
Albumin: 3.4 g/dL — ABNORMAL LOW (ref 3.5–5.0)
Alkaline Phosphatase: 50 U/L (ref 38–126)
Anion gap: 11 (ref 5–15)
BUN: 24 mg/dL — ABNORMAL HIGH (ref 8–23)
CO2: 22 mmol/L (ref 22–32)
Calcium: 9.7 mg/dL (ref 8.9–10.3)
Chloride: 103 mmol/L (ref 98–111)
Creatinine, Ser: 1.05 mg/dL (ref 0.61–1.24)
GFR, Estimated: >60 mL/min (ref >60)
Glucose, Bld: 145 mg/dL — ABNORMAL HIGH (ref 70–99)
Potassium: 3.2 mmol/L — ABNORMAL LOW (ref 3.5–5.1)
Sodium: 136 mmol/L (ref 135–145)
Total Bilirubin: 0.6 mg/dL (ref <1.2)
Total Protein: 6.4 g/dL — ABNORMAL LOW (ref 6.5–8.1)

## 2023-03-14 LAB — URINALYSIS, ROUTINE W REFLEX MICROSCOPIC
Bilirubin Urine: NEGATIVE
Glucose, UA: NEGATIVE mg/dL
Hgb urine dipstick: NEGATIVE
Ketones, ur: NEGATIVE mg/dL
Leukocytes,Ua: NEGATIVE
Nitrite: NEGATIVE
Protein, ur: NEGATIVE mg/dL
Specific Gravity, Urine: 1.016 (ref 1.005–1.030)
pH: 5 (ref 5.0–8.0)

## 2023-03-14 LAB — CBC WITH DIFFERENTIAL/PLATELET
Abs Immature Granulocytes: 0.04 10*3/uL (ref 0.00–0.07)
Basophils Absolute: 0 10*3/uL (ref 0.0–0.1)
Basophils Relative: 0 %
Eosinophils Absolute: 0.1 10*3/uL (ref 0.0–0.5)
Eosinophils Relative: 1 %
HCT: 43.6 % (ref 39.0–52.0)
Hemoglobin: 14.9 g/dL (ref 13.0–17.0)
Immature Granulocytes: 0 %
Lymphocytes Relative: 17 %
Lymphs Abs: 1.6 10*3/uL (ref 0.7–4.0)
MCH: 30.9 pg (ref 26.0–34.0)
MCHC: 34.2 g/dL (ref 30.0–36.0)
MCV: 90.5 fL (ref 80.0–100.0)
Monocytes Absolute: 0.5 10*3/uL (ref 0.1–1.0)
Monocytes Relative: 5 %
Neutro Abs: 7.3 10*3/uL (ref 1.7–7.7)
Neutrophils Relative %: 77 %
Platelets: 143 10*3/uL — ABNORMAL LOW (ref 150–400)
RBC: 4.82 MIL/uL (ref 4.22–5.81)
RDW: 12.8 % (ref 11.5–15.5)
WBC: 9.5 10*3/uL (ref 4.0–10.5)
nRBC: 0 % (ref 0.0–0.2)

## 2023-03-14 LAB — CBG MONITORING, ED: Glucose-Capillary: 133 mg/dL — ABNORMAL HIGH (ref 70–99)

## 2023-03-14 LAB — LIPASE, BLOOD: Lipase: 164 U/L — ABNORMAL HIGH (ref 11–51)

## 2023-03-14 LAB — RAPID URINE DRUG SCREEN, HOSP PERFORMED
Amphetamines: NOT DETECTED
Barbiturates: NOT DETECTED
Benzodiazepines: NOT DETECTED
Cocaine: NOT DETECTED
Opiates: NOT DETECTED
Tetrahydrocannabinol: NOT DETECTED

## 2023-03-14 LAB — ETHANOL: Alcohol, Ethyl (B): <10 mg/dL (ref <10)

## 2023-03-14 LAB — TROPONIN I (HIGH SENSITIVITY): Troponin I (High Sensitivity): 19 ng/L — ABNORMAL HIGH (ref <18)

## 2023-03-14 MED ORDER — GADOBUTROL 1 MMOL/ML IV SOLN
8.0000 mL | Freq: Once | INTRAVENOUS | Status: AC | PRN
  Administered 2023-03-14: 8 mL via INTRAVENOUS

## 2023-03-14 MED ORDER — SODIUM CHLORIDE 0.9 % IV BOLUS
500.0000 mL | Freq: Once | INTRAVENOUS | Status: AC
  Administered 2023-03-14: 500 mL via INTRAVENOUS

## 2023-03-14 MED ORDER — SODIUM CHLORIDE 0.9 % IV SOLN
20.0000 mg/kg | Freq: Once | INTRAVENOUS | Status: AC
  Administered 2023-03-14: 1500 mg via INTRAVENOUS
  Filled 2023-03-14: qty 15

## 2023-03-14 MED ORDER — LEVETIRACETAM 500 MG PO TABS: 500.0000 mg | ORAL_TABLET | Freq: Two times a day (BID) | ORAL | 0 refills | Status: DC

## 2023-03-14 NOTE — ED Provider Notes (Signed)
Patient initially seen by Dr. Jacqulyn Bath.  Please see his note.  Plan was to follow-up on patient's MRI.  The MRI does not show any acute abnormality.  Plan is for discharge home with Keppra RX.  Outpatient neurology follow up   Linwood Dibbles, MD 03/17/23 [phone]

## 2023-03-14 NOTE — ED Notes (Signed)
Patient transported to CT 

## 2023-03-14 NOTE — ED Notes (Signed)
Pt currently in MRI

## 2023-03-14 NOTE — ED Triage Notes (Signed)
Arrives [facility] after pt significant other made notification with 911.   Reported he woke up and noticed pt grunting and displayed tonic - clonic shaking ~5-7 minutes. Initially unresponsive, and had bit his tongue.   This was the second episode of seizure like activity tonight. No history of seizures.   Dementia.

## 2023-03-14 NOTE — Telephone Encounter (Signed)
Pt's brother called wanting to know if a copy of the report for the sleep study can be emailed to him @ [email or web address]. Please advise.

## 2023-03-14 NOTE — ED Notes (Signed)
CCMD called to place patient on monitor

## 2023-03-14 NOTE — Discharge Instructions (Signed)
Take the new prescription for seizures as prescribed.  Your MRI today did not show any acute abnormality.  Call your neurologist's office to arrange a follow up appointment regarding your seizure.

## 2023-03-14 NOTE — ED Provider Notes (Signed)
Emergency Department Provider Note   I have reviewed the triage vital signs and the nursing notes.   HISTORY  Chief Complaint Seizures   HPI Jerry Foster is a 72 y.o. male past history reviewed including dementia presents to the emergency department with seizure-like activity.  Patient follows with [facility] neurology due to cognitive decline and actually underwent EEG as a part of that workup in September with no abnormal findings.  He is not on antiepileptic medications.  The patient significant other provides most of the history and witnessed the event tonight.  He states that around 3 AM the patient seemed to be grunting, became stiff, appeared to be struggling with breathing.  This went on for approximately 5 minutes.  Patient then had snoring respirations.  He seemed to become more awake but still much more confused than normal.  He then proceeded to have a second episode approximately 30 minutes after the first.  He did not fairly return to his baseline mental status between episodes.   Patient is postictal and does not recall the events of this evening.  Denies pain currently. 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   21 mm Edwards Inspiris Resilia stented bovine pericardial tissue valve via right mini thoracotomy approach   Sensorineural hearing loss of both ears    Snoring 03/20/2021   [facility] Feb 2024: normal   Thrombosed hemorrhoids    hsitory of   Tubular adenoma of  colon    Vitamin D deficiency     Review of Systems  Level 5 caveat: AMS  ____________________________________________   PHYSICAL EXAM:  VITAL SIGNS: ED Triage Vitals  Encounter Vitals Group     BP 03/14/23 0518 (!) 90/50     Pulse Rate 03/14/23 0520 60     Resp 03/14/23 0520 11     Temp 03/14/23 0520 98 F (36.7 C)     Temp Source 03/14/23 0520 Oral     SpO2 03/14/23 0518 95 %     Weight 03/14/23 0519 165 lb (74.8 kg)     Height 03/14/23 0519 5\' 10"  (1.778 m)   Constitutional: Alert but confused. Well appearing and in no acute distress. Eyes: Conjunctivae are normal. PERRL. Head: Atraumatic. Nose: No congestion/rhinnorhea. Mouth/Throat: Mucous membranes are moist. Abrasion to the tip of the tongue without deeper laceration.  Neck: No stridor.   Cardiovascular: Normal rate, regular rhythm. Good peripheral circulation. Grossly normal heart sounds.   Respiratory: Normal respiratory effort.  No retractions. Lungs CTAB. Gastrointestinal: Soft and nontender. No distention.  Musculoskeletal: No gross deformities of extremities. Normal ROM of the bilateral shoulders and hips.  Neurologic:  Normal speech and language. No gross focal neurologic deficits are appreciated.  Skin:  Skin is warm, dry and intact. No rash noted.  ____________________________________________   LABS (all labs ordered are listed, but only abnormal results are displayed)  Labs  Reviewed  COMPREHENSIVE METABOLIC PANEL - Abnormal; Notable for the following components:      Result Value   Potassium 3.2 (*)    Glucose, Bld 145 (*)    BUN 24 (*)    Total Protein 6.4 (*)    Albumin 3.4 (*)    All other components within normal limits  LIPASE, BLOOD - Abnormal; Notable for the following components:   Lipase 164 (*)    All other components within normal limits  CBC WITH DIFFERENTIAL/PLATELET - Abnormal; Notable for the following components:   Platelets 143 (*)    All other components within normal limits   CBG MONITORING, ED - Abnormal; Notable for the following components:   Glucose-Capillary 133 (*)    All other components within normal limits  TROPONIN I (HIGH SENSITIVITY) - Abnormal; Notable for the following components:   Troponin I (High Sensitivity) 19 (*)    All other components within normal limits  ETHANOL  RAPID URINE DRUG SCREEN, HOSP PERFORMED  URINALYSIS, ROUTINE W REFLEX MICROSCOPIC   ____________________________________________  EKG   EKG Interpretation Date/Time:  Friday March 14 2023 05:19:58 EST Ventricular Rate:  60 PR Interval:  135 QRS Duration:  165 QT Interval:  466 QTC Calculation: 466 R Axis:   72  Text Interpretation: Sinus rhythm Right bundle branch block Confirmed by Alona Bene [phone]) on 03/14/2023 5:28:09 AM       ____________________________________________   PROCEDURES  Procedure(s) performed:   Procedures  None  ____________________________________________   INITIAL IMPRESSION / ASSESSMENT AND PLAN / ED COURSE  Pertinent labs & imaging results that were available during my care of the patient were reviewed by me and considered in my medical decision making (see chart for details).   This patient is Presenting for Evaluation of AMS, which does require a range of treatment options, and is a complaint that involves a high risk of morbidity and mortality.  The Differential Diagnoses includes but is not exclusive to alcohol, illicit or prescription medications, intracranial pathology such as stroke, intracerebral hemorrhage, fever or infectious causes including sepsis, hypoxemia, uremia, trauma, endocrine related disorders such as diabetes, hypoglycemia, thyroid-related diseases, etc.   Critical Interventions-    Medications  sodium chloride 0.9 % bolus 500 mL (0 mLs Intravenous Stopped 03/14/23 0657)  levETIRAcetam (KEPPRA) 1,500 mg in sodium chloride 0.9 % 100 mL IVPB (0 mg Intravenous Stopped 03/14/23 1009)  gadobutrol  (GADAVIST) 1 MMOL/ML injection 8 mL (8 mLs Intravenous Contrast Given 03/14/23 1431)    Reassessment after intervention:     I did obtain Additional Historical Information from [facility] by phone.   I decided to review pertinent External Data, and in summary patient following with [facility] for what appears to be mainly cognitive decline. EEG from September of this year was normal. No AEDs.   Clinical Laboratory Tests Ordered, included CBG 133. Lipase negative. CMP without AKI.   Radiologic Tests Ordered, included CT head. I independently interpreted the images and agree with radiology interpretation.   Cardiac Monitor Tracing which shows NSR.    Social Determinants of Health Risk patient lives at home.   Medical Decision Making: Summary:  Patient presents emergency department with 2 episodes of seizure-like activity.  No witnessed seizure activity in the ED.  Patient afebrile.  He does appear to be postictal here with abrasion to the tip of the tongue.  History and exam seem consistent with generalized tonic-clonic seizure.   Reevaluation with update and discussion with patient. Discussed case with Dr.  Selina Cooley who has made recommendations regarding medication (Keppra). Will follow MRI and likely discharge.   Considered admission but workup is progress. Care transferred to Dr. Lynelle Doctor.   Patient's presentation is most consistent with acute presentation with potential threat to life or bodily function.   Disposition: pending.   ____________________________________________  FINAL CLINICAL IMPRESSION(S) / ED DIAGNOSES  Final diagnoses:  Seizure (HCC)     NEW OUTPATIENT MEDICATIONS STARTED DURING THIS VISIT:  Discharge Medication List as of 03/14/2023  3:29 PM     START taking these medications   Details  levETIRAcetam (KEPPRA) 500 MG tablet Take 1 tablet (500 mg total) by mouth 2 (two) times daily., Starting Fri 03/14/2023, Until Sun 04/13/2023, Normal        Note:  This document was  prepared using Dragon voice recognition software and may include unintentional dictation errors.  Alona Bene, MD, [facility] Emergency Medicine    Aideen Fenster, Arlyss Repress, MD 03/21/23 [phone]

## 2023-03-17 ENCOUNTER — Telehealth: Payer: Self-pay | Admitting: Neurology

## 2023-03-17 NOTE — Telephone Encounter (Signed)
Patient had left a VM for the referral department wanting to schedule a follow up with Dr. Epimenio Foot from his recent hospitalization on Friday-was seen in the ED for a seizure. The soonest availability right now appears to be April-should patient be seen sooner? Please advise, thank you.

## 2023-03-17 NOTE — Telephone Encounter (Signed)
Called pt back and got him scheduled for next Monday 03/24/2023 @ 3pm.

## 2023-03-24 ENCOUNTER — Ambulatory Visit: Payer: Medicare Other | Admitting: Neurology

## 2023-03-24 ENCOUNTER — Encounter: Payer: Self-pay | Admitting: Neurology

## 2023-03-24 VITALS — BP 108/69 | HR 61 | Ht 69.5 in | Wt 164.5 lb

## 2023-03-24 DIAGNOSIS — G309 Alzheimer's disease, unspecified: Secondary | ICD-10-CM

## 2023-03-24 DIAGNOSIS — I4819 Other persistent atrial fibrillation: Secondary | ICD-10-CM | POA: Diagnosis not present

## 2023-03-24 DIAGNOSIS — G4733 Obstructive sleep apnea (adult) (pediatric): Secondary | ICD-10-CM

## 2023-03-24 DIAGNOSIS — G3184 Mild cognitive impairment, so stated: Secondary | ICD-10-CM

## 2023-03-24 DIAGNOSIS — G40909 Epilepsy, unspecified, not intractable, without status epilepticus: Secondary | ICD-10-CM | POA: Diagnosis not present

## 2023-03-24 DIAGNOSIS — F0280 Dementia in other diseases classified elsewhere without behavioral disturbance: Secondary | ICD-10-CM

## 2023-03-24 NOTE — Progress Notes (Signed)
[facility]  PATIENT: Jerry Foster DOB: 05/01/1950  REFERRING DOCTOR OR PCP: Loyal Jacobson, MD SOURCE: Patient, notes from primary care, laboratory reports.  _________________________________   HISTORICAL  CHIEF COMPLAINT:  Chief Complaint  Patient presents with   Follow-up    Pt in room 10, Bill brother in room. Here for hospital follow up 03/14/23 for seizure. No recent seizures since hospital visit.     HISTORY OF PRESENT ILLNESS:  Joesph Foster, at [facility] for neurologic consultation regarding his memory loss and episodes of dizziness.  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  He feels he is stable since his last visit.  He continues to note difficulty with his memory.    He stopped driving due to getting lost near his home.  He is still reading and has memory of some of what he reads but not everything.  He needs to write things down more.  He has more trouble getting names out sometimes.   He always does better with hints.     He had a seizure a week ago.   He was asleep and boyfriend noted at 3 AM he started to have convulsions and then had gasping followed by 20-30 minutes of very loud snoring.   He was unable to be roused and then became conscious.  He had a second event an hour or two later and ambulance was called.   He bit his tongue.  He went to the ED.   Of note, this was the first night he tried CPAP (has done well since).  Imaging was non-contributary.    He was placed on Keppra and tolerates it well.    This was the third or fourth transent neurologic event of his life.   He had  previous events with decreased responsiveness and inability to speech but not LOC.     He has been on CPAP one week and is tolerating it well.  He has not noted feeling less sleepy or more alert during the day.        He was on Eliquis for a prosthetic heart valve and Atrial  Fibrillation.     He is already on pravastatin 40 mg for hyperlipidemia.   LDL was 117.  TChol was 189 most recently   EPWORTH SLEEPINESS SCALE  On a scale of 0 - 3 what is the chance of dozing:  Sitting and Reading:   2 Watching TV:    3 Sitting inactive in a public place: 1 Passenger in car for one hour: 3 Lying down to rest in the afternoon: 0 Sitting and talking to someone: 0 Sitting quietly after lunch:  1 In a car, stopped in traffic:  0  Total (out of 24):    10/24   mild EDS    Alzheimer's history: His amyloid 42/40 ratio was mildly reduced and [id number] was mildly increased on 02/18/2022.   The amyloid PET scan 01/25/2022 was equivocal but likely positive.      He scored 26/30 on the [facility] cognitive assessment today which is consistent with mild cognitive impairment.  He lost 3 points for delayed recall.  However, about 15 to 20 minutes later when I asked him about the 5 words, he got 3 correct without a prompt and an additional 1 with a category prompt..  Of note, he is educated (retired Education officer, community) so a score of 26 is certainly abnormal.       Earlier  this year, because he had snoring and mild excessive daytime sleepiness we ordered a home sleep study.  It did not show significant sleep apnea.  He has not noted more episodes of dizziness.  At MR angiogram was fine.  He had a AVR in 07/2019 followed by cardiac arrest and pacemaker placement. The surgery was performed with cardioplegia pump (time 159 minutes).  He developed AFib and had ablation January 2022 with good results.      His cardiologist is Dr. Excell Seltzer.     06/12/2022    3:10 PM 01/09/2022    9:48 AM 09/18/2021    9:28 AM 03/20/2021    8:35 AM  Montreal Cognitive Assessment   Visuospatial/ Executive (0/5) 5 5 5 4   Naming (0/3) 3 3 3 3   Attention: Read list of digits (0/2) 2 2 2 2   Attention: Read list of letters (0/1) 1 1 1 1   Attention: Serial 7 subtraction starting at 100 (0/3) 3 2 3 3   Language: Repeat  phrase (0/2) 2 2 2 2   Language : Fluency (0/1) 1 1 0 1  Abstraction (0/2) 2 2 2 2   Delayed Recall (0/5) 5 2 0 3  Orientation (0/6) 6 6 6 6   Total 30 26 24 27   Adjusted Score (based on education) 30  24      His mother and several aunts and maternal GM had Alzheimer.   His mother started having issues at age 104 while others were in 101's and 72's Is father lived to late 61's with no cognitive issues.     Data: I personally reviewed the CT scan from 07/28/2019.  It shows mild generalized cortical atrophy that is typical for age.  There do not appear to be any large vessel strokes or acute findings in the brain.  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  MRI of the brain 11/05/2021 shows mild generalized cortical atrophy, possibly a little more pronounced in the medial temporal lobes.  It is unchanged compared to the CT scan from 07/28/2019 and 04/12/2021.  TSH, B12, Vit D and HIV were all normal or negative between 06/2020 and 11/2020.      CT 11/13/2022: No acute findings.    Mild stable atrophy  Carotid 11/20/2022 1. Moderate amount of left-sided atherosclerotic plaque, not  resulting in a hemodynamically significant stenosis.  2. Unremarkable sonographic evaluation of the right carotid system.   ECHO 10/22/2022 1. 21 mm AoV bioprosthesis. MG up to 18 mmHG on this study, but remains within limits. The aortic valve has been repaired/ replaced. Aortic valve regurgitation is not visualized. There is a 21 mm bioprosthetic valve present in the aortic position. Procedure Date: 4/ 30/ 2021. Echo findings are consistent with normal structure and function of the aortic valve prosthesis. Aortic valve area, by VTI measures 1. 00 cm . Aortic valve mean gradient measures 18. 0 mmHg. Aortic valve Vmax measures 2. 91 m/ s. 2. Left ventricular ejection fraction, by estimation, is 70 to 75% . The left ventricle has hyperdynamic function. The left  ventricle has no regional wall motion abnormalities. There is mild concentric left ventricular hypertrophy. Left ventricular diastolic parameters are consistent with Grade I diastolic dysfunction ( impaired relaxation) . The average left ventricular global longitudinal strain is - 26. 1 % . The global longitudinal strain is normal. 3. Right ventricular systolic function is normal. The right ventricular size is normal. There is mildly elevated pulmonary  artery systolic pressure. The estimated right ventricular systolic pressure is 36. 9 mmHg. 4. The mitral valve is grossly normal. Mild mitral valve regurgitation. No evidence of mitral stenosis. 5. The inferior vena cava is normal in size with < 50% respiratory variability, suggesting right atrial pressure of 8 mmHg.   REVIEW OF SYSTEMS: Constitutional: No fevers, chills, sweats, or change in appetite Eyes: No visual changes, double vision, eye pain Ear, nose and throat: No hearing loss, ear pain, nasal congestion, sore throat Cardiovascular: No chest pain, palpitations Respiratory:  No shortness of breath at rest or with exertion.   No wheezes GastrointestinaI: No nausea, vomiting, diarrhea, abdominal pain, fecal incontinence Genitourinary:  No dysuria, urinary retention or frequency.  No nocturia. Musculoskeletal:  No neck pain, back pain Integumentary: No rash, pruritus, skin lesions Neurological: as above Psychiatric: No depression at this time.  No anxiety Endocrine: No palpitations, diaphoresis, change in appetite, change in weigh or increased thirst Hematologic/Lymphatic:  No anemia, purpura, petechiae. Allergic/Immunologic: No itchy/runny eyes, nasal congestion, recent allergic reactions, rashes  ALLERGIES: Allergies  Allergen Reactions   Lamisil [Terbinafine]     Elevated liver enzymes (LFTs)    HOME MEDICATIONS:  Current Outpatient Medications:    amLODipine (NORVASC) 10 MG tablet, TAKE 1 TABLET(10 MG) BY MOUTH DAILY, Disp: 90  tablet, Rfl: 3   aspirin EC 81 MG tablet, Take 81 mg by mouth every evening., Disp: , Rfl:    atorvastatin (LIPITOR) 80 MG tablet, Take 1 tablet by mouth at bedtime., Disp: , Rfl:    Calcium Citrate-Vitamin D (CITRACAL + D PO), Take 1 tablet by mouth in the morning and at bedtime., Disp: , Rfl:    chlorthalidone (HYGROTON) 25 MG tablet, TAKE 1/2 TABLET BY MOUTH DAILY, Disp: 45 tablet, Rfl: 2   Cholecalciferol (DIALYVITE VITAMIN D 5000) 125 MCG (5000 UT) capsule, Take 5,000 Units by mouth every evening., Disp: , Rfl:    donepezil (ARICEPT) 10 MG tablet, Take 1 tablet (10 mg total) by mouth at bedtime., Disp: 90 tablet, Rfl: 4   Doxepin HCl 3 MG TABS, Take 1 tablet by mouth at bedtime as needed (sleep)., Disp: , Rfl:    ELIQUIS 5 MG TABS tablet, TAKE 1 TABLET(5 MG) BY MOUTH TWICE DAILY, Disp: 60 tablet, Rfl: 5   levETIRAcetam (KEPPRA) 500 MG tablet, Take 1 tablet (500 mg total) by mouth 2 (two) times daily., Disp: 60 tablet, Rfl: 0   MELATONIN PO, Take by mouth at bedtime as needed (sleep)., Disp: , Rfl:    Omega-3 1000 MG CAPS, Take 1 capsule by mouth daily., Disp: , Rfl:    potassium chloride SA (KLOR-CON M) 20 MEQ tablet, Take 1 tablet (20 mEq total) by mouth 2 (two) times daily., Disp: 30 tablet, Rfl: 5   sertraline (ZOLOFT) 50 MG tablet, TAKE 1 TABLET(50 MG) BY MOUTH DAILY, Disp: 30 tablet, Rfl: 8   acetaminophen (TYLENOL) 500 MG tablet, Take 1,000 mg by mouth every 8 (eight) hours as needed for moderate pain or headache. (Patient not taking: Reported on 03/24/2023), Disp: , Rfl:    amoxicillin (AMOXIL) 500 MG capsule, Take 2,000 mg by mouth as directed. Prior to dental appointments. 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   21 mm Edwards Inspiris Resilia stented bovine pericardial tissue valve via right mini thoracotomy approach   Sensorineural hearing loss of both ears    Snoring 03/20/2021   [facility] Study Feb 2024: normal   Thrombosed hemorrhoids    hsitory of   Tubular adenoma of colon    Vitamin D deficiency     PAST SURGICAL HISTORY: Past Surgical History:  Procedure Laterality Date   AORTIC VALVE REPLACEMENT N/A 07/23/2019   Procedure: MINIMALLY INVASIVE AORTIC VALVE REPLACEMENT (AVR) using Cyndee Brightly Resilia 21 MM Aortic Valve.;  Surgeon: Purcell Nails, MD;  Location: [facility] OR;  Service: Open Heart Surgery;  Laterality: N/A;   ATRIAL FIBRILLATION ABLATION N/A 03/15/2020   Procedure: ATRIAL FIBRILLATION ABLATION;  Surgeon: Hillis Range, MD;  Location: [facility] INVASIVE CV LAB;  Service: Cardiovascular;  Laterality: N/A;   CARDIAC CATHETERIZATION     COLON SURGERY  2006   1 foot ascending colon removed    COLONOSCOPY     HERNIA REPAIR     PACEMAKER IMPLANT N/A 07/29/2019   Procedure: PACEMAKER IMPLANT;  Surgeon: Hillis Range, MD;  Location: [facility] INVASIVE CV LAB;  Service: Cardiovascular;  Laterality: N/A;   POLYPECTOMY     RIGHT HEART CATH AND CORONARY ANGIOGRAPHY N/A 06/08/2019   Procedure: RIGHT HEART CATH AND CORONARY ANGIOGRAPHY;  Surgeon: Tonny Bollman, MD;  Location: [facility] INVASIVE CV LAB;  Service: Cardiovascular;  Laterality: N/A;   TEE WITHOUT CARDIOVERSION N/A 07/23/2019   Procedure: TRANSESOPHAGEAL ECHOCARDIOGRAM (TEE);  Surgeon: Purcell Nails, MD;  Location: [facility] OR;  Service: Open Heart Surgery;  Laterality: N/A;   TESTICLE SURGERY      FAMILY HISTORY: Family History  Problem Relation Age of  Onset   Heart disease Maternal Uncle    Hypertension Brother    Colon cancer Neg Hx    Colon polyps Neg Hx    Esophageal cancer Neg Hx    Rectal cancer Neg Hx    Stomach cancer Neg Hx     SOCIAL HISTORY:  Social History   Socioeconomic History   Marital status: Single    Spouse name: Not on file   Number of children: Not on file   Years of education: Not on file   Highest education level: Professional school degree (e.g., MD, DDS, DVM, JD)  Occupational History   Occupation: Retired Education officer, community  Tobacco Use   Smoking status: Never   Smokeless tobacco: Never   Tobacco comments:    Never smoke 11/06/21  Vaping Use   Vaping status: Never Used  Substance and Sexual Activity   Alcohol use: Yes    Alcohol/week: 6.0 - 8.0 standard drinks of alcohol    Types: 6 - 8 Glasses of wine per week    Comment: 2 glass 3-4 times a week 05/08/21   Drug use: No   Sexual activity: Not on file  Other Topics Concern   Not on file  Social History Narrative   Lives alone, currently has a friend living with him til around April   Left handed   Caffeine: 5 cups of coffee in the AM  Social Drivers of Health   Financial Resource Strain: Low Risk  (11/21/2020)   Received from [facility] visits prior to 05/25/2022., [facility] visits prior to 05/25/2022.   Overall Financial Resource Strain (CARDIA)    Difficulty of Paying Living Expenses: Not hard at all  Food Insecurity: Low Risk  (03/12/2023)   [facility]   Hunger Vital Sign    Worried About Running Out of Food in the Last Year: Never true    Ran Out of Food in the Last Year: Never true  Transportation Needs: No Transportation Needs (03/12/2023)   Received from [facility]    In the past 12 months, has lack of reliable transportation kept you from medical appointments, meetings, work or from getting things needed for daily living? : No  Physical Activity:  Sufficiently Active (11/21/2020)   Received from [facility] visits prior to 05/25/2022., [facility] visits prior to 05/25/2022.   Exercise Vital Sign    Days of Exercise per Week: 4 days    Minutes of Exercise per Session: 40 min  Stress: No Stress Concern Present (11/21/2020)   Received from [facility] visits prior to 05/25/2022., [facility] visits prior to 05/25/2022.   [employer] of Occupational Health - Occupational Stress Questionnaire    Feeling of Stress : Only a little  Social Connections: Moderately Isolated (11/21/2020)   Received from [facility] visits prior to 05/25/2022., [facility] visits prior to 05/25/2022.   Social Advertising account executive [NHANES]    Frequency of Communication with Friends and Family: More than three times a week    Frequency of Social Gatherings with Friends and Family: More than three times a week    Attends Religious Services: Never    Database administrator or Organizations: Yes    Attends Engineer, structural: More than 4 times per year    Marital Status: Widowed  Intimate Partner Violence: Not At Risk (11/21/2020)   Received from [facility] visits prior to 05/25/2022., [facility] visits prior to 05/25/2022.   Humiliation, Afraid, Rape, and Kick questionnaire    Fear of Current or Ex-Partner: No    Emotionally Abused: No    Physically Abused: No    Sexually Abused: No     PHYSICAL EXAM  Vitals:   03/24/23 1449  BP: 108/69  Pulse: 61  Weight: 164 lb 8 oz (74.6 kg)  Height: 5' 9.5" (1.765 m)    Body mass index is 23.94 kg/m.   General: The patient is well-developed and well-nourished and in no acute distress  HEENT:  Head is [place]/AT.  Sclera are anicteric.    Neck: Good range of motion.  Skin: Extremities are without rash.  He has mild edema at the  ankles.  Neurologic Exam  Mental status: The patient is alert and oriented x 3 at the time of the examination.  Details of the [facility] cognitive assessment are above.   Memory seemed appropriate today.  Cranial nerves: Extraocular movements are full.  Facial strength and sensation was normal.  The tongue is midline, and the patient has symmetric elevation of the soft palate. No obvious hearing deficits are noted.  Motor:  Muscle bulk is normal.   Tone is normal. Strength is  5 / 5 in all 4  extremities.   Sensory: Normal sensation to touch and temperature in the arms and legs.  Coordination: Cerebellar testing reveals good finger-nose-finger and heel-to-shin bilaterally.  Gait and station: Station is normal.    Gait is pretty normal.  Tandem gait is mildly wide.. Romberg is negative.   Reflexes: Deep tendon reflexes are symmetric and normal bilaterally.   Plantar responses are flexor.    DIAGNOSTIC DATA (LABS, IMAGING, TESTING) - I reviewed patient records, labs, notes, testing and imaging myself where available.  Lab Results  Component Value Date   WBC 9.5 03/14/2023   HGB 14.9 03/14/2023   HCT 43.6 03/14/2023   MCV 90.5 03/14/2023   PLT 143 (L) 03/14/2023      Component Value Date/Time   NA 136 03/14/2023 0540   NA 139 08/15/2021 1455   K 3.2 (L) 03/14/2023 0540   CL 103 03/14/2023 0540   CO2 22 03/14/2023 0540   GLUCOSE 145 (H) 03/14/2023 0540   BUN 24 (H) 03/14/2023 0540   BUN 24 08/15/2021 1455   CREATININE 1.05 03/14/2023 0540   CALCIUM 9.7 03/14/2023 0540   PROT 6.4 (L) 03/14/2023 0540   PROT 6.7 09/07/2019 0931   ALBUMIN 3.4 (L) 03/14/2023 0540   ALBUMIN 4.0 09/07/2019 0931   AST 35 03/14/2023 0540   ALT 29 03/14/2023 0540   ALKPHOS 50 03/14/2023 0540   BILITOT 0.6 03/14/2023 0540   BILITOT 0.4 09/07/2019 0931   GFRNONAA >60 03/14/2023 0540   GFRAA 99 02/22/2020 1527   Lab Results  Component Value Date   HGBA1C 5.7 (H) 07/19/2019   No results found  for: "VITAMINB12" Lab Results  Component Value Date   TSH 0.618 09/06/2022       ASSESSMENT AND PLAN  Alzheimer's disease (HCC)  Seizure disorder (HCC)  Persistent atrial fibrillation (HCC)  Mild cognitive impairment  OSA (obstructive sleep apnea)   Last week he had a generalized tonic-clonic seizure and previously had had a couple episodes of aphasia, each lasting about 5 to 8 minutes.  He will continue levetiracetam.. He has OSA and is tolerating CPAP.  He has not yet noted any significant difference on treatment versus before treatment.   Stay active and exercise as tolerated. He has mild cognitive impairment associated with Alzheimer's disease (PET positive) we had discussed lecanemab and donanamemab.  He is concerned about the frequent infusions and potential side effects we will hold off at this time but read more about these and discussed with family/friends.  He will return to see [place] in 5 months or sooner if there are new or worsening neurologic symptoms.   44-minute office visit with the majority of the time spent face-to-face for history and physical, discussion/counseling and decision-making.  Additional time with record review and documentation.   This visit is part of a comprehensive longitudinal care medical relationship regarding the patients primary diagnosis of Alzheimer's disease and related concerns.  Willmar Stockinger A. Epimenio Foot, MD, [facility] 03/24/2023, 7:12 PM Certified in Neurology, Clinical Neurophysiology, Sleep Medicine and Neuroimaging  [facility] [address], [address] [place], [place] [address] [phone]

## 2023-04-10 ENCOUNTER — Other Ambulatory Visit: Payer: Self-pay | Admitting: Neurology

## 2023-04-10 ENCOUNTER — Telehealth: Payer: Self-pay | Admitting: Neurology

## 2023-04-10 MED ORDER — LEVETIRACETAM 500 MG PO TABS: 500.0000 mg | ORAL_TABLET | Freq: Two times a day (BID) | ORAL | 1 refills | Status: DC

## 2023-04-10 NOTE — Telephone Encounter (Signed)
Pt requesting refill of levETIRAcetam (KEPPRA) 500 MG tablet Send to [id number]

## 2023-04-10 NOTE — Telephone Encounter (Signed)
Rx refilled for 90 day supply. 

## 2023-04-10 NOTE — Telephone Encounter (Signed)
Rx sent 

## 2023-04-11 ENCOUNTER — Encounter: Payer: Self-pay | Admitting: Neurology

## 2023-04-15 ENCOUNTER — Ambulatory Visit: Payer: Medicare Other | Admitting: Physician Assistant

## 2023-04-27 ENCOUNTER — Encounter: Payer: Self-pay | Admitting: Neurology

## 2023-05-02 ENCOUNTER — Telehealth: Payer: Self-pay | Admitting: Home Health

## 2023-05-02 ENCOUNTER — Telehealth: Payer: Self-pay

## 2023-05-02 ENCOUNTER — Ambulatory Visit (INDEPENDENT_AMBULATORY_CARE_PROVIDER_SITE_OTHER): Payer: Medicare Other

## 2023-05-02 DIAGNOSIS — I442 Atrioventricular block, complete: Secondary | ICD-10-CM | POA: Diagnosis not present

## 2023-05-02 LAB — CUP PACEART REMOTE DEVICE CHECK
Battery Remaining Longevity: 89 mo
Battery Remaining Percentage: 69 %
Battery Voltage: 3.01 V
Brady Statistic AP VP Percent: 1.5 %
Brady Statistic AP VS Percent: 32 %
Brady Statistic AS VP Percent: 1 %
Brady Statistic AS VS Percent: 66 %
Brady Statistic RA Percent Paced: 30 %
Brady Statistic RV Percent Paced: 2.5 %
Date Time Interrogation Session: 20250207030506
Implantable Lead Connection Status: 753985
Implantable Lead Connection Status: 753985
Implantable Lead Implant Date: 20210506
Implantable Lead Implant Date: 20210506
Implantable Lead Location: 753859
Implantable Lead Location: 753860
Implantable Pulse Generator Implant Date: 20210506
Lead Channel Impedance Value: 430 Ohm
Lead Channel Impedance Value: 450 Ohm
Lead Channel Pacing Threshold Amplitude: 0.5 V
Lead Channel Pacing Threshold Amplitude: 1.125 V
Lead Channel Pacing Threshold Pulse Width: 0.4 ms
Lead Channel Pacing Threshold Pulse Width: 0.4 ms
Lead Channel Sensing Intrinsic Amplitude: 1.7 mV
Lead Channel Sensing Intrinsic Amplitude: 4.8 mV
Lead Channel Setting Pacing Amplitude: 1.375
Lead Channel Setting Pacing Amplitude: 1.5 V
Lead Channel Setting Pacing Pulse Width: 0.4 ms
Lead Channel Setting Sensing Sensitivity: 0.7 mV
Pulse Gen Model: 2272
Pulse Gen Serial Number: 3825759

## 2023-05-02 NOTE — Telephone Encounter (Signed)
 Patient called, state he was called today and told he had A fib, he was not sure what he needed to do with his device, he asked for instruction. Per office RN notes, he was told to send updated transmission. He states he does not know how. Advised the patient to call [employer], he states he does not have PPM card or number to call, told patient to call [phone]. He has appt on 2/19 with EP. He understands the concerning symptoms for A fib when he needs to go to ER.

## 2023-05-02 NOTE — Telephone Encounter (Addendum)
 Device alert:  1 HVR classified episode on 05/01/23 at 9:24 am, EGM c/w AF with RVR at 182 bpm, occasional V undersensing noted; routed to clinic for review.  114 AMS classified episodes, longest 8 hr 33 min on 04/14/23 at 2:35 pm, Burden 1.6%, on Eliquis  with hx of AF/AFL per Epic.   Patient is being followed by R. Fenton PA in [place] clinic.  Will forward for their team to follow up with patient, recent elevations in AF frequency with poor rate control during events.  Most recent 05/01/23.  Longest recent episode: 2 hours.  V undersensing noted during tachy events by device clinic, will continue to monitor.  Also due to see Dr. Cindie this month, will copy to EP scheduling team to get on the books.

## 2023-05-02 NOTE — Telephone Encounter (Signed)
 Called patient to assess sx's.  Patient states he has been feeling well, a little more fatigued today.  Has been exercising and doing yard work during those events most likely. He will send us  an updated transmission when he gets home.  No missed doses of meds, no changes to diet or health. He has ER precautions if has more concerning sx's: SOB, chest pain/syncopal or near syncopal events.   Will have AF clinic to follow up on Monday.  Dr. Cindie will see patient in office on 05/14/23.   Will also make the following programming adjustments to device at that time: Adjust V auto sensitivity from .7 to 0.5 to avoid undersensing. Adjust Max Track Rate and PMT detection to both 130 to match Upper Sensor Rate at 130.

## 2023-05-03 ENCOUNTER — Encounter: Payer: Self-pay | Admitting: Cardiology

## 2023-05-05 ENCOUNTER — Other Ambulatory Visit: Payer: Self-pay | Admitting: Cardiovascular Disease

## 2023-05-05 NOTE — Telephone Encounter (Signed)
 Prescription refill request for Eliquis  received. Indication:afib Last office visit:9/24 Scr:1.05  12/24 Age: 73 Weight:74.6  kg  Prescription refilled

## 2023-05-05 NOTE — Telephone Encounter (Signed)
 I spoke with patient on 2/7. He was having AF/RVR events over past week, multiple events, longest 2 hours; however with elevated V rates. AF clinic to follow up with patient today.  I will get an updated transmission.

## 2023-05-05 NOTE — Telephone Encounter (Signed)
 Dr. Marven Slimmer reviewed transmission per patient my chart message. Jerry Foster also felt stated burden was better than previous.   Patient doing well, only fatigue noted. Will see Dr. Marven Slimmer on 2/19 for follow up.

## 2023-05-14 ENCOUNTER — Ambulatory Visit: Payer: Medicare Other | Admitting: Cardiology

## 2023-05-18 ENCOUNTER — Ambulatory Visit (HOSPITAL_COMMUNITY)
Admission: EM | Admit: 2023-05-18 | Discharge: 2023-05-18 | Disposition: A | Discharge status: Home / Self Care | Payer: Medicare Other | Attending: Family Medicine | Admitting: Family Medicine

## 2023-05-18 ENCOUNTER — Other Ambulatory Visit: Payer: Self-pay

## 2023-05-18 ENCOUNTER — Encounter (HOSPITAL_COMMUNITY): Payer: Self-pay | Admitting: Emergency Medicine

## 2023-05-18 DIAGNOSIS — J069 Acute upper respiratory infection, unspecified: Secondary | ICD-10-CM

## 2023-05-18 LAB — POC COVID19/FLU A&B COMBO
Covid Antigen, POC: NEGATIVE
Influenza A Antigen, POC: NEGATIVE
Influenza B Antigen, POC: NEGATIVE

## 2023-05-18 NOTE — ED Provider Notes (Addendum)
 [facility]    CSN: [id number] Arrival date & time: 05/18/23  1112      History   Chief Complaint Chief Complaint  Patient presents with   URI    HPI Jerry Foster is a 73 y.o. male.   The history is provided by the patient. No language interpreter was used.  Cough Cough characteristics:  Productive Sputum characteristics:  Yellow Onset quality:  Gradual Duration:  4 days Timing:  Intermittent Progression:  Improving Chronicity:  New Smoker: no   Context: sick contacts   Context comment:  His partner also had a cold recently and he is getting better Relieved by: Mucinex, Tylenol, Drinking plenty of fluid. Worsened by:  Nothing Associated symptoms: sinus congestion   Associated symptoms: no chest pain, no chills, no ear fullness, no ear pain, no fever, no rhinorrhea, no shortness of breath, no sore throat and no wheezing    He had covid test done 3 days ago and flu test 1 day ago which were negative. Would like to recheck.  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   21 mm Edwards Inspiris Resilia stented bovine pericardial tissue valve via right mini thoracotomy approach   Sensorineural hearing loss of both ears    Snoring 03/20/2021   [facility] Feb 2024: normal   Thrombosed hemorrhoids    hsitory of   Tubular adenoma of colon    Vitamin D deficiency     Patient Active Problem List   Diagnosis Date Noted   Sleep disorder breathing 04/24/2022   Hypertension 07/24/2021    Memory loss 03/20/2021   Snoring 03/20/2021   Excessive daytime sleepiness 03/20/2021   Typical atrial flutter (HCC) 02/11/2020   Persistent atrial fibrillation (HCC) 08/04/2019   Hypercoagulable state due to persistent atrial fibrillation (HCC) 08/04/2019   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   Atrial fibrillation, rapid (HCC) 07/25/2019   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   Severe aortic stenosis 06/08/2019   Sensorineural hearing loss (SNHL) of both ears 01/08/2018   Heart murmur 06/29/2015   Vitamin D deficiency 11/24/2014   Benign prostatic hyperplasia 06/15/2014   Colon polyp 06/15/2014   History of fracture of vertebral column 06/15/2014   Osteoporosis 06/15/2014   Pure hypercholesterolemia 06/15/2014   Right bundle branch block 06/15/2014   Osteopenia determined by x-ray 01/24/1989    Past Surgical History:  Procedure Laterality Date   AORTIC VALVE REPLACEMENT N/A 07/23/2019   Procedure: MINIMALLY INVASIVE AORTIC VALVE REPLACEMENT (AVR) using Cyndee Brightly Resilia 21 MM Aortic Valve.;  Surgeon: Purcell Nails, MD;  [facility]: [facility];  Service: Open Heart Surgery;  Laterality: N/A;   ATRIAL FIBRILLATION ABLATION N/A 03/15/2020   Procedure: ATRIAL FIBRILLATION ABLATION;  Surgeon: Hillis Range, MD;  [facility]: [facility];  Service: Cardiovascular;  Laterality: N/A;   CARDIAC CATHETERIZATION     COLON SURGERY  2006  1 foot ascending colon removed    COLONOSCOPY     HERNIA REPAIR     PACEMAKER IMPLANT N/A 07/29/2019   Procedure: PACEMAKER IMPLANT;  Surgeon: Hillis Range, MD;  [facility];  Service: Cardiovascular;  Laterality: N/A;   POLYPECTOMY     RIGHT HEART CATH AND CORONARY ANGIOGRAPHY N/A 06/08/2019   Procedure: RIGHT HEART CATH AND CORONARY ANGIOGRAPHY;  Surgeon: Tonny Bollman, MD;  [facility] INV[facility] CV LAB;  Service: Cardiovascular;  Laterality: N/A;   TEE WITHOUT CARDIOVERSION N/A  07/23/2019   Procedure: TRANSESOPHAGEAL ECHOCARDIOGRAM (TEE);  Surgeon: Purcell Nails, MD;  Location: [facility] OR;  Service: Open Heart Surgery;  Laterality: N/A;   TESTICLE SURGERY         Home Medications    Prior to Admission medications   Medication Sig Start Date End Date Taking? Authorizing Provider  acetaminophen (TYLENOL) 500 MG tablet Take 1,000 mg by mouth every 8 (eight) hours as needed for moderate pain or headache. Patient not taking: Reported on 03/24/2023    [provider]  amLODipine (NORVASC) 10 MG tablet TAKE 1 TABLET(10 MG) BY MOUTH DAILY 06/11/22   Tonny Bollman, MD  amoxicillin (AMOXIL) 500 MG capsule Take 2,000 mg by mouth as directed. Prior to dental appointments. Patient not taking: Reported on 03/14/2023    [provider]  aspirin EC 81 MG tablet Take 81 mg by mouth every evening.    [provider]  atorvastatin (LIPITOR) 80 MG tablet Take 1 tablet by mouth at bedtime. 03/11/23   [provider]  Calcium Citrate-Vitamin D (CITRACAL + D PO) Take 1 tablet by mouth in the morning and at bedtime.    [provider]  chlorthalidone (HYGROTON) 25 MG tablet TAKE 1/2 TABLET BY MOUTH DAILY 02/17/23   Tonny Bollman, MD  Cholecalciferol (DIALYVITE VITAMIN D 5000) 125 MCG (5000 UT) capsule Take 5,000 Units by mouth every evening.    [provider]  donepezil (ARICEPT) 10 MG tablet Take 1 tablet (10 mg total) by mouth at bedtime. 06/12/22   Sater, Pearletha Furl, MD  Doxepin HCl 3 MG TABS Take 1 tablet by mouth at bedtime as needed (sleep). 08/20/22   [provider]  ELIQUIS 5 MG TABS tablet TAKE 1 TABLET(5 MG) BY MOUTH TWICE DAILY 05/05/23   Tonny Bollman, MD  levETIRAcetam (KEPPRA) 500 MG tablet TAKE 1 TABLET(500 MG) BY MOUTH TWICE DAILY 04/10/23   Sater, Pearletha Furl, MD  MELATONIN PO Take by mouth at bedtime as needed (sleep).    [provider]  Omega-3 1000 MG CAPS Take 1 capsule by mouth daily.     [provider]  potassium chloride SA (KLOR-CON M) 20 MEQ tablet Take 1 tablet (20 mEq total) by mouth 2 (two) times daily. 10/30/21   Sater, Pearletha Furl, MD  sertraline (ZOLOFT) 50 MG tablet TAKE 1 TABLET(50 MG) BY MOUTH DAILY 10/22/22   Sater, Pearletha Furl, MD    Family History Family History  Problem Relation Age of Onset   Heart disease Maternal Uncle    Hypertension Brother    Colon cancer Neg Hx    Colon polyps Neg Hx    Esophageal cancer Neg Hx    Rectal cancer Neg Hx    Stomach cancer Neg Hx     Social History Social History   Tobacco Use   Smoking status: Never   Smokeless tobacco: Never   Tobacco comments:    Never smoke 11/06/21  Vaping Use  Vaping status: Never Used  Substance Use Topics   Alcohol use: Yes    Alcohol/week: 6.0 - 8.0 standard drinks of alcohol    Types: 6 - 8 Glasses of wine per week    Comment: 2 glass 3-4 times a week 05/08/21   Drug use: No     Allergies   Lamisil [terbinafine]   Review of Systems Review of Systems  Constitutional:  Negative for chills and fever.  HENT:  Negative for ear pain, rhinorrhea and sore throat.   Respiratory:  Positive for cough. Negative for shortness of breath and wheezing.   Cardiovascular:  Negative for chest pain.  All other systems reviewed and are negative.    Physical Exam Triage Vital Signs ED Triage Vitals  Encounter Vitals Group     BP      Systolic BP Percentile      Diastolic BP Percentile      Pulse      Resp      Temp      Temp src      SpO2      Weight      Height      Head Circumference      Peak Flow      Pain Score      Pain Loc      Pain Education      Exclude from Growth Chart    No data found.  Updated Vital Signs BP 118/78 (BP Location: Right Arm)   Pulse 65   Temp 97.7 F (36.5 C) (Oral)   Resp 18   SpO2 98%   Visual Acuity Right Eye Distance:   Left Eye Distance:   Bilateral Distance:    Right Eye Near:   Left Eye Near:    Bilateral Near:      Physical Exam Vitals and nursing note reviewed.  Constitutional:      Appearance: Normal appearance.  HENT:     Right Ear: Tympanic membrane and ear canal normal. There is no impacted cerumen.     Left Ear: Tympanic membrane and ear canal normal. There is no impacted cerumen.     Mouth/Throat:     Mouth: Mucous membranes are moist.     Pharynx: No oropharyngeal exudate or posterior oropharyngeal erythema.  Eyes:     General:        Right eye: No discharge.        Left eye: No discharge.     Conjunctiva/sclera: Conjunctivae normal.  Cardiovascular:     Rate and Rhythm: Normal rate and regular rhythm.     Heart sounds: Normal heart sounds. No murmur heard. Pulmonary:     Effort: Pulmonary effort is normal. No respiratory distress.     Breath sounds: Normal breath sounds. No wheezing.  Neurological:     Mental Status: He is alert.      UC Treatments / Results  Labs (all labs ordered are listed, but only abnormal results are displayed) Labs Reviewed  POC COVID19/FLU A&B COMBO    EKG   Radiology No results found.  Procedures Procedures (including critical care time)  Medications Ordered in UC Medications - No data to display  Initial Impression / Assessment and Plan / UC Course  I have reviewed the triage vital signs and the nursing notes.  Pertinent labs & imaging results that were available during my care of the patient were reviewed by me and considered in my medical decision making (see chart for details).  Clinical Course as of 05/18/23 1355  Sun May 18, 2023  1353 URI Negative COVID and flu tests Likely a simple viral illness Pulm exam benign Conservative management discussed with strict return precautions F/U with PCP soon He and his partner agreed with the plan [KE]    Clinical Course User Index [KE] Doreene Eland, MD      Final Clinical Impressions(s) / UC Diagnoses   Final diagnoses:  Viral upper respiratory tract infection      Discharge Instructions      It was nice seeing you today. I am sorry about your cough symptoms. Your lung exam is clear and normal. Your repeat COVID-19 and influenza tests were negative. May continue home cough remedy. Let us give you time to completely resolve your symptoms. See [place] soon or PCP if symptoms persist.     ED Prescriptions   None    PDMP not reviewed this encounter.   Doreene Eland, MD 05/18/23 1343    Doreene Eland, MD 05/18/23 [phone]

## 2023-05-18 NOTE — ED Triage Notes (Signed)
 Pt c/o cold like symptoms for a week with persistent cough, chills and chest congestion taking OTC medication with no relief.

## 2023-05-18 NOTE — Discharge Instructions (Addendum)
 It was nice seeing you today. I am sorry about your cough symptoms. Your lung exam is clear and normal. Your repeat COVID-19 and influenza tests were negative. May continue home cough remedy. Let us give you time to completely resolve your symptoms. See [place] soon or PCP if symptoms persist.

## 2023-05-20 ENCOUNTER — Telehealth: Payer: Self-pay | Admitting: Neurology

## 2023-05-20 ENCOUNTER — Encounter: Payer: Self-pay | Admitting: Neurology

## 2023-05-20 NOTE — Telephone Encounter (Signed)
 Pt is asking for a call to know what Dr Epimenio Foot thinks about putting him on Donanemab for his Alzheimer's

## 2023-05-20 NOTE — Telephone Encounter (Signed)
 Pt last saw Dr. Epimenio Foot 03/24/23. 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 He is concerned about the frequent infusions and potential side effects we will hold off at this time but read more about these and discussed with family/friends."

## 2023-05-22 ENCOUNTER — Encounter: Payer: Self-pay | Admitting: Pharmacist

## 2023-05-22 ENCOUNTER — Encounter: Payer: Self-pay | Admitting: Cardiovascular Disease

## 2023-05-22 DIAGNOSIS — F0280 Dementia in other diseases classified elsewhere without behavioral disturbance: Secondary | ICD-10-CM | POA: Insufficient documentation

## 2023-05-25 NOTE — Progress Notes (Unsigned)
 Electrophysiology Office Follow up Visit Note:    Date:  05/26/2023   ID:  Jerry Foster, DOB December 26, 1950, MRN [id number]  PCP:  Loyal Jacobson, MD  [facility] Cardiologist:  Tonny Bollman, MD  [facility] Electrophysiologist:  Lanier Prude, MD    Interval History:     Jerry Foster is a 73 y.o. male who presents for a follow up visit.   He was last seen 11/29/2022 by Mardelle Matte. He has a history of AS s/p AVR, tachybrady s/p PPM, AFL. He had a prior ablation in 02/2020 by Dr Johney Frame. He takes Eliquis.   Device alert 05/02/2023 for HVR that appeared to be AF w/ RVR. At that time he was feeling fatigued. There was undersensing on the atrial lead.   He is with his brother.  He tells me he has been experiencing worsening memory and dementia.      Past medical, surgical, social and family history were reviewed.  ROS:   Please see the history of present illness.    All other systems reviewed and are negative.  EKGs/Labs/Other Studies Reviewed:    The following studies were reviewed today:  05/26/2023 In clinic device interrogation personally reviewed. Battery longevity 7.7 years Adjusted ventricular lead max sensitivity today Presenting rhythm atrial sensed, ventricular 6 Atrial fibrillation burden 1.6 Atrial pacing 30% Ventricular pacing 2.6%    EKG Interpretation Date/Time:  Monday May 26 2023 08:52:06 EST Ventricular Rate:  64 PR Interval:  202 QRS Duration:  156 QT Interval:  450 QTC Calculation: 464 R Axis:   88  Text Interpretation: Normal sinus rhythm with sinus arrhythmia Right bundle branch block Confirmed by Steffanie Dunn [phone]) on 05/26/2023 8:57:07 AM    Physical Exam:    VS:  BP 118/70   Pulse 64   Ht 5' 9.5" (1.765 m)   Wt 173 lb (78.5 kg)   SpO2 (!) 86%   BMI 25.18 kg/m     Wt Readings from Last 3 Encounters:  05/26/23 173 lb (78.5 kg)  03/24/23 164 lb 8 oz (74.6 kg)  03/14/23 165 lb (74.8 kg)     GEN: no distress CARD: RRR, No  MRG. PPM pocket well healed.  RESP: No IWOB. CTAB.      ASSESSMENT:    1. Heart block AV complete (HCC)   2. Primary hypertension   3. Cardiac pacemaker in situ   4. Persistent atrial fibrillation (HCC)    PLAN:    In order of problems listed above:  #complete heart block #Permanent pacemaker in situ Device functioning appropriately.  Continue remote monitoring.  #Persistent atrial fibrillation I suspect he has a higher burden than his device suggests.  He is symptomatic while out of rhythm.  He has a history of prior catheter ablation by Dr. Johney Frame during which the pulmonary veins and CTI were targeted.  He takes Eliquis but would like to avoid long-term exposure anticoagulation if at all possible.  We discussed left atrial appendage occlusion during today's appointment.  His history of dementia and memory troubles complicates his decision to pursue a procedure with general anesthesia.  He will think it over with his family and let us know if he wants to proceed.  I do think he would be reasonable candidate should he decide to proceed.  ---------------  I have seen Jerry Foster in the office today who is being considered for a Watchman left atrial appendage closure device. I believe they will benefit from this procedure given their history of atrial  fibrillation, CHA2DS2-VASc score of 2 and unadjusted ischemic stroke rate of 2.2% per year. Unfortunately, the patient is not felt to be a long term anticoagulation candidate secondary to dementia and a desire to avoid long term risk of bleeding. The patient's chart has been reviewed and I feel that they would be a candidate for short term oral anticoagulation after Watchman implant.   It is my belief that after undergoing a LAA closure procedure, Jerry Foster will not need long term anticoagulation which eliminates anticoagulation side effects and major bleeding risk.   Procedural risks for the Watchman implant have been reviewed with  the patient including a 0.5% risk of stroke, <1% risk of perforation and <1% risk of device embolization. Other risks include bleeding, vascular damage, tamponade, worsening renal function, and death. The patient understands these risk and wishes to proceed.     The published clinical data on the safety and effectiveness of WATCHMAN include but are not limited to the following: - Holmes DR, Everlene Farrier, Sick P et al. for the PROTECT AF Investigators. Percutaneous closure of the left atrial appendage versus warfarin therapy for prevention of stroke in patients with atrial fibrillation: a randomised non-inferiority trial. Lancet 2009; 374: 534-42. Everlene Farrier, Doshi SK, Isa Rankin D et al. on behalf of the PROTECT AF Investigators. Percutaneous Left Atrial Appendage Closure for Stroke Prophylaxis in Patients With Atrial Fibrillation 2.3-Year Follow-up of the PROTECT AF (Watchman Left Atrial Appendage System for Embolic Protection in Patients With Atrial Fibrillation) Trial. Circulation 2013; 127:720-729. - Alli O, Doshi S,  Kar S, Reddy VY, Sievert H et al. Quality of Life Assessment in the Randomized PROTECT AF (Percutaneous Closure of the Left Atrial Appendage Versus Warfarin Therapy for Prevention of Stroke in Patients With Atrial Fibrillation) Trial of Patients at Risk for Stroke With Nonvalvular Atrial Fibrillation. J Am Coll Cardiol 2013; 61:1790-8. Aline August DR, Mia [place], Price M, Whisenant B, Sievert H, Doshi S, Huber K, Reddy V. Prospective randomized evaluation of the Watchman left atrial appendage Device in patients with atrial fibrillation versus long-term warfarin therapy; the PREVAIL trial. Journal of the [employer] of Cardiology, Vol. 4, No. 1, 2014, 1-11. - Kar S, Doshi SK, Sadhu A, Horton R, Osorio J et al. Primary outcome evaluation of a next-generation left atrial appendage closure device: results from the PINNACLE FLX trial. Circulation 2021;143(18)1754-1762.    After today's visit  with the patient which was dedicated solely for shared decision making visit regarding LAA closure device, the patient decided to proceed with the LAA appendage closure procedure scheduled to be done in the near future at [facility]. Prior to the procedure, I would like to obtain a gated CT scan of the chest with contrast timed for PV/LA visualization.   Additionally, the patient will need an updated echo.[name]  HAS-BLED score 1 Hypertension No  Abnormal renal and liver function (Dialysis, transplant, Cr >2.26 mg/dL /Cirrhosis or Bilirubin >2x Normal or AST/ALT/AP >3x Normal) No  Stroke No  Bleeding No  Labile INR (Unstable/high INR) No  Elderly (>65) Yes  Drugs or alcohol (>= 8 drinks/week, anti-plt or NSAID) No   CHA2DS2-VASc Score = 2  The patient's score is based upon: CHF History: 0 HTN History: 0 Diabetes History: 0 Stroke History: 0 Vascular Disease History: 1 Age Score: 1 Gender Score: 0      Signed, Steffanie Dunn, MD, [facility], [facility] 05/26/2023 9:14 AM    Electrophysiology [facility]

## 2023-05-26 ENCOUNTER — Encounter: Payer: Self-pay | Admitting: Cardiology

## 2023-05-26 ENCOUNTER — Ambulatory Visit: Payer: Medicare Other | Attending: Cardiology | Admitting: Cardiology

## 2023-05-26 VITALS — BP 118/70 | HR 64 | Ht 69.5 in | Wt 173.0 lb

## 2023-05-26 DIAGNOSIS — I10 Essential (primary) hypertension: Secondary | ICD-10-CM | POA: Diagnosis not present

## 2023-05-26 DIAGNOSIS — I4819 Other persistent atrial fibrillation: Secondary | ICD-10-CM

## 2023-05-26 DIAGNOSIS — Z950 Presence of cardiac pacemaker: Secondary | ICD-10-CM

## 2023-05-26 DIAGNOSIS — I442 Atrioventricular block, complete: Secondary | ICD-10-CM | POA: Diagnosis not present

## 2023-05-26 LAB — CUP PACEART INCLINIC DEVICE CHECK
Battery Remaining Longevity: 92 mo
Battery Voltage: 3.01 V
Brady Statistic RA Percent Paced: 30 %
Brady Statistic RV Percent Paced: 2.6 %
Date Time Interrogation Session: 20250303090647
Implantable Lead Connection Status: 753985
Implantable Lead Connection Status: 753985
Implantable Lead Implant Date: 20210506
Implantable Lead Implant Date: 20210506
Implantable Lead Location: 753859
Implantable Lead Location: 753860
Implantable Pulse Generator Implant Date: 20210506
Lead Channel Impedance Value: 425 Ohm
Lead Channel Impedance Value: 437.5 Ohm
Lead Channel Pacing Threshold Amplitude: 0.5 V
Lead Channel Pacing Threshold Amplitude: 0.5 V
Lead Channel Pacing Threshold Amplitude: 1 V
Lead Channel Pacing Threshold Amplitude: 1 V
Lead Channel Pacing Threshold Pulse Width: 0.4 ms
Lead Channel Pacing Threshold Pulse Width: 0.4 ms
Lead Channel Pacing Threshold Pulse Width: 0.4 ms
Lead Channel Pacing Threshold Pulse Width: 0.4 ms
Lead Channel Sensing Intrinsic Amplitude: 2.6 mV
Lead Channel Sensing Intrinsic Amplitude: 4.2 mV
Lead Channel Setting Pacing Amplitude: 1.25 V
Lead Channel Setting Pacing Amplitude: 1.5 V
Lead Channel Setting Pacing Pulse Width: 0.4 ms
Lead Channel Setting Sensing Sensitivity: 0.5 mV
Pulse Gen Model: 2272
Pulse Gen Serial Number: 3825759

## 2023-05-26 NOTE — Patient Instructions (Signed)
 Medication Instructions:  Your physician recommends that you continue on your current medications as directed. Please refer to the Current Medication list given to you today.  *If you need a refill on your cardiac medications before your next appointment, please call your pharmacy*  Follow-Up: At [facility] you and your health needs are our priority.  As part of our continuing mission to provide you with exceptional heart care, we have created designated Provider Care Teams.  These Care Teams include your primary Cardiologist (physician) and Advanced Practice Providers (APPs -  Physician Assistants and Nurse Practitioners) who all work together to provide you with the care you need, when you need it.   Your next appointment:   1 year  Provider:   You will see one of the following Advanced Practice Providers on your designated Care Team:   Francis Dowse, Charlott Holler "Mardelle Matte" Ashley, [place] Sherie Don, NP Canary Brim, NP  Other Instructions You have been referred to ENT

## 2023-05-27 ENCOUNTER — Other Ambulatory Visit: Payer: Self-pay | Admitting: Neurology

## 2023-05-27 ENCOUNTER — Encounter: Payer: Self-pay | Admitting: Cardiology

## 2023-05-27 NOTE — Telephone Encounter (Signed)
Please review drug interaction

## 2023-06-03 NOTE — Progress Notes (Signed)
 Remote pacemaker transmission.

## 2023-06-04 ENCOUNTER — Encounter: Payer: Self-pay | Admitting: Neurology

## 2023-06-06 ENCOUNTER — Other Ambulatory Visit: Payer: Self-pay | Admitting: Neurology

## 2023-06-09 ENCOUNTER — Encounter (HOSPITAL_BASED_OUTPATIENT_CLINIC_OR_DEPARTMENT_OTHER): Payer: Self-pay

## 2023-06-11 ENCOUNTER — Other Ambulatory Visit: Payer: Self-pay | Admitting: Neurology

## 2023-06-11 MED ORDER — LEVETIRACETAM 500 MG PO TABS: ORAL_TABLET | ORAL | 3 refills | Status: DC

## 2023-06-12 ENCOUNTER — Ambulatory Visit: Payer: Medicare Other | Admitting: Neurology

## 2023-06-25 ENCOUNTER — Other Ambulatory Visit: Payer: Self-pay | Admitting: Cardiovascular Disease

## 2023-06-25 ENCOUNTER — Telehealth: Payer: Self-pay | Admitting: Cardiovascular Disease

## 2023-06-25 DIAGNOSIS — I4891 Unspecified atrial fibrillation: Secondary | ICD-10-CM

## 2023-06-25 NOTE — Telephone Encounter (Signed)
 Eliquis 5mg  refill request received. Patient is 73 years old, weight-78.5kg, Crea-1.05 on 03/14/23, Diagnosis-Afib, and last seen by Dr. Lalla Brothers on 05/26/23. Dose is appropriate based on dosing criteria. Will send in refill to requested pharmacy.

## 2023-06-25 NOTE — Telephone Encounter (Signed)
*  STAT* If patient is at the pharmacy, call can be transferred to refill team.   1. Which medications need to be refilled? (please list name of each medication and dose if known)   ELIQUIS 5 MG TABS tablet     2. Would you like to learn more about the convenience, safety, & potential cost savings by using the [facility]? No    3. Are you open to using the [facility] (Type [facility].  ). No   4. Which pharmacy/location (including street and city if local pharmacy) is medication to be sent to? [id number] - [place], [place] - [address] AT [facility] [address]    5. Do they need a 30 day or 90 day supply? 90 day

## 2023-06-25 NOTE — Telephone Encounter (Signed)
 Refill sent by Pamala Hurry, RN in previous encounter.

## 2023-07-03 ENCOUNTER — Telehealth: Payer: Self-pay | Admitting: Neurology

## 2023-07-03 NOTE — Telephone Encounter (Signed)
 Pt is needing a refill on his levETIRAcetam (KEPPRA) 500 MG tablet and have it sent to the [facility] on [address] Pt is leaving out of town this afternoon and is wanting it called in this am due to him being totally out. Please advise.

## 2023-07-03 NOTE — Telephone Encounter (Signed)
 I called [facility] last Rx was prescribed on 06/11/23 with refills. [facility] said pt has Rx's transferred to [facility] [phone]. [facility] no longer has Rx.   I called patient and informed him refill should [facility]. Pt said they are open limited hours , per google they are open today from 9:30am-1:30pm. Pt said he will reach out to them.

## 2023-07-14 ENCOUNTER — Telehealth: Payer: Self-pay | Admitting: Neurology

## 2023-07-14 NOTE — Telephone Encounter (Signed)
 For compliance pt reports that his July appointment was too far out and that he needed to be seen earlier.  Odie Benne, RN was able to offer pt a much earlier appointment, he will now be seen 5-6 arriving at 1:30 for a 2:00 appointment

## 2023-07-28 NOTE — Progress Notes (Unsigned)
 Jerry Foster

## 2023-07-29 ENCOUNTER — Encounter: Payer: Self-pay | Admitting: Neurology

## 2023-07-29 ENCOUNTER — Ambulatory Visit: Admitting: Neurology

## 2023-07-29 VITALS — BP 116/66 | HR 68 | Ht 69.5 in | Wt 171.0 lb

## 2023-07-29 DIAGNOSIS — G4733 Obstructive sleep apnea (adult) (pediatric): Secondary | ICD-10-CM

## 2023-07-29 DIAGNOSIS — I4819 Other persistent atrial fibrillation: Secondary | ICD-10-CM

## 2023-07-29 DIAGNOSIS — G40909 Epilepsy, unspecified, not intractable, without status epilepticus: Secondary | ICD-10-CM | POA: Diagnosis not present

## 2023-07-29 DIAGNOSIS — G309 Alzheimer's disease, unspecified: Secondary | ICD-10-CM | POA: Diagnosis not present

## 2023-07-29 DIAGNOSIS — F0280 Dementia in other diseases classified elsewhere without behavioral disturbance: Secondary | ICD-10-CM | POA: Diagnosis not present

## 2023-07-29 DIAGNOSIS — G3184 Mild cognitive impairment, so stated: Secondary | ICD-10-CM

## 2023-07-29 NOTE — Progress Notes (Signed)
 [facility]  PATIENT: Jerry Foster DOB: [birth year]  REFERRING DOCTOR OR PCP: Jayne Mews, MD SOURCE: Patient, notes from primary care, laboratory reports.  _________________________________   HISTORICAL  CHIEF COMPLAINT:  Chief Complaint  Patient presents with   Follow-up    Pt in room 11.Friend in room. Here for memory,OSA, seizure follow up. Pt reports he has been doing better wearing cpap machine. No recent seizure, reports memory gotten worse short term.  MOCA:28    HISTORY OF PRESENT ILLNESS:  Jerry Foster, at [facility] for neurologic consultation regarding his memory loss and episodes of dizziness.  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  He feels memory is a little worse since the last visit.     He stopped driving due to getting lost near his home.  He is still reading and has memory of some of what he reads but not everything.  He needs to write things down more.  He has more trouble getting names out sometimes.   He always does better with hints.    He notes difficulty with card and board games now.     No further seizures.  He had one in late 2024 .   He was asleep and boyfriend noted at 3 AM he started to have convulsions and then had gasping followed by 20-30 minutes of very loud snoring.   He was unable to be roused and then became conscious.  He had a second event an hour or two later and ambulance was called.   He bit his tongue.  He went to the ED.    Imaging was non-contributary.    He was placed on Keppra  and tolerates it well.    He has been on CPAP one week and is tolerating it well.  He has not noted feeling less sleepy or more alert during the day.      Download shows excellent efficacy (AHI=2.3) but compliance was not great at 47%  He was on Eliquis  for a prosthetic heart valve and Atrial Fibrillation.     He is already on pravastatin  40 mg for  hyperlipidemia.   LDL was 117.  TChol was 189 most recently   EPWORTH SLEEPINESS SCALE  On a scale of 0 - 3 what is the chance of dozing:  Sitting and Reading:   2 Watching TV:    3 Sitting inactive in a public place: 1 Passenger in car for one hour: 3 Lying down to rest in the afternoon: 0 Sitting and talking to someone: 0 Sitting quietly after lunch:  1 In a car, stopped in traffic:  0  Total (out of 24):    10/24   mild EDS    Alzheimer's history: His amyloid 42/40 ratio was mildly reduced and [id number] was mildly increased on 02/18/2022.   The amyloid PET scan 01/25/2022 was equivocal but likely positive.      He scored 26/30 on the [facility] cognitive assessment today which is consistent with mild cognitive impairment.  He lost 3 points for delayed recall.  However, about 15 to 20 minutes later when I asked him about the 5 words, he got 3 correct without a prompt and an additional 1 with a category prompt..  Of note, he is educated (retired Education officer, community) so a score of 26 is certainly abnormal.       Earlier this year, because he had snoring and mild excessive daytime sleepiness we ordered a home sleep  study.  It did not show significant sleep apnea.  He has not noted more episodes of dizziness.  At MR angiogram was fine.  He had a AVR in 07/2019 followed by cardiac arrest and pacemaker placement. The surgery was performed with cardioplegia pump (time 159 minutes).  He developed AFib and had ablation January 2022 with good results.      His cardiologist is Dr. Arlester Ladd.     07/29/2023    2:07 PM 06/12/2022    3:10 PM 01/09/2022    9:48 AM 09/18/2021    9:28 AM 03/20/2021    8:35 AM  Montreal Cognitive Assessment   Visuospatial/ Executive (0/5) 4 5 5 5 4   Naming (0/3) 3 3 3 3 3   Attention: Read list of digits (0/2) 2 2 2 2 2   Attention: Read list of letters (0/1) 1 1 1 1 1   Attention: Serial 7 subtraction starting at 100 (0/3) 3 3 2 3 3   Language: Repeat phrase (0/2) 2 2 2 2 2    Language : Fluency (0/1) 1 1 1  0 1  Abstraction (0/2) 2 2 2 2 2   Delayed Recall (0/5) 4 5 2  0 3  Orientation (0/6) 6 6 6 6 6   Total 28 30 26 24 27   Adjusted Score (based on education)  30  24      His mother and several aunts and maternal GM had Alzheimer.   His mother started having issues at age 104 while others were in 18's and 33's Is father lived to late 35's with no cognitive issues.     Data: I personally reviewed the CT scan from 07/28/2019.  It shows mild generalized cortical atrophy that is typical for age.  There do not appear to be any large vessel strokes or acute findings in the brain.  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  MRI of the brain 11/05/2021 shows mild generalized cortical atrophy, possibly a little more pronounced in the medial temporal lobes.  It is unchanged compared to the CT scan from 07/28/2019 and 04/12/2021.  TSH, B12, Vit D and HIV were all normal or negative between 06/2020 and 11/2020.      CT 11/13/2022: No acute findings.    Mild stable atrophy  Carotid 11/20/2022 1. Moderate amount of left-sided atherosclerotic plaque, not  resulting in a hemodynamically significant stenosis.  2. Unremarkable sonographic evaluation of the right carotid system.   ECHO 10/22/2022 1. 21 mm AoV bioprosthesis. MG up to 18 mmHG on this study, but remains within limits. The aortic valve has been repaired/ replaced. Aortic valve regurgitation is not visualized. There is a 21 mm bioprosthetic valve present in the aortic position. Procedure Date: 4/ 30/ 2021. Echo findings are consistent with normal structure and function of the aortic valve prosthesis. Aortic valve area, by VTI measures 1. 00 cm . Aortic valve mean gradient measures 18. 0 mmHg. Aortic valve Vmax measures 2. 91 m/ s. 2. Left ventricular ejection fraction, by estimation, is 70 to 75% . The left ventricle has hyperdynamic function. The left ventricle  has no regional wall motion abnormalities. There is mild concentric left ventricular hypertrophy. Left ventricular diastolic parameters are consistent with Grade I diastolic dysfunction ( impaired relaxation) . The average left ventricular global longitudinal strain is - 26. 1 % . The global longitudinal strain is normal. 3. Right ventricular systolic function is normal. The right ventricular size is normal. There is mildly  elevated pulmonary artery systolic pressure. The estimated right ventricular systolic pressure is 36. 9 mmHg. 4. The mitral valve is grossly normal. Mild mitral valve regurgitation. No evidence of mitral stenosis. 5. The inferior vena cava is normal in size with < 50% respiratory variability, suggesting right atrial pressure of 8 mmHg.   REVIEW OF SYSTEMS: Constitutional: No fevers, chills, sweats, or change in appetite Eyes: No visual changes, double vision, eye pain Ear, nose and throat: No hearing loss, ear pain, nasal congestion, sore throat Cardiovascular: No chest pain, palpitations Respiratory:  No shortness of breath at rest or with exertion.   No wheezes GastrointestinaI: No nausea, vomiting, diarrhea, abdominal pain, fecal incontinence Genitourinary:  No dysuria, urinary retention or frequency.  No nocturia. Musculoskeletal:  No neck pain, back pain Integumentary: No rash, pruritus, skin lesions Neurological: as above Psychiatric: No depression at this time.  No anxiety Endocrine: No palpitations, diaphoresis, change in appetite, change in weigh or increased thirst Hematologic/Lymphatic:  No anemia, purpura, petechiae. Allergic/Immunologic: No itchy/runny eyes, nasal congestion, recent allergic reactions, rashes  ALLERGIES: Allergies  Allergen Reactions   Lamisil [Terbinafine]     Elevated liver enzymes (LFTs)    HOME MEDICATIONS:  Current Outpatient Medications:    acetaminophen  (TYLENOL ) 500 MG tablet, Take 1,000 mg by mouth every 8 (eight) hours as needed  for moderate pain (pain score 4-6) or headache., Disp: , Rfl:    amLODipine  (NORVASC ) 10 MG tablet, TAKE 1 TABLET(10 MG) BY MOUTH DAILY, Disp: 90 tablet, Rfl: 3   aspirin  EC 81 MG tablet, Take 81 mg by mouth every evening., Disp: , Rfl:    atorvastatin (LIPITOR) 80 MG tablet, Take 1 tablet by mouth at bedtime., Disp: , Rfl:    Calcium Citrate-Vitamin D (CITRACAL + D PO), Take 1 tablet by mouth in the morning and at bedtime., Disp: , Rfl:    chlorthalidone  (HYGROTON ) 25 MG tablet, TAKE 1/2 TABLET BY MOUTH DAILY, Disp: 45 tablet, Rfl: 2   Cholecalciferol (DIALYVITE VITAMIN D 5000) 125 MCG (5000 UT) capsule, Take 5,000 Units by mouth every evening., Disp: , Rfl:    donepezil  (ARICEPT ) 10 MG tablet, TAKE 1 TABLET(10 MG) BY MOUTH AT BEDTIME, Disp: 90 tablet, Rfl: 4   Doxepin HCl 3 MG TABS, Take 1 tablet by mouth at bedtime as needed (sleep)., Disp: , Rfl:    ELIQUIS  5 MG TABS tablet, TAKE 1 TABLET(5 MG) BY MOUTH TWICE DAILY, Disp: 60 tablet, Rfl: 5   levETIRAcetam  (KEPPRA ) 500 MG tablet, One po qAM and two po qHS, Disp: 270 tablet, Rfl: 3   MELATONIN PO, Take by mouth at bedtime as needed (sleep)., Disp: , Rfl:    Omega-3 1000 MG CAPS, Take 1 capsule by mouth daily., Disp: , Rfl:    potassium chloride  SA (KLOR-CON  M) 20 MEQ tablet, Take 1 tablet (20 mEq total) by mouth 2 (two) times daily., Disp: 30 tablet, Rfl: 5   sertraline  (ZOLOFT ) 50 MG tablet, TAKE 1 TABLET(50 MG) BY MOUTH DAILY, Disp: 30 tablet, Rfl: 3  PAST MEDICAL HISTORY: Past Medical History:  Diagnosis Date   Aortic stenosis    Benign prostatic hyperplasia    Bronchitis    Cataract    bilateral    CHF (congestive heart failure) (HCC)    Colon polyps    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   Heart murmur    History of fracture of vertebral column    Hyperlipidemia    Kidney stone    Nonrheumatic aortic valve stenosis  Osteoporosis    Osteoporosis    Pure hypercholesterolemia    RBBB    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   21 mm Edwards Inspiris Resilia stented bovine pericardial tissue valve via right mini thoracotomy approach   Sensorineural hearing loss of both ears    Snoring 03/20/2021   [facility] Sleep Study Feb 2024: normal   Thrombosed hemorrhoids    hsitory of   Tubular adenoma of colon    Vitamin D deficiency     PAST SURGICAL HISTORY: Past Surgical History:  Procedure Laterality Date   AORTIC VALVE REPLACEMENT N/A 07/23/2019   Procedure: MINIMALLY INVASIVE AORTIC VALVE REPLACEMENT (AVR) using Lacey Pian Resilia 21 MM Aortic Valve.;  Surgeon: Gardenia Jump, MD;  Location: [facility] OR;  Service: Open Heart Surgery;  Laterality: N/A;   ATRIAL FIBRILLATION ABLATION N/A 03/15/2020   Procedure: ATRIAL FIBRILLATION ABLATION;  Surgeon: Jolly Needle, MD;  Location: [facility] INVASIVE CV LAB;  Service: Cardiovascular;  Laterality: N/A;   CARDIAC CATHETERIZATION     COLON SURGERY  2006   1 foot ascending colon removed    COLONOSCOPY     HERNIA REPAIR     PACEMAKER IMPLANT N/A 07/29/2019   Procedure: PACEMAKER IMPLANT;  Surgeon: Jolly Needle, MD;  Location: [facility] INVASIVE CV LAB;  Service: Cardiovascular;  Laterality: N/A;   POLYPECTOMY     RIGHT HEART CATH AND CORONARY ANGIOGRAPHY N/A 06/08/2019   Procedure: RIGHT HEART CATH AND CORONARY ANGIOGRAPHY;  Surgeon: Arnoldo Lapping, MD;  [facility] IN[facility] CV LAB;  Service: Cardiovascular;  Laterality: N/A;   TEE WITHOUT CARDIOVERSION N/A 07/23/2019   Procedure: TRANSESOPHAGEAL ECHOCARDIOGRAM (TEE);  Surgeon: Gardenia Jump, MD;  Location: [facility] OR;  Service: Open Heart Surgery;  Laterality: N/A;   TESTICLE SURGERY      FAMILY HISTORY: Family History  Problem Relation Age of Onset   Heart disease Maternal Uncle    Hypertension Brother    Colon cancer Neg Hx    Colon polyps Neg Hx    Esophageal cancer Neg Hx    Rectal cancer Neg Hx    Stomach cancer Neg Hx     SOCIAL HISTORY:  Social History    Socioeconomic History   Marital status: Single    Spouse name: Not on file   Number of children: Not on file   Years of education: Not on file   Highest education level: Professional school degree (e.g., MD, DDS, DVM, JD)  Occupational History   Occupation: Retired Education officer, community  Tobacco Use   Smoking status: Never   Smokeless tobacco: Never   Tobacco comments:    Never smoke 11/06/21  Vaping Use   Vaping status: Never Used  Substance and Sexual Activity   Alcohol use: Not Currently    Comment: drinks very little wine   Drug use: No   Sexual activity: Not on file  Other Topics Concern   Not on file  Social History Narrative   Lives alone, currently has a friend living with him til around April   Left handed   Caffeine: 5 cups of coffee in the AM   Social Drivers of Health   Financial Resource Strain: Low Risk  (11/21/2020)   [facility] from [facility] visits prior to 05/25/2022., [facility] visits prior to 05/25/2022.   Overall Financial Resource Strain (CARDIA)    Difficulty of Paying Living Expenses: Not hard at all  Food Insecurity: Low Risk  (03/12/2023)  [facility]   Hunger Vital Sign    Worried About Running Out of Food in the Last Year: Never true    Ran Out of Food in the Last Year: Never true  Transportation Needs: No Transportation Needs (03/12/2023)   Received from [employer]    In the past 12 months, has lack of reliable transportation kept you from medical appointments, meetings, work or from getting things needed for daily living? : No  Physical Activity: Sufficiently Active (11/21/2020)   Received from [facility] visits prior to 05/25/2022., [facility] visits prior to 05/25/2022.   Exercise Vital Sign    Days of Exercise per Week: 4 days    Minutes of Exercise per Session: 40 min  Stress: No Stress Concern Present (11/21/2020)   Received  from [facility] visits prior to 05/25/2022., [facility] visits prior to 05/25/2022.   [employer] of Occupational Health - Occupational Stress Questionnaire    Feeling of Stress : Only a little  Social Connections: Moderately Isolated (11/21/2020)   Received from [facility] visits prior to 05/25/2022., [facility] visits prior to 05/25/2022.   Social Advertising account executive [NHANES]    Frequency of Communication with Friends and Family: More than three times a week    Frequency of Social Gatherings with Friends and Family: More than three times a week    Attends Religious Services: Never    Database administrator or Organizations: Yes    Attends Engineer, structural: More than 4 times per year    Marital Status: Widowed  Intimate Partner Violence: Not At Risk (11/21/2020)   Received from [facility] visits prior to 05/25/2022., [facility] visits prior to 05/25/2022.   Humiliation, Afraid, Rape, and Kick questionnaire    Fear of Current or Ex-Partner: No    Emotionally Abused: No    Physically Abused: No    Sexually Abused: No     PHYSICAL EXAM  Vitals:   07/29/23 1359  BP: 116/66  Pulse: 68  Weight: 171 lb (77.6 kg)  Height: 5' 9.5" (1.765 m)    Body mass index is 24.89 kg/m.   General: The patient is well-developed and well-nourished and in no acute distress  HEENT:  Head is [place]/AT.  Sclera are anicteric.    Neck: Good range of motion.  Skin: Extremities are without rash.  He has mild edema at the ankles.  Neurologic Exam  Mental status: The patient is alert and oriented x 3 at the time of the examination.  Details of the [facility] cognitive assessment are above. Scored 28/30 (normal).  Cranial nerves: Extraocular movements are full.  Facial strength and sensation was normal.  The tongue is midline, and the patient has symmetric  elevation of the soft palate. No obvious hearing deficits are noted.  Motor:  Muscle bulk is normal.   Tone is normal. Strength is  5 / 5 in all 4 extremities.   Sensory: Normal sensation to touch and temperature in the arms and legs.  Coordination: Cerebellar testing reveals good finger-nose-finger and heel-to-shin bilaterally.  Gait and station: Station is normal.    Gait is pretty normal.  Tandem gait is mildly wide.. Romberg is negative.   Reflexes: Deep tendon reflexes are symmetric and normal bilaterally.   Plantar responses are flexor.  DIAGNOSTIC DATA (LABS, IMAGING, TESTING) - I reviewed patient records, labs, notes, testing and imaging myself where available.  Lab Results  Component Value Date   WBC 9.5 03/14/2023   HGB 14.9 03/14/2023   HCT 43.6 03/14/2023   MCV 90.5 03/14/2023   PLT 143 (L) 03/14/2023      Component Value Date/Time   NA 136 03/14/2023 0540   NA 139 08/15/2021 1455   K 3.2 (L) 03/14/2023 0540   CL 103 03/14/2023 0540   CO2 22 03/14/2023 0540   GLUCOSE 145 (H) 03/14/2023 0540   BUN 24 (H) 03/14/2023 0540   BUN 24 08/15/2021 1455   CREATININE 1.05 03/14/2023 0540   CALCIUM 9.7 03/14/2023 0540   PROT 6.4 (L) 03/14/2023 0540   PROT 6.7 09/07/2019 0931   ALBUMIN  3.4 (L) 03/14/2023 0540   ALBUMIN  4.0 09/07/2019 0931   AST 35 03/14/2023 0540   ALT 29 03/14/2023 0540   ALKPHOS 50 03/14/2023 0540   BILITOT 0.6 03/14/2023 0540   BILITOT 0.4 09/07/2019 0931   GFRNONAA >60 03/14/2023 0540   GFRAA 99 02/22/2020 1527   Lab Results  Component Value Date   HGBA1C 5.7 (H) 07/19/2019   No results found for: "VITAMINB12" Lab Results  Component Value Date   TSH 0.618 09/06/2022       ASSESSMENT AND PLAN  Alzheimer's disease (HCC)  Seizure disorder (HCC)  Persistent atrial fibrillation (HCC)  Mild cognitive impairment  OSA (obstructive sleep apnea)   He has mild cognitive impairment associated with Alzheimer's disease (PET positive)  we had discussed lecanemab and donanamemab.  He is concerned about the frequent infusions and potential side effects we will hold off at this time but read more about these and discussed with family/friends.    Continue donepezil .  He opted not to do lecanemab Continue levetiracetam .. He has OSA and is tolerating CPAP.  He has not yet noted any significant difference on treatment versus before treatment.  We discussed compliance Stay active and exercise as tolerated.  He will return to see us  in 12 months or sooner if there are new or worsening neurologic symptoms.   This visit is part of a comprehensive longitudinal care medical relationship regarding the patients primary diagnosis of Alzheimer's disease and related concerns.  Tylee Newby A. Godwin Lat, MD, [facility] 07/29/2023, 2:39 PM Certified in Neurology, Clinical Neurophysiology, Sleep Medicine and Neuroimaging  [facility] [address] [place], [place] [address] [phone]

## 2023-07-31 ENCOUNTER — Telehealth: Payer: Self-pay | Admitting: Cardiovascular Disease

## 2023-07-31 DIAGNOSIS — I10 Essential (primary) hypertension: Secondary | ICD-10-CM

## 2023-07-31 MED ORDER — AMLODIPINE BESYLATE 10 MG PO TABS: 10.0000 mg | ORAL_TABLET | Freq: Every day | ORAL | 1 refills | Status: DC

## 2023-07-31 NOTE — Telephone Encounter (Signed)
*  STAT* If patient is at the pharmacy, call can be transferred to refill team.   1. Which medications need to be refilled? (please list name of each medication and dose if known) amLODipine  (NORVASC ) 10 MG tablet    2. Would you like to learn more about the convenience, safety, & potential cost savings by using the [facility]? No   3. Are you open to using the [facility] (Type [facility].) No   4. Which pharmacy/location (including street and city if local pharmacy) is medication to be sent to?  [facility] - [place], [place] - [address] Phone: [phone]  Fax: [phone]    5. Do they need a 30 day or 90 day supply? 90 day

## 2023-07-31 NOTE — Telephone Encounter (Signed)
 Pt's medication was sent to pt's pharmacy as requested. Confirmation received.

## 2023-08-01 ENCOUNTER — Ambulatory Visit (INDEPENDENT_AMBULATORY_CARE_PROVIDER_SITE_OTHER): Payer: Medicare Other

## 2023-08-01 DIAGNOSIS — I442 Atrioventricular block, complete: Secondary | ICD-10-CM | POA: Diagnosis not present

## 2023-08-01 LAB — CUP PACEART REMOTE DEVICE CHECK
Battery Remaining Longevity: 87 mo
Battery Remaining Percentage: 67 %
Battery Voltage: 3.01 V
Brady Statistic AP VP Percent: 1 %
Brady Statistic AP VS Percent: 36 %
Brady Statistic AS VP Percent: 1 %
Brady Statistic AS VS Percent: 62 %
Brady Statistic RA Percent Paced: 34 %
Brady Statistic RV Percent Paced: 1.5 %
Date Time Interrogation Session: 20250509020016
Implantable Lead Connection Status: 753985
Implantable Lead Connection Status: 753985
Implantable Lead Implant Date: 20210506
Implantable Lead Implant Date: 20210506
Implantable Lead Location: 753859
Implantable Lead Location: 753860
Implantable Pulse Generator Implant Date: 20210506
Lead Channel Impedance Value: 410 Ohm
Lead Channel Impedance Value: 440 Ohm
Lead Channel Pacing Threshold Amplitude: 0.625 V
Lead Channel Pacing Threshold Amplitude: 1 V
Lead Channel Pacing Threshold Pulse Width: 0.4 ms
Lead Channel Pacing Threshold Pulse Width: 0.4 ms
Lead Channel Sensing Intrinsic Amplitude: 1.2 mV
Lead Channel Sensing Intrinsic Amplitude: 4.8 mV
Lead Channel Setting Pacing Amplitude: 1.25 V
Lead Channel Setting Pacing Amplitude: 1.625
Lead Channel Setting Pacing Pulse Width: 0.4 ms
Lead Channel Setting Sensing Sensitivity: 0.5 mV
Pulse Gen Model: 2272
Pulse Gen Serial Number: 3825759

## 2023-08-02 ENCOUNTER — Other Ambulatory Visit: Payer: Self-pay | Admitting: Cardiovascular Disease

## 2023-08-02 DIAGNOSIS — I10 Essential (primary) hypertension: Secondary | ICD-10-CM

## 2023-08-03 ENCOUNTER — Encounter: Payer: Self-pay | Admitting: Cardiology

## 2023-08-13 ENCOUNTER — Encounter: Payer: Self-pay | Admitting: Neurology

## 2023-09-05 NOTE — Progress Notes (Signed)
 Remote pacemaker transmission.

## 2023-09-24 ENCOUNTER — Telehealth: Payer: Self-pay | Admitting: Cardiovascular Disease

## 2023-09-24 DIAGNOSIS — I4891 Unspecified atrial fibrillation: Secondary | ICD-10-CM

## 2023-09-24 MED ORDER — APIXABAN 5 MG PO TABS: 5.0000 mg | ORAL_TABLET | Freq: Two times a day (BID) | ORAL | 1 refills | Status: DC

## 2023-09-24 NOTE — Telephone Encounter (Signed)
*  STAT* If patient is at the pharmacy, call can be transferred to refill team.   1. Which medications need to be refilled? (please list name of each medication and dose if known)    ELIQUIS  5 MG TABS tablet    2. Which pharmacy/location (including street and city if local pharmacy) is medication to be sent to?  [id number] - [place], [place] - [address]      3. Do they need a 30 day or 90 day supply? 90 day    Pt is completely out of medication

## 2023-09-24 NOTE — Telephone Encounter (Signed)
 Prescription refill request for Eliquis  received. Indication: PAF Last office visit: 05/26/23  Jerry Holts MD Scr: 1.05 on 03/14/23  Epic Age: 73 Weight: 78.5kg  Based on above findings Eliquis  5mg  twice daily is the appropriate dose.   Refill approved.

## 2023-10-08 ENCOUNTER — Ambulatory Visit: Payer: Medicare Other | Admitting: Neurology

## 2023-10-28 ENCOUNTER — Other Ambulatory Visit: Payer: Self-pay | Admitting: Neurology

## 2023-10-28 NOTE — Telephone Encounter (Signed)
 Last seen on 07/29/23 Follow up scheduled on 07/29/24   I didn't see in recent note you wanted to to continue Rx.   Rx is pending

## 2023-10-31 ENCOUNTER — Ambulatory Visit (INDEPENDENT_AMBULATORY_CARE_PROVIDER_SITE_OTHER): Payer: Medicare Other

## 2023-10-31 DIAGNOSIS — I442 Atrioventricular block, complete: Secondary | ICD-10-CM | POA: Diagnosis not present

## 2023-11-03 LAB — CUP PACEART REMOTE DEVICE CHECK
Battery Remaining Longevity: 82 mo
Battery Remaining Percentage: 65 %
Battery Voltage: 3.01 V
Brady Statistic AP VP Percent: 6 %
Brady Statistic AP VS Percent: 38 %
Brady Statistic AS VP Percent: 1 %
Brady Statistic AS VS Percent: 55 %
Brady Statistic RA Percent Paced: 41 %
Brady Statistic RV Percent Paced: 6.8 %
Date Time Interrogation Session: 20250811010603
Implantable Lead Connection Status: 753985
Implantable Lead Connection Status: 753985
Implantable Lead Implant Date: 20210506
Implantable Lead Implant Date: 20210506
Implantable Lead Location: 753859
Implantable Lead Location: 753860
Implantable Pulse Generator Implant Date: 20210506
Lead Channel Impedance Value: 430 Ohm
Lead Channel Impedance Value: 440 Ohm
Lead Channel Pacing Threshold Amplitude: 0.5 V
Lead Channel Pacing Threshold Amplitude: 1.125 V
Lead Channel Pacing Threshold Pulse Width: 0.4 ms
Lead Channel Pacing Threshold Pulse Width: 0.4 ms
Lead Channel Sensing Intrinsic Amplitude: 2.5 mV
Lead Channel Sensing Intrinsic Amplitude: 3.8 mV
Lead Channel Setting Pacing Amplitude: 1.375
Lead Channel Setting Pacing Amplitude: 1.5 V
Lead Channel Setting Pacing Pulse Width: 0.4 ms
Lead Channel Setting Sensing Sensitivity: 0.5 mV
Pulse Gen Model: 2272
Pulse Gen Serial Number: 3825759

## 2023-11-04 ENCOUNTER — Encounter: Payer: Self-pay | Admitting: Internal Medicine

## 2023-11-04 ENCOUNTER — Ambulatory Visit: Payer: Self-pay | Admitting: Cardiology

## 2023-11-04 ENCOUNTER — Ambulatory Visit: Admitting: Internal Medicine

## 2023-11-04 VITALS — BP 120/70 | HR 63 | Temp 97.5°F | Resp 18 | Ht 69.5 in | Wt 166.2 lb

## 2023-11-04 DIAGNOSIS — L299 Pruritus, unspecified: Secondary | ICD-10-CM

## 2023-11-04 DIAGNOSIS — Z6824 Body mass index (BMI) 24.0-24.9, adult: Secondary | ICD-10-CM

## 2023-11-04 DIAGNOSIS — W57XXXA Bitten or stung by nonvenomous insect and other nonvenomous arthropods, initial encounter: Secondary | ICD-10-CM | POA: Insufficient documentation

## 2023-11-04 DIAGNOSIS — R21 Rash and other nonspecific skin eruption: Secondary | ICD-10-CM

## 2023-11-04 DIAGNOSIS — S40022A Contusion of left upper arm, initial encounter: Secondary | ICD-10-CM | POA: Diagnosis not present

## 2023-11-04 MED ORDER — PERMETHRIN 1 % EX LIQD
CUTANEOUS | 1 refills | Status: DC
Start: 2023-11-04 — End: 2023-11-19

## 2023-11-04 MED ORDER — TRIAMCINOLONE ACETONIDE 0.5 % EX OINT: 1.0000 | TOPICAL_OINTMENT | Freq: Two times a day (BID) | CUTANEOUS | 0 refills | Status: DC

## 2023-11-04 NOTE — Progress Notes (Signed)
   Acute Office Visit  Subjective:     Patient ID: Jerry Foster, male    DOB: Nov 24, 1950, 73 y.o.   MRN: [id number]  Chief Complaint  Patient presents with   office visit    Establishing for acute care only, Vesicles on his ankles and upper thigh    HPI Patient is in today for Rash in his both legs started 4 days ago.  He says that it itches a lot.  No pain.  No fever or chills.  He says that he has slept another place and since then he has a rash that a discrete blister-like in his legs.  No fever or chills.  No pain.    He also noted little bruise on his left forearm.  He is on blood thinner.  He still has a swelling in his left forearm.    Review of Systems  Musculoskeletal:         Healing bruise left forearm  Skin:  Positive for itching and rash.        Objective:    BP 120/70   Pulse 63   Temp (!) 97.5 F (36.4 C)   Resp 18   Ht 5' 9.5 (1.765 m)   Wt 166 lb 4 oz (75.4 kg)   SpO2 95%   BMI 24.20 kg/m    Physical Exam Skin:    Findings: Lesion and rash present.     Comments: Both legs multiple places.   Neurological:     Mental Status: He is alert.     No results found for any visits on 11/04/23.      Assessment & Plan:   Problem List Items Addressed This Visit       Other   Bug bite - Primary    He will wash all his limbs in hot water and apply permethrin  lotion from neck down and wash in the morning.  He will apply triamcinolone  cream for itching.      Arm bruise, left, initial encounter     He has a healing bruise in his left forearm Ms. Still has hematoma underneath.  If swelling persist he will need ultrasound.       No orders of the defined types were placed in this encounter.   No follow-ups on file.  Roetta Dare, MD

## 2023-11-04 NOTE — Assessment & Plan Note (Signed)
 He has a healing bruise in his left forearm Ms. Still has hematoma underneath.  If swelling persist he will need ultrasound.

## 2023-11-04 NOTE — Assessment & Plan Note (Signed)
 He will wash all his limbs in hot water and apply permethrin  lotion from neck down and wash in the morning.  He will apply triamcinolone  cream for itching.

## 2023-11-10 NOTE — Progress Notes (Unsigned)
 Cardiology Office Note:    Date:  11/10/2023   ID:  Jerry Foster, DOB 05/01/1950, MRN [id number]  PCP:  Jerry Sharper, MD  Cardiologist:  Foster Fell, MD Electrophysiologist:  Jerry ONEIDA HOLTS, MD { Click to update primary MD,subspecialty MD or APP then REFRESH:1}    Referring MD: Jerry Sharper, MD   Chief Complaint: follow-up of CAD and aortic stenosis  History of Present Illness:    Jerry Foster is a 73 y.o. male with a history of mild non-obstructive CAD on cardiac catheterization in 05/2019, severe aortic stenosis s/p minimal invasive bioprosthetic AVR in 06/2019, persistent atrial fibrillation/ flutter s/p ablation in 02/2020 on Eliquis , Mobtiz II AV block s/p PPM in 07/2019, viral pericarditis in 11/2019, mild bilateral carotid stenosis, hypertension,  hyperlipidemia, BPH, and Alzheimer's disease who is followed by Dr. Fell and Dr. HOLTS and presents today for routine follow-up.      Mild Non-Obstructive CAD - R/ LHC in 05/2019 for work-up of aortic stenosis showed mild non-obstructive CAD with 25% stenosis of proximal to mid LAD and 30% stenosis of ostial RCA.   Severe Aortic Stenosis - S/p minimal invasive bioprosthetic AVR on 07/23/2019 with Dr. Dusty. Post-op complicated by atrial fibrillation and high grade AV blocker resulting in a fall with laceration to left eye and C6 avulsion fracture. S/p DCCV and placement of PPM on 07/29/2023. - Last Echo in 09/2022 showed LVEF of 70-75% with grade 1 diastolic dysfunction and normal structure and function of aortic valve prothesis. Mean gradient  up to 18 mmHg.   Persistent Atrial Fibrillation/ Flutter - Initially diagnosed with atrial fibrillation  in 07/2019 following AVR. Subsequently started on Amiodarone  and converted to sinus rhythm but had recurrent atrial fibrillation after this was stopped.  - Diagnosed with atrial flutter in 01/2020.  - S/p atrial fibrillation and flutter ablation on 03/15/2020 with Dr. Kelsie.  - Remains  on anticoagulation with Eliquis .   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 S/p St Jude dual chamber PPM on 07/29/2019.    Viral Pericarditis - Diagnosed in 11/2019 after recent viral pneumonia. Treated with Colchicine  and NSIADs.   Carotid Stenosis - Pre CABG dopplers in 06/2019 showed 1-30% stenosis of bilateral ICAs.   - Has not had any repeat imagining.       He was last seen by Dr. HOLTS in 05/2023 at which time his brother reported he had been experiencing worsening memory and dementia. Recent device check in 04/2023 showed high ventricular rate that appeared to be atrial fibrillation with RVR. Dr. HOLTS suspected that he had a higher burden of atrial fibrillation/ flutter than his device suggests. He expressed that he would like to avoid long-term anticoagulation if possible. They discussed the Watchman procedure but patient wanted to think over this with his family.   Patient presents today for follow-up. ***  Mild Non-Obstructive CAD Noted on cardiac catheterization in 05/2019.  - No chest pain.  - Continue Aspirin  81mg  daily and Lipitor 80mg  daily.   Persistent Atrial Fibrillation/ Flutter Initially diagnosed in post-op setting after AVR but had recurrence. S/p atrial fibrillation and flutter ablation in ***.  - Maintaining sinus rhythm on exam. *** - Continue chronic anticoagulation with Eliquis  5mg  twice daily.  - Dr. HOLTS discussed Watchman procedure with him at last visit in 05/2023 but patient wanted to think about this more with family. ***  Severe Aortic Stenosis s/p AVR S/p minimally invasive bioprosthetic AVR in 06/2019. Last  Echo in 09/2022 showed normal structure and function of aortic valve prosthesis (mean gradient 18 mmHg) - Continue SBE prophylaxis prior to dental procedures.   Mild Mitral Regurgitation Noted on Echo in 09/2022.  - Continue routine monitoring with serial Echos per guidelines.   High Grade AV Block  s/p PPM Developed symptomatic Mobitz II 2nd degree AV block following AVR. S/p dual chamber St Jude PPM in 07/2019.  - Managed by Dr. Cindie.  Bilateral Carotid Stenosis Pre-CABG dopplers in 06/2019 showed mild stenosis (1-39%) of bilateral ICAs.  - Continue Aspirin  and Lipitor.   Hypertension BP well controlled. *** - Continue current medications: Amlodipine  10mg  daily and Chlorthalidone  25mg  daily.   Hyperlipidemia Lipid panel in 05/2023:Total Cholesterol 127, Triglycerides 55, HDL 60, LDL 53. LDL goal <70.  - Continue Lipitor 80mg  daily.     EKGs/Labs/Other Studies Reviewed:    The following studies were reviewed:  Right/ Left Cardiac Catheterization 06/08/2019: 1. Mild nonobstructive CAD without any high-grade or flow-limiting stenoses 2. Bulky aortic valve calcification with restricted leaflet mobility 3. Normal right heart pressures/preserved cardiac output   Plan: CTA studies, cardiac surgical evaluation for consideration of aortic valve replacement  Diagnostic Dominance: Right   _______________  Pre-CABG Dopplers 07/19/2019: Summary:  Right Carotid: Velocities in the right ICA are consistent with a 1-39%  stenosis.   Left Carotid: Velocities in the left ICA are consistent with a 1-39%  stenosis.  Vertebrals: Bilateral vertebral arteries demonstrate antegrade flow.   Right Upper Extremity: Doppler waveforms remain within normal limits with  right radial compression. Doppler waveform obliterate with right ulnar  compression.  Left Upper Extremity: Doppler waveforms remain within normal limits with  left radial compression. Doppler waveform obliterate with left ulnar  compression.  _______________  Echocardiogram 10/22/2022: Impressions:  1. 21 mm AoV bioprosthesis. MG up to 18 mmHG on this study, but remains  within limits. The aortic valve has been repaired/replaced. Aortic valve  regurgitation is not visualized. There is a 21 mm bioprosthetic valve  present  in the aortic position.  Procedure Date: 07/23/2019. Echo findings are consistent with normal  structure and function of the aortic valve prosthesis. Aortic valve area,  by VTI measures 1.00 cm. Aortic valve mean gradient measures 18.0 mmHg.  Aortic valve Vmax measures 2.91 m/s.   2. Left ventricular ejection fraction, by estimation, is 70 to 75%. The  left ventricle has hyperdynamic function. The left ventricle has no  regional wall motion abnormalities. There is mild concentric left  ventricular hypertrophy. Left ventricular  diastolic parameters are consistent with Grade I diastolic dysfunction  (impaired relaxation). The average left ventricular global longitudinal  strain is -26.1 %. The global longitudinal strain is normal.   3. Right ventricular systolic function is normal. The right ventricular  size is normal. There is mildly elevated pulmonary artery systolic  pressure. The estimated right ventricular systolic pressure is 36.9 mmHg.   4. The mitral valve is grossly normal. Mild mitral valve regurgitation.  No evidence of mitral stenosis.   5. The inferior vena cava is normal in size with <50% respiratory  variability, suggesting right atrial pressure of 8 mmHg     EKG:  EKG not ordered today.   Recent Labs: 03/14/2023: ALT 29; BUN 24; Creatinine, Ser 1.05; Hemoglobin 14.9; Platelets 143; Potassium 3.2; Sodium 136  Recent Lipid Panel No results found for: CHOL, TRIG, HDL, CHOLHDL, VLDL, LDLCALC, LDLDIRECT  Physical Exam:    Vital Signs: There were no vitals taken for this visit.  Wt Readings from Last 3 Encounters:  11/04/23 166 lb 4 oz (75.4 kg)  07/29/23 171 lb (77.6 kg)  05/26/23 173 lb (78.5 kg)     General: 73 y.o. male in no acute distress. HEENT: Normocephalic and atraumatic. Sclera clear.  Neck: Supple. No carotid bruits. No JVD. Heart: *** RRR. Distinct S1 and S2. No murmurs, gallops, or rubs.  Lungs: No increased work of breathing. Clear  to ausculation bilaterally. No wheezes, rhonchi, or rales.  Abdomen: Soft, non-distended, and non-tender to palpation.  Extremities: No lower extremity edema.  Radial and distal pedal pulses 2+ and equal bilaterally. Skin: Warm and dry. Neuro: No focal deficits. Psych: Normal affect. Responds appropriately.   Assessment:    No diagnosis found.  Plan:     Disposition: Follow up in ***   Signed, Chanel Mcadams E Tauriel Scronce, PA-C  11/10/2023 8:15 PM    [facility]

## 2023-11-18 ENCOUNTER — Ambulatory Visit: Admitting: Student

## 2023-11-18 NOTE — Progress Notes (Deleted)
 Cardiology Office Note:  .   Date:  11/18/2023  ID:  Jerry Foster, DOB 27-Jul-1950, MRN [id number] PCP: Millicent Sharper, MD  [facility] Providers Cardiologist:  Sharper Fell, MD Electrophysiologist:  OLE ONEIDA HOLTS, MD { ]   History of Present Illness: .   Jerry Foster is a 73 y.o. male  with PMHx of Aortic stenosis s/p min inv bioprosthetic AVR 06/2019 associated with post op afib and high grade HB, CAD (Cath 05/2019: Mild, non-obs dz), persistent afib s/p AFlutter ablation and PVI ablation for AFib in 02/2020 ([facility]), Mobitz II AV block s/p pacemaker in 07/2019, HLD, RBBB, pericarditis (11/2019), Mild bilateral carotid stenosis (carotid US  06/2019), aortic atherosclerosis, alzheimer's who reports to [facility] office for follow up.   Last seen in heartcare  EP OV 05/2023 with Dr. HOLTS for follow accompanied by his partner. Doing well from cardiac standpoint without any cardiac complaints. Noted some progressive memory impairment, expressive aphasia, and generalized fatigue. Noted that ASA was added to regimen due to concern for TIA by neurology.  He is on amlodipine  10 mg daily, Eliquis  5 mg twice daily, KCl 20 mEq twice daily, and pravastatin  40 mg daily.   Today, reports ### and denies ###.  Reports compliance with medications.  Dietary habitats:  Activity level:  Social: Denies tobacco use/alcohol/drug use  Denies any recent hospitalizations or visits to the emergency department.   Persistent atrial fibrillation (HCC) Hypercoagulable state due to persistent atrial fibrillation (HCC) Reviewed EKG on or EKG today shows  On exam noted regular, rate and rhythm OR irregularly irregular.  Denies palpitations or active bleeding.  On AC with CBC WNL on #  Continue on    Primary hypertension Reports well controlled Home BP:  BP this OV well controlled today:  Continue on / Managed by GDMT as above.  Encourage physical activity for 150 minutes per week and heart healthy low  sodium diet. Discussed limiting sodium intake to < 2 grams daily.     Mixed hyperlipidemia LDL  Continue on   Nonrheumatic aortic valve stenosis S/P minimally-invasive aortic valve replacement with bioprosthetic valve s/p min inv bioprosthetic AVR 06/2019 associated with post op afib and high grade HB ECHO 09/2022: normal functioning AV prothesis w/o AV regurgiation.   Heart block AV complete (HCC) Cardiac pacemaker in situ Complication of AV replacement s/p pacemaker in 07/2019 Device interrogation 10/2023 showed normal functioning device with AF burden 2.9% since 05/26/2023 Continue to follow up EP.   Mild bilateral carotid stenosis  Noted on carotid US  06/2019 No caroitd bruits on exam. No syncope  Continue to monitor  Will plan for repeat carotid if bruits or presyncope/syncope  ROS: 10 point review of system has been reviewed and considered negative except ones been listed in the HPI.   Studies Reviewed: SABRA   RHC 05/2019 1. Mild nonobstructive CAD without any high-grade or flow-limiting stenoses 2. Bulky aortic valve calcification with restricted leaflet mobility 3. Normal right heart pressures/preserved cardiac output   Plan: CTA studies, cardiac surgical evaluation for consideration of aortic valve replacement  CCTA 02/2020 IMPRESSION: 1. There are 2 right pulmonary veins and the left pulmonary veinsconverge into a common trunk.   2. There is no thrombus in the left atrial appendage. An accessory left atrial appendage is present in the superior/medial aspect ofthe left atrium without thrombus.   3. The esophagus runs in close proximity to the common left pulmonary vein ostium.   4. No PFO/ASD.   5. Normal coronary origin.  Right dominance.   6. CAC score of 0.   7. A 21 mm Inspiris Resilia stented bioprosthesis is present in the aortic position with normal leaflet motion.  ECHO 09/2022 IMPRESSIONS   1. 21 mm AoV bioprosthesis. MG up to 18 mmHG on this study, but remains  within limits. The aortic valve has been repaired/replaced. Aortic valve regurgitation is not visualized. There is a 21 mm bioprosthetic valve  present in the aortic position.  Procedure Date: 07/23/2019. Echo findings are consistent with normal structure and function of the aortic valve prosthesis. Aortic valve area, by VTI measures 1.00 cm. Aortic valve mean gradient measures 18.0 mmHg.  Aortic valve Vmax measures 2.91 m/s.   2. Left ventricular ejection fraction, by estimation, is 70 to 75%. The left ventricle has hyperdynamic function. The left ventricle has no  regional wall motion abnormalities. There is mild concentric left  ventricular hypertrophy. Left ventricular  diastolic parameters are consistent with Grade I diastolic dysfunction  (impaired relaxation). The average left ventricular global longitudinal  strain is -26.1 %. The global longitudinal strain is normal.   3. Right ventricular systolic function is normal. The right ventricular  size is normal. There is mildly elevated pulmonary artery systolic pressure. The estimated right ventricular systolic pressure is 36.9 mmHg.   4. The mitral valve is grossly normal. Mild mitral valve regurgitation.  No evidence of mitral stenosis.   5. The inferior vena cava is normal in size with <50% respiratory  variability, suggesting right atrial pressure of 8 mmHg.  Risk Assessment/Calculations:   {Does this patient have ATRIAL FIBRILLATION?:[id number]} No BP recorded.  {Refresh Note OR Click here to enter BP  :1}***   STOP-Bang Score:     { Consider Dx Sleep Disordered Breathing or Sleep Apnea  ICD G47.33          :1}    Physical Exam:   VS:  There were no vitals taken for this visit.   Wt Readings from Last 3 Encounters:  11/04/23 166 lb 4 oz (75.4 kg)  07/29/23 171 lb (77.6 kg)  05/26/23 173 lb (78.5 kg)    GEN: Well nourished, well developed in no acute distress while sitting in chair.  NECK: No JVD; No carotid bruits CARDIAC: ***RRR,  no murmurs, rubs, gallops RESPIRATORY:  Clear to auscultation without rales, wheezing or rhonchi  ABDOMEN: Soft, non-tender, non-distended EXTREMITIES:  No edema; No deformity   ASSESSMENT AND PLAN: .   ***    {Are you ordering a CV Procedure (e.g. stress test, cath, DCCV, TEE, etc)?   Press F2        :[id number]}  Dispo: ***  Signed, Lorette CINDERELLA Kapur, PA-C

## 2023-11-19 ENCOUNTER — Encounter: Payer: Self-pay | Admitting: Cardiovascular Disease

## 2023-11-19 ENCOUNTER — Ambulatory Visit: Attending: Cardiovascular Disease | Admitting: Cardiovascular Disease

## 2023-11-19 ENCOUNTER — Ambulatory Visit: Admitting: Student

## 2023-11-19 VITALS — BP 114/70 | HR 60 | Ht 69.5 in | Wt 171.2 lb

## 2023-11-19 DIAGNOSIS — I4819 Other persistent atrial fibrillation: Secondary | ICD-10-CM

## 2023-11-19 DIAGNOSIS — I442 Atrioventricular block, complete: Secondary | ICD-10-CM

## 2023-11-19 DIAGNOSIS — I350 Nonrheumatic aortic (valve) stenosis: Secondary | ICD-10-CM | POA: Diagnosis not present

## 2023-11-19 DIAGNOSIS — I10 Essential (primary) hypertension: Secondary | ICD-10-CM | POA: Diagnosis not present

## 2023-11-19 DIAGNOSIS — E782 Mixed hyperlipidemia: Secondary | ICD-10-CM

## 2023-11-19 DIAGNOSIS — Z953 Presence of xenogenic heart valve: Secondary | ICD-10-CM

## 2023-11-19 DIAGNOSIS — Z950 Presence of cardiac pacemaker: Secondary | ICD-10-CM

## 2023-11-19 NOTE — Progress Notes (Signed)
 Cardiology Office Note:    Date:  11/19/2023   ID:  Jerry Foster, DOB 1951/03/14, MRN [id number]  PCP:  Millicent Sharper, MD   [facility] Providers Cardiologist:  Sharper Fell, MD Electrophysiologist:  OLE ONEIDA HOLTS, MD     Referring MD: Millicent Sharper, MD   Chief Complaint  Patient presents with   Follow-up    Aortic Valve Disease    History of Present Illness:    Jerry Foster is a 73 y.o. male with a hx of:  Aortic stenosis S/p min inv bioprosthetic AVR 06/2019 Post op c/b AFib and high grade HB >> fall with lac to L eyebrow, C6 avulsion fx >> pacemaker Limited TTE 12/08/19: EF 60-65, no RWMA, moderate LVH, GR 1 DD, normal RVSF, normally functioning AVR with mean gradient 15 mmHg Coronary artery disease  Mild, non-obs dz by cath 06/08/2019 Persistent atrial fibrillation  Post op >> recurrent after DC of Amiodarone    Isthmus dependent AFlutter S/p AFlutter ablation and PVI ablation for AFib in 02/2020 ([facility]) Mobitz II AV block S/p Pacemaker  Hyperlipidemia RBBB Pericarditis (11/2019) Carotid US  07/19/19: Bilateral ICA 1-39 Aortic atherosclerosis  Alzheimer's Dementia  The patient is here alone today.  He states that he is doing fine from a cardiac perspective and he denies chest pain, chest pressure, shortness of breath, or leg swelling.  He said no lightheadedness or heart palpitations.  He is compliant with his medications.  His main issue over the past year or 2 is progressive memory decline.  Patient been diagnosed with dementia and is on Aricept .  He has difficulty especially with short-term memory.  He states that he took it over a year because he has trouble navigating directions now.  Current Medications: Current Meds  Medication Sig   amLODipine  (NORVASC ) 10 MG tablet Take 1 tablet (10 mg total) by mouth daily.   apixaban  (ELIQUIS ) 5 MG TABS tablet Take 1 tablet (5 mg total) by mouth 2 (two) times daily.   aspirin  EC 81 MG tablet Take 81 mg by  mouth every evening.   atorvastatin (LIPITOR) 40 MG tablet Take 40 mg by mouth daily.   Calcium Citrate-Vitamin D (CITRACAL + D PO) Take 1 tablet by mouth in the morning and at bedtime.   chlorthalidone  (HYGROTON ) 25 MG tablet TAKE 1/2 TABLET BY MOUTH DAILY (Patient taking differently: Take 25 mg by mouth daily. Take 1/2 tablet by mouth daily.)   Cholecalciferol (DIALYVITE VITAMIN D 5000) 125 MCG (5000 UT) capsule Take 5,000 Units by mouth every evening.   donepezil  (ARICEPT ) 10 MG tablet TAKE 1 TABLET(10 MG) BY MOUTH AT BEDTIME   Doxepin HCl 3 MG TABS Take 1 tablet by mouth at bedtime as needed (sleep).   MELATONIN PO Take by mouth at bedtime as needed (sleep).   Omega-3 1000 MG CAPS Take 1 capsule by mouth daily.   potassium chloride  SA (KLOR-CON  M) 20 MEQ tablet Take 1 tablet (20 mEq total) by mouth 2 (two) times daily.   sertraline  (ZOLOFT ) 50 MG tablet TAKE 1 TABLET BY MOUTH ONCE DAILY   [DISCONTINUED] acetaminophen  (TYLENOL ) 500 MG tablet Take 1,000 mg by mouth every 8 (eight) hours as needed for moderate pain (pain score 4-6) or headache.     Allergies:   Lamisil [terbinafine]   ROS:   Please see the history of present illness.    All other systems reviewed and are negative.  EKGs/Labs/Other Studies Reviewed:    The following studies were reviewed today: Cardiac Studies &  Procedures   ______________________________________________________________________________________________ CARDIAC CATHETERIZATION  CARDIAC CATHETERIZATION 06/08/2019  Conclusion 1. Mild nonobstructive CAD without any high-grade or flow-limiting stenoses 2. Bulky aortic valve calcification with restricted leaflet mobility 3. Normal right heart pressures/preserved cardiac output  Plan: CTA studies, cardiac surgical evaluation for consideration of aortic valve replacement  Findings Coronary Findings Diagnostic  Dominance: Right  Left Anterior Descending Mid LAD lesion is 25% stenosed.  First Diagonal  Branch Large first diagonal without significant stenosis  Left Circumflex The vessel exhibits minimal luminal irregularities. The circumflex supplies a large area of myocardium. The OM branches are all patent without significant stenoses.  Right Coronary Artery Ost RCA lesion is 30% stenosed. mild nonobstructive ostial RCA stenosis  Intervention  No interventions have been documented.     ECHOCARDIOGRAM  ECHOCARDIOGRAM COMPLETE 10/22/2022  Narrative ECHOCARDIOGRAM REPORT    Patient Name:   Jerry Foster  Date of Exam: 10/22/2022 Medical Rec #:  [id number]     Height:       70.0 in Accession #:    [id number]    Weight:       168.0 lb Date of Birth:  Oct 20, 1950     BSA:          1.938 m Patient Age:    73 years      BP:           124/84 mmHg Patient Gender: M             HR:           69 bpm. Exam Location:  [address]  Procedure: 2D Echo, Cardiac Doppler, Color Doppler and Strain Analysis  Indications:    Z95.2 Status Post Aortic Valve Replacement  History:        Patient has prior history of Echocardiogram examinations, most recent 12/08/2019. CHF, Pacemaker, Aortic Valve Disease, Arrythmias:Atrial Fibrillation and RBBB, Signs/Symptoms:Murmur; Risk Factors:Dyslipidemia. Aortic Valve Replacement (2021, 21mm Edwards Inspiris Resilia Bovine Pericardial Tissue Valve). Aortic Valve: 21 mm bioprosthetic valve is present in the aortic position. Procedure Date: 07/23/2019.  Sonographer:    Heather Hawks RDCS Referring Phys: GLENDIA DASEN WEAVER  IMPRESSIONS   1. 21 mm AoV bioprosthesis. MG up to 18 mmHG on this study, but remains within limits. The aortic valve has been repaired/replaced. Aortic valve regurgitation is not visualized. There is a 21 mm bioprosthetic valve present in the aortic position. Procedure Date: 07/23/2019. Echo findings are consistent with normal structure and function of the aortic valve prosthesis. Aortic valve area, by VTI measures 1.00 cm. Aortic valve  mean gradient measures 18.0 mmHg. Aortic valve Vmax measures 2.91 m/s. 2. Left ventricular ejection fraction, by estimation, is 70 to 75%. The left ventricle has hyperdynamic function. The left ventricle has no regional wall motion abnormalities. There is mild concentric left ventricular hypertrophy. Left ventricular diastolic parameters are consistent with Grade I diastolic dysfunction (impaired relaxation). The average left ventricular global longitudinal strain is -26.1 %. The global longitudinal strain is normal. 3. Right ventricular systolic function is normal. The right ventricular size is normal. There is mildly elevated pulmonary artery systolic pressure. The estimated right ventricular systolic pressure is 36.9 mmHg. 4. The mitral valve is grossly normal. Mild mitral valve regurgitation. No evidence of mitral stenosis. 5. The inferior vena cava is normal in size with <50% respiratory variability, suggesting right atrial pressure of 8 mmHg.  FINDINGS Left Ventricle: Left ventricular ejection fraction, by estimation, is 70 to 75%. The left ventricle has hyperdynamic function. The left ventricle has  no regional wall motion abnormalities. The average left ventricular global longitudinal strain is -26.1 %. The global longitudinal strain is normal. The left ventricular internal cavity size was normal in size. There is mild concentric left ventricular hypertrophy. Left ventricular diastolic parameters are consistent with Grade I diastolic dysfunction (impaired relaxation).  Right Ventricle: The right ventricular size is normal. No increase in right ventricular wall thickness. Right ventricular systolic function is normal. There is mildly elevated pulmonary artery systolic pressure. The tricuspid regurgitant velocity is 2.69 m/s, and with an assumed right atrial pressure of 8 mmHg, the estimated right ventricular systolic pressure is 36.9 mmHg.  Left Atrium: Left atrial size was normal in  size.  Right Atrium: Right atrial size was normal in size. Prominent Eustachian valve.  Pericardium: There is no evidence of pericardial effusion.  Mitral Valve: The mitral valve is grossly normal. Mild mitral valve regurgitation. No evidence of mitral valve stenosis.  Tricuspid Valve: The tricuspid valve is grossly normal. Tricuspid valve regurgitation is trivial. No evidence of tricuspid stenosis.  Aortic Valve: 21 mm AoV bioprosthesis. MG up to 18 mmHG on this study, but remains within limits. The aortic valve has been repaired/replaced. Aortic valve regurgitation is not visualized. Aortic valve mean gradient measures 18.0 mmHg. Aortic valve peak gradient measures 33.9 mmHg. Aortic valve area, by VTI measures 1.00 cm. There is a 21 mm bioprosthetic valve present in the aortic position. Procedure Date: 07/23/2019. Echo findings are consistent with normal structure and function of the aortic valve prosthesis.  Pulmonic Valve: The pulmonic valve was grossly normal. Pulmonic valve regurgitation is trivial. No evidence of pulmonic stenosis.  Aorta: The aortic root and ascending aorta are structurally normal, with no evidence of dilitation.  Venous: The inferior vena cava is normal in size with less than 50% respiratory variability, suggesting right atrial pressure of 8 mmHg.  IAS/Shunts: The atrial septum is grossly normal.   LEFT VENTRICLE PLAX 2D LVIDd:         3.30 cm   Diastology LVIDs:         1.80 cm   LV e' medial:    5.98 cm/s LV PW:         1.20 cm   LV E/e' medial:  11.0 LV IVS:        1.30 cm   LV e' lateral:   9.90 cm/s LVOT diam:     2.10 cm   LV E/e' lateral: 6.7 LV SV:         62 LV SV Index:   32        2D Longitudinal Strain LVOT Area:     3.46 cm  2D Strain GLS (A2C):   -27.4 % 2D Strain GLS (A3C):   -28.2 % 2D Strain GLS (A4C):   -22.6 % 2D Strain GLS Avg:     -26.1 %  RIGHT VENTRICLE RV Basal diam:  3.30 cm RV S prime:     12.70 cm/s TAPSE (M-mode): 1.5  cm RVSP:           31.9 mmHg  LEFT ATRIUM             Index        RIGHT ATRIUM           Index LA diam:        3.60 cm 1.86 cm/m   RA Pressure: 3.00 mmHg LA Vol (A2C):   67.4 ml 34.77 ml/m  RA Area:     17.50 cm LA  Vol (A4C):   56.9 ml 29.36 ml/m  RA Volume:   50.20 ml  25.90 ml/m LA Biplane Vol: 62.6 ml 32.30 ml/m AORTIC VALVE AV Area (Vmax):    1.05 cm AV Area (Vmean):   1.03 cm AV Area (VTI):     1.00 cm AV Vmax:           291.00 cm/s AV Vmean:          193.200 cm/s AV VTI:            0.619 m AV Peak Grad:      33.9 mmHg AV Mean Grad:      18.0 mmHg LVOT Vmax:         87.95 cm/s LVOT Vmean:        57.450 cm/s LVOT VTI:          0.178 m LVOT/AV VTI ratio: 0.29  AORTA Ao Root diam: 3.40 cm Ao Asc diam:  3.60 cm  MITRAL VALVE               TRICUSPID VALVE MV Area (PHT)  cm         TR Peak grad:   28.9 mmHg MV Decel Time: 278 msec    TR Vmax:        269.00 cm/s MR Peak grad: 138.3 mmHg   Estimated RAP:  3.00 mmHg MR Mean grad: 88.0 mmHg    RVSP:           31.9 mmHg MR Vmax:      588.00 cm/s MR Vmean:     439.0 cm/s   SHUNTS MV E velocity: 65.95 cm/s  Systemic VTI:  0.18 m MV A velocity: 85.05 cm/s  Systemic Diam: 2.10 cm MV E/A ratio:  0.78  Darryle Decent MD Electronically signed by Darryle Decent MD Signature Date/Time: 10/22/2022/12:19:28 PM    Final   TEE  ECHO INTRAOPERATIVE TEE 07/23/2019  Narrative *INTRAOPERATIVE TRANSESOPHAGEAL REPORT *    Patient Name:   ZYIER DYKEMA Date of Exam: 07/23/2019 Medical Rec #:  [id number]    Height:       69.5 in Accession #:    [id number]   Weight:       158.0 lb Date of Birth:  Jul 27, 1950    BSA:          1.88 m Patient Age:    68 years     BP:           159/94 mmHg Patient Gender: M            HR:           59 bpm. Exam Location:  Inpatient  Transesophogeal exam was perform intraoperatively during surgical procedure. Patient was closely monitored under general anesthesia during the entirety  of examination.  Indications:     I35.0 Nonrheumatic aortic (valve) stenosis Performing Phys: [id number] CLARENCE H OWEN Diagnosing Phys: LYNWOOD Stabs MD  Complications: No known complications during this procedure. POST-OP IMPRESSIONS Overall, there were no significant changes from pre-bypass. - Aortic Valve: No regurgitation post repair. The gradient recorded across the prosthetic valve is within the expected range, measuring 9 cm/s. No perivalvular leak noted.  PRE-OP FINDINGS Left Ventricle: The left ventricle has normal systolic function, with an ejection fraction of 60-65%. The cavity size was normal. There is severely increased left ventricular wall thickness. No evidence of left ventricular regional wall motion abnormalities.  Right Ventricle: The right ventricle has normal systolic function. The cavity was normal. There  is no increase in right ventricular wall thickness.  Left Atrium: Left atrial size was normal in size. The left atrial appendage is well visualized and there is no evidence of thrombus present.  Right Atrium: Right atrial size was normal in size. Right atrial pressure is estimated at 10 mmHg.  Interatrial Septum: No atrial level shunt detected by color flow Doppler.  Pericardium: There is no evidence of pericardial effusion.  Mitral Valve: The mitral valve is normal in structure. Mild thickening of the mitral valve leaflet. Mild calcification of the mitral valve leaflet. Mitral valve regurgitation is mild by color flow Doppler. The MR jet is centrally-directed.  Tricuspid Valve: The tricuspid valve was normal in structure. Tricuspid valve regurgitation is trivial by color flow Doppler. The jet is directed centrally. The tricuspid valve is mildly thickened. The tricuspid valve is mildly calcified.  Aortic Valve: The aortic valve is bicuspid There is severe thickening of the aortic valve, There is severe calcifcation of the aortic valve and, with severely decreased  cusp excursion. Aortic valve regurgitation is mild by color flow Doppler. The jet is eccentric anteriorly directed. There is severe stenosis of the aortic valve. There is Severe aortic annular calcification noted.  Pulmonic Valve: The pulmonic valve was normal in structure. Pulmonic valve regurgitation is mild by color flow Doppler.   Aorta: The aortic root and ascending aorta are normal in size and structure. The aortic arch was not well visualized. There is evidence of layered immobile plaque in the descending aorta; Grade II, measuring 2-45mm in size.  +-------------+---------++ AORTIC VALVE           +-------------+---------++ AV Mean Grad:53.0 mmHg +-------------+---------++   Lynwood Stabs MD Electronically signed by Lynwood Stabs MD Signature Date/Time: 07/23/2019/4:26:48 PM    Final    CT SCANS  CT CORONARY MORPH W/CTA COR W/SCORE 06/11/2019  Addendum 06/19/2019  1:12 AM ADDENDUM REPORT: 06/19/2019 01:10  CLINICAL DATA:  73 year old male with severe aortic stenosis being evaluated prior to AVR.  EXAM: Cardiac TAVR CT  TECHNIQUE: The patient was scanned on a [employer]. A 120 kV retrospective scan was triggered in the descending thoracic aorta at 111 HU's. Gantry rotation speed was 250 msecs and collimation was .6 mm. No beta blockade or nitro were given. The 3D data set was reconstructed in 5% intervals of the R-R cycle. Systolic and diastolic phases were analyzed on a dedicated work station using MPR, MIP and VRT modes. The patient received 80 cc of contrast.  FINDINGS: Aortic Valve: Bicuspid aortic valve with severely calcified leaflets and severely restricted leaflet opening, significant calcifications are extending into the LVOT. Aortic valve calcium score 3747.  Aorta: Normal size, minimal plaque, no calcifications and no dissection.  Sinotubular Junction: 25.3 x 24.4 mm  Ascending Thoracic Aorta: 33.2 x 32.4 mm  Aortic Arch: 25.2  x 24.5 mm  Descending Thoracic Aorta: 25.9 x 23.2 mm  Sinus of Valsalva Measurements: 33.7 x 28.6 mm  Coronary Artery Height above Annulus:  Left Main: 13.4 mm  Right Coronary: 19.3 mm  Virtual Basal Annulus Measurements:  Maximum/Minimum Diameter: 27.2 x 22.7 mm  Mean Diameter: 25.0 mm  Perimeter: 80.2 mm  Area: 491 mm2  IMPRESSION: 1. Bicuspid aortic valve with severely calcified leaflets and severely restricted leaflet opening, significant calcifications are extending into the LVOT. Aortic valve calcium score 3747.  2. Sufficient coronary to annulus distance.  3. No thrombus in the left atrial appendage.   Electronically Signed By: Leim Moose On:  06/19/2019 01:10  Narrative EXAM: OVER-READ INTERPRETATION  CT CHEST  The following report is an over-read performed by radiologist Dr. Selinda Blue of [facility] on 06/11/2019. This over-read does not include interpretation of cardiac or coronary anatomy or pathology. The coronary CTA interpretation by the cardiologist is attached.  COMPARISON:  None.  FINDINGS: Please see the separate concurrent chest CT angiogram report for details.  IMPRESSION: Please see the separate concurrent chest CT angiogram report for details.  Electronically Signed: By: Selinda DELENA Blue M.D. On: 06/11/2019 11:06     ______________________________________________________________________________________________      EKG:        Recent Labs: 03/14/2023: ALT 29; BUN 24; Creatinine, Ser 1.05; Hemoglobin 14.9; Platelets 143; Potassium 3.2; Sodium 136  Recent Lipid Panel No results found for: CHOL, TRIG, HDL, CHOLHDL, VLDL, LDLCALC, LDLDIRECT   Risk Assessment/Calculations:    CHA2DS2-VASc Score = 2   This indicates a 2.2% annual risk of stroke. The patient's score is based upon: CHF History: 0 HTN History: 0 Diabetes History: 0 Stroke History: 0 Vascular Disease History: 1 Age Score:  1 Gender Score: 0          STOP-Bang Score:          Physical Exam:    VS:  BP 114/70   Pulse 60   Ht 5' 9.5 (1.765 m)   Wt 171 lb 3.2 oz (77.7 kg)   SpO2 96%   BMI 24.92 kg/m     Wt Readings from Last 3 Encounters:  11/19/23 171 lb 3.2 oz (77.7 kg)  11/04/23 166 lb 4 oz (75.4 kg)  07/29/23 171 lb (77.6 kg)     GEN:  Well nourished, well developed in no acute distress HEENT: Normal NECK: No JVD; No carotid bruits LYMPHATICS: No lymphadenopathy CARDIAC: RRR, 3/6 early-mid peaking systolic murmur best heard at the apex RESPIRATORY:  Clear to auscultation without rales, wheezing or rhonchi  ABDOMEN: Soft, non-tender, non-distended MUSCULOSKELETAL:  No edema; No deformity  SKIN: Warm and dry NEUROLOGIC:  Alert and oriented x 3 PSYCHIATRIC:  Normal affect   Assessment & Plan Persistent atrial fibrillation (HCC) Patient on apixaban , maintaining sinus rhythm at present.  He continues to have a low atrial fibrillation burden, last at 2.9% on his device interrogation.  Appears to be asymptomatic. Nonrheumatic aortic valve stenosis Patient had normal valve function on follow-up echo.  He remains asymptomatic.  Will update an echo for a 5-year study next year before his return office visit.  SBE prophylaxis when indicated. Primary hypertension Blood pressure well-controlled on amlodipine , chlorthalidone . Heart block AV complete (HCC) Status post permanent pacemaker, followed by our pacemaker clinic.            Medication Adjustments/Labs and Tests Ordered: Current medicines are reviewed at length with the patient today.  Concerns regarding medicines are outlined above.  Orders Placed This Encounter  Procedures   ECHOCARDIOGRAM COMPLETE   No orders of the defined types were placed in this encounter.   Patient Instructions  Medication Instructions:  No medication changes were made at this visit. Continue current regimen.   *If you need a refill on your cardiac  medications before your next appointment, please call your pharmacy*  Lab Work: None ordered today. If you have labs (blood work) drawn today and your tests are completely normal, you will receive your results only by: MyChart Message (if you have MyChart) OR A paper copy in the mail If you have any lab test that is abnormal  or we need to change your treatment, we will call you to review the results.  Testing/Procedures: Your physician has requested that you have an echocardiogram prior to 1 year follow-up with Dr. Wonda. Echocardiography is a painless test that uses sound waves to create images of your heart. It provides your doctor with information about the size and shape of your heart and how well your heart's chambers and valves are working. This procedure takes approximately one hour. There are no restrictions for this procedure. Please do NOT wear cologne, perfume, aftershave, or lotions (deodorant is allowed). Please arrive 15 minutes prior to your appointment time.  Please note: We ask at that you not bring children with you during ultrasound (echo/ vascular) testing. Due to room size and safety concerns, children are not allowed in the ultrasound rooms during exams. Our front office staff cannot provide observation of children in our lobby area while testing is being conducted. An adult accompanying a patient to their appointment will only be allowed in the ultrasound room at the discretion of the ultrasound technician under special circumstances. We apologize for any inconvenience.   Follow-Up: At [facility], you and your health needs are our priority.  As part of our continuing mission to provide you with exceptional heart care, our providers are all part of one team.  This team includes your primary Cardiologist (physician) and Advanced Practice Providers or APPs (Physician Assistants and Nurse Practitioners) who all work together to provide you with the care you need, when  you need it.  Your next appointment:   1 year(s)  Provider:   Ozell Wonda, MD      Signed, Ozell Wonda, MD  11/19/2023 4:53 PM    [facility]

## 2023-11-19 NOTE — Assessment & Plan Note (Signed)
 Patient on apixaban , maintaining sinus rhythm at present.  He continues to have a low atrial fibrillation burden, last at 2.9% on his device interrogation.  Appears to be asymptomatic.

## 2023-11-19 NOTE — Assessment & Plan Note (Signed)
 Blood pressure well-controlled on amlodipine , chlorthalidone .

## 2023-11-19 NOTE — Patient Instructions (Signed)
 Medication Instructions:  No medication changes were made at this visit. Continue current regimen.   *If you need a refill on your cardiac medications before your next appointment, please call your pharmacy*  Lab Work: None ordered today. If you have labs (blood work) drawn today and your tests are completely normal, you will receive your results only by: MyChart Message (if you have MyChart) OR A paper copy in the mail If you have any lab test that is abnormal or we need to change your treatment, we will call you to review the results.  Testing/Procedures: Your physician has requested that you have an echocardiogram prior to 1 year follow-up with Dr. Wonda. Echocardiography is a painless test that uses sound waves to create images of your heart. It provides your doctor with information about the size and shape of your heart and how well your heart's chambers and valves are working. This procedure takes approximately one hour. There are no restrictions for this procedure. Please do NOT wear cologne, perfume, aftershave, or lotions (deodorant is allowed). Please arrive 15 minutes prior to your appointment time.  Please note: We ask at that you not bring children with you during ultrasound (echo/ vascular) testing. Due to room size and safety concerns, children are not allowed in the ultrasound rooms during exams. Our front office staff cannot provide observation of children in our lobby area while testing is being conducted. An adult accompanying a patient to their appointment will only be allowed in the ultrasound room at the discretion of the ultrasound technician under special circumstances. We apologize for any inconvenience.   Follow-Up: At [facility], you and your health needs are our priority.  As part of our continuing mission to provide you with exceptional heart care, our providers are all part of one team.  This team includes your primary Cardiologist (physician) and  Advanced Practice Providers or APPs (Physician Assistants and Nurse Practitioners) who all work together to provide you with the care you need, when you need it.  Your next appointment:   1 year(s)  Provider:   Ozell Wonda, MD

## 2023-12-04 ENCOUNTER — Encounter: Payer: Self-pay | Admitting: Neurology

## 2023-12-04 ENCOUNTER — Other Ambulatory Visit: Payer: Self-pay | Admitting: Neurology

## 2023-12-04 ENCOUNTER — Telehealth: Payer: Self-pay | Admitting: Neurology

## 2023-12-04 MED ORDER — LEVETIRACETAM 750 MG PO TABS: 750.0000 mg | ORAL_TABLET | Freq: Two times a day (BID) | ORAL | 11 refills | Status: AC

## 2023-12-04 NOTE — Telephone Encounter (Signed)
 Called pt at [phone]. He was w/ group of friends talking yesterday around 12pm. Experienced difficulty speaking x30 min. Words would form but unable to form sentences. Went home, relaxed and sx resolved.   He did forget to take levetiracetam  500mg  yesterday morning for seizure management. Confirmed he is taking levetiracetam  500mg  po BID typically. Feels he was well hydrated yesterday. Denies any recent or current illness/infection.  Recommended he continue to monitor sx and be compliant with medications. If any more episodes occur, he should let us  know. Aware I will send to MD to review and if he has further recommendations, we will let him know.

## 2023-12-04 NOTE — Telephone Encounter (Signed)
 Pt is asking for a call to discuss a slight seizure he had yesterday around noon time.

## 2023-12-19 NOTE — Progress Notes (Signed)
 Remote PPM Transmission

## 2024-02-02 ENCOUNTER — Ambulatory Visit

## 2024-02-02 DIAGNOSIS — I4819 Other persistent atrial fibrillation: Secondary | ICD-10-CM | POA: Diagnosis not present

## 2024-02-02 LAB — CUP PACEART REMOTE DEVICE CHECK
Battery Remaining Longevity: 79 mo
Battery Remaining Percentage: 62 %
Battery Voltage: 3.01 V
Brady Statistic AP VP Percent: 6.8 %
Brady Statistic AP VS Percent: 40 %
Brady Statistic AS VP Percent: 1 %
Brady Statistic AS VS Percent: 53 %
Brady Statistic RA Percent Paced: 43 %
Brady Statistic RV Percent Paced: 7.4 %
Date Time Interrogation Session: 20251110020015
Implantable Lead Connection Status: 753985
Implantable Lead Connection Status: 753985
Implantable Lead Implant Date: 20210506
Implantable Lead Implant Date: 20210506
Implantable Lead Location: 753859
Implantable Lead Location: 753860
Implantable Pulse Generator Implant Date: 20210506
Lead Channel Impedance Value: 430 Ohm
Lead Channel Impedance Value: 450 Ohm
Lead Channel Pacing Threshold Amplitude: 0.5 V
Lead Channel Pacing Threshold Amplitude: 1.25 V
Lead Channel Pacing Threshold Pulse Width: 0.4 ms
Lead Channel Pacing Threshold Pulse Width: 0.4 ms
Lead Channel Sensing Intrinsic Amplitude: 3.8 mV
Lead Channel Sensing Intrinsic Amplitude: 3.9 mV
Lead Channel Setting Pacing Amplitude: 1.5 V
Lead Channel Setting Pacing Amplitude: 1.5 V
Lead Channel Setting Pacing Pulse Width: 0.4 ms
Lead Channel Setting Sensing Sensitivity: 0.5 mV
Pulse Gen Model: 2272
Pulse Gen Serial Number: 3825759

## 2024-02-04 ENCOUNTER — Ambulatory Visit: Payer: Self-pay | Admitting: Cardiology

## 2024-02-05 NOTE — Progress Notes (Signed)
 Remote PPM Transmission

## 2024-02-09 ENCOUNTER — Other Ambulatory Visit: Payer: Self-pay | Admitting: Cardiovascular Disease

## 2024-02-09 DIAGNOSIS — I10 Essential (primary) hypertension: Secondary | ICD-10-CM

## 2024-02-24 ENCOUNTER — Other Ambulatory Visit: Payer: Self-pay | Admitting: Neurology

## 2024-02-24 ENCOUNTER — Other Ambulatory Visit: Payer: Self-pay | Admitting: Cardiovascular Disease

## 2024-02-24 DIAGNOSIS — I4891 Unspecified atrial fibrillation: Secondary | ICD-10-CM

## 2024-02-24 NOTE — Telephone Encounter (Signed)
 Prescription refill request for Eliquis  received. Indication:afib Last office visit:8/25 Scr: 0.93  9/25 Age:73 Weight:77.7  kg  Prescription refilled

## 2024-02-25 NOTE — Telephone Encounter (Signed)
 Last seen on 07/29/23 Follow up scheduled 07/29/24  I didn't see mentioned in note pt to continue medication  Rx pending to be signed

## 2024-04-19 NOTE — Progress Notes (Signed)
" °  Electrophysiology Office Note:   Date:  04/27/2024  ID:  Jerry Foster, DOB 12-08-1950, MRN [id number]  Primary Cardiologist: Ozell Fell, MD Primary Heart Failure: None Electrophysiologist: Fonda Kitty, MD       History of Present Illness:   Jerry Foster is a 74 y.o. 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   Since last being seen in our clinic the patient reports doing well overall. He states he has dementia and he would be ok if a different health issue ended his life rather than be a babbling idiot. No issues on Eliquis .  He denies chest pain, palpitations, dyspnea, PND, orthopnea, nausea, vomiting, dizziness, syncope, edema, weight gain, or early satiety.   Review of systems complete and found to be negative unless listed in HPI.   EP Information / Studies Reviewed:    EKG is ordered today. Personal review as below.  EKG Interpretation Date/Time:  Tuesday April 27 2024 10:18:28 EST Ventricular Rate:  76 PR Interval:    QRS Duration:  160 QT Interval:  438 QTC Calculation: 492 R Axis:   85  Text Interpretation: Sinus rhythm with PAC's, intermittent AP Confirmed by Aniceto Jarvis ([id number]) on 04/27/2024 12:31:38 PM  with RBBB  PPM Interrogation-  reviewed in detail today,  See PACEART report.  Arrhythmia/Device History: Abbott Dual Chamber PPM implanted 07/29/2019 for Second Degree AV block  Physical Exam:   VS:  BP 118/80   Pulse 76   Ht 5' 9.5 (1.765 m)   Wt 162 lb (73.5 kg)   SpO2 99%   BMI 23.58 kg/m    Wt Readings from Last 3 Encounters:  04/27/24 162 lb (73.5 kg)  11/19/23 171 lb 3.2 oz (77.7 kg)  11/04/23 166 lb 4 oz (75.4 kg)     GEN: Well nourished, well developed in no acute distress NECK: No JVD; No carotid bruits CARDIAC: Regular rate and rhythm with occasional ectopy, no murmurs, rubs, gallops RESPIRATORY:  Clear to auscultation without  rales, wheezing or rhonchi  ABDOMEN: Soft, non-tender, non-distended EXTREMITIES:  No edema; No deformity   ASSESSMENT AND PLAN:    Second Degree AV block, Tachy-Brady s/p Abbott PPM  RBBB -Normal PPM function -See Pace Art report -No changes today  Persistent Atrial Fibrillation  CHA2DS2-VASc 3 -OAC for stroke prophylaxis  -2% AF burden on device  -prior discussion with Dr. Cindie regarding LAAO but with his hx of dementia and risk of general anesthesia, he wanted to think more about it. Felt to be a reasonable candidate if he wanted to proceed.   Secondary Hypercoagulable State  -continue Eliquis  5mg  BID, dose reviewed and appropriate by age / wt   Hypertension  -well controlled on current regimen    Dementia  -per PCP    Disposition:   Follow up with Dr. Kitty in 12 months > transition to new EP MD   Signed, Jarvis Aniceto, NP-C, AGACNP-BC  [facility] - Elect[facility]iology  04/27/2024, 12:35 PM  "

## 2024-04-27 ENCOUNTER — Ambulatory Visit: Admitting: Pulmonary Disease

## 2024-04-27 ENCOUNTER — Encounter: Payer: Self-pay | Admitting: Pulmonary Disease

## 2024-04-27 VITALS — BP 118/80 | HR 76 | Ht 69.5 in | Wt 162.0 lb

## 2024-04-27 DIAGNOSIS — I442 Atrioventricular block, complete: Secondary | ICD-10-CM | POA: Diagnosis not present

## 2024-04-27 DIAGNOSIS — Z950 Presence of cardiac pacemaker: Secondary | ICD-10-CM

## 2024-04-27 DIAGNOSIS — I4819 Other persistent atrial fibrillation: Secondary | ICD-10-CM

## 2024-04-27 DIAGNOSIS — D6869 Other thrombophilia: Secondary | ICD-10-CM

## 2024-04-27 LAB — CUP PACEART INCLINIC DEVICE CHECK
Battery Remaining Longevity: 80 mo
Battery Voltage: 3.01 V
Brady Statistic RA Percent Paced: 43 %
Brady Statistic RV Percent Paced: 6 %
Date Time Interrogation Session: 20260203122321
Implantable Lead Connection Status: 753985
Implantable Lead Connection Status: 753985
Implantable Lead Implant Date: 20210506
Implantable Lead Implant Date: 20210506
Implantable Lead Location: 753859
Implantable Lead Location: 753860
Implantable Pulse Generator Implant Date: 20210506
Lead Channel Impedance Value: 425 Ohm
Lead Channel Impedance Value: 437.5 Ohm
Lead Channel Pacing Threshold Amplitude: 0.625 V
Lead Channel Pacing Threshold Amplitude: 1.125 V
Lead Channel Pacing Threshold Pulse Width: 0.4 ms
Lead Channel Pacing Threshold Pulse Width: 0.4 ms
Lead Channel Sensing Intrinsic Amplitude: 3.9 mV
Lead Channel Sensing Intrinsic Amplitude: 5 mV
Lead Channel Setting Pacing Amplitude: 1.375
Lead Channel Setting Pacing Amplitude: 1.625
Lead Channel Setting Pacing Pulse Width: 0.4 ms
Lead Channel Setting Sensing Sensitivity: 0.5 mV
Pulse Gen Model: 2272
Pulse Gen Serial Number: 3825759

## 2024-04-27 NOTE — Patient Instructions (Signed)
 Medication Instructions:  No medications changes today   *If you need a refill on your cardiac medications before your next appointment, please call your pharmacy*  Lab Work: No lab work today If you have labs (blood work) drawn today and your tests are completely normal, you will receive your results only by: MyChart Message (if you have MyChart) OR A paper copy in the mail If you have any lab test that is abnormal or we need to change your treatment, we will call you to review the results.  Testing/Procedures: No testing/procedures were scheduled today  Follow-Up: At [facility], you and your health needs are our priority.  As part of our continuing mission to provide you with exceptional heart care, our providers are all part of one team.  This team includes your primary Cardiologist (physician) and Advanced Practice Providers or APPs (Physician Assistants and Nurse Practitioners) who all work together to provide you with the care you need, when you need it.  Your next appointment:   1 year(s)  Provider:   You may see Fonda Kitty, MD or one of the following Advanced Practice Providers on your designated Care Team:    Daphne Barrack, NP    We recommend signing up for the patient portal called MyChart.  Sign up information is provided on this After Visit Summary.  MyChart is used to connect with patients for Virtual Visits (Telemedicine).  Patients are able to view lab/test results, encounter notes, upcoming appointments, etc.  Non-urgent messages can be sent to your provider as well.   To learn more about what you can do with MyChart, go to [email or web address].

## 2024-05-03 ENCOUNTER — Encounter

## 2024-07-29 ENCOUNTER — Ambulatory Visit: Admitting: Neurology

## 2024-08-02 ENCOUNTER — Encounter

## 2024-11-01 ENCOUNTER — Encounter

## 2025-01-31 ENCOUNTER — Encounter

## 2025-05-02 ENCOUNTER — Encounter

## 2025-08-01 ENCOUNTER — Encounter

## 2025-10-31 ENCOUNTER — Encounter
